# Patient Record
Sex: Male | Born: 1945 | Race: White | Hispanic: No | Marital: Married | Smoking: Former smoker
Health system: Southern US, Community
[De-identification: ages and names within clinical notes are randomized; demographics above are authoritative.]

## PROBLEM LIST (undated history)

## (undated) DIAGNOSIS — K7689 Other specified diseases of liver: Secondary | ICD-10-CM

## (undated) DIAGNOSIS — I25119 Atherosclerotic heart disease of native coronary artery with unspecified angina pectoris: Secondary | ICD-10-CM

## (undated) DIAGNOSIS — N390 Urinary tract infection, site not specified: Secondary | ICD-10-CM

## (undated) DIAGNOSIS — I700 Atherosclerosis of aorta: Secondary | ICD-10-CM

## (undated) DIAGNOSIS — E785 Hyperlipidemia, unspecified: Secondary | ICD-10-CM

## (undated) DIAGNOSIS — I219 Acute myocardial infarction, unspecified: Secondary | ICD-10-CM

## (undated) DIAGNOSIS — K219 Gastro-esophageal reflux disease without esophagitis: Secondary | ICD-10-CM

## (undated) DIAGNOSIS — K824 Cholesterolosis of gallbladder: Secondary | ICD-10-CM

## (undated) DIAGNOSIS — N400 Enlarged prostate without lower urinary tract symptoms: Secondary | ICD-10-CM

## (undated) DIAGNOSIS — G4733 Obstructive sleep apnea (adult) (pediatric): Secondary | ICD-10-CM

## (undated) DIAGNOSIS — K2270 Barrett's esophagus without dysplasia: Secondary | ICD-10-CM

## (undated) DIAGNOSIS — F528 Other sexual dysfunction not due to a substance or known physiological condition: Secondary | ICD-10-CM

## (undated) DIAGNOSIS — I4949 Other premature depolarization: Secondary | ICD-10-CM

## (undated) DIAGNOSIS — M1990 Unspecified osteoarthritis, unspecified site: Secondary | ICD-10-CM

## (undated) DIAGNOSIS — E783 Hyperchylomicronemia: Secondary | ICD-10-CM

## (undated) DIAGNOSIS — R718 Other abnormality of red blood cells: Secondary | ICD-10-CM

## (undated) DIAGNOSIS — G4700 Insomnia, unspecified: Secondary | ICD-10-CM

## (undated) DIAGNOSIS — D649 Anemia, unspecified: Secondary | ICD-10-CM

## (undated) DIAGNOSIS — N508 Other specified disorders of male genital organs: Secondary | ICD-10-CM

## (undated) DIAGNOSIS — M25529 Pain in unspecified elbow: Secondary | ICD-10-CM

## (undated) DIAGNOSIS — Z8661 Personal history of infections of the central nervous system: Secondary | ICD-10-CM

## (undated) DIAGNOSIS — I10 Essential (primary) hypertension: Secondary | ICD-10-CM

## (undated) DIAGNOSIS — I77810 Thoracic aortic ectasia: Secondary | ICD-10-CM

## (undated) DIAGNOSIS — R32 Unspecified urinary incontinence: Secondary | ICD-10-CM

## (undated) DIAGNOSIS — J309 Allergic rhinitis, unspecified: Secondary | ICD-10-CM

## (undated) DIAGNOSIS — Z8679 Personal history of other diseases of the circulatory system: Secondary | ICD-10-CM

## (undated) DIAGNOSIS — Z9989 Dependence on other enabling machines and devices: Secondary | ICD-10-CM

## (undated) HISTORY — DX: Other specified disorders of male genital organs: N50.8

## (undated) HISTORY — DX: Benign prostatic hyperplasia without lower urinary tract symptoms: N40.0

## (undated) HISTORY — PX: CORONARY ANGIOPLASTY WITH STENT PLACEMENT: SHX49

## (undated) HISTORY — DX: Other sexual dysfunction not due to a substance or known physiological condition: F52.8

## (undated) HISTORY — DX: Other premature depolarization: I49.49

## (undated) HISTORY — DX: Urinary tract infection, site not specified: N39.0

## (undated) HISTORY — PX: INGUINAL HERNIA REPAIR: SUR1180

## (undated) HISTORY — DX: Hyperchylomicronemia: E78.3

## (undated) HISTORY — DX: Insomnia, unspecified: G47.00

## (undated) HISTORY — DX: Allergic rhinitis, unspecified: J30.9

## (undated) HISTORY — DX: Personal history of infections of the central nervous system: Z86.61

## (undated) HISTORY — DX: Thoracic aortic ectasia: I77.810

## (undated) HISTORY — PX: BACK SURGERY: SHX140

## (undated) HISTORY — DX: Other abnormality of red blood cells: R71.8

## (undated) HISTORY — DX: Pain in unspecified elbow: M25.529

## (undated) HISTORY — PX: TONSILLECTOMY AND ADENOIDECTOMY: SUR1326

## (undated) HISTORY — PX: CORONARY ANGIOPLASTY: SHX604

## (undated) HISTORY — DX: Unspecified urinary incontinence: R32

## (undated) HISTORY — DX: Essential (primary) hypertension: I10

## (undated) HISTORY — DX: Hyperlipidemia, unspecified: E78.5

---

## 1982-09-19 HISTORY — PX: ANKLE FRACTURE SURGERY: SHX122

## 1987-09-20 HISTORY — PX: CORONARY ARTERY BYPASS GRAFT: SHX141

## 1994-02-17 DIAGNOSIS — I219 Acute myocardial infarction, unspecified: Secondary | ICD-10-CM

## 1994-02-17 HISTORY — DX: Acute myocardial infarction, unspecified: I21.9

## 1998-08-17 ENCOUNTER — Ambulatory Visit (HOSPITAL_BASED_OUTPATIENT_CLINIC_OR_DEPARTMENT_OTHER): Admission: RE | Admit: 1998-08-17 | Discharge: 1998-08-17 | Payer: Self-pay | Admitting: Surgery

## 1998-10-06 ENCOUNTER — Encounter: Payer: Self-pay | Admitting: Cardiology

## 1998-10-06 ENCOUNTER — Inpatient Hospital Stay (HOSPITAL_COMMUNITY): Admission: EM | Admit: 1998-10-06 | Discharge: 1998-10-08 | Payer: Self-pay | Admitting: Emergency Medicine

## 2000-07-20 ENCOUNTER — Ambulatory Visit (HOSPITAL_COMMUNITY): Admission: RE | Admit: 2000-07-20 | Discharge: 2000-07-21 | Payer: Self-pay | Admitting: Cardiology

## 2001-06-25 ENCOUNTER — Inpatient Hospital Stay (HOSPITAL_COMMUNITY): Admission: EM | Admit: 2001-06-25 | Discharge: 2001-06-27 | Payer: Self-pay | Admitting: *Deleted

## 2001-06-25 ENCOUNTER — Encounter: Payer: Self-pay | Admitting: *Deleted

## 2002-03-27 ENCOUNTER — Encounter: Admission: RE | Admit: 2002-03-27 | Discharge: 2002-03-27 | Payer: Self-pay | Admitting: Orthopaedic Surgery

## 2002-03-27 ENCOUNTER — Encounter: Payer: Self-pay | Admitting: Orthopaedic Surgery

## 2002-04-08 ENCOUNTER — Encounter: Admission: RE | Admit: 2002-04-08 | Discharge: 2002-04-08 | Payer: Self-pay | Admitting: Orthopaedic Surgery

## 2002-04-08 ENCOUNTER — Encounter: Payer: Self-pay | Admitting: Orthopaedic Surgery

## 2002-04-22 ENCOUNTER — Encounter: Admission: RE | Admit: 2002-04-22 | Discharge: 2002-04-22 | Payer: Self-pay | Admitting: Orthopaedic Surgery

## 2002-04-22 ENCOUNTER — Encounter: Payer: Self-pay | Admitting: Orthopaedic Surgery

## 2002-07-05 ENCOUNTER — Ambulatory Visit (HOSPITAL_COMMUNITY): Admission: RE | Admit: 2002-07-05 | Discharge: 2002-07-05 | Payer: Self-pay | Admitting: Cardiology

## 2002-07-05 ENCOUNTER — Encounter: Payer: Self-pay | Admitting: Cardiology

## 2003-11-20 HISTORY — PX: COLONOSCOPY W/ BIOPSIES AND POLYPECTOMY: SHX1376

## 2003-11-20 LAB — HM COLONOSCOPY

## 2004-03-08 ENCOUNTER — Observation Stay (HOSPITAL_COMMUNITY): Admission: EM | Admit: 2004-03-08 | Discharge: 2004-03-09 | Payer: Self-pay | Admitting: *Deleted

## 2004-07-29 ENCOUNTER — Ambulatory Visit: Payer: Self-pay | Admitting: Cardiology

## 2004-10-07 ENCOUNTER — Ambulatory Visit: Payer: Self-pay | Admitting: Internal Medicine

## 2004-11-18 ENCOUNTER — Ambulatory Visit: Payer: Self-pay | Admitting: Internal Medicine

## 2005-06-14 ENCOUNTER — Ambulatory Visit: Payer: Self-pay

## 2005-06-29 ENCOUNTER — Ambulatory Visit: Payer: Self-pay | Admitting: Cardiology

## 2005-08-23 ENCOUNTER — Ambulatory Visit: Payer: Self-pay | Admitting: Internal Medicine

## 2005-12-30 ENCOUNTER — Ambulatory Visit: Payer: Self-pay | Admitting: Internal Medicine

## 2006-06-14 ENCOUNTER — Ambulatory Visit: Payer: Self-pay

## 2006-06-27 ENCOUNTER — Ambulatory Visit: Payer: Self-pay | Admitting: Cardiology

## 2006-08-18 ENCOUNTER — Ambulatory Visit: Payer: Self-pay | Admitting: Internal Medicine

## 2006-09-25 ENCOUNTER — Ambulatory Visit: Payer: Self-pay | Admitting: Internal Medicine

## 2007-06-20 ENCOUNTER — Ambulatory Visit: Payer: Self-pay

## 2007-06-22 ENCOUNTER — Ambulatory Visit: Payer: Self-pay | Admitting: Cardiology

## 2007-08-20 ENCOUNTER — Ambulatory Visit: Payer: Self-pay | Admitting: Internal Medicine

## 2007-08-20 DIAGNOSIS — I10 Essential (primary) hypertension: Secondary | ICD-10-CM

## 2007-08-20 DIAGNOSIS — I4949 Other premature depolarization: Secondary | ICD-10-CM

## 2007-08-20 DIAGNOSIS — J309 Allergic rhinitis, unspecified: Secondary | ICD-10-CM

## 2007-08-20 DIAGNOSIS — E781 Pure hyperglyceridemia: Secondary | ICD-10-CM

## 2007-08-20 DIAGNOSIS — F528 Other sexual dysfunction not due to a substance or known physiological condition: Secondary | ICD-10-CM

## 2007-08-20 DIAGNOSIS — I25810 Atherosclerosis of coronary artery bypass graft(s) without angina pectoris: Secondary | ICD-10-CM | POA: Insufficient documentation

## 2007-08-20 DIAGNOSIS — N400 Enlarged prostate without lower urinary tract symptoms: Secondary | ICD-10-CM

## 2007-08-20 DIAGNOSIS — M545 Low back pain: Secondary | ICD-10-CM

## 2007-08-20 HISTORY — DX: Allergic rhinitis, unspecified: J30.9

## 2007-08-20 HISTORY — DX: Essential (primary) hypertension: I10

## 2007-08-20 HISTORY — DX: Other sexual dysfunction not due to a substance or known physiological condition: F52.8

## 2007-08-20 HISTORY — DX: Benign prostatic hyperplasia without lower urinary tract symptoms: N40.0

## 2007-08-20 HISTORY — DX: Other premature depolarization: I49.49

## 2007-09-20 HISTORY — PX: SHOULDER OPEN ROTATOR CUFF REPAIR: SHX2407

## 2008-03-17 ENCOUNTER — Ambulatory Visit: Payer: Self-pay | Admitting: Internal Medicine

## 2008-03-17 DIAGNOSIS — G4700 Insomnia, unspecified: Secondary | ICD-10-CM

## 2008-03-17 HISTORY — DX: Insomnia, unspecified: G47.00

## 2008-04-15 ENCOUNTER — Ambulatory Visit: Payer: Self-pay | Admitting: Internal Medicine

## 2008-04-15 DIAGNOSIS — M25529 Pain in unspecified elbow: Secondary | ICD-10-CM

## 2008-04-15 HISTORY — DX: Pain in unspecified elbow: M25.529

## 2008-06-23 ENCOUNTER — Ambulatory Visit: Payer: Self-pay

## 2008-07-01 ENCOUNTER — Ambulatory Visit: Payer: Self-pay | Admitting: Cardiology

## 2008-09-03 ENCOUNTER — Ambulatory Visit: Payer: Self-pay | Admitting: Cardiology

## 2008-09-03 ENCOUNTER — Encounter: Payer: Self-pay | Admitting: Physician Assistant

## 2008-09-03 DIAGNOSIS — E783 Hyperchylomicronemia: Secondary | ICD-10-CM

## 2008-09-15 ENCOUNTER — Ambulatory Visit (HOSPITAL_COMMUNITY): Admission: RE | Admit: 2008-09-15 | Discharge: 2008-09-16 | Payer: Self-pay | Admitting: Orthopedic Surgery

## 2008-10-09 ENCOUNTER — Ambulatory Visit: Payer: Self-pay | Admitting: Internal Medicine

## 2008-10-09 ENCOUNTER — Telehealth: Payer: Self-pay | Admitting: Internal Medicine

## 2008-10-09 DIAGNOSIS — N508 Other specified disorders of male genital organs: Secondary | ICD-10-CM

## 2008-10-09 DIAGNOSIS — R350 Frequency of micturition: Secondary | ICD-10-CM

## 2008-10-09 DIAGNOSIS — R32 Unspecified urinary incontinence: Secondary | ICD-10-CM | POA: Insufficient documentation

## 2008-10-09 HISTORY — DX: Other specified disorders of male genital organs: N50.8

## 2008-10-09 HISTORY — DX: Unspecified urinary incontinence: R32

## 2008-10-10 LAB — CONVERTED CEMR LAB
ALT: 30 units/L (ref 0–53)
AST: 25 units/L (ref 0–37)
Albumin: 3.8 g/dL (ref 3.5–5.2)
Alkaline Phosphatase: 73 units/L (ref 39–117)
BUN: 17 mg/dL (ref 6–23)
Basophils Absolute: 0.1 10*3/uL (ref 0.0–0.1)
Basophils Relative: 0.9 % (ref 0.0–3.0)
Bilirubin Urine: NEGATIVE
Bilirubin, Direct: 0.1 mg/dL (ref 0.0–0.3)
CO2: 28 meq/L (ref 19–32)
Calcium: 8.8 mg/dL (ref 8.4–10.5)
Chloride: 106 meq/L (ref 96–112)
Cholesterol: 123 mg/dL (ref 0–200)
Creatinine, Ser: 0.9 mg/dL (ref 0.4–1.5)
Eosinophils Absolute: 0.6 10*3/uL (ref 0.0–0.7)
Eosinophils Relative: 7.9 % — ABNORMAL HIGH (ref 0.0–5.0)
GFR calc Af Amer: 110 mL/min
GFR calc non Af Amer: 91 mL/min
Glucose, Bld: 95 mg/dL (ref 70–99)
HCT: 45.8 % (ref 39.0–52.0)
HDL: 33 mg/dL — ABNORMAL LOW (ref >39.0)
Hemoglobin: 15.4 g/dL (ref 13.0–17.0)
Ketones, ur: NEGATIVE mg/dL
LDL Cholesterol: 66 mg/dL (ref 0–99)
Leukocytes, UA: NEGATIVE
Lymphocytes Relative: 24.7 % (ref 12.0–46.0)
MCHC: 33.7 g/dL (ref 30.0–36.0)
MCV: 79.6 fL (ref 78.0–100.0)
Monocytes Absolute: 0.9 10*3/uL (ref 0.1–1.0)
Monocytes Relative: 12 % (ref 3.0–12.0)
Neutro Abs: 3.7 10*3/uL (ref 1.4–7.7)
Neutrophils Relative %: 54.5 % (ref 43.0–77.0)
Nitrite: NEGATIVE
PSA: 2.83 ng/mL (ref 0.10–4.00)
Platelets: 173 10*3/uL (ref 150–400)
Potassium: 4 meq/L (ref 3.5–5.1)
RBC: 5.75 M/uL (ref 4.22–5.81)
RDW: 12.6 % (ref 11.5–14.6)
Sodium: 140 meq/L (ref 135–145)
Specific Gravity, Urine: 1.02 (ref 1.000–1.03)
TSH: 1.17 microintl units/mL (ref 0.35–5.50)
Total Bilirubin: 0.7 mg/dL (ref 0.3–1.2)
Total CHOL/HDL Ratio: 3.7
Total Protein, Urine: NEGATIVE mg/dL
Total Protein: 6.6 g/dL (ref 6.0–8.3)
Triglycerides: 119 mg/dL (ref 0–149)
Urine Glucose: NEGATIVE mg/dL
Urobilinogen, UA: 0.2 (ref 0.0–1.0)
VLDL: 24 mg/dL (ref 0–40)
WBC: 7.1 10*3/uL (ref 4.5–10.5)
pH: 5.5 (ref 5.0–8.0)

## 2008-10-29 ENCOUNTER — Encounter: Payer: Self-pay | Admitting: Internal Medicine

## 2008-10-30 ENCOUNTER — Encounter: Admission: RE | Admit: 2008-10-30 | Discharge: 2008-12-10 | Payer: Self-pay | Admitting: Orthopedic Surgery

## 2008-10-30 ENCOUNTER — Telehealth (INDEPENDENT_AMBULATORY_CARE_PROVIDER_SITE_OTHER): Payer: Self-pay | Admitting: *Deleted

## 2008-10-30 ENCOUNTER — Encounter (INDEPENDENT_AMBULATORY_CARE_PROVIDER_SITE_OTHER): Payer: Self-pay | Admitting: *Deleted

## 2009-04-22 ENCOUNTER — Encounter: Payer: Self-pay | Admitting: Cardiology

## 2009-04-22 ENCOUNTER — Encounter: Payer: Self-pay | Admitting: Internal Medicine

## 2009-04-24 ENCOUNTER — Ambulatory Visit: Payer: Self-pay | Admitting: Internal Medicine

## 2009-04-24 DIAGNOSIS — N390 Urinary tract infection, site not specified: Secondary | ICD-10-CM

## 2009-04-24 HISTORY — DX: Urinary tract infection, site not specified: N39.0

## 2009-04-24 LAB — CONVERTED CEMR LAB
Ketones, ur: NEGATIVE mg/dL
Nitrite: NEGATIVE
Specific Gravity, Urine: 1.015 (ref 1.000–1.030)
Urobilinogen, UA: 1 (ref 0.0–1.0)
pH: 6 (ref 5.0–8.0)

## 2009-04-25 ENCOUNTER — Encounter: Payer: Self-pay | Admitting: Internal Medicine

## 2009-04-27 ENCOUNTER — Encounter: Payer: Self-pay | Admitting: Cardiology

## 2009-05-01 ENCOUNTER — Telehealth: Payer: Self-pay | Admitting: Cardiology

## 2009-05-28 ENCOUNTER — Telehealth (INDEPENDENT_AMBULATORY_CARE_PROVIDER_SITE_OTHER): Payer: Self-pay | Admitting: *Deleted

## 2009-06-01 ENCOUNTER — Ambulatory Visit: Payer: Self-pay

## 2009-06-01 ENCOUNTER — Encounter: Payer: Self-pay | Admitting: Internal Medicine

## 2009-06-23 ENCOUNTER — Ambulatory Visit: Payer: Self-pay | Admitting: Cardiology

## 2009-08-03 ENCOUNTER — Telehealth (INDEPENDENT_AMBULATORY_CARE_PROVIDER_SITE_OTHER): Payer: Self-pay | Admitting: *Deleted

## 2009-08-07 ENCOUNTER — Ambulatory Visit (HOSPITAL_BASED_OUTPATIENT_CLINIC_OR_DEPARTMENT_OTHER): Admission: RE | Admit: 2009-08-07 | Discharge: 2009-08-07 | Payer: Self-pay | Admitting: Urology

## 2009-08-07 HISTORY — PX: CYSTOSCOPY: SUR368

## 2009-08-19 ENCOUNTER — Ambulatory Visit: Payer: Self-pay | Admitting: Internal Medicine

## 2009-08-19 DIAGNOSIS — R58 Hemorrhage, not elsewhere classified: Secondary | ICD-10-CM | POA: Insufficient documentation

## 2009-10-02 ENCOUNTER — Ambulatory Visit (HOSPITAL_BASED_OUTPATIENT_CLINIC_OR_DEPARTMENT_OTHER): Admission: RE | Admit: 2009-10-02 | Discharge: 2009-10-03 | Payer: Self-pay | Admitting: Urology

## 2009-10-02 HISTORY — PX: TRANSURETHRAL RESECTION OF PROSTATE: SHX73

## 2010-01-27 ENCOUNTER — Encounter: Payer: Self-pay | Admitting: Cardiology

## 2010-01-28 ENCOUNTER — Telehealth (INDEPENDENT_AMBULATORY_CARE_PROVIDER_SITE_OTHER): Payer: Self-pay | Admitting: *Deleted

## 2010-02-22 ENCOUNTER — Encounter: Payer: Self-pay | Admitting: Internal Medicine

## 2010-02-25 ENCOUNTER — Encounter: Payer: Self-pay | Admitting: Cardiology

## 2010-03-08 ENCOUNTER — Telehealth: Payer: Self-pay | Admitting: Cardiology

## 2010-03-08 ENCOUNTER — Encounter: Payer: Self-pay | Admitting: Cardiovascular Disease

## 2010-06-03 ENCOUNTER — Telehealth (INDEPENDENT_AMBULATORY_CARE_PROVIDER_SITE_OTHER): Payer: Self-pay | Admitting: *Deleted

## 2010-06-10 ENCOUNTER — Telehealth (INDEPENDENT_AMBULATORY_CARE_PROVIDER_SITE_OTHER): Payer: Self-pay | Admitting: *Deleted

## 2010-06-14 ENCOUNTER — Ambulatory Visit: Payer: Self-pay | Admitting: Internal Medicine

## 2010-06-14 ENCOUNTER — Encounter: Payer: Self-pay | Admitting: Internal Medicine

## 2010-06-14 ENCOUNTER — Ambulatory Visit: Payer: Self-pay

## 2010-06-14 ENCOUNTER — Encounter (HOSPITAL_COMMUNITY): Admission: RE | Admit: 2010-06-14 | Discharge: 2010-08-23 | Payer: Self-pay

## 2010-06-22 ENCOUNTER — Encounter: Payer: Self-pay | Admitting: Cardiology

## 2010-06-24 ENCOUNTER — Telehealth: Payer: Self-pay | Admitting: Internal Medicine

## 2010-06-25 ENCOUNTER — Ambulatory Visit: Payer: Self-pay | Admitting: Cardiology

## 2010-06-28 ENCOUNTER — Encounter: Payer: Self-pay | Admitting: Internal Medicine

## 2010-07-29 ENCOUNTER — Telehealth: Payer: Self-pay | Admitting: Cardiology

## 2010-09-19 HISTORY — PX: SURGERY SCROTAL / TESTICULAR: SUR1316

## 2010-09-19 HISTORY — PX: FOOT TENDON SURGERY: SHX958

## 2010-10-21 NOTE — Letter (Signed)
Summary: [facility] Office Note  [facility] Office Note   Imported By: Roderic Ovens 02/25/2010 15:49:59  _____________________________________________________________________  External Attachment:    Type:   Image     Comment:   External Document

## 2010-10-21 NOTE — Progress Notes (Signed)
Summary: clearance to be off plavix and asa   Phone Note From Other Clinic   Caller: Glendon Axe   [phone] ext [phone]   fax [phone] Summary of Call: Glendon Axe w/[name] called stating pt is sch for ankle surgery on 6/28 and they need clearance for him to be off of Plavix and ASA for 5 days prior, advised Dr Juanda Chance is on vaction will send to DOD Dr Shirlee Latch for review  Initial call taken by: Meredith Staggers, RN,  March 08, 2010 2:39 PM  Follow-up for Phone Call        Discussed info and chart reviewed by Dr Excell Seltzer, ok for pt to come off Plavix 5 days prior, would prefer pt cont. ASA if possible, LuAnne aware note faxed  Follow-up by: Meredith Staggers, RN,  March 08, 2010 2:45 PM

## 2010-10-21 NOTE — Miscellaneous (Signed)
  Clinical Lists Changes  Observations: Added new observation of NUCLEAR NOS:  Overall Impression   Exercise Capacity: Lexiscan with no exercise. BP Response: Normal blood pressure response. Clinical Symptoms: Mild chest pain/dyspnea. ECG Impression: No significant ST segment change suggestive of ischemia. Overall Impression: Probable normal perfusion and soft tissue attenuation, cannot exclude subendocardial scar.  No evidence for ischemia.  (06/14/2010 10:28)      Nuclear Study  Procedure date:  06/14/2010  Findings:       Overall Impression   Exercise Capacity: Lexiscan with no exercise. BP Response: Normal blood pressure response. Clinical Symptoms: Mild chest pain/dyspnea. ECG Impression: No significant ST segment change suggestive of ischemia. Overall Impression: Probable normal perfusion and soft tissue attenuation, cannot exclude subendocardial scar.  No evidence for ischemia.

## 2010-10-21 NOTE — Progress Notes (Signed)
Summary: Nuc Pre-Procedure  Phone Note Outgoing Call Call back at [facility] Phone [phone]   Call placed by: Antionette Char RN,  June 03, 2010 4:13 PM Call placed to: Patient Reason for Call: Confirm/change Appt Summary of Call: Left message with information on Myoview Information Sheet (see scanned document for details).     Nuclear Med Background Indications for Stress Test: Evaluation for Ischemia, Graft Patency, Stent Patency  Indications Comments: Pending   History: Asthma, CABG, Heart Catheterization, Myocardial Infarction, Myocardial Perfusion Study, Stents  History Comments: '89 CABG x3  '95 MI 9/10 MPS Normal  Symptoms: Palpitations    Nuclear Pre-Procedure Cardiac Risk Factors: Family History - CAD, History of Smoking, Hypertension, Lipids Height (in): 71  Nuclear Med Study Referring MD:  B.Juanda Chance

## 2010-10-21 NOTE — Medication Information (Signed)
Summary: Physician's Orders   Physician's Orders   Imported By: Roderic Ovens 03/17/2010 14:32:39  _____________________________________________________________________  External Attachment:    Type:   Image     Comment:   External Document

## 2010-10-21 NOTE — Progress Notes (Signed)
Summary: Schedule office visit to discuss colonoscopy   Phone Note Outgoing Call Call back at Home Phone [phone]   Call placed by: Christie Nottingham CMA Duncan Dull),  Jan 28, 2010 2:56 PM Call placed to: Patient Summary of Call: Called pt to schedule office visit to discuss recall colonoscopy and pt states he just had surgery this year and wants have some of his medical problems worked out before he comes into our office but will call back to schedule offic visit.  Initial call taken by: Christie Nottingham CMA Duncan Dull),  Jan 28, 2010 2:58 PM

## 2010-10-21 NOTE — Letter (Signed)
Summary: Dept of [facility]  Dept of [facility]   Imported By: Marylou Mccoy 07/09/2010 14:22:22  _____________________________________________________________________  External Attachment:    Type:   Image     Comment:   External Document

## 2010-10-21 NOTE — Miscellaneous (Signed)
Summary: Appointment Canceled  Appointment status changed to canceled by [employer] on 06/04/2010 2:06 PM.  Cancellation Comments --------------------- aden r/s myoview/hm  Appointment Information ----------------------- Appt Type:  CARDIOLOGY NUCLEAR TESTING      Date:  Monday, June 07, 2010      Time:  7:30 AM for 15 min   Urgency:  Routine   Made By:  Pearson Grippe  To Visit:  [id number]    Reason:  aden r/s myoview/hm  Appt Comments ------------- -- 06/04/10 14:06: ([facility]) CANCELED -- aden r/s myoview/hm -- 06/04/10 10:38: ([facility]) BOOKED -- Routine CARDIOLOGY NUCLEAR TESTING at 06/07/2010 7:30 AM for 15 min aden r/s myoview/hm -- 05/25/10 9:14: ([facility]) BOOKED -- Routine CARDIOLOGY NUCLEAR TESTING a

## 2010-10-21 NOTE — Assessment & Plan Note (Signed)
Summary: Cardiology Nuclear Testing  Nuclear Med Background Indications for Stress Test: Evaluation for Ischemia, Graft Patency, Stent Patency  Indications Comments: .  History: Angioplasty, Asthma, CABG, Heart Catheterization, Myocardial Infarction, Myocardial Perfusion Study, Stents  History Comments: 9/10 MPS Normal  Symptoms: Palpitations    Nuclear Pre-Procedure Cardiac Risk Factors: Family History - CAD, History of Smoking, Hypertension, Lipids Caffeine/Decaff Intake: None NPO After: 9:00 PM Lungs: clear IV 0.9% NS with Angio Cath: 22g     IV Site: R Antecubital IV Started by: Irean Hong, RN Chest Size (in) 46     Height (in): 71 Weight (lb): 239 BMI: 33.45 Tech Comments: Took atenolol last night.  Nuclear Med Study 1 or 2 day study:  1 day     Stress Test Type:  Treadmill/Lexiscan Reading MD:  Dietrich Pates, MD     Referring MD:  B.Brodie Resting Radionuclide:  Technetium 62m Tetrofosmin     Resting Radionuclide Dose:  11.0 mCi  Stress Radionuclide:  Technetium 75m Tetrofosmin     Stress Radionuclide Dose:  33.0 mCi   Stress Protocol  Max Systolic BP: 156 mm Hg Lexiscan: 0.4 mg   Stress Test Technologist:  Milana Na, EMT-P     Nuclear Technologist:  Domenic Polite, CNMT  Rest Procedure  Myocardial perfusion imaging was performed at rest 45 minutes following the intravenous administration of Technetium 88m Tetrofosmin.  Stress Procedure  The patient received IV Lexiscan 0.4 mg over 15-seconds with concurrent low level exercise and then Technetium 34m Tetrofosmin was injected at 30-seconds while the patient continued walking one more minute.  There were no significant changes with Lexiscan.  Quantitative spect images were obtained after a 45 minute delay.  QPS Raw Data Images:  Soft tissue (diaphragm, subcutaneous fat) surround heart. Stress Images:  Thinning withdecreased counts in the inferior wall (base, minimally mid); inferolateral wall (base) and  apex.  Otherwise normal perfusion. Rest Images:  No significant change from the stress images. Transient Ischemic Dilatation:  1.10  (Normal <1.22)  Lung/Heart Ratio:  .32  (Normal <0.45)  Quantitative Gated Spect Images QGS EDV:  106 ml QGS ESV:  42 ml QGS EF:  60 %   Overall Impression  Exercise Capacity: Lexiscan with no exercise. BP Response: Normal blood pressure response. Clinical Symptoms: Mild chest pain/dyspnea. ECG Impression: No significant ST segment change suggestive of ischemia. Overall Impression: Probable normal perfusion and soft tissue attenuation, cannot exclude subendocardial scar.  No evidence for ischemia.  Appended Document: Cardiology Nuclear Testing Appt 10/7 with Dr. Juanda Chance.  Appended Document: Cardiology Nuclear Testing Pt made aware of his results at his office visit today.

## 2010-10-21 NOTE — Progress Notes (Signed)
Summary:  anti-inflammatory meds   Phone Note From Other Clinic Call back at [facility] Phone [phone]   Caller: Patient Caller: beth office [phone] Request: Talk with Nurse Details of Complaint: pt need clearance - for anti inflammatory meds- for hip arthritic pt on plavix. pls advise.  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 They are requesting that Dr.Caragh Gasper review and call them back on 11/14 with a recommendation. I offered to speak with the DOD but they were OK with waiting to speak with Dr.Casmere Hollenbeck next week.  Layne Benton, RN, BSN  July 29, 2010 11:14 AM   Additional Follow-up for Phone Call Additional follow up Details #1::        I discussed the above with Dr. Juanda Chance. Per Dr. Juanda Chance- he would recommend celebrex due to less GI side effects, but if cost is an issure, indocin is ok. I will contact Dr. Jeannetta [name] office tomorrow. Additional Follow-up by: Sherri Rad, RN, BSN,  August 04, 2010 6:18 PM    Additional Follow-up for Phone Call Additional follow up Details #2::    I called and spoke with Tammy at Dr. Jeannetta [name] office and made her aware of Dr. Regino Schultze recommendations regarding celebrex and indocin. She verbalizes understanding.  Follow-up by: Sherri Rad, RN, BSN,  August 05, 2010 10:31 AM

## 2010-10-21 NOTE — Letter (Signed)
Summary: [facility]   Imported By: Lester Farnham 07/13/2010 08:31:50  _____________________________________________________________________  External Attachment:    Type:   Image     Comment:   External Document

## 2010-10-21 NOTE — Letter (Signed)
Summary: [facility]   Imported By: Sherian Rein 02/25/2010 13:14:09  _____________________________________________________________________  External Attachment:    Type:   Image     Comment:   External Document

## 2010-10-21 NOTE — Assessment & Plan Note (Signed)
Summary: f1y  Medications Added SIMVASTATIN 80 MG TABS (SIMVASTATIN) Take 1/2  tablet by mouth daily at bedtime AMITRIPTYLINE HCL 25 MG TABS (AMITRIPTYLINE HCL) take one tab by mouth once daily PROAIR HFA 108 (90 BASE) MCG/ACT  AERS (ALBUTEROL SULFATE) use as needed      Allergies Added: ! * METOPROLOL  Visit Type:  Follow-up- 1 year Primary Javious Hallisey:  Corwin Levins MD  CC:  No cardiac complaints. Pt. is pending an appt. on monday with Dr. Rayburn Ma for possible hernia surgery.Marland Kitchen  History of Present Illness: Mr. Iyer is 65 years old and return for management of CAD. He had bypass surgery in 1989 in [place]. He had a DMI sometime after that treated with PTCA of the saphenous vein graft to the right coronary artery. In 1996 he had a stent to the saphenous vein graft to the right coronary and in 2000 he had another stent for peri-stent restenosis. His last catheterization was in 2005 at which time he had no major obstructive disease.  He has done well since that time has had no symptoms of chest pain chores breath or palpitations. Had a Myoview scan prior to this visit which showed no ischemia and an ejection fraction of 60%.  His other problems include hypertension hyperlipidemia.  He has a hernia that he thinks will need repair and he is scheduled to see Dr. Magnus Ivan next week.  Current Medications (verified): 1)  Clinoril 200 Mg  Tabs (Sulindac) .Marland Kitchen.. 1 By Mouth Bid 2)  Plavix 75 Mg  Tabs (Clopidogrel Bisulfate) .Marland Kitchen.. 1 By Mouth Qd 3)  Atenolol 25 Mg  Tabs (Atenolol) .Marland Kitchen.. 1 By Mouth Qd 4)  Simvastatin 80 Mg Tabs (Simvastatin) .... Take 1/2  Tablet By Mouth Daily At Bedtime 5)  Omeprazole 20 Mg  Tbec (Omeprazole) .Marland Kitchen.. 1 By Mouth Qd 6)  Ecotrin Low Strength 81 Mg  Tbec (Aspirin) .Marland Kitchen.. 1po Qd 7)  Amitriptyline Hcl 25 Mg Tabs (Amitriptyline Hcl) .... Take One Tab By Mouth Once Daily 8)  Proair Hfa 108 (90 Base) Mcg/act  Aers (Albuterol Sulfate) .... Use As Needed 9)  Temazepam 30 Mg   Caps (Temazepam) .... Take As Needed For Sleep  Allergies (verified): 1)  ! * Metoprolol  Past History:  Past Medical History: Reviewed history from 09/03/2008 and no changes required. Coronary artery disease CABG 1989 [place] DMI treated PTCA distal SVG-RCA 1995 Stent ostium SVG-RCA 1996 PTCA ostial SvG-RCA for restenosis 2000 Stent native CFX 2001 Last cath 2002 nonobstructive disease inferobasal HK EF 60% Hyperlipidemia Hypertension DJD E.D. hx of symptomatic pvc's Myocardial infarction, hx of - 1995 Allergic rhinitis Benign prostatic hypertrophy hx of meningitis Low back pain - hx of TENS use  Review of Systems       ROS is negative except as outlined in HPI.   Vital Signs:  Patient profile:   65 year old male Height:      72 inches Weight:      241.50 pounds Pulse rate:   55 / minute Pulse rhythm:   regular BP sitting:   130 / 78  (left arm) Cuff size:   large  Vitals Entered By: Sherri Rad, RN, BSN (June 25, 2010 8:49 AM)  Physical Exam  Additional Exam:  Gen. Well-nourished, in no distress   Neck: No JVD, thyroid not enlarged, no carotid bruits Lungs: No tachypnea, clear without rales, rhonchi or wheezes Cardiovascular: Rhythm regular, PMI not displaced,  heart sounds  normal, no murmurs or gallops, no peripheral  edema, pulses normal in all 4 extremities. Abdomen: BS normal, abdomen soft and non-tender without masses or organomegaly, no hepatosplenomegaly. MS: No deformities, no cyanosis or clubbing   Neuro:  No focal sns   Skin:  no lesions    Impression & Recommendations:  Problem # 1:  CORONARY ARTERY DISEASE (ICD-414.00) He had bypass surgery in 1989 and multiple PCI procedures. He had a recent negative Myoview scan and has had no symptoms. His thumb appears stable. His updated medication list for this problem includes:    Plavix 75 Mg Tabs (Clopidogrel bisulfate) .Marland Kitchen... 1 by mouth qd    Atenolol 25 Mg Tabs (Atenolol) .Marland Kitchen... 1 by mouth  qd    Ecotrin Low Strength 81 Mg Tbec (Aspirin) .Marland Kitchen... 1po qd  Problem # 2:  PREOPERATIVE EXAMINATION (ICD-V72.84) He is scheduled to see Dr. Magnus Ivan next week for possible hernia surgery. His cardiac condition is stable I think he is okay from a cardiac standpoint to proceed with this. It is been a long time since he had stents implanted and I think it is okay for him to come off Plavix 5 days prior to the surgery. I would prefer that he stay on aspirin through the surgery.  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 His total cholesterol was 172, his LDL was 105, his HDL was 46. He's not quite at target with his LDL and we will encourage tightening up on the diet. His updated medication list for this problem includes:    Simvastatin 80 Mg Tabs (Simvastatin) .Marland Kitchen... Take 1/2  tablet by mouth daily at bedtime  Problem # 4:  HYPERTENSION (ICD-401.9) His blood pressure is well controlled on current medications. His updated medication list for this problem includes:    Atenolol 25 Mg Tabs (Atenolol) .Marland Kitchen... 1 by mouth qd    Ecotrin Low Strength 81 Mg Tbec (Aspirin) .Marland Kitchen... 1po qd  Other Orders: EKG w/ Interpretation (93000)  Patient Instructions: 1)  Your physician recommends that you continue on your current medications as directed. Please refer to the Current Medication list given to you today. 2)  Your physician wants you to follow-up in: 1 year with Dr. Excell Seltzer.  You will receive a reminder letter in the mail two months in advance. If you don't receive a letter, please call our office to schedule the follow-up appointment.

## 2010-10-21 NOTE — Progress Notes (Signed)
  Phone Note Call from Patient Call back at Home Phone [phone]   Summary of Call: Patient is requesting referral to a surgeon for hernia repair.  Initial call taken by: Lamar Sprinkles, CMA,  June 24, 2010 11:10 AM  Follow-up for Phone Call        not seen since dec 2010  needs OV  - not sure if this is needed or not Follow-up by: Corwin Levins MD,  June 24, 2010 1:35 PM  Additional Follow-up for Phone Call Additional follow up Details #1::        Pt states he did not ask for referral and he "found out what he needed to know" Additional Follow-up by: Margaret Pyle, CMA,  June 24, 2010 1:43 PM

## 2010-10-21 NOTE — Progress Notes (Signed)
Summary: Nuc Pre-Procedure  Phone Note Outgoing Call Call back at [facility] Phone [phone]   Call placed by: Stanton Kidney, EMT-P,  June 10, 2010 2:15 PM Call placed to: Patient Action Taken: Phone Call Completed Summary of Call: Reviewed information on Myoview Information Sheet (see scanned document for further details).  Spoke with the Patient.     Nuclear Med Background Indications for Stress Test: Evaluation for Ischemia, Graft Patency, Stent Patency  Indications Comments: .  History: Angioplasty, Asthma, CABG, Heart Catheterization, Myocardial Infarction, Myocardial Perfusion Study, Stents  History Comments: 9/10 MPS Normal  Symptoms: Palpitations    Nuclear Pre-Procedure Cardiac Risk Factors: Family History - CAD, History of Smoking, Hypertension, Lipids Height (in): 71  Nuclear Med Study Referring MD:  B.Juanda Chance

## 2010-12-05 LAB — BASIC METABOLIC PANEL
CO2: 28 mEq/L (ref 19–32)
Calcium: 8.3 mg/dL — ABNORMAL LOW (ref 8.4–10.5)
GFR calc Af Amer: >60 mL/min (ref >60)
Glucose, Bld: 160 mg/dL — ABNORMAL HIGH (ref 70–99)
Potassium: 4.3 mEq/L (ref 3.5–5.1)
Sodium: 138 mEq/L (ref 135–145)

## 2010-12-05 LAB — DIFFERENTIAL
Basophils Relative: 0 % (ref 0–1)
Eosinophils Absolute: 0 10*3/uL (ref 0.0–0.7)
Eosinophils Relative: 0 % (ref 0–5)
Eosinophils Relative: 0 % (ref 0–5)
Lymphocytes Relative: 10 % — ABNORMAL LOW (ref 12–46)
Lymphs Abs: 0.9 10*3/uL (ref 0.7–4.0)
Lymphs Abs: 1.7 10*3/uL (ref 0.7–4.0)
Monocytes Absolute: 0.7 10*3/uL (ref 0.1–1.0)
Monocytes Relative: 1 % — ABNORMAL LOW (ref 3–12)
Monocytes Relative: 4 % (ref 3–12)
Neutrophils Relative %: 88 % — ABNORMAL HIGH (ref 43–77)

## 2010-12-05 LAB — CBC
HCT: 39 % (ref 39.0–52.0)
HCT: 44 % (ref 39.0–52.0)
Hemoglobin: 12.9 g/dL — ABNORMAL LOW (ref 13.0–17.0)
Hemoglobin: 14.4 g/dL (ref 13.0–17.0)
MCHC: 32.7 g/dL (ref 30.0–36.0)
MCV: 76.6 fL — ABNORMAL LOW (ref 78.0–100.0)
RBC: 5.72 MIL/uL (ref 4.22–5.81)
RDW: 15.2 % (ref 11.5–15.5)
WBC: 17.1 10*3/uL — ABNORMAL HIGH (ref 4.0–10.5)

## 2010-12-05 LAB — POCT I-STAT 4, (NA,K, GLUC, HGB,HCT)
HCT: 47 % (ref 39.0–52.0)
Hemoglobin: 16 g/dL (ref 13.0–17.0)

## 2010-12-22 LAB — POCT I-STAT 4, (NA,K, GLUC, HGB,HCT)
HCT: 49 % (ref 39.0–52.0)
Hemoglobin: 16.7 g/dL (ref 13.0–17.0)

## 2011-02-01 NOTE — Assessment & Plan Note (Signed)
[facility]                            CARDIOLOGY OFFICE NOTE   HORATIO, BERTZ                    MRN:          [id number]  DATE:06/22/2007                            DOB:          Feb 02, 1946    PRIMARY CARE PHYSICIAN:  Corwin Levins, M.D.   CLINICAL HISTORY:  Mr. Rivere is 64 years old and returned for  followup management of his coronary heart disease.  He had bypass  surgery in 1989 and had a myocardial infarction treated with PCI and  distal anastomosis with a vein graft to the right coronary artery and  subsequently had a thinning of the proximal right coronary artery and  the proximal circumflex artery.  His last catheterization was in 2005,  at which time he had a patent LIMA to LAD, a patent vein graft to the  right and a patent native circumflex artery with stents in the  circumflex and vein graft to the right.  His LV function is good.   He says he has been doing quite well over the past year with no chest  pain, shortness of breath or palpitations.  We did a Myoview scan prior  to this visit and it showed no evidence of ischemia and good LV  function.   PAST MEDICAL HISTORY:  Significant for hypertension, hyperlipidemia.   CURRENT MEDICATIONS INCLUDE:  Plavix, aspirin, Lasix, atenolol,  Monopril, Zocor, Prilosec, amitriptyline.   EXAMINATION TODAY:  His blood pressure is 125/82 and the pulse is 60 and  regular.  There was no venous distention.  The carotid pulses were full without bruits.  CHEST:  Clear without rales or rhonchi.  The cardiac rhythm was regular.  Heart sounds were normal.  There were  no murmurs or gallops.  ABDOMEN:  Soft, normal bowel sounds.  There was no hepatosplenomegaly.  Peripheral pulses are full with no peripheral edema.   IMPRESSION:  1. Coronary artery disease, status post coronary artery bypass graft      surgery in 1989, and multiple percutaneous interventions, as      described above, with  nonobstructive disease at last      catheterization in 2005 and recent negative Myoview scan.  2. Good LV function.  3. History of PVC, now quiet on atenolol.  4. Hyperlipidemia with borderline low HDL and LDL is not quite at      target.   RECOMMENDATIONS:  I think Mr. Lafavor is doing quite well.  He has lost  about 28 pounds and his risk profile is quite good with the exception of  his lipid profile, which is not quite ideal.  I  told him, if the [place] will give him either Crestor or Vytorin or adding  Zetia to his Zocor, that would be more optimal to get his LDL at target.  I will plan to see him back in 12 months.     Bruce Elvera Lennox Juanda Chance, MD, [facility]  Electronically Signed    BRB/MedQ  DD: 06/22/2007  DT: 06/22/2007  Job #: [id number]   cc:   Corwin Levins, MD

## 2011-02-01 NOTE — Assessment & Plan Note (Signed)
[facility]                            CARDIOLOGY OFFICE NOTE   NAME:Robert [name], Robert [name]                    MRN:          [id number]  DATE:07/01/2008                            DOB:          16-Dec-1945    PRIMARY CARE PHYSICIAN:  Corwin Levins, MD.  He is also seen by the [facility]  hospital in [place] [facility]   CLINICAL HISTORY:  Robert [name] is a 65 years old return for followup  management of his coronary heart disease.  He had bypass surgery in 1989  and then he had a DMI treated with PTCA of the distal anastomosis with  the vein graft to the right coronary artery.  He subsequently had  stenting of the ostium of the vein graft to the right coronary in 1996  and he had a second stent placed for in-stent restenosis in January  2000.  In 2001, he had a stent placed to the circumflex artery.  He had  nonobstructive disease and last catheterization in 2005.  He has done  quite well recently with no recent chest pain, shortness of breath, or  palpitations.  We did a Myoview scan prior to this visit, which showed  good LV function and no evidence of ischemia.   PAST MEDICAL HISTORY:  Significant for hypertension and hyperlipidemia.   CURRENT MEDICATIONS:  Include  1. Atenolol 25 mg.  2. Monopril 40 mg.  3. Zocor 40 mg.  4. Prilosec 20 mg.  5. Aspirin 81 mg.  6. Temazepam.  7. Amitriptyline.  8. Plavix 75 mg.  9. ProAir p.r.n.   PHYSICAL EXAMINATION:  VITAL SIGNS:  The blood pressure 130/80 and the  pulse 64 and regular.  NECK:  There was no venous distention.  Carotid pulses were full without  bruits.  CHEST:  Clear without rales or rhonchi.  CARDIAC: Rhythm was regular.  There were no murmurs or gallops.  ABDOMEN:  Soft, normal bowel sounds.  There is hepatosplenomegaly.  EXTREMITIES:  Peripheral pulses are full with no peripheral edema.   IMPRESSION:  1. Coronary artery disease status post coronary artery bypass graft      surgery in 1989 and multiple  percutaneous interventions as      described above with nonobstructive disease and last      catheterization in 2005.  2. Good left ventricular function.  3. Hypertension, controlled.  4. Hyperlipidemia, close to target with an HDL of 38, LDL of 87, and a      total cholesterol of 136.   RECOMMENDATIONS:  I think Robert [name] is doing well.  We gave him a flu  shot today.  We will continue same medications.  I encouraged him to  lose weight as he has gained quite a bit of weight by our scales from  218-238.  He said it has been less on his home scales.  We will plan to  see back in followup in a year.  We will do a Myoview scan and adenosine  Myoview scan prior to his visit next year as well as laboratory studies,  which might he might get it  through [facility].   ADDENDUM:  I asked Robert [name] about his parents.  His father died a  year ago in 2022-10-02.  His mother is still in the nursing home and is  visited by the 5 children on a regular basis.  She has had a previous  stroke and has some progressive dementia.     Bruce Elvera Lennox Juanda Chance, MD, [facility]  Electronically Signed    BRB/MedQ  DD: 07/01/2008  DT: 07/02/2008  Job #: [id number]

## 2011-02-01 NOTE — Assessment & Plan Note (Signed)
[facility]                            CARDIOLOGY OFFICE NOTE   NAME:Mccormac, ZOEY GILKESON                    MRN:          [id number]  DATE:09/03/2008                            DOB:          [birth year]    PRIMARY CARE PHYSICIAN:  Dr. Oliver Barre   CARDIOLOGIST:  Dr. Charlies Constable also seen by [facility] in [place].   REASON FOR VISIT:  Patient is seen today for preop cardiac clearance  before undergoing rotator cuff repair scheduled for September 15, 2008.   CLINICAL HISTORY:  This is a 65 year old married white male patient with  history of coronary artery disease status post CABG in 1989.  He had a  DMI treated with PTCA of the distal anastomosis of a vein graft to the  RCA followed by stenting of the ostium of SVG to the RCA in 1996.  He  had a second stent placed for in-stent restenosis in January 2000.  In  2001 he had stent placed to the circumflex.  He had nonobstructive  disease on his last cath in 2005 and has had normal LV function.  He  last saw Dr. Charlies Constable for yearly follow-up on July 01, 2008, at  which time he was doing well.  He had a stress Myoview performed on  June 23, 2008 that showed probable normal perfusion and soft tissue  attenuation, low-risk study, EF 61%.   The patient has done extremely well 20 years post bypass.  He works on  [employer] every day and [employer] daily.  He denies any chest pain,  palpitations, dyspnea, dyspnea on exertion, dizziness or presyncope.   ALLERGIES:  No known drug allergies.   CURRENT MEDICATIONS:  1. Sulindac 200 mg b.i.d.  2. Zocor 40 mg daily.  3. Omeprazole 20 mg daily.  4. Aspirin 81 mg daily.  5. Temazepam 30 mg daily.  6. Amitriptyline 10 mg daily.  7. Plavix 75 mg daily.  8. ProAir p.r.n.   PAST MEDICAL HISTORY:  1. Three hernia repairs.  2. Broken ankle.  3. Hyperlipidemia.  4. Hypertension.   SOCIAL HISTORY:  He is married, works on a golf course.  He does not   smoke.   REVIEW OF SYSTEMS:  Negative for dizziness or presyncopal signs or  symptoms, dyspepsia, dysphagia, nausea, vomiting, change in bowels or  melena.  CARDIOPULMONARY:  See HPI.   PHYSICAL EXAMINATION:  GENERAL:  This is a pleasant 65 year old white  male in no acute distress.  Blood pressure 130/90, pulse 72, weight 243.  NECK:  Without JVD, HJR, bruit or thyroid enlargement.  LUNGS:  Clear anterior, posterior and lateral.  HEART:  Regular rate and rhythm at 72 beats per minute, normal S1-S2.  No murmur, rub, bruit, thrill or heave noted.  ABDOMEN:  Obese.  Normoactive bowel sounds are heard throughout.  No  organomegaly, masses, lesions or abnormal tenderness.  EXTREMITIES:  Without cyanosis, clubbing or edema.  Good distal pulses.   STUDIES:  EKG normal sinus rhythm with inferior Q waves, no acute change  from prior tracings.   IMPRESSION:  1. Right rotator  cuff, needs repair, scheduled for September 15, 2008,      by Dr. Darrelyn Hillock.  2. Coronary artery disease status post CABG in 1989 at [facility] [employer]      [facility] [facility].  3. Status post MI treated with PTCA of a distal anastomosis of an SVG      to the RCA.  4. Stenting of the ostium of the SVG to the RCA in 1996.  5. Stenting for a second stent placed for in-stent restenosis in      January 2001.  6. Stent to the circumflex placed in 2001.  7. Last cath in 2005 nonobstructive disease.  8. Negative stress Myoview in 06/2008 with normal LV function.  No      evidence of ischemia.  9. Hypertension.  10.Hyperlipidemia.   PLAN:  The patient has been quite stable from a cardiac standpoint over  the past several years.  He is asymptomatic and had a negative stress  Myoview two months ago Dr. Juanda Chance feels he is safe to proceed with the  upcoming rotator cuff repair scheduled later this month.      Jacolyn Reedy, PA-C  Electronically Signed      Everardo Beals. Juanda Chance, MD, [facility]  Electronically Signed    ML/MedQ  DD: 09/03/2008  DT: 09/03/2008  Job #: [id number]   cc:   Windy Fast A. Darrelyn Hillock, M.D.

## 2011-02-01 NOTE — Op Note (Signed)
NAME:  Robert [name], Robert [name]             ACCOUNT NO.:  [id number]   MEDICAL RECORD NO.:  [id number]          PATIENT TYPE:  AMB   LOCATION:  DAY                          FACILITY:  [facility]   PHYSICIAN:  Ronald A. Gioffre, M.D.DATE OF BIRTH:  July 11, 1946   DATE OF PROCEDURE:  09/15/2008  DATE OF DISCHARGE:                               OPERATIVE REPORT   SURGEON:  Dr. Darrelyn Hillock.   ASSISTANT:  Jamelle Rushing, P.A.   PREOPERATIVE DIAGNOSIS:  Complete retracted tear of the rotator cuff  tendon, right shoulder.   POSTOPERATIVE DIAGNOSIS:  Complete retracted tear of the rotator cuff  tendon, right shoulder.   OPERATION:  1. Open acromionectomy, right shoulder.  2. Repair of a complete torn retracted rotator cuff tendon, right      shoulder, utilizing 2 PEEK anchors.  3. TissueMend graft for repair of the rotator cuff tendon, right      shoulder.   PROCEDURE:  Under general anesthesia, routine orthopedic prepping and  draping of the right upper extremity was carried out.  He had 2 grams IV  Ancef.  Small incision was made over the anterior aspect of the right  shoulder.  Bleeders were identified and cauterized.  Tendon was  separated from the deltoid from the acromion in usual fashion.  I  developed a small incision down into the proximal deltoid, inserted the  cerebellar retractor, and identified a severe impingement-type of  problem.  I protected the rotator cuff with the Bennett retractor and  did a partial acromionectomy and acromioplasty in usual fashion.  Following that, I utilized the bur to even out the undersurface of the  acromion.  I then went down and identified the tear.  I burred the  lateral articular surface of the humerus in usual fashion until I had  good bleeding bone.  I then inserted 2 PEEK anchors, a total of 4  sutures.  Those were brought up into the rotator cuff tendon to advance  the tendon back to the bone and tied in place.  Once this was done, I  reinforced repair  with a TissueMend graft utilizing the remaining suture  and the anchor to do this distally, and I sutured the proximal part with  Ethibond suture.  I thoroughly irrigated out the area.  I reapproximated  deltoid tendon and muscle in the usual fashion.  Subcutaneous was closed  with 0 Vicryl, skin with metal staples.  Sterile Neosporin dressing was  applied.  He was placed in a shoulder immobilizer with a side pillow.           ______________________________  Georges Lynch Darrelyn Hillock, M.D.     RAG/MEDQ  D:  09/15/2008  T:  09/16/2008  Job:  [id number]   cc:   Everardo Beals. Juanda Chance, MD, [facility]  [address]  [place], [place] [address]

## 2011-02-04 NOTE — Cardiovascular Report (Signed)
[facility]  Patient:    Robert [name], [name]                    MRN: [id number] Proc. Date: 07/20/00 Adm. Date:  [id number] Disc. Date: [id number] Attending:  Lenoria Farrier CC:         Sonda Primes, M.D. [facility]  Bruce R. Juanda Chance, M.D. [facility]  Cardiopulmonary Laboratory   Cardiac Catheterization  CLINICAL HISTORY:  Robert [name] is 65 years old and had coronary artery bypass surgery performed in 1989.  In June of 1995 he had a DMI treated with PTCA of the distal anastomotic site of the vein graft to the right coronary artery and in 1996 he underwent stent placement at the ostium of the vein graft to the right coronary artery, and in January of 2000 he had PTCA for a focal in-stent re-stenosis in the stent in the ostial portion of the vein graft to the right coronary artery with placement of a short stent at the articulation site of a previous Palmaz-Schatz stent.  Recently he had a Cardiolite scan done for screening purposes which showed inferoapical wall ischemia and he is brought in for catheterization.  DESCRIPTION OF PROCEDURE:  The procedure was performed via the right femoral artery using an arterial sheath and 6 French preformed coronary catheters. After completion of the diagnostic study we made a decision to proceed with angioplasty of the circumflex artery which appeared to supply the inferoapical portion.  This was estimated at 80% where previously it appeared about 70%. The patient was given weight-adjusted heparin to prolong the ACT to greater than 200 seconds and was given double bolus Integrilin and infusion.  We used a [place] 7 [place] guiding catheter and a Trooper wire.  We crossed the lesion with a wire without difficulty.  We direct stented with a 2.5 x 18 NIR Elite stent deploying this with one inflation at 14 atmospheres for 36 seconds.  We then attempted to postdilate with multiple balloons but could not pass the balloon pass  the proximal portion of the stent.  We recrossed the stent with the wire on several occasions and tried several different wires.  Finally we were able to prolapse the wire with the help of using a Maverick balloon for support and then we were able to advance the Maverick balloon into the stent. We deployed this with one inflation of 12 atmospheres for 33 seconds.  Repeat diagnostic studies were then performed through the guiding catheter.  During one pass the wire came back and the balloon was inadvertently advanced out into the left main.  The LAD was completely occluded at the ostium from many prior previous studies.  This resulted in a very small tear at the site of the previous totally occluded left anterior descending artery.  For this reason we made a decision to stop the Integrilin at the end of the procedure.  Although the procedure was long, the patient tolerated the procedure quite well and left the laboratory in satisfactory condition.  RESULTS:  The aortic pressure was 126/77 with a mean of 98.  Left ventricular pressure was 126/27.  The left main coronary artery:  The left main coronary artery was free of significant disease.  Left anterior descending:  The left anterior descending artery was completely occluded at its origin.  Circumflex artery:  The circumflex artery gave rise to a very small intermediate and a very small marginal branch and then a large marginal branch.  There was 80% stenosis in the proximal circumflex artery before the large marginal branch.  This was slightly worse than on the previous study.  Right coronary artery:  The right coronary had an 80% stenosis in its midportion and was completely occluded in the distal portion.  The saphenous vein graft to the distal right coronary artery was patent. There was 50% narrowing at the stent site at the ostium of the vein graft. The distal vessel supplied a posterior descending and three  posterolateral branches which were free of significant disease.  The saphenous vein graft to the diagonal branch of the LAD was patent.  There was 40% ostial stenosis and 50% stenosis at the anastomotic site.  The vein graft also fed the LAD and there was 50% narrowing at the ostium of the diagonal branch where it took off from the LAD.  The LIMA graft to the mid LAD was patent and functioned normally.  The distal LAD was free of significant disease.  LEFT VENTRICULOGRAPHY:  The left ventriculogram was performed in the RAO projection showed slight hypokinesis of the inferobasal segment.  The overall wall motion was good and the estimated ejection fraction was 55%.  Following stenting of the circumflex artery the stenosis improved from 80% to 0%.  CONCLUSIONS: 1. Coronary artery disease, status post coronary artery bypass graft surgery    and status post multiple percutaneous interventions with total occlusion    of the left anterior descending and right coronary artery and 80%    stenosis in the proximal circumflex artery.  The vein grafts show a 50%    narrowing at the stent site in the ostium of the vein graft to the    right coronary artery, 40% ostial and 50% anastomotic stenosis in the    vein graft to the diagonal branch of the left anterior descending and    a patent left internal mammary artery to the left anterior descending.    Left ventriculogram shows inferobasal wall hypokinesis. 2. Successful stent deployment in the proximal circumflex artery with    improvement in percent diameter narrowing from 80% to 0%.  DISPOSITION:  The patient was returned to the postangioplasty unit for further observation. DD:  07/20/00 TD:  07/21/00 Job: [id number]

## 2011-02-04 NOTE — H&P (Signed)
Robert [name], Robert [name]                       ACCOUNT NO.:  [id number]   MEDICAL RECORD NO.:  [id number]                   PATIENT TYPE:  INP   LOCATION:  1843                                 FACILITY:  [facility]   PHYSICIAN:  Olga Millers, M.D. [facility]            DATE OF BIRTH:  [birth year]   DATE OF ADMISSION:  03/08/2004  DATE OF DISCHARGE:                                HISTORY & PHYSICAL   PRIMARY CARDIOLOGIST:  Dr. Charlies Constable   ADMITTING CARDIOLOGIST:  Dr. Olga Millers   PRIMARY PHYSICIAN:  Dr. Oliver Barre   CHIEF COMPLAINT:  Chest pain.   Robert [name] is a 65 year old male patient with a known history of coronary  artery disease who underwent bypass surgery in 1989 at [facility].  He has had multiple interventions since that time.  His bypass  grafts include a saphenous vein graft to the right coronary artery and LIMA  to the LAD which on last catheterization were patent.  He also has a  saphenous vein graft to the diagonal which had been occluded and this has  been known.  Apparently, he exercised this a.m. without problems but about  10:30 he felt weak and then developed neck discomfort which transmitted into  his chest.  He has had intermittent pain that has waxed and waned in  intensity since that time.  He took his blood pressure and pulse over the  past several hours with the lowest pulse rate at 48, highest blood pressure  144/87.  This was accompanied by dizziness and he felt strange, similar to  all other admissions prior to his myocardial infarction/stent.  He continues  to still feel funny along with some chest discomfort, occasional  palpitations, and dyspnea.  He does state that he was presyncopal prior to  admission.   PAST MEDICAL HISTORY:  1. CAD, as above.  2. Hypercholesterolemia, treated.  3. History of three inguinal hernia repairs.  4. History of left ankle fracture requiring surgery.   ALLERGIES:  BETA-BLOCKER apparently causes  substantial bradycardia.   MEDICATIONS:  1. Zocor 40 mg q.h.s.  2. Monopril 20 mg a day.  3. Plavix 75 mg a day.  4. Baby aspirin 81 mg a day.   SOCIAL HISTORY:  The patient does live in [place] with his wife.  He is  retired from [employer].  He has two daughters.  He quit smoking in 1995.  He  walks and bikes for exercise.  He remains on a low-fat/low-salt diet.  His  mom is alive at age 19 and has had CHF, a permanent pacemaker implanted, and  history of CVA.  Dad alive at age 36.  He has had bypass surgery x2 and does  see Dr. Charlies Constable as well.  He also has had a defibrillator implanted by  Dr. Berton Mount.  He has had three uncles die of myocardial infarctions from  age 4 to 51 years  of age.   REVIEW OF SYSTEMS:  As above, otherwise negative.   PHYSICAL EXAMINATION:  VITAL SIGNS:  Temperature 98.5, pulse 65,  respirations 20, blood pressure 154/96, O2 saturation is 97% on 2 L.  GENERAL:  He is in no acute distress although he is anxious.  HEENT:  Grossly normal.  NECK:  No carotid or subclavian bruits.  No JVD or thyromegaly.  CHEST:  Clear to auscultation bilaterally.  No wheezing or rhonchi.  HEART:  Regular rate and rhythm.  No murmur.  ABDOMEN:  Normoactive bowel sounds, nontender, nondistended, no masses, no  bruits.  EXTREMITIES:  Lower extremities:  DP/PT pulses bilaterally.  No clubbing,  cyanosis, or edema.  MUSCULOSKELETAL:  No joint deformities.  NEUROLOGIC:  Cranial nerves II-XII grossly intact.   Chest x-ray:  No acute disease.  EKG:  Rate 65 with sinus arrhythmia.  No  acute EKG changes from previous EKG's.  There is an occasional PVC.   Initial point of care markers are negative.   LABORATORY STUDIES:  Hemoglobin 16.7, hematocrit 49.  Sodium 140, potassium  4.3, BUN 19, creatinine 1.   ASSESSMENT AND PLAN:  1. Unstable angina.  2. Known coronary artery disease.  History of coronary artery bypass graft     and multiple percutaneous coronary  interventions.  3. Hyperlipidemia.  4. Former tobacco abuse.  5. Family history of coronary artery disease.   The patient will be placed in the hospital on a telemetry floor and he will  be placed on IV nitrates, IV heparin, ACE inhibitor, aspirin, and plans for  cardiac catheterization first case tomorrow with Dr. Charlies Constable.      Guy Franco, P.A. [facility]                      Olga Millers, M.D. [facility]    LB/MEDQ  D:  03/08/2004  T:  03/09/2004  Job:  [id number]

## 2011-02-04 NOTE — Cardiovascular Report (Signed)
Robert [name], TUCKEY                       ACCOUNT NO.:  [id number]   MEDICAL RECORD NO.:  [id number]                   PATIENT TYPE:  OIB   LOCATION:  2899                                 FACILITY:  [facility]   PHYSICIAN:  Charlies Constable, MD [facility]                DATE OF BIRTH:  13-Apr-1946   DATE OF PROCEDURE:  07/05/2002  DATE OF DISCHARGE:                              CARDIAC CATHETERIZATION   PROCEDURE PERFORMED:  Cardiac catheterization.   CLINICAL HISTORY:  The patient is 65 years old and had bypass surgery in  1989.  In 1995 he had DMI treated with PTCA at the distal anastomotic site  of the vein graft to the right coronary artery.  In 1996, he had a stent  placed in the ostium of the graft to the right coronary artery and he had  another stent placed in January of 2000 for a focal in-stent re-stenosis  within the articulation of the Palmaz-Schatz stent.  He subsequently had a  stent placed to the circumflex artery in November of 2001.   Recently he has had some symptoms of fatigue and we did a Cardiolite scan  which suggested possible inferior ischemia, so we brought him in for  evaluation with angiography.   DESCRIPTION OF PROCEDURE:  The procedure was performed via the right femoral  artery using an arterial sheath and 6 French preformed coronary catheters.  A front wall arterial puncture was performed and Omnipaque contrast was  used.  We used a JL5 catheter for injection of the native left coronary  artery. We used a right bypass graft catheter with side holes for injection  of the vein graft to the right coronary artery.  A distal aortogram was performed to rule out abdominal aortic aneurysm.  The  right femoral artery was closed with Perclose at the end of the procedure.  The patient tolerated the procedure well and left the laboratory in  satisfactory condition.   RESULTS:  The left main coronary artery:  The left main coronary artery was  free of significant  disease.   Left anterior descending:  The left anterior descending artery was  completely occluded at its origin.   Circumflex artery:  The circumflex artery gave rise to an atrial branch, a  small AV branch and a marginal branch. There was 30% narrowing within the  stent in the proximal circumflex artery.   Right coronary artery:  The right coronary is a moderate sized vessel that  gave rise to a right ventricular branch and was completely occluded in its  mid to distal portion. There was also an 80% proximal to mid lesion.   The saphenous vein graft to the distal right coronary artery was patent.  There was 20% narrowing at the stent site near the ostium.  There was 40%  narrowing in the distal graft near the anastomosis.  The distal right  coronary artery consisted of a  posterior descending and three posterolateral  branches, which were small in caliber but had no major obstruction.   The LIMA graft to the LAD was patent and functioned normally and the distal  LAD was free of significant disease.   The saphenous vein graft to the diagonal branch was occluded by prior study.   LEFT VENTRICULOGRAPHY:  The left ventriculogram performed in the RAO  projection showed good wall motion with no areas of hypokinesis.  The  estimated ejection fraction was 60%.   DISTAL AORTOGRAM:  A distal aortogram was performed that showed patent renal  arteries and no significant aortic obstruction.   The aortic pressure was 126/75 with a mean of 98 and left ventricular  pressure was 126/18.   CONCLUSIONS:  1. Coronary artery disease, status post coronary artery bypass graft surgery     in 1989 and status post multiple percutaneous interventions as described     above.  2. Severe native vessel disease with total occlusion of the left anterior     descending artery, total occlusion of the right coronary artery and 30%     narrowing within the stent of the circumflex artery.  3. Patent vein graft  to the distal right coronary artery with 20% narrowing     at the stent near the ostium and 40% narrowing near the distal     anastomosis and a patent left internal mammary artery graft to the left     anterior descending and occluded vein graft to the diagonal branch of the     left anterior descending by prior study.  4. Normal left ventricular function.   RECOMMENDATIONS:  Both of the patient's remaining grafts remain patent.  There is no clear source of ischemia.  Due to these findings, we will plan  to continue medical management and secondary risk factor modification.                                                     Charlies Constable, MD [facility]    BB/MEDQ  D:  07/05/2002  T:  07/06/2002  Job:  [id number]   cc:   Georgina Quint. Plotnikov, M.D. [facility]   Cardiopulmonary Laboratory

## 2011-02-04 NOTE — Assessment & Plan Note (Signed)
[facility]                         GASTROENTEROLOGY OFFICE NOTE   NAME:Robert [name], Robert [name]                    MRN:          [id number]  DATE:09/25/2006                            DOB:          [birth year]    CHIEF COMPLAINT:  Follow up of rectal bleeding thought due to  hemorrhoids.   He needed another round of suppositories and the bleeding has stopped.  There may have been some dark blood vs. change in color of his bowel  movements one time due to greens it sounds like.  Otherwise, he is well.  He was instructed to go back on his Plavix previously.  Medications were  listed and reviewed.  There are no other bowel habits changes.  See Past  Medical History of August 18, 2006, note.   PHYSICAL EXAMINATION:  Weight 242, pulse 60, blood pressure 112/76.   ASSESSMENT:  Rectal bleeding thought secondary to known internal  hemorrhoids seen at colonoscopy 2005.   PLAN:  Continued observation at this time.  If he has recurrent bleeding  he is to call me.  We may need to consider sigmoidoscopy or colonoscopy.  He is due for a routine colonoscopy in 2010 (March).     Iva Boop, MD,FACG  Electronically Signed    CEG/MedQ  DD: 09/25/2006  DT: 09/25/2006  Job #: [id number]

## 2011-02-04 NOTE — Discharge Summary (Signed)
[place]. [facility]  Patient:    NIL, XIONG Visit Number: [id number] MRN: [id number]          Service Type: MED Location: [phone] Attending Physician:  Talitha Givens Dictated by:   Pennelope Bracken, N.P. Admit Date:  06/25/2001 Discharge Date: 06/27/2001                             Discharge Summary  DISCHARGE DIAGNOSES: 1. Coronary artery disease, status post coronary artery bypass graft surgery    and subsequent percutaneous coronary intervention. 2. Cardiomegaly. 3. Hyperlipidemia.  HOSPITAL COURSE:  This delightful 65 year old white male is well-known to Dr. Everardo Beals. [place].  He has a significant history of coronary artery disease including CABG surgery in 1989, an MI in 1995, stent placements in 1996, 2001 and 2002.  He was in his usual state of good health until several [place] experienced the sudden onset of left anterior chest pressure and describes a dull ache as well as some dyspnea.  He was brought to the ED at [facility] where his condition was stabilized.  His pain was relieved with nitroglycerin.  EKG on arrival revealed a rate of 62 and normal sinus rhythm.  CBC and chemistries were within normal limits.  Patients CK was found to be elevated at 206, his troponin was 0.01, PT was 13.6, PTT was 32 and INR was 1.1.  His pulse oximetry was 97% on room air.  After evaluation and discussion with the patient, it was elected to admit him and schedule him for cardiac catheterization; this was done.  During his hospitalization, the patient denied chest pain but did have some left anterior chest soreness combined with a sharp distal sternal pain.  He was maintained on heparin and nitroglycerin drips prior to catheterization.  His PTT had risen at one point to greater than 200 and his heparin was discontinued at this point, pending reduction of this value.  The PTT did decline rapidly to a value of 101  within an hour.  His telemetry continued to reveal normal sinus rhythm and he was otherwise comfortable and asymptomatic.  Cardiac catheterization revealed normal wall motion with an ejection fraction of 65%.  Angiography revealed a normal left main.  The LAD fills well from a left internal mammary artery graft.  Left circumflex has 30% stenosis in the proximal edge of the stent but is otherwise patent.  It just gives rise to an small OM-1 which has 60% stenosis proximally.  The OM-3 has a 50% stenosis proximally.  RCA is diffusely diseased with 70% stenosis in proximal and mid-vessel.  The diagonal branch is 100% occluded, with the appearance of adequate blood flow to the area via the left internal mammary artery graft. The patient was returned to the floor and recovered uneventfully.  On day of discharge, patient was in stable condition.  Vital signs were stable, patient was afebrile.  DISCHARGE MEDICATIONS:  He is discharged to home on the following medications: 1. Plavix 75 mg one q.d.  The patient knows that he will be taking this    indefinitely. 2. Zocor 40 mg one q.d. 3. Monopril 10 mg one q.d. 4. Aspirin 81 mg one q.d.  ACTIVITY:  The patient is aware of the need to avoid strenuous activity for two days.  DISCHARGE INSTRUCTIONS:  He agrees to consume a low-fat, low-salt, low-cholesterol diet and call if  his groin wound becomes hard or painful. Patient declined a P.A. visit in one week for a groin check and is scheduled to see Dr. Juanda Chance, July 12, 2001, at 10:30.  CONDITION ON DISCHARGE:  Stable.  DISPOSITION:  The patient is discharged with his wife in stable condition and will follow up with Dr. Juanda Chance as noted. Dictated by:   Pennelope Bracken, N.P. Attending Physician:  Talitha Givens DD:  06/27/01 TD:  06/27/01 Job: [id number]

## 2011-02-04 NOTE — Cardiovascular Report (Signed)
[place]. [facility]  Patient:    Robert [name], Robert [name]                    MRN: [id number] Proc. Date: 07/20/00 Adm. Date:  [id number] Disc. Date: [id number] Attending:  Lenoria Farrier CC:         Sonda Primes, M.D. [facility]  Cardiopulmonary Lab   Cardiac Catheterization  CLINICAL HISTORY:  Mr. Septer is 65 years old and had coronary artery bypass surgery performed in 1989.  In June 1995, he had a DMI treated with PTCA of the distal anastomotic site of the vein graft to the right coronary artery. In 1996, he underwent stent placement at the ostium of the vein graft of thre right coronary artery.  In January 2000, he had PTCA for focal in-stent restenosis in the ostial portion of the vein graft to the right coronary artery with placement of a new short stent at the articulation site of a previous Palmaz-Schatz stent.  He recently had a Cardiolite scan done for screening purposes which showed infra-apical wall ischemia, and he is brought in for catheterization.  CARDIOLOGISTEverardo Beals Juanda Chance, M.D. [facility]  PROCEDURE:  Cardiac catheterization  PROCEDURE IN DETAIL:  The patient procedure was performed by the right femoral artery with arterial sheath and 6-French preformed coronary catheters.  After completion of the diagnostic study, we made the decision to proceed with angioplasty of the circumflex artery to supply the inferoapical portion.  This was estimated at 80% where previously it had appeared to be about 70%.  The patient was given weight-adjusted heparin     RESULT:   Left main coronary artery is free of disease.  Left anterior descending artery was completely occluded in its origin.  The circumflex artery had total occlusion in its marginal branch and 80% segmental disease in the mid portion of the vessel.  This vessel gave rise to two very large posterolateral branches.  The right coronary artery was completely occluded at its  origin.  The sequential saphenous vein graft to the marginal branch and the distal right coronary artery was patent and functioned normally.  There were tandem 50 and 70% distal to the anastomosis in the distal right coronary artery with segmental disease throughout this area.  The sequential saphenous vein graft to the marginal and posterolateral branch of the circumflex artery was patent and functioned normally.  The sequential LIMA graft to the diagonal branch of the LAD and the LAD was patent and functioned normally.  There was an 80% narrowing in the very distal portion of the LAD.  The left ventriculogram performed in the RAO projection showed visual fine motion with no areas of hypokinesis.  The estimated ejection fraction was 70%.  A  ______   showed patent renal arteries and no significant aorta lake obstruction.  CONCLUSION:  Coronary artery disease status post coronary artery bypass graft surgery times six.  The native vessels showed total occlusion of the right coronary artery, total occlusion of the left anterior descending artery, total occlusion of the marginal veins of the circumflex artery with 80% stenosis in the mid circumflex artery.  The grafts show a patent sequential vein graft to the marginal branch of the right coronary artery and the distal right coronary artery with 70% stenosis distal to the distal anastomoses, a patent sequential vein graft to the marginal and posterolateral branch of the circumflex artery, and a patent sequential left internal mammary artery graft to the diagonal branch  of the left anterior descending artery and the left anterior descending artery with 80% stenosis in the distal portion of the left anterior descending artery.   Left ventricular function is normal.  RECOMMENDATIONS:  It is not clear from the coronary anatomy that the patients recent symptoms were ischemic.  The lesions of the distal right coronary artery do not appear  critical.  I suspect his symptoms probably were not ischemic, but we will plan medical treatment, and I will review his medicines to see if we should make any adjustments.  We will plan discharge tomorrow. DD:  07/20/00 TD:  07/21/00 Job: [id number]

## 2011-02-04 NOTE — Cardiovascular Report (Signed)
NAME:  Robert [name], Robert [name]                       ACCOUNT NO.:  [id number]   MEDICAL RECORD NO.:  [id number]                   PATIENT TYPE:  INP   LOCATION:  3731                                 FACILITY:  [facility]   PHYSICIAN:  Charlies Constable, M.D. [facility]              DATE OF BIRTH:  April 27, 1946   DATE OF PROCEDURE:  03/09/2004  DATE OF DISCHARGE:  03/09/2004                              CARDIAC CATHETERIZATION   CLINICAL HISTORY:  Mr. Cuccia is 65 years old and has had prior bypass  surgery in 1989, prior stenting in the circumflex artery, prior percutaneous  transluminal coronary angioplasty of the distal anastomosis of the vein  graft to the right coronary artery and prior stenting on two different  occasions to the ostium of the right coronary artery.  He was admitted with  symptoms of palpitations and chest pain and enrolled in the ACUITY trial and  scheduled for evaluation with angiography.  He was randomized with heparin  and 2b3a in the lab if  needed.   PROCEDURE:  The procedure was performed via the right femoral artery using  arterial sheath and 6 French preformed coronary catheters. A LIMA catheter  was used for injection of LIMA graft.  A right bypass graft  catheter was  used for injection of the vein graft to the right coronary artery.  The  right femoral artery was closed with Angio-Seal at the end of the procedure.  The patient tolerated the procedure well and left the laboratory in  satisfactory condition.   RESULTS:  The aortic pressure was 122/78 with mean of 97 and left  ventricular pressure was 122/22.   The left main coronary artery was free of significant disease.   Left anterior descending artery:  Left anterior descending artery was  completely occluded at its origin.   Circumflex artery:  The circumflex artery gave rise to a small ramus branch  and AV branch which gave rise to a marginal branch and an atrial branch.  There was less than 10% stenosis at the  stent site in the proximal  circumflex artery and no other significant obstruction.   Right coronary artery:  The right coronary artery was completely occluded at  its mid portion.  There was an 80% proximal stenosis.   The saphenous vein graft to the distal right coronary artery was patent.  There was 40% narrowing within the overlapping stents in the proximal right  coronary artery.  Distal anastomosis was irregular, but no significant  obstruction.  The distal vessel gave rise to posterior descending and three  posterior lateral branches which were free of significant obstruction.   The LIMA graft to the LAD was patent and functioned normal.  The saphenous  vein graft to the diagonal branch of the LAD was completely occluded by  prior study.   LEFT VENTRICULOGRAM:  The left ventriculogram performed in the RAO  projection showed hypokinesis of the inferior basal  wall.  The overall wall  motion was good with estimated ejection fraction of 60%.   CONCLUSIONS:  1. Coronary artery disease status post coronary artery bypass graft surgery     in 1989.  2. Severe native vessel disease with total occlusion of the left anterior     descending and right coronary artery and less than 10% narrowing at the     stent site in the proximal circumflex artery.  3. Patent left internal mammary artery graft to the left anterior     descending, 40% narrowing at the stent site at the ostium of the vein     graft to the right coronary artery and total occlusion of the vein graft     to the diagonal branch of the left anterior descending (old).  4. Inferobasal wall hypokinesis with an estimated ejection fraction of 60%.   RECOMMENDATIONS:  There is no progression of disease and no source of  ischemia.  In view of these findings, I think the patient's recent symptoms  are not ischemic.  They may have been related to an arrhythmia and if they  recur will consider an event monitor.  Will plan discharge  later today.  Will plan followup in two to three weeks.                                               Charlies Constable, M.D. [facility]    BB/MEDQ  D:  03/09/2004  T:  03/10/2004  Job:  [id number]   cc:   Georgina Quint. Plotnikov, M.D. [facility]

## 2011-02-04 NOTE — H&P (Signed)
[place]. [facility]  Patient:    Robert [name], Robert [name] Visit Number: [id number] MRN: [id number]          Service Type: MED Location: CCUB [id number] Attending Physician:  Talitha Givens Dictated by:   Dian Queen, P.A.C. [facility] Admit Date:  06/25/2001   CC:         Sonda Primes, M.D. [facility]   History and Physical  CHIEF COMPLAINT:  Robert [name] is a 65 year old white married male here today with a chief complaint of chest pain.  HISTORY OF PRESENT ILLNESS:  Robert [name] has known coronary disease.  He underwent CABG in 1989.  He had a DMI with PTCA of the distal vein graft to the RCA in 1995.  In 1996 he had a stent placed to the ostium of the vein graft to the RCA, and in January 2000 he had a PTCA for focal instent restenosis in the ostial portion of the vein graft to the RCA.  He was last hospitalized in November 2001 at which time he was catheterized once again and his vein graft to the RCA at the previously stented site had 50% instent restenosis but otherwise was okay.  The vein graft to the diagonal had some 40-50% irregularities but nothing critical, and LIMA to the LAD was widely patent.  The native circumflex had stenosed to 80% and was dilated and stented to 0% by Dr. Juanda Chance without complications.  Ejection fraction was 55% at that time.  Thereafter, he did well.  He is admitted now with recurrent chest pain with some typical and atypical features.  He really does not have much of a marker for his coronary disease.  His symptoms are always vague and nondescript and difficult to sort out, as it is tonight.  He is admitted for further evaluation and therapy.  PAST MEDICAL HISTORY:  Please see above.  He has history of elevated cholesterol which has been well controlled on Zocor and he has preserved LV function.  REVIEW OF SYSTEMS:  No change in bowel habits, melena, hematochezia.  No GU symptoms.  Weight has been stable.  Systems review  otherwise negative.  PRESENT MEDICATIONS: 1. Plavix 75 mg q.d. long-term. 2. Monopril 10 mg q.d. 3. Aspirin 81 mg q.d. 4. Zocor 40 mg q.d. 5. Nitroglycerin p.r.n.  PHYSICAL EXAMINATION:  GENERAL:  Revealed a pleasant gentleman.  VITAL SIGNS:  Blood pressure today 130/80, in sinus rhythm.  HEENT:  Extraocular muscles intact.  Sclerae nonicteric.  Conjunctivae ______ bilaterally.  Face symmetric.  Lids normal.  NECK:  Supple without thyromegaly.  No bruits.  No JVD.  HEART:  Sinus rhythm.  S1, S2 normal, S4 is present.  There is no murmur.  LUNGS:  Clear to A&P bilaterally.  ABDOMEN:  Soft without mass, hepatosplenomegaly, bruits, tenderness.  EXTREMITIES:  Good peripheral pulses with no edema.  NEUROLOGIC:  Normal.  IMPRESSION: 1. Coronary artery disease with prior coronary artery bypass grafting in 1989    and multiple subsequent interventions as above, admitted now with    recurrent chest pain. 2. Hyperlipoproteinemia. 3. Preserved left ventricular function.  DISPOSITION:  Will go ahead and put him in the hospital, rule him out, and plan to catheterization him in the morning. Dictated by:   Dian Queen, P.A.C. [facility] Attending Physician:  Talitha Givens DD:  06/25/01 TD:  06/26/01 Job: [id number]

## 2011-02-04 NOTE — Assessment & Plan Note (Signed)
[facility]                         GASTROENTEROLOGY OFFICE NOTE   NAME:Robert [name], Robert [name]                    MRN:          [id number]  DATE:08/18/2006                            DOB:          07-Jul-1946    CHIEF COMPLAINT:  Rectal bleeding.   HISTORY:  A 65 year old white man that I know from a colonoscopy in  2005. He had mixed hemorrhoids and 2 small polyps. He had a large bowel  movement about 6 days ago, hard and then explosive and associated with  some bright red blood per rectum and subsequently he had some loose  stools all associated with bright red blood mixed with the stool and  with wiping. He did have normal but loose stool with that. Then for 3  days he did not have much of a bowel movement so last night he took an  enema and he had some blood pass with that. He has no abdominal pain. He  held his Plavix on his own. He does not recall but the indication for  the previous colonoscopy did say rectal bleeding. He has not had chronic  problems with this.   MEDICATIONS:  1. Plavix 75 mg per day (being held).  2. Lasix 20 mg daily.  3. Sulindac 200 mg b.i.d.  4. Atenolol 25 mg daily.  5. Monopril 40 mg daily.  6. Zocor 40 mg daily.  7. Omeprazole 20 mg daily.  8. Aspirin 81 mg daily.  9. Grape seed extract 100 mg daily.  10.Temazepam 30 mg q.h.s.  11.Amitriptyline 10 mg q.h.s.   DRUG ALLERGIES:  None known.   PAST MEDICAL HISTORY:  1. Coronary artery disease with previous coronary artery bypass      grafting (1989) and then subsequent coronary artery stenting in      1996, 1997 and 2000.  2. Hypertension.  3. Heart dysrhythmia.  4. Osteoarthritis.  5. Hernia repair.  6. Obesity.   FAMILY HISTORY:  Pertinent for heart disease, no colon cancer.   SOCIAL HISTORY:  Married. He has been in the [employer]. No  tobacco or drugs, one glass of wine a night.   REVIEW OF SYSTEMS:  See medical history form.   PHYSICAL  EXAMINATION:  VITAL SIGNS:  Height 6 feet, weight 251, blood  pressure 130/80, pulse 78.  GENERAL:  Obese, no acute distress.  LUNGS:  Clear.  HEART:  S1, S2. I hear no rubs, murmurs or gallops. Normal rhythm.  ABDOMEN:  Obese, soft, nontender, no organomegaly or mass.  RECTAL:  Shows no stool, no blood, prostate is normal. Anoscopy is  performed which demonstrates some internal hemorrhoids with violatious  and red markings on them consistent with inflamed hemorrhoids.   ASSESSMENT:  Rectal bleeding due to hemorrhoids.   PLAN:  1. I think he can restart his Plavix.  2. Hydrocortisone suppository nightly for 7 days and also after bowel      movements.  3. If this does not resolve, he is to followup.   I explained to him clearly to see me for persistent bleeding,  sigmoidoscopy and/or colonoscopy could be indicated, hemorrhoidal  ligation would be an option though I suspect that things will calm down  with the suppositories.     Iva Boop, MD,FACG  Electronically Signed    CEG/MedQ  DD: 08/18/2006  DT: 08/19/2006  Job #: [id number]

## 2011-02-04 NOTE — Cardiovascular Report (Signed)
[place]. [facility]  Patient:    Robert [name], Robert [name] Visit Number: [id number] MRN: [id number]          Service Type: MED Location: [phone] Attending Physician:  Talitha Givens Dictated by:   Daisey Must, M.D. [facility] Proc. Date: 06/26/01 Admit Date:  06/25/2001   CC:         Corwin Levins, M.D. [facility]  Bruce R. Juanda Chance, M.D. [facility]  Cardiac Catheterization Lab   Cardiac Catheterization  PROCEDURE:  Right and left heart catheterization with coronary angiography, bypass graft angiography, and left ventriculography.  CARDIOLOGIST:  Daisey Must, M.D. [facility]  INDICATION:  Mr. Duque is a 65 year old male with a history of previous coronary artery bypass graft surgery.  In November of last year, he underwent stent placement in the native left circumflex.  He also has a history of stent placement in the saphenous vein graft to the distal right coronary artery.  He was admitted to the hospital last evening with prolonged chest pain and ruled out for myocardial infarction.  He was subsequently referred for cardiac catheterization.  CATHETERIZATION PROCEDURAL NOTE:  A 6-French sheath was placed in the right femoral artery.   Catheters utilized included a 6-French JL5, JR4, IMA, ______, and ______   Contrast was Omnipaque.  There were no complications.  CATHETERIZATION RESULTS:  HEMODYNAMICS:  Left ventricular pressure 120/24, aortic pressure 120/76. There was no aortic valve gradient.  LEFT VENTRICULOGRAM:  Wall motion is normal.  Ejection fraction calculated at 65%.  There was no mitral valve regurgitation.  CORONARY ANGIOGRAPHY: (Right dominant).  Left main is normal.  Left anterior descending artery was 100% occluded at its origin.  The LAD fills via a left internal mammary artery graft.  Left circumflex has a 30% stenosis in the proximal edge of a stent.  Otherwise the stent is widely patent.  The circumflex gives rise to a small OM-1  which has a 60% stenosis proximally, a small OM-2, and a large OM-3.  The OM-3 has a 50% stenosis proximally.  Right coronary artery has diffuse disease in the proximal and mid vessel with diffuse 70% stenosis in both proximal and mid vessel.  The distal right coronary artery is 100% occluded.  The distal right coronary artery fills via saphenous vein graft.  Left internal mammary artery to the distal LAD has a 20% stenosis proximally but is otherwise patent throughout its course.  Further down in the distal LAD beyond the IMA insertion is a 30% stenosis.  The mammary graft fills the distal LAD as well as retrograde to the proximal and mid LAD including the first diagonal branch which is normal in size as well as a small to normal size second diagonal branch and a small to normal size third diagonal branch. The first diagonal has a 40% stenosis at its origin.  The saphenous vein graft to the first diagonal branch is 100% occluded.  This had previously been patent; however, the first diagonal appears to be adequately perfused via flow from the left anterior mammary artery graft.  Saphenous vein graft to the distal right coronary artery has a stent in the proximal graft which has a 50% stenosis within the stent.  This graft is otherwise patent, filling the distal right coronary artery which consists of a small to normal size posterior descending artery and three small posterolateral branches.  IMPRESSIONS: 1. Normal left ventricular systolic function. 2. Native three-vessel coronary artery disease with a patent stent in the  native left circumflex. 3. Status post coronary artery bypass grafting with a patent left internal    mammary artery to the distal left anterior descending artery and a patent    vein graft to the distal right coronary artery.  There is a new occlusion    of the saphenous vein graft to the diagonal; however, this diagonal which    had been grafted appears to  get adequate blood flow via the left internal    mammary artery graft.  PLAN:  Medical therapy. Dictated by:   Daisey Must, M.D. [facility] Attending Physician:  Talitha Givens DD:  06/26/01 TD:  06/26/01 Job: [id number]

## 2011-02-04 NOTE — Discharge Summary (Signed)
. [facility]  Patient:    Robert [name], Robert [name]                    MRN: [id number] Adm. Date:  [id number] Disc. Date: [id number] Attending:  Lenoria Farrier Dictator:   Lavella Hammock, P.A.                  Referring Physician Discharge [place]  DATE OF BIRTH:  November 25, 1945  PROCEDURES: 1. Cardiac catheterization. 2. Coronary arteriogram. 3. Left ventriculogram.  HOSPITAL COURSE:  Robert [name] is a 65 year old man with a history of bypass surgery in 1989, and an MI in 1995 treated with PTCA to the distal RCA through a vein graft.  He was seen in the office on July 03, 2000 and had a stress Cardiolite on October 26.  He had excellent exercise tolerance and his EKGs were negative.  However, the Cardiolite scan showed diaphragmatic attenuation with mild distal inferior/inferoapical ischemia and an EF of 59%.  Because of this, he was scheduled for cardiac catheterization.  The cardiac catheterization showed an LAD that was totalled, a circumflex with an 80% stenosis which was worse than the previous graft, an RCA that was totalled as well.  The SVG to RCA had a 50% stenosis at the stent site.  The SVG to the diagonal had a 40% ostial and a 50% stenosis at the anastomosis. The LIMA to LAD was okay and his left ventriculogram showed mild inferior basal hypokinesis with an EF of 55%.  He had PTCA and stent to the circumflex, reducing the stenosis from 80% to 0 with TIMI 3 flow post procedure.  He tolerated the procedure well and the sheath was removed without difficulty.  The next day, his groin was stable and he had no chest pain and no shortness of breath.  He was considered stable for discharge on July 21, 2000.  LABORATORY VALUES:  Hemoglobin 15.1, hematocrit 43.0, wbcs 6.2, platelets 176.  Sodium 140, potassium 3.7, chloride 106, CO2 27, BUN 12, creatinine 0.7, glucose 131.  Post procedure CK-MB 174/4.8.  DISCHARGE CONDITION:   Improved.  CONSULTS:  None.  COMPLICATIONS:  None.  DISCHARGE DIAGNOSES: 1. Coronary artery disease, status post percutaneous transluminal coronary    angioplasty and stent to the circumflex this admission. 2. History of aortocoronary bypass in 1989, with left internal mammary artery    to left anterior descending artery, saphenous vein graft to first    diagonal, and saphenous vein graft to right coronary artery. 3. History of myocardial infarction in 1995, with percutaneous transluminal    coronary angioplasty to the distal right coronary artery. 4. History of percutaneous transluminal coronary angioplasty and stent to the    saphenous vein graft to right coronary artery. 5. Preserved left ventricular function. 6. Hyperlipidemia. 7. Remote history of spinal meningitis. 8. History of asthma.  DISCHARGE INSTRUCTIONS:  ACTIVITY:  His activity level is to include no driving, no sexual or strenuous activity for two days, and then gradually increase.  DIET:  He is to stick to a low fat diet.  WOUND CARE:  He is to call the office for bleeding, swelling, or drainage at the catheterization site.  FOLLOW-UP:  He has a P.A. visit for groin check on November 9 at 10:15 a.m. He is to follow up with Robert [name], and the office will call.  He is to follow up with Robert [name] as needed.  DISCHARGE MEDICATIONS: 1. Zocor 40 mg q.d.  2. Monopril 10 mg q.d. 3. Coated aspirin 325 mg q.d. 4. Vitamin E 400 IU q.d. 5. Nitroglycerin 0.4 p.r.n. 6. Vitamin C q.d. 7. Plavix 75 mg q.d. DD:  07/21/00 TD:  07/21/00 Job: [id number]

## 2011-02-04 NOTE — Assessment & Plan Note (Signed)
[facility]                              CARDIOLOGY OFFICE NOTE   NAME:Robert [name], Robert [name]                    MRN:          [id number]  DATE:06/27/2006                            DOB:          [birth year]    PRIMARY CARE PHYSICIAN:  Corwin Levins, MD   MEDICAL HISTORY:  Robert [name] is 65 years old and had bypass surgery in  1989 and had a myocardial infarction treated with PCI of the distal  anastomosis of the vein graft to the right coronary artery.  He subsequently  has had intervention on the proximal portion of the vein graft to the right  and the proximal circumflex artery to the left calf in 2005, at which time  he had a patent LIMA to the LAD, no stenosis in the stent in the circumflex  artery, and 40% stenosis in the stent in the proximal vein graft to the  right coronary artery.  He has a vein graft to the diagonal, which is  occluded, but this appears to be filled by the LAD.   He has been doing quite well.  He has occasional feelings in his chest, for  which he takes nitroglycerin, but it is not clear these are anginal.  He has  also been very infrequent.  He has had no shortness of breath, no  palpitations.   We did a Myoview scan prior to this visit, and it showed a good LV function  with an ejection fraction of 55% and no ischemia.  No evidence of scar,  although we know he has a previous diaphragmatic wall infarction.   His past medical history is significant for hyperlipidemia.   His current medications include Zocor, Plavix, aspirin, Monopril, atenolol,  and Lasix.  He had a liver profile done at the [facility] which showed a  total cholesterol of 124, an HDL of 28, and an LDL of 85.   PHYSICAL EXAMINATION:  VITAL SIGNS:  The blood pressure was 141/96, pulse 56  and regular.  NECK:  There was no venous distention.  The carotid pulses were full without  bruits.  CHEST:  Clear without rales or rhonchi.  CARDIAC:  The cardiac  rhythm was regular.  The heart sounds were normal.  There were no murmurs, rubs or gallops.  ABDOMEN:  Soft with normal bowel sounds.  There is no hepatosplenomegaly.  EXTREMITIES: Peripheral pulses are full.  There is no peripheral edema.   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   IMPRESSION:  1. Coronary artery disease, status post coronary artery bypass graft      surgery in 1989, status post multiple percutaneous coronary      interventions with coronary anatomy, as described above, now stable,      with negative Cardiolite scan.  2. Good left ventricular function with ejection fraction of 59%.  3. History of premature ventricular contractions, now quiescent on      atenolol.  4. Hyperlipidemia.   RECOMMENDATIONS:  I think Robert [name] is doing quite well.  His HDL is  somewhat low, and his LDL is not  quite at target.  I am not sure if we can  do too much about his HDL, but I think we should try and get his LDL down to  target.  I have recommended that we switch him from Zocor 40 to Vytorin  10/40.  He gets this through the [place], and we have given him a prescription  for this.  We gave him an alternate prescription for Crestor 20 in case the  [facility] is unable to supply Vytorin.  We will plan to see him back in followup in  a year.            ______________________________  Everardo Beals. Juanda Chance, MD, [facility]     BRB/MedQ  DD:  06/27/2006  DT:  06/28/2006  Job #:  [id number]

## 2011-04-28 ENCOUNTER — Telehealth: Payer: Self-pay | Admitting: Cardiology

## 2011-04-28 NOTE — Telephone Encounter (Signed)
Left a message to call back.

## 2011-04-28 NOTE — Telephone Encounter (Signed)
Pt wants top have a stress test done prior to appt Dr. Juanda Chance always had him do one every year for 20 years and he want to schedule this before he schedules his appt

## 2011-04-28 NOTE — Telephone Encounter (Signed)
Pt calls requesting chemical stress test prior to his 05/24/11 ov with Dr. Excell Seltzer.   Pt states Dr. Juanda Chance ordered stress test each year (lexiscan last done 06/14/10) prior to yearly appt. I told him Dr. Excell Seltzer would probably need to see him first.  He has not been having any cardiac issues at this time.  [place] is going out of [place] on 06[place]11[place]11.   I told him this would be enough time to do a stress test if needed. Pt is agreeable with this plan. Pt has recently had lab work including cholesterol drawn and will have this faxed to our office prior to appt.

## 2011-04-29 ENCOUNTER — Ambulatory Visit (INDEPENDENT_AMBULATORY_CARE_PROVIDER_SITE_OTHER): Payer: Medicare Other | Admitting: Internal Medicine

## 2011-04-29 ENCOUNTER — Other Ambulatory Visit (INDEPENDENT_AMBULATORY_CARE_PROVIDER_SITE_OTHER): Payer: Medicare Other

## 2011-04-29 ENCOUNTER — Encounter: Payer: Self-pay | Admitting: Internal Medicine

## 2011-04-29 VITALS — BP 140/82 | HR 60 | Temp 98.0°F | Ht 73.0 in | Wt 249.5 lb

## 2011-04-29 DIAGNOSIS — R7309 Other abnormal glucose: Secondary | ICD-10-CM

## 2011-04-29 DIAGNOSIS — E785 Hyperlipidemia, unspecified: Secondary | ICD-10-CM

## 2011-04-29 DIAGNOSIS — I10 Essential (primary) hypertension: Secondary | ICD-10-CM

## 2011-04-29 DIAGNOSIS — R7302 Impaired glucose tolerance (oral): Secondary | ICD-10-CM

## 2011-04-29 DIAGNOSIS — D126 Benign neoplasm of colon, unspecified: Secondary | ICD-10-CM

## 2011-04-29 DIAGNOSIS — D509 Iron deficiency anemia, unspecified: Secondary | ICD-10-CM

## 2011-04-29 DIAGNOSIS — Z0000 Encounter for general adult medical examination without abnormal findings: Secondary | ICD-10-CM

## 2011-04-29 DIAGNOSIS — K635 Polyp of colon: Secondary | ICD-10-CM

## 2011-04-29 DIAGNOSIS — R718 Other abnormality of red blood cells: Secondary | ICD-10-CM | POA: Insufficient documentation

## 2011-04-29 DIAGNOSIS — N529 Male erectile dysfunction, unspecified: Secondary | ICD-10-CM | POA: Insufficient documentation

## 2011-04-29 DIAGNOSIS — Z125 Encounter for screening for malignant neoplasm of prostate: Secondary | ICD-10-CM

## 2011-04-29 DIAGNOSIS — F528 Other sexual dysfunction not due to a substance or known physiological condition: Secondary | ICD-10-CM

## 2011-04-29 LAB — URINALYSIS, ROUTINE W REFLEX MICROSCOPIC
Hgb urine dipstick: NEGATIVE
Urine Glucose: NEGATIVE
Urobilinogen, UA: 1 (ref 0.0–1.0)

## 2011-04-29 LAB — CBC WITH DIFFERENTIAL/PLATELET
Basophils Absolute: 0.1 10*3/uL (ref 0.0–0.1)
Eosinophils Absolute: 0.4 10*3/uL (ref 0.0–0.7)
Hemoglobin: 13.6 g/dL (ref 13.0–17.0)
Lymphocytes Relative: 30.6 % (ref 12.0–46.0)
Lymphs Abs: 2.3 10*3/uL (ref 0.7–4.0)
MCHC: 31.9 g/dL (ref 30.0–36.0)
Neutro Abs: 4 10*3/uL (ref 1.4–7.7)
RDW: 17.8 % — ABNORMAL HIGH (ref 11.5–14.6)

## 2011-04-29 LAB — LIPID PANEL
HDL: 41.4 mg/dL (ref >39.00)
Total CHOL/HDL Ratio: 4

## 2011-04-29 LAB — BASIC METABOLIC PANEL
CO2: 27 mEq/L (ref 19–32)
Calcium: 9 mg/dL (ref 8.4–10.5)
Chloride: 107 mEq/L (ref 96–112)
Potassium: 4.3 mEq/L (ref 3.5–5.1)
Sodium: 139 mEq/L (ref 135–145)

## 2011-04-29 LAB — IBC PANEL
Iron: 71 ug/dL (ref 42–165)
Transferrin: 376.8 mg/dL — ABNORMAL HIGH (ref 212.0–360.0)

## 2011-04-29 LAB — HEPATIC FUNCTION PANEL
AST: 29 U/L (ref 0–37)
Alkaline Phosphatase: 91 U/L (ref 39–117)
Bilirubin, Direct: 0.1 mg/dL (ref 0.0–0.3)
Total Protein: 7 g/dL (ref 6.0–8.3)

## 2011-04-29 LAB — PSA: PSA: 0.74 ng/mL (ref 0.10–4.00)

## 2011-04-29 MED ORDER — VARDENAFIL HCL 20 MG PO TABS: 20.0000 mg | ORAL_TABLET | ORAL | Status: DC | PRN

## 2011-04-29 NOTE — Patient Instructions (Signed)
Take all new medications as prescribed - the levitra Continue all other medications as before Please go to LAB in the Basement for the blood and/or urine tests to be done today Please call the phone number [phone] (the PhoneTree System) for results of testing in 2-3 days;  When calling, simply dial the number, and when prompted enter the MRN number above (the Medical Record Number) and the # key, then the message should start. If the iron deficiency anemia is confirmed, you will need to be referred to GI for further evalauation

## 2011-04-29 NOTE — Progress Notes (Signed)
Subjective:    Patient ID: Robert [name], male    DOB: 1946/03/10, 65 y.o.   MRN: [id number]  HPI Here for wellness and f/u;  Overall doing ok;  Pt denies CP, worsening SOB, DOE, wheezing, orthopnea, PND, worsening LE edema, palpitations, dizziness or syncope.  Pt denies neurological change such as new Headache, facial or extremity weakness.  Pt denies polydipsia, polyuria, or low sugar symptoms. Pt states overall good compliance with treatment and medications, good tolerability, and trying to follow lower cholesterol diet.  Pt denies worsening depressive symptoms, suicidal ideation or panic. No fever, wt loss, night sweats, loss of appetite, or other constitutional symptoms.  Pt states good ability with ADL's, low fall risk, home safety reviewed and adequate, no significant changes in hearing or vision, and occasionally active with exercise.  Has hx of colon polyp, overdue for colonscopy.  Called yesterday from [place] to say he was found on labs to have iron def anemia (lab results not available). Past Medical History  Diagnosis Date  . ALLERGIC RHINITIS 08/20/2007  . BENIGN PROSTATIC HYPERTROPHY 08/20/2007  . CORONARY ARTERY DISEASE 08/20/2007  . ELBOW PAIN, LEFT 04/15/2008  . ERECTILE DYSFUNCTION 08/20/2007  . HYPERLIPIDEMIA TYPE I / IV 09/03/2008  . HYPERLIPIDEMIA 08/20/2007  . HYPERTENSION 08/20/2007  . INSOMNIA-SLEEP DISORDER-UNSPEC 03/17/2008  . LOW BACK PAIN 08/20/2007  . MYOCARDIAL INFARCTION, HX OF 08/20/2007  . PREMATURE VENTRICULAR CONTRACTIONS 08/20/2007  . SCROTAL MASS 10/09/2008  . UNSPECIFIED HEMORRHAGE 08/19/2009  . Urinary frequency 10/09/2008  . URINARY INCONTINENCE, MALE 10/09/2008  . UTI 04/24/2009  . Iron deficiency anemia 04/29/2011  . Erectile dysfunction 04/29/2011   Past Surgical History  Procedure Date  . Coronary artery bypass graft 1989  . Stent x3 [id number] and 2000  . Inguinal herniorrhapy     x3  . S/p right shoulder rotator cuff 12/09    Dr. Darrelyn Hillock    reports that  he has quit smoking. He does not have any smokeless tobacco history on file. He reports that he drinks alcohol. His drug history not on file. family history includes Heart disease in his brother. Allergies  Allergen Reactions  . Metoprolol     REACTION: severe bradycardia   Current Outpatient Prescriptions on File Prior to Visit  Medication Sig Dispense Refill  . albuterol (PROAIR HFA) 108 (90 BASE) MCG/ACT inhaler Inhale 2 puffs into the lungs every 6 (six) hours as needed.        Marland Kitchen amitriptyline (ELAVIL) 25 MG tablet Take 25 mg by mouth daily.        Marland Kitchen aspirin 81 MG tablet Take 81 mg by mouth daily.        Marland Kitchen atenolol (TENORMIN) 25 MG tablet Take 25 mg by mouth daily.        . clopidogrel (PLAVIX) 75 MG tablet Take 75 mg by mouth daily.        Marland Kitchen omeprazole (PRILOSEC) 20 MG capsule Take 20 mg by mouth daily.        . simvastatin (ZOCOR) 80 MG tablet Take 1/2 tablet by mouth daily at bedtime       . sulindac (CLINORIL) 200 MG tablet Take 200 mg by mouth 2 (two) times daily.        . temazepam (RESTORIL) 30 MG capsule Take 30 mg by mouth at bedtime as needed.         Review of Systems Review of Systems  Constitutional: Negative for diaphoresis, activity change, appetite change and unexpected weight change.  HENT: Negative for hearing loss, ear pain, facial swelling, mouth sores and neck stiffness.   Eyes: Negative for pain, redness and visual disturbance.  Respiratory: Negative for shortness of breath and wheezing.   Cardiovascular: Negative for chest pain and palpitations.  Gastrointestinal: Negative for diarrhea, blood in stool, abdominal distention and rectal pain.  Genitourinary: Negative for hematuria, flank pain and decreased urine volume.  Musculoskeletal: Negative for myalgias and joint swelling.  Skin: Negative for color change and wound.  Neurological: Negative for syncope and numbness.  Hematological: Negative for adenopathy.  Psychiatric/Behavioral: Negative for  hallucinations, self-injury, decreased concentration and agitation.      Objective:   Physical Exam BP 140/82  Pulse 60  Temp(Src) 98 F (36.7 C) (Oral)  Ht 6\' 1"  (1.854 m)  Wt 249 lb 8 oz (113.172 kg)  BMI 32.92 kg/m2  SpO2 97% Physical Exam  VS noted Constitutional: Pt is oriented to person, place, and time. Appears well-developed and well-nourished.  HENT:  Head: Normocephalic and atraumatic.  Right Ear: External ear normal.  Left Ear: External ear normal.  Nose: Nose normal.  Mouth/Throat: Oropharynx is clear and moist.  Eyes: Conjunctivae and EOM are normal. Pupils are equal, round, and reactive to light.  Neck: Normal range of motion. Neck supple. No JVD present. No tracheal deviation present.  Cardiovascular: Normal rate, regular rhythm, normal heart sounds and intact distal pulses.   Pulmonary/Chest: Effort normal and breath sounds normal.  Abdominal: Soft. Bowel sounds are normal. There is no tenderness.  Musculoskeletal: Normal range of motion. Exhibits no edema.  Lymphadenopathy:  Has no cervical adenopathy.  Neurological: Pt is alert and oriented to person, place, and time. Pt has normal reflexes. No cranial nerve deficit.  Skin: Skin is warm and dry. No rash noted.  Psychiatric:  Has  normal mood and affect. Behavior is normal.        Assessment & Plan:

## 2011-04-30 ENCOUNTER — Encounter: Payer: Self-pay | Admitting: Internal Medicine

## 2011-04-30 DIAGNOSIS — Z860100 Personal history of colon polyps, unspecified: Secondary | ICD-10-CM | POA: Insufficient documentation

## 2011-04-30 NOTE — Assessment & Plan Note (Signed)
Pt reqeusts levitra prn

## 2011-04-30 NOTE — Assessment & Plan Note (Signed)
Unclear as pt without s/s blood loss or symtpoms, to check iron and cbc but not clear if diagnosis accurate

## 2011-04-30 NOTE — Assessment & Plan Note (Signed)
asympt - for a1c

## 2011-04-30 NOTE — Assessment & Plan Note (Signed)

## 2011-04-30 NOTE — Assessment & Plan Note (Signed)
Pt is without iron deficiency by labs, but still due for f/u colonoscopy - will order

## 2011-04-30 NOTE — Progress Notes (Signed)
Addended by: Corwin Levins on: 04/30/2011 06:41 PM   Modules accepted: Orders

## 2011-05-24 ENCOUNTER — Encounter: Payer: Self-pay | Admitting: Cardiovascular Disease

## 2011-05-24 ENCOUNTER — Ambulatory Visit (INDEPENDENT_AMBULATORY_CARE_PROVIDER_SITE_OTHER): Payer: Medicare Other | Admitting: Cardiovascular Disease

## 2011-05-24 VITALS — BP 128/84 | HR 60 | Ht 72.0 in | Wt 246.0 lb

## 2011-05-24 DIAGNOSIS — E785 Hyperlipidemia, unspecified: Secondary | ICD-10-CM

## 2011-05-24 DIAGNOSIS — I25810 Atherosclerosis of coronary artery bypass graft(s) without angina pectoris: Secondary | ICD-10-CM

## 2011-05-24 DIAGNOSIS — I10 Essential (primary) hypertension: Secondary | ICD-10-CM

## 2011-05-24 MED ORDER — TADALAFIL 20 MG PO TABS: 20.0000 mg | ORAL_TABLET | Freq: Every day | ORAL | Status: AC | PRN

## 2011-05-24 NOTE — Assessment & Plan Note (Signed)
The patient is stable without angina. He has not had typical symptoms of ischemia at his past presentations with unstable coronary syndrome's. He is very far out from his bypass surgery greater than 20 years and has a history of multiple PCI procedures. I have recommended a Lexiscan Myoview for further risk stratification.  He otherwise will continue on his current medical therapy.

## 2011-05-24 NOTE — Assessment & Plan Note (Signed)
Lipids at goal with total cholesterol 150, LDL 89, and HDL 41. Triglycerides are 99. The patient is on simvastatin 40 mg and he will continue this.

## 2011-05-24 NOTE — Progress Notes (Signed)
HPI:  This is a 65 year old gentleman presenting for followup evaluation. The patient has coronary artery disease status post remote coronary bypass surgery in 1989 in [place], [place]. He has since undergone multiple PCI procedures both in his native coronary circulation and for treatment of saphenous vein graft stenosis. He has not required PCI in many years. He had a myocardial infarction in 1995 and was treated with PCI.  The patient has been followed by Dr. Juanda Chance and I will be assuming his cardiac care and Dr. Regino Schultze retirement.  Overall he is doing well. He denies chest pain, dyspnea, edema, palpitations, lightheadedness, or syncope. He has not engaged in regular exercise, but he remains active. He plays golf regularly.  Outpatient Encounter Prescriptions as of 05/24/2011  Medication Sig Dispense Refill  . albuterol (PROAIR HFA) 108 (90 BASE) MCG/ACT inhaler Inhale 2 puffs into the lungs every 6 (six) hours as needed.        Marland Kitchen amitriptyline (ELAVIL) 25 MG tablet Take 25 mg by mouth daily.        Marland Kitchen aspirin 81 MG tablet Take 81 mg by mouth daily.        Marland Kitchen atenolol (TENORMIN) 25 MG tablet Take 25 mg by mouth daily.        . clopidogrel (PLAVIX) 75 MG tablet Take 75 mg by mouth daily.        Marland Kitchen omeprazole (PRILOSEC) 20 MG capsule Take 20 mg by mouth daily.        . simvastatin (ZOCOR) 80 MG tablet Take 1/2 tablet by mouth daily at bedtime       . sulindac (CLINORIL) 200 MG tablet Take 200 mg by mouth 2 (two) times daily.        . temazepam (RESTORIL) 30 MG capsule Take 30 mg by mouth at bedtime as needed.        . vardenafil (LEVITRA) 20 MG tablet Take 1 tablet (20 mg total) by mouth as needed for erectile dysfunction.  10 tablet  5    Allergies  Allergen Reactions  . Metoprolol     REACTION: severe bradycardia    Past Medical History  Diagnosis Date  . ALLERGIC RHINITIS 08/20/2007  . BENIGN PROSTATIC HYPERTROPHY 08/20/2007  . CORONARY ARTERY DISEASE 08/20/2007  . ELBOW PAIN,  LEFT 04/15/2008  . ERECTILE DYSFUNCTION 08/20/2007  . HYPERLIPIDEMIA TYPE I / IV 09/03/2008  . HYPERLIPIDEMIA 08/20/2007  . HYPERTENSION 08/20/2007  . INSOMNIA-SLEEP DISORDER-UNSPEC 03/17/2008  . LOW BACK PAIN 08/20/2007  . MYOCARDIAL INFARCTION, HX OF 08/20/2007  . PREMATURE VENTRICULAR CONTRACTIONS 08/20/2007  . SCROTAL MASS 10/09/2008  . UNSPECIFIED HEMORRHAGE 08/19/2009  . Urinary frequency 10/09/2008  . URINARY INCONTINENCE, MALE 10/09/2008  . UTI 04/24/2009  . Iron deficiency anemia 04/29/2011  . Erectile dysfunction 04/29/2011    ROS: Negative except as per HPI  BP 128/84  Pulse 60  Ht 6' (1.829 m)  Wt 246 lb (111.585 kg)  BMI 33.36 kg/m2  PHYSICAL EXAM: Pt is alert and oriented, NAD HEENT: normal Neck: JVP - normal, carotids 2+= without bruits Lungs: CTA bilaterally CV: RRR without murmur or gallop Abd: soft, NT, Positive BS, no hepatomegaly Ext: no C/C/E, distal pulses intact and equal Skin: warm/dry no rash  EKG:  Normal sinus rhythm 60 beats per minute, within normal limits.  ASSESSMENT AND PLAN:

## 2011-05-24 NOTE — Assessment & Plan Note (Signed)
Well-controlled on current medical program with atenolol.

## 2011-05-24 NOTE — Patient Instructions (Addendum)
Your physician wants you to follow-up in: 12 months. You will receive a reminder letter in the mail two months in advance. If you don't receive a letter, please call our office to schedule the follow-up appointment.  Your physician has requested that you have a lexiscan myoview. For further information please visit [email or web address] Please follow instruction sheet, as given.  Your physician has recommended you make the following change in your medication: Stop Levitra. Start Cialis 20 mg by mouth as needed.

## 2011-05-30 ENCOUNTER — Encounter: Payer: Self-pay | Admitting: *Deleted

## 2011-06-06 ENCOUNTER — Ambulatory Visit (HOSPITAL_COMMUNITY): Payer: Medicare Other | Attending: Cardiovascular Disease | Admitting: Radiology

## 2011-06-06 VITALS — Ht 72.0 in | Wt 240.0 lb

## 2011-06-06 DIAGNOSIS — I25810 Atherosclerosis of coronary artery bypass graft(s) without angina pectoris: Secondary | ICD-10-CM

## 2011-06-06 DIAGNOSIS — I2510 Atherosclerotic heart disease of native coronary artery without angina pectoris: Secondary | ICD-10-CM

## 2011-06-06 DIAGNOSIS — I4949 Other premature depolarization: Secondary | ICD-10-CM

## 2011-06-06 MED ORDER — TECHNETIUM TC 99M TETROFOSMIN IV KIT
11.0000 | PACK | Freq: Once | INTRAVENOUS | Status: AC | PRN
  Administered 2011-06-06: 11 via INTRAVENOUS

## 2011-06-06 MED ORDER — TECHNETIUM TC 99M TETROFOSMIN IV KIT
33.0000 | PACK | Freq: Once | INTRAVENOUS | Status: AC | PRN
  Administered 2011-06-06: 33 via INTRAVENOUS

## 2011-06-06 MED ORDER — REGADENOSON 0.4 MG/5ML IV SOLN
0.4000 mg | Freq: Once | INTRAVENOUS | Status: AC
  Administered 2011-06-06: 0.4 mg via INTRAVENOUS

## 2011-06-06 NOTE — Progress Notes (Signed)
[facility] SITE 3 NUCLEAR MED [address] [place] [address] [phone]  Cardiology Nuclear Med Study  Robert [name] is a 65 y.o. male [id number] [birth year]   Nuclear Med Background Indication for Stress Test:  Evaluation for Ischemia, Graft Patency, Stent Patency and PTCA Patency History:  Angioplasty, Asthma, '89 CABG, '95 Myocardial Infarction, 09/11 Myocardial Perfusion Study: EF 60% (-) ischemia and '00,'97,'98 Stents x3 Cardiac Risk Factors: History of Smoking, Hypertension and Lipids  Symptoms:  Palpitations   Nuclear Pre-Procedure Caffeine/Decaff Intake:  None NPO After: 7:00pm   Lungs:  clear IV 0.9% NS with Angio Cath:  20g  IV Site: R Antecubital x 1, tolerated well IV Started by:  Irean Hong, RN  Chest Size (in):  44L Cup Size: n/a  Height: 6' (1.829 m)  Weight:  240 lb (108.863 kg)  BMI:  Body mass index is 32.55 kg/(m^2). Tech Comments:  Last dose atenolol yesterday pm per patient    Nuclear Med Study 1 or 2 day study: 1 day  Stress Test Type:  Treadmill/Lexiscan  Reading MD: Olga Millers, MD  Order Authorizing Provider:  M.Cooper  Resting Radionuclide: Technetium 1m Tetrofosmin  Resting Radionuclide Dose: 11.0 mCi   Stress Radionuclide:  Technetium 35m Tetrofosmin  Stress Radionuclide Dose: 32.7 mCi           Stress Protocol Rest HR: 51 Stress HR: 77  Rest BP: 126/89 Stress BP: 152/89  Exercise Time (min): n/a METS: n/a   Predicted Max HR: 156 bpm % Max HR: 49.36 bpm Rate Pressure Product: [id number]   Dose of Adenosine (mg):  n/a Dose of Lexiscan: 0.4 mg  Dose of Atropine (mg): n/a Dose of Dobutamine: n/a mcg/kg/min (at max HR)  Stress Test Technologist: Milana Na, EMT-P  Nuclear Technologist:  Doyne Keel, CNMT     Rest Procedure:  Myocardial perfusion imaging was performed at rest 45 minutes following the intravenous administration of Technetium 51m Tetrofosmin. Rest ECG: Sinus Bradycardia pvc  Stress  Procedure:  The patient received IV Lexiscan 0.4 mg over 15-seconds.  Technetium 52m Tetrofosmin injected at 30-seconds.  There were no significant changes and a rare pvc with Lexiscan.  Quantitative spect images were obtained after a 45 minute delay. Stress ECG: No significant ST segment change suggestive of ischemia.  QPS Raw Data Images:  Acquisition technically good; normal left ventricular size. Stress Images:  There is decreased uptake in the apex. Rest Images:  There is decreased uptake in the apex, slightly less prominent compared to the stress images. Subtraction (SDS):  Probable apical thinning; cannot R/O minimal apical ischemia; overall felt to represent a low risk study. Transient Ischemic Dilatation (Normal <1.22):  1.19 Lung/Heart Ratio (Normal <0.45):  0.29  Quantitative Gated Spect Images QGS EDV:  118 ml QGS ESV:  47 ml QGS cine images:  Normal Wall Motion QGS EF: 60%  Impression Exercise Capacity:  Lexiscan with no exercise. BP Response:  Normal blood pressure response. Clinical Symptoms:  No chest pain. ECG Impression:  No significant ST segment change suggestive of ischemia. Comparison with Prior Nuclear Study: No significant change from previous study  Overall Impression:  Low risk stress nuclear study.  Olga Millers

## 2011-06-09 ENCOUNTER — Telehealth: Payer: Self-pay | Admitting: *Deleted

## 2011-06-09 NOTE — Telephone Encounter (Signed)
Pt given results of stress test--nt

## 2011-06-24 LAB — COMPREHENSIVE METABOLIC PANEL
ALT: 32 U/L (ref 0–53)
BUN: 16 mg/dL (ref 6–23)
Calcium: 9.1 mg/dL (ref 8.4–10.5)
Glucose, Bld: 78 mg/dL (ref 70–99)
Sodium: 141 mEq/L (ref 135–145)
Total Protein: 6.5 g/dL (ref 6.0–8.3)

## 2011-06-24 LAB — DIFFERENTIAL
Lymphocytes Relative: 29 % (ref 12–46)
Lymphs Abs: 1.9 10*3/uL (ref 0.7–4.0)
Monocytes Relative: 10 % (ref 3–12)
Neutro Abs: 3.8 10*3/uL (ref 1.7–7.7)
Neutrophils Relative %: 55 % (ref 43–77)

## 2011-06-24 LAB — URINE CULTURE
Colony Count: NO GROWTH
Special Requests: NEGATIVE

## 2011-06-24 LAB — URINALYSIS, ROUTINE W REFLEX MICROSCOPIC
Glucose, UA: NEGATIVE mg/dL
Hgb urine dipstick: NEGATIVE
Ketones, ur: NEGATIVE mg/dL
Protein, ur: NEGATIVE mg/dL

## 2011-06-24 LAB — CBC
Hemoglobin: 16.3 g/dL (ref 13.0–17.0)
MCHC: 32.7 g/dL (ref 30.0–36.0)
RDW: 13.8 % (ref 11.5–15.5)

## 2011-06-24 LAB — TYPE AND SCREEN: Antibody Screen: NEGATIVE

## 2011-06-24 LAB — PROTIME-INR: INR: 1 (ref 0.00–1.49)

## 2011-06-24 LAB — APTT: aPTT: 30 seconds (ref 24–37)

## 2011-07-21 ENCOUNTER — Encounter: Payer: Self-pay | Admitting: Internal Medicine

## 2011-08-19 ENCOUNTER — Ambulatory Visit (INDEPENDENT_AMBULATORY_CARE_PROVIDER_SITE_OTHER): Payer: Medicare Other | Admitting: Internal Medicine

## 2011-08-19 ENCOUNTER — Encounter: Payer: Self-pay | Admitting: Internal Medicine

## 2011-08-19 ENCOUNTER — Other Ambulatory Visit (INDEPENDENT_AMBULATORY_CARE_PROVIDER_SITE_OTHER): Payer: Medicare Other

## 2011-08-19 VITALS — BP 138/80 | HR 78 | Ht 72.0 in | Wt 242.6 lb

## 2011-08-19 DIAGNOSIS — R718 Other abnormality of red blood cells: Secondary | ICD-10-CM

## 2011-08-19 DIAGNOSIS — Z860100 Personal history of colon polyps, unspecified: Secondary | ICD-10-CM

## 2011-08-19 DIAGNOSIS — Z7902 Long term (current) use of antithrombotics/antiplatelets: Secondary | ICD-10-CM

## 2011-08-19 DIAGNOSIS — Z1211 Encounter for screening for malignant neoplasm of colon: Secondary | ICD-10-CM

## 2011-08-19 LAB — CBC WITH DIFFERENTIAL/PLATELET
Eosinophils Relative: 5.5 % — ABNORMAL HIGH (ref 0.0–5.0)
HCT: 42.4 % (ref 39.0–52.0)
Hemoglobin: 13.8 g/dL (ref 13.0–17.0)
Lymphocytes Relative: 34.1 % (ref 12.0–46.0)
Lymphs Abs: 2.2 10*3/uL (ref 0.7–4.0)
Monocytes Relative: 11.3 % (ref 3.0–12.0)
Neutro Abs: 3.2 10*3/uL (ref 1.4–7.7)
Platelets: 215 10*3/uL (ref 150.0–400.0)
WBC: 6.6 10*3/uL (ref 4.5–10.5)

## 2011-08-19 MED ORDER — PEG-KCL-NACL-NASULF-NA ASC-C 100 G PO SOLR: 1.0000 | Freq: Once | ORAL | Status: DC

## 2011-08-19 NOTE — Progress Notes (Signed)
Subjective:    Patient ID: Robert [name], male    DOB: 1946/09/13, 65 y.o.   MRN: [id number]  HPI Robert [name] presents for follow up of colon polyps. He has coronary artery disease and takes Plavix and aspirin, recently began seeing Dr. Excell Seltzer after his other cardiologist retired. He also saw his primary care physician for preventive visit 3 months ago and he was thought to possibly have an iron deficiency anemia, through some labs at the [place]. He does have a chronic microcytosis over the years. He has not noted any bleeding or bowel habit changes or any particular GI issues. There has been some rare chest pain treated with nitroglycerin. Dr. Jonny Ruiz checked his labs and he did not appear to have an iron deficiency anemia though his microcytosis was worsening and his RDW was increasing. The patient is otherwise feeling well and preparing to go on a cruise in the [place].  Allergies  Allergen Reactions  . Metoprolol     REACTION: severe bradycardia   Outpatient Prescriptions Prior to Visit  Medication Sig Dispense Refill  . albuterol (PROAIR HFA) 108 (90 BASE) MCG/ACT inhaler Inhale 2 puffs into the lungs every 6 (six) hours as needed.        Marland Kitchen amitriptyline (ELAVIL) 25 MG tablet Take 25 mg by mouth daily.        Marland Kitchen aspirin 81 MG tablet Take 81 mg by mouth daily.        Marland Kitchen atenolol (TENORMIN) 25 MG tablet Take 25 mg by mouth daily.        . clopidogrel (PLAVIX) 75 MG tablet Take 75 mg by mouth daily.        Marland Kitchen omeprazole (PRILOSEC) 20 MG capsule Take 20 mg by mouth daily.        . simvastatin (ZOCOR) 80 MG tablet Take 1/2 tablet by mouth daily at bedtime       . sulindac (CLINORIL) 200 MG tablet Take 200 mg by mouth 2 (two) times daily.        . temazepam (RESTORIL) 30 MG capsule Take 30 mg by mouth at bedtime as needed.         Past Medical History  Diagnosis Date  . ALLERGIC RHINITIS 08/20/2007  . BENIGN PROSTATIC HYPERTROPHY 08/20/2007  . CORONARY ARTERY DISEASE 08/20/2007  . ELBOW PAIN,  LEFT 04/15/2008  . ERECTILE DYSFUNCTION 08/20/2007  . HYPERLIPIDEMIA TYPE I / IV 09/03/2008  . HYPERTENSION 08/20/2007  . INSOMNIA-SLEEP DISORDER-UNSPEC 03/17/2008  . LOW BACK PAIN 08/20/2007  . MYOCARDIAL INFARCTION, HX OF 08/20/2007  . PREMATURE VENTRICULAR CONTRACTIONS 08/20/2007  . SCROTAL MASS 10/09/2008  . URINARY INCONTINENCE, MALE 10/09/2008  . UTI 04/24/2009  . History of meningitis   . Microcytosis    Past Surgical History  Procedure Date  . Coronary artery bypass graft 1989  . Stent x3 [id number] and 2000  . Transurethral resection of prostate 10/02/2009    Gyrus transurethral resection   . Cystoscopy 08/07/2009    and right hydrocelectomy  . Colonoscopy w/ biopsies and polypectomy 11/20/2003    adenomatous polyp, internal and external hemorrhoids  . Turp vaporization   . Tendon repair     left foot  . Inguinal hernia repair     X3  . Rotator cuff repair 2009    Gioffre    Family History  Problem Relation Age of Onset  . Heart disease Brother   . Coronary artery disease Father   . Coronary artery disease Mother  pacemaker   History   Social History  . Marital Status: Married    Spouse Name: N/A    Number of Children: 1  . Years of Education: N/A   Occupational History  . 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 Smoking status: Former Games developer  . Smokeless tobacco: Never Used  . Alcohol Use: Yes  . Drug Use: No  . Sexually Active: Not on file   Allergies  Allergen Reactions  . Metoprolol     REACTION: severe bradycardia           Review of Systems He does have some urinary incontinence, status post transurethral resection of the prostate. All other review of systems are negative or as mentioned above.    Objective:   Physical Exam General:  Well-developed, well-nourished and in no acute distress Eyes:  anicteric. ENT:   Mouth and posterior pharynx free of lesions.  Lungs: Clear to auscultation  bilaterally. Heart:  S1S2, no rubs, murmurs, gallops. - Distant heart sounds Abdomen:  soft, non-tender, no hepatosplenomegaly, hernia, or mass and BS+.  Rectal: Deferred until colonoscopy Extremities:   no edema Neuro:  A&O x 3.  Psych:  appropriate mood and  affect.        Assessment & Plan:  #1 personal history of adenomatous colon polyps, 2 diminutive adenomas in 2005. Appropriate time for screening colonoscopy  #2 microcytosis, chronic but increasing with rising RDW. Probably not iron deficiency anemia. Will recheck ferritin and CBC to clarify. He does take chronic nonsteroidals as well as Plavix and aspirin. He is on a PPI. This should reduce the risk of ulceration but that could cause chronic occult blood loss.  #3 coronary artery disease, on Plavix, will hold his Plavix prior colonoscopy but seek approval of cardiologist it is reasonable to hold this. I've explained to the patient there is some risk of having a coronary event or clot off of the Plavix. Would continue aspirin.

## 2011-08-19 NOTE — Patient Instructions (Signed)
Please go to the basement upon leaving today to have your labs done. You have been scheduled for a Colonoscopy with separate instructions given. Your prep kit has been sent to your pharmacy for you to pick up. You will be contacted by our office prior to your procedure for directions on holding your Plavix.  If you do not hear from our office 1 week prior to your scheduled procedure, please call [phone] to discuss.

## 2011-08-19 NOTE — Assessment & Plan Note (Addendum)
He has had a microcytosis going back years. I have looked at records as far as 2002. MCV is always been in the 70s. It is less than it was, and his RDW is elevated. I do not think iron deficiency anemia is a clear diagnosis. It may be, and I will check a ferritin and a repeat a CBC today to see what is going on and hopefully clarify this.

## 2011-08-22 LAB — FERRITIN: Ferritin: 8.3 ng/mL — ABNORMAL LOW (ref 22.0–322.0)

## 2011-08-23 ENCOUNTER — Telehealth: Payer: Self-pay | Admitting: Gastroenterology

## 2011-08-23 NOTE — Telephone Encounter (Signed)
Received a note from Dr. Excell Seltzer stating pt was at low risk and can discontinue Plavix 5-7 day prior to procedure. Called patient and let a message on voicemail to return my call.

## 2011-08-24 ENCOUNTER — Encounter: Payer: Self-pay | Admitting: Internal Medicine

## 2011-08-24 DIAGNOSIS — D509 Iron deficiency anemia, unspecified: Secondary | ICD-10-CM | POA: Insufficient documentation

## 2011-08-24 NOTE — Progress Notes (Signed)
Quick Note:  Turns out his iron level is low  I want to add an EGD to his colonoscopy to look for cause of losing blood (iron) from stomach and upper GI tract  Please let him know - looks like there is still 1 slot that afternoon he is scheduled for ______

## 2011-08-26 ENCOUNTER — Telehealth: Payer: Self-pay | Admitting: Gastroenterology

## 2011-08-26 NOTE — Telephone Encounter (Signed)
Message copied by Bernita Buffy on Fri Aug 26, 2011  1:38 PM ------      Message from: Tedra Senegal A      Created: Fri Aug 19, 2011  9:52 AM       Letter sent to Dr. Excell Seltzer concerning the discontinuing of Plvix 5-7 days prior to procedure on 09/06/11. Make sure we receive a response and contact patient.

## 2011-08-26 NOTE — Telephone Encounter (Signed)
Called and left a message on patient's voicemail to return my call.

## 2011-08-26 NOTE — Telephone Encounter (Signed)
Staff message was sent to Julieta Gutting at Dr. Earmon Phoenix office concerning patient Plavix and procedure. Waiting on approval/denial letter.

## 2011-08-29 ENCOUNTER — Telehealth: Payer: Self-pay | Admitting: Gastroenterology

## 2011-08-29 NOTE — Telephone Encounter (Signed)
Patient informed. 

## 2011-08-29 NOTE — Telephone Encounter (Signed)
Message copied by Bernita Buffy on Mon Aug 29, 2011  4:05 PM ------      Message from: Iona Coach      Created: Mon Aug 29, 2011  9:41 AM       Please look at your 08/23/11 phone note.  You have documented that Dr Excell Seltzer has already responded to this question.             Lauren RN                  Samiha Denapoli A.  08/23/2011  2:16 PM  Signed      Received a note from Dr. Excell Seltzer stating pt was at low risk and can discontinue Plavix 5-7 day prior to procedure. Called patient and let a message on voicemail to return my call.            ----- Message -----         From: Vinod Mikesell A. Earlene Plater         Sent: 08/26/2011   1:36 PM           To: Sharyn Blitz, RN            Letter sent to Dr. Excell Seltzer concerning the discontinuing of Plvix 5-7 days prior to procedure on 09/06/11. Could you please see if this was addressed and forward approval letter to me.  Thank you.

## 2011-08-31 ENCOUNTER — Telehealth: Payer: Self-pay | Admitting: Internal Medicine

## 2011-08-31 HISTORY — PX: COLONOSCOPY: SHX174

## 2011-08-31 NOTE — Telephone Encounter (Signed)
Called the patient to let him know that he could come pick up a voucher for the Movieprep. I called the pharmacy to see if the voucher could be called in and it could be. Called the patient back to let him know that the voucher was called in to go back by the pharmacy to get refund.

## 2011-09-06 ENCOUNTER — Ambulatory Visit (AMBULATORY_SURGERY_CENTER): Payer: Medicare Other | Admitting: Internal Medicine

## 2011-09-06 ENCOUNTER — Encounter: Payer: Self-pay | Admitting: Internal Medicine

## 2011-09-06 DIAGNOSIS — R933 Abnormal findings on diagnostic imaging of other parts of digestive tract: Secondary | ICD-10-CM

## 2011-09-06 DIAGNOSIS — Z86010 Personal history of colonic polyps: Secondary | ICD-10-CM

## 2011-09-06 DIAGNOSIS — K2270 Barrett's esophagus without dysplasia: Secondary | ICD-10-CM

## 2011-09-06 DIAGNOSIS — D649 Anemia, unspecified: Secondary | ICD-10-CM

## 2011-09-06 DIAGNOSIS — Z1211 Encounter for screening for malignant neoplasm of colon: Secondary | ICD-10-CM

## 2011-09-06 HISTORY — PX: UPPER GI ENDOSCOPY: SHX6162

## 2011-09-06 MED ORDER — FERROUS SULFATE 325 (65 FE) MG PO TABS: 325.0000 mg | ORAL_TABLET | Freq: Every day | ORAL | Status: DC

## 2011-09-06 MED ORDER — SODIUM CHLORIDE 0.9 % IV SOLN: 500.0000 mL | INTRAVENOUS | Status: DC

## 2011-09-06 NOTE — Op Note (Signed)
[facility] [address] [place], [place]  [address]  ENDOSCOPY PROCEDURE REPORT  PATIENT:  Robert, [name]  MR#:  [id number] BIRTHDATE:  May 31, 1946, 65 yrs. old  GENDER:  male  ENDOSCOPIST:  Iva Boop, MD, [facility]  PROCEDURE DATE:  09/06/2011 PROCEDURE:  EGD with biopsy, 29562 ASA CLASS:  Class II INDICATIONS:  anemia  MEDICATIONS:   These medications were titrated to patient response per physician's verbal order, Fentanyl 50 mcg IV, Versed 6 mg IV TOPICAL ANESTHETIC:  Cetacaine Spray  DESCRIPTION OF PROCEDURE:   After the risks benefits and alternatives of the procedure were thoroughly explained, informed consent was obtained.  The LB GIF-H180 [id number] endoscope was introduced through the mouth and advanced to the second portion of the duodenum, without limitations.  The instrument was slowly withdrawn as the mucosa was fully examined. <<PROCEDUREIMAGES>>  There were columnar-type mucosal changes in the distal esophagus, that could represent Barrett's esophagus. in the distal esophagus. Columnar area 2x2 cm from 40-42 cm. no nodules or ulcers. Multiple biopsies were obtained and sent to pathology.  Otherwise the examination was normal.    Retroflexed views revealed no abnormalities.    The scope was then withdrawn from the patient and the procedure completed.  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 eSIGNED:   Iva Boop at 09/06/2011 03:54 PM  Jerrye Beavers, [phone]

## 2011-09-06 NOTE — Progress Notes (Signed)
Patient did not experience any of the following events: a burn prior to discharge; a fall within the facility; wrong site/side/patient/procedure/implant event; or a hospital transfer or hospital admission upon discharge from the facility. (G8907) Patient did not have preoperative order for IV antibiotic SSI prophylaxis. (G8918)  

## 2011-09-06 NOTE — Patient Instructions (Addendum)
The upper endoscopy showed some changes in the lining of the esophagus that might be a condition known as Barrett's esophagus. This is a pre-cancerous condition with a very low risk of turning to cancer over time. I will let you know about the biopsy results and if that is diagnosed by pathology, then a repeat EGD exam in about a year is the next step. You do need to stay on your omeprazole. Please read the handout provided.  The colonoscopy was normal. I recommend a routine repeat colonoscopy in about 7 years (2019).  You should start taking ferrous sulfate daily (added to your med list) to restore your iron levels and when you follow-up with primary care have them recheck the levels in 2013 (not before 3 months). Resume Plavix.  Iva Boop, MD, [facility]

## 2011-09-06 NOTE — Op Note (Signed)
[facility] [address] [place], [place]  [address]  COLONOSCOPY PROCEDURE REPORT  PATIENT:  Robert [name], Robert [name]  MR#:  [id number] BIRTHDATE:  17-Aug-1946, 65 yrs. old  GENDER:  male ENDOSCOPIST:  Iva Boop, MD, [facility]  PROCEDURE DATE:  09/06/2011 PROCEDURE:  Colonoscopy 95284 ASA CLASS:  Class II INDICATIONS:  surveillance and high-risk screening, history of pre-cancerous (adenomatous) colon polyps 2 diminutive adenomas 2005 MEDICATIONS:   There was residual sedation effect present from prior procedure., These medications were titrated to patient response per physician's verbal order, Fentanyl 50 mcg IV, Versed 2 mg IV  DESCRIPTION OF PROCEDURE:   After the risks benefits and alternatives of the procedure were thoroughly explained, informed consent was obtained.  Digital rectal exam was performed and revealed no abnormalities and normal prostate.   The LB CF-H180AL [id number] endoscope was introduced through the anus and advanced to the terminal ileum which was intubated for a short distance, without limitations.  The quality of the prep was adequate, using MoviPrep.  The instrument was then slowly withdrawn as the colon was fully examined. <<PROCEDUREIMAGES>>  FINDINGS:  A normal appearing terminal ileum, cecum, ileocecal valve, and appendiceal orifice were identified. The ascending, hepatic flexure, transverse, splenic flexure, descending, sigmoid colon, and rectum appeared unremarkable.   Retroflexed views in the rectum revealed no abnormalities.    The time to cecum = 6:32 minutes. The scope was then withdrawn in 16:22 minutes from the cecum and the procedure completed. COMPLICATIONS:  None ENDOSCOPIC IMPRESSION: 1) Normal colonoscopy - adequate prep 2) Normal terminal ileum  REPEAT EXAM:  In 7 year(s) for routine screening colonoscopy.  Iva Boop, MD, [facility]  CC:  The Patient  n. eSIGNED:   Iva Boop at 09/06/2011 03:57 PM  Jerrye Beavers, [phone]

## 2011-09-07 ENCOUNTER — Telehealth: Payer: Self-pay

## 2011-09-07 NOTE — Telephone Encounter (Signed)
Follow up Call- Patient questions:  Do you have a fever, pain , or abdominal swelling? no Pain Score  0 *  Have you tolerated food without any problems? yes  Have you been able to return to your normal activities? yes  Do you have any questions about your discharge instructions: Diet   no Medications  no Follow up visit  no  Do you have questions or concerns about your Care? no  Actions: * If pain score is 4 or above: No action needed, pain <4.  "I did fine, no problems". maw

## 2011-09-12 ENCOUNTER — Encounter: Payer: Self-pay | Admitting: Internal Medicine

## 2011-09-12 DIAGNOSIS — K2270 Barrett's esophagus without dysplasia: Secondary | ICD-10-CM | POA: Insufficient documentation

## 2011-09-12 NOTE — Progress Notes (Signed)
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 Repeat EGD in approximately one year, December 2013. ______

## 2012-03-06 ENCOUNTER — Other Ambulatory Visit: Payer: Self-pay

## 2012-03-06 NOTE — Telephone Encounter (Signed)
A user error has taken place: encounter opened in error, closed for administrative reasons.

## 2012-04-04 ENCOUNTER — Telehealth: Payer: Self-pay | Admitting: Cardiovascular Disease

## 2012-04-04 DIAGNOSIS — I25810 Atherosclerosis of coronary artery bypass graft(s) without angina pectoris: Secondary | ICD-10-CM

## 2012-04-04 NOTE — Telephone Encounter (Signed)
Left message to call back  

## 2012-04-04 NOTE — Telephone Encounter (Signed)
Patient is an old Dr Juanda Chance patient and he always had him come for his stress test prior to his office visits and just wanted to make sure he didn't need to have prior to ov with Dr Excell Seltzer ins September.  Will forward to Lauren RN and Dr Excell Seltzer for review. Patient aware it will be next week sometime before he gets a call back

## 2012-04-04 NOTE — Telephone Encounter (Signed)
New msg Pt wanted to know should he have myoview before appt with Dr Excell Seltzer on [phone]. He had one last year and wanted to discuss with you

## 2012-04-05 NOTE — Telephone Encounter (Signed)
I spoke with the pt and made him aware that Dr Excell Seltzer would like him to have a stress myoview prior to appointment.  Order placed. I will have P[facility] contact the pt with appointment.

## 2012-04-05 NOTE — Telephone Encounter (Signed)
He has extensive CAD s/p CABG and multiple PCI's. Has not had typical symptoms of ischemia in past. He should have stress Myoview before visit.

## 2012-05-16 ENCOUNTER — Ambulatory Visit (HOSPITAL_COMMUNITY): Payer: Medicare Other | Attending: Cardiovascular Disease | Admitting: Radiology

## 2012-05-16 VITALS — BP 132/84 | Ht 72.0 in | Wt 239.0 lb

## 2012-05-16 DIAGNOSIS — J45909 Unspecified asthma, uncomplicated: Secondary | ICD-10-CM | POA: Insufficient documentation

## 2012-05-16 DIAGNOSIS — I10 Essential (primary) hypertension: Secondary | ICD-10-CM | POA: Insufficient documentation

## 2012-05-16 DIAGNOSIS — I25810 Atherosclerosis of coronary artery bypass graft(s) without angina pectoris: Secondary | ICD-10-CM

## 2012-05-16 DIAGNOSIS — Z87891 Personal history of nicotine dependence: Secondary | ICD-10-CM | POA: Insufficient documentation

## 2012-05-16 DIAGNOSIS — R002 Palpitations: Secondary | ICD-10-CM | POA: Insufficient documentation

## 2012-05-16 DIAGNOSIS — I2510 Atherosclerotic heart disease of native coronary artery without angina pectoris: Secondary | ICD-10-CM

## 2012-05-16 MED ORDER — TECHNETIUM TC 99M TETROFOSMIN IV KIT
33.0000 | PACK | Freq: Once | INTRAVENOUS | Status: AC | PRN
  Administered 2012-05-16: 33 via INTRAVENOUS

## 2012-05-16 MED ORDER — TECHNETIUM TC 99M TETROFOSMIN IV KIT
11.0000 | PACK | Freq: Once | INTRAVENOUS | Status: AC | PRN
  Administered 2012-05-16: 11 via INTRAVENOUS

## 2012-05-16 MED ORDER — REGADENOSON 0.4 MG/5ML IV SOLN
0.4000 mg | Freq: Once | INTRAVENOUS | Status: AC
  Administered 2012-05-16: 0.4 mg via INTRAVENOUS

## 2012-05-16 NOTE — Progress Notes (Signed)
[facility] SITE 3 NUCLEAR MED [address] [place] [address] [phone]  Cardiology Nuclear Med Study  Robert [name] is a 66 y.o. male     MRN : [id number]     DOB: 10/15/1945  Procedure Date: 05/16/2012  Nuclear Med Background Indication for Stress Test:  Evaluation for Ischemia, Graft Patency and PTCA/Stent Patency History:  Asthma and 1989 CABG: [place] MI-DMI-PTCA SVG RCA 1996 SVG-RCA Stent  2000 SVG -RCA Stent: Angioplasty  2001 Stents SVG - CFX  02/2004 Heart Cath: patent stent EF: 60% patent LIMA-LAD 40% instent SVG-RCA  occludded SVG- DX (old)  05/24/11  MPS: 60% probable apical thinning low risk EF: 60%  Cardiac Risk Factors: History of Smoking, Hypertension and Lipids  Symptoms:  Palpitations   Nuclear Pre-Procedure Caffeine/Decaff Intake:  None > 12 hrs NPO After: 7:00pm   Lungs:  clear O2 Sat: 95% on room air. IV 0.9% NS with Angio Cath:  22g  IV Site: L Antecubital x 1, tolerated well IV Started by:  Irean Hong, RN  Chest Size (in):  46 Cup Size: n/a  Height: 6' (1.829 m)  Weight:  239 lb (108.41 kg)  BMI:  Body mass index is 32.41 kg/(m^2). Tech Comments:  Took tenormin last night    Nuclear Med Study 1 or 2 day study: 1 day  Stress Test Type:  Treadmill/Lexiscan  Reading MD: Olga Millers, MD  Order Authorizing Provider:  Tonny Bollman, MD  Resting Radionuclide: Technetium 51m Tetrofosmin  Resting Radionuclide Dose: 11.0 mCi   Stress Radionuclide:  Technetium 67m Tetrofosmin  Stress Radionuclide Dose: 33.0 mCi           Stress Protocol Rest HR: 59 Stress HR: 86  Rest BP: 132/84 Stress BP: 170/101  Exercise Time (min): n/a METS: n/a   Predicted Max HR: 155 bpm % Max HR: 55.48 bpm Rate Pressure Product: 21308   Dose of Adenosine (mg):  n/a Dose of Lexiscan: 0.4 mg  Dose of Atropine (mg): n/a Dose of Dobutamine: n/a mcg/kg/min (at max HR)  Stress Test Technologist: Milana Na, EMT-P  Nuclear Technologist:   Domenic Polite, CNMT     Rest Procedure:  Myocardial perfusion imaging was performed at rest 45 minutes following the intravenous administration of Technetium 66m Tetrofosmin. Rest ECG: Sinus Bradycardia with PVCS  Stress Procedure:  The patient received IV Lexiscan 0.4 mg over 15-seconds with concurrent low level exercise and then Technetium 63m Tetrofosmin was injected at 30-seconds while the patient continued walking one more minute. There were no significant changes, sob, hot, and rare pvcs with Lexiscan. Quantitative spect images were obtained after a 45-minute delay. Stress ECG: No significant change from baseline ECG  QPS Raw Data Images:  Acquisition technically good; normal left ventricular size. Stress Images:  Normal homogeneous uptake in all areas of the myocardium. Rest Images:  Normal homogeneous uptake in all areas of the myocardium. Subtraction (SDS):  No evidence of ischemia. Transient Ischemic Dilatation (Normal <1.22):  1.15 Lung/Heart Ratio (Normal <0.45):  0.40  Quantitative Gated Spect Images QGS EDV:  108 ml QGS ESV:  50 ml  Impression Exercise Capacity:  Lexiscan with no exercise. BP Response:  Normal blood pressure response. Clinical Symptoms:  There is dyspnea. ECG Impression:  No significant ST segment change suggestive of ischemia. Comparison with Prior Nuclear Study: No images to compare  Overall Impression:  Normal stress nuclear study.  LV Ejection Fraction: 54%.  LV Wall Motion:  NL LV Function; NL Wall  Motion  Olga Millers

## 2012-05-25 ENCOUNTER — Ambulatory Visit: Payer: Medicare Other | Admitting: Cardiovascular Disease

## 2012-05-31 ENCOUNTER — Encounter: Payer: Self-pay | Admitting: Cardiovascular Disease

## 2012-06-08 ENCOUNTER — Ambulatory Visit (INDEPENDENT_AMBULATORY_CARE_PROVIDER_SITE_OTHER): Payer: Medicare Other | Admitting: Cardiovascular Disease

## 2012-06-08 ENCOUNTER — Encounter: Payer: Self-pay | Admitting: Cardiovascular Disease

## 2012-06-08 VITALS — BP 138/90 | HR 68 | Resp 18 | Ht 72.0 in | Wt 245.0 lb

## 2012-06-08 DIAGNOSIS — I2510 Atherosclerotic heart disease of native coronary artery without angina pectoris: Secondary | ICD-10-CM

## 2012-06-08 MED ORDER — VARDENAFIL HCL 20 MG PO TABS: 20.0000 mg | ORAL_TABLET | Freq: Every day | ORAL | Status: DC | PRN

## 2012-06-08 MED ORDER — SILDENAFIL CITRATE 100 MG PO TABS: 100.0000 mg | ORAL_TABLET | Freq: Every day | ORAL | Status: DC | PRN

## 2012-06-08 NOTE — Patient Instructions (Addendum)
Your physician wants you to follow-up in: 12 months.You will receive a reminder letter in the mail two months in advance. If you don't receive a letter, please call our office to schedule the follow-up appointment.  You have prescriptions for Viagra and Levitra.  Please check with [facility] or insurance to see which they will cover

## 2012-06-08 NOTE — Progress Notes (Signed)
HPI:  66 year old gentleman presenting for followup evaluation. The patient has coronary artery disease status post remote CABG in 1989. He has undergone multiple PCI procedures as well. He had a myocardial infarction in 1995 and was treated with PCI. Lipids from one year ago showed a cholesterol of 150, triglycerides 99, HDL 41, and LDL 89.  Overall he is doing well. He plays golf about 4 times per week without symptoms. He specifically denies chest pain, chest pressure, dyspnea, edema, or palpitations. He has problems with erectile dysfunction and requests medication today. He has taken this in the past and has tolerated it well. He has had no anginal symptoms and has not taken nitroglycerin. He understands the contraindication for nitroglycerin if he were to have chest pain after taking ED medication.  Outpatient Encounter Prescriptions as of 06/08/2012  Medication Sig Dispense Refill  . albuterol (PROAIR HFA) 108 (90 BASE) MCG/ACT inhaler Inhale 2 puffs into the lungs every 6 (six) hours as needed.        Marland Kitchen amitriptyline (ELAVIL) 25 MG tablet Take 25 mg by mouth daily.        Marland Kitchen aspirin 81 MG tablet Take 81 mg by mouth daily.        Marland Kitchen atenolol (TENORMIN) 25 MG tablet Take 25 mg by mouth daily.        . clopidogrel (PLAVIX) 75 MG tablet Take 75 mg by mouth daily.        . ferrous sulfate 325 (65 FE) MG tablet Take 1 tablet (325 mg total) by mouth daily.      Marland Kitchen omeprazole (PRILOSEC) 20 MG capsule Take 20 mg by mouth daily.        . simvastatin (ZOCOR) 80 MG tablet Take 1/2 tablet by mouth daily at bedtime       . sulindac (CLINORIL) 200 MG tablet Take 200 mg by mouth 2 (two) times daily.        . temazepam (RESTORIL) 30 MG capsule Take 30 mg by mouth at bedtime as needed.          Allergies  Allergen Reactions  . Metoprolol     REACTION: severe bradycardia    Past Medical History  Diagnosis Date  . ALLERGIC RHINITIS 08/20/2007  . BENIGN PROSTATIC HYPERTROPHY 08/20/2007  . CORONARY  ARTERY DISEASE 08/20/2007  . ELBOW PAIN, LEFT 04/15/2008  . ERECTILE DYSFUNCTION 08/20/2007  . HYPERLIPIDEMIA TYPE I / IV 09/03/2008  . HYPERTENSION 08/20/2007  . INSOMNIA-SLEEP DISORDER-UNSPEC 03/17/2008  . LOW BACK PAIN 08/20/2007  . MYOCARDIAL INFARCTION, HX OF 08/20/2007  . PREMATURE VENTRICULAR CONTRACTIONS 08/20/2007  . SCROTAL MASS 10/09/2008  . URINARY INCONTINENCE, MALE 10/09/2008  . UTI 04/24/2009  . History of meningitis   . Microcytosis   . Asthma     ROS: Negative except as per HPI  BP 138/90  Pulse 68  Resp 18  Ht 6' (1.829 m)  Wt 111.131 kg (245 lb)  BMI 33.23 kg/m2  SpO2 100%  PHYSICAL EXAM: Pt is alert and oriented, pleasant male in NAD HEENT: normal Neck: JVP - normal, carotids 2+= without bruits Lungs: CTA bilaterally CV: RRR without murmur or gallop Abd: soft, NT, Positive BS, no hepatomegaly Ext: no C/C/E, distal pulses intact and equal Skin: warm/dry no rash  EKG:  Sinus bradycardia 57 beats per minute, age indeterminate inferior infarct.  Myoview stress test 05/16/2012: Overall Impression: Normal stress nuclear study.  LV Ejection Fraction: 54%. LV Wall Motion: NL LV Function; NL Wall Motion  ASSESSMENT AND PLAN: 1. CAD status post CABG. The patient remained stable without anginal symptoms. I reviewed his Myoview scan from last month and it showed no ischemia. His gated left ventricular ejection fraction is 54%. He will continue with his current medical program. His EKG was compared to that from 2011 and is unchanged with an old inferior infarct pattern. We discussed lifetime radiation dose with serial nuclear scans and I have recommended that we just do clinical followup next year. The patient is agreeable with this.  2. Erectile dysfunction. He is going to check an to see if he can get medicines from the [place]. He requests prescriptions for Viagra and generic Levitra, depending on what he is able to get filled at reasonable cost.  3. Essential hypertension.  Blood pressure is controlled on his current medical program.  4. Hyperlipidemia. Lipids reviewed from one year ago. His recent labs will be sent by the [facility] for review.  Tonny Bollman 06/08/2012 5:26 PM

## 2012-08-13 ENCOUNTER — Encounter: Payer: Self-pay | Admitting: Internal Medicine

## 2013-05-13 ENCOUNTER — Other Ambulatory Visit: Payer: Self-pay | Admitting: Family Medicine

## 2013-05-13 DIAGNOSIS — M545 Low back pain: Secondary | ICD-10-CM

## 2013-05-15 ENCOUNTER — Encounter (HOSPITAL_COMMUNITY)
Admission: EM | Disposition: A | Discharge status: Home / Self Care | Payer: Self-pay | Source: Home / Self Care | Attending: Cardiovascular Disease

## 2013-05-15 ENCOUNTER — Emergency Department (HOSPITAL_COMMUNITY): Payer: Medicare Other

## 2013-05-15 ENCOUNTER — Encounter: Payer: Self-pay | Admitting: Internal Medicine

## 2013-05-15 ENCOUNTER — Other Ambulatory Visit: Payer: Self-pay

## 2013-05-15 ENCOUNTER — Encounter (HOSPITAL_COMMUNITY): Payer: Self-pay | Admitting: Emergency Medicine

## 2013-05-15 ENCOUNTER — Inpatient Hospital Stay (HOSPITAL_COMMUNITY)
Admission: EM | Admit: 2013-05-15 | Discharge: 2013-05-16 | DRG: 287 | Disposition: A | Discharge status: Home / Self Care | Payer: Medicare Other | Attending: Cardiovascular Disease | Admitting: Cardiovascular Disease

## 2013-05-15 DIAGNOSIS — I10 Essential (primary) hypertension: Secondary | ICD-10-CM | POA: Diagnosis present

## 2013-05-15 DIAGNOSIS — I200 Unstable angina: Secondary | ICD-10-CM

## 2013-05-15 DIAGNOSIS — E781 Pure hyperglyceridemia: Secondary | ICD-10-CM | POA: Diagnosis present

## 2013-05-15 DIAGNOSIS — K2270 Barrett's esophagus without dysplasia: Secondary | ICD-10-CM | POA: Diagnosis present

## 2013-05-15 DIAGNOSIS — G4733 Obstructive sleep apnea (adult) (pediatric): Secondary | ICD-10-CM

## 2013-05-15 DIAGNOSIS — D72829 Elevated white blood cell count, unspecified: Secondary | ICD-10-CM | POA: Diagnosis present

## 2013-05-15 DIAGNOSIS — I252 Old myocardial infarction: Secondary | ICD-10-CM

## 2013-05-15 DIAGNOSIS — Z7982 Long term (current) use of aspirin: Secondary | ICD-10-CM

## 2013-05-15 DIAGNOSIS — Z6833 Body mass index (BMI) 33.0-33.9, adult: Secondary | ICD-10-CM

## 2013-05-15 DIAGNOSIS — Z8679 Personal history of other diseases of the circulatory system: Secondary | ICD-10-CM

## 2013-05-15 DIAGNOSIS — Z7902 Long term (current) use of antithrombotics/antiplatelets: Secondary | ICD-10-CM

## 2013-05-15 DIAGNOSIS — I2582 Chronic total occlusion of coronary artery: Secondary | ICD-10-CM | POA: Diagnosis present

## 2013-05-15 DIAGNOSIS — Z79899 Other long term (current) drug therapy: Secondary | ICD-10-CM

## 2013-05-15 DIAGNOSIS — R7302 Impaired glucose tolerance (oral): Secondary | ICD-10-CM | POA: Diagnosis present

## 2013-05-15 DIAGNOSIS — E785 Hyperlipidemia, unspecified: Secondary | ICD-10-CM | POA: Diagnosis present

## 2013-05-15 DIAGNOSIS — I2510 Atherosclerotic heart disease of native coronary artery without angina pectoris: Secondary | ICD-10-CM | POA: Diagnosis present

## 2013-05-15 DIAGNOSIS — R7309 Other abnormal glucose: Secondary | ICD-10-CM | POA: Diagnosis present

## 2013-05-15 DIAGNOSIS — Y849 Medical procedure, unspecified as the cause of abnormal reaction of the patient, or of later complication, without mention of misadventure at the time of the procedure: Secondary | ICD-10-CM | POA: Diagnosis present

## 2013-05-15 DIAGNOSIS — I25810 Atherosclerosis of coronary artery bypass graft(s) without angina pectoris: Principal | ICD-10-CM | POA: Diagnosis present

## 2013-05-15 DIAGNOSIS — T82897A Other specified complication of cardiac prosthetic devices, implants and grafts, initial encounter: Secondary | ICD-10-CM | POA: Diagnosis present

## 2013-05-15 HISTORY — DX: Personal history of other diseases of the circulatory system: Z86.79

## 2013-05-15 HISTORY — PX: LEFT HEART CATHETERIZATION WITH CORONARY ANGIOGRAM: SHX5451

## 2013-05-15 HISTORY — DX: Dependence on other enabling machines and devices: Z99.89

## 2013-05-15 HISTORY — DX: Obstructive sleep apnea (adult) (pediatric): G47.33

## 2013-05-15 HISTORY — DX: Unspecified osteoarthritis, unspecified site: M19.90

## 2013-05-15 LAB — BASIC METABOLIC PANEL
CO2: 22 mEq/L (ref 19–32)
Chloride: 102 mEq/L (ref 96–112)
Creatinine, Ser: 0.77 mg/dL (ref 0.50–1.35)
GFR calc Af Amer: >90 mL/min (ref >90)
Sodium: 136 mEq/L (ref 135–145)

## 2013-05-15 LAB — CBC WITH DIFFERENTIAL/PLATELET
Basophils Absolute: 0 10*3/uL (ref 0.0–0.1)
Basophils Relative: 0 % (ref 0–1)
HCT: 44.5 % (ref 39.0–52.0)
Hemoglobin: 15.4 g/dL (ref 13.0–17.0)
Lymphocytes Relative: 11 % — ABNORMAL LOW (ref 12–46)
MCHC: 34.6 g/dL (ref 30.0–36.0)
Monocytes Relative: 6 % (ref 3–12)
Neutro Abs: 10.9 10*3/uL — ABNORMAL HIGH (ref 1.7–7.7)
Neutrophils Relative %: 82 % — ABNORMAL HIGH (ref 43–77)
RDW: 14.9 % (ref 11.5–15.5)
WBC: 13.4 10*3/uL — ABNORMAL HIGH (ref 4.0–10.5)

## 2013-05-15 LAB — LIPID PANEL
LDL Cholesterol: 91 mg/dL (ref 0–99)
Triglycerides: 76 mg/dL (ref <150)
VLDL: 15 mg/dL (ref 0–40)

## 2013-05-15 LAB — APTT: aPTT: 28 seconds (ref 24–37)

## 2013-05-15 LAB — PROTIME-INR: INR: 1.05 (ref 0.00–1.49)

## 2013-05-15 LAB — TROPONIN I
Troponin I: <0.3 ng/mL (ref <0.30)
Troponin I: <0.3 ng/mL (ref <0.30)

## 2013-05-15 LAB — PRO B NATRIURETIC PEPTIDE: Pro B Natriuretic peptide (BNP): 102.1 pg/mL (ref 0–125)

## 2013-05-15 LAB — D-DIMER, QUANTITATIVE: D-Dimer, Quant: <0.27 ug/mL-FEU (ref 0.00–0.48)

## 2013-05-15 LAB — MRSA PCR SCREENING: MRSA by PCR: NEGATIVE

## 2013-05-15 LAB — TSH: TSH: 0.414 u[IU]/mL (ref 0.350–4.500)

## 2013-05-15 LAB — GLUCOSE, CAPILLARY: Glucose-Capillary: 124 mg/dL — ABNORMAL HIGH (ref 70–99)

## 2013-05-15 SURGERY — LEFT HEART CATHETERIZATION WITH CORONARY ANGIOGRAM
Anesthesia: Moderate Sedation | Laterality: Bilateral

## 2013-05-15 MED ORDER — DIAZEPAM 5 MG PO TABS
5.0000 mg | ORAL_TABLET | ORAL | Status: AC
  Administered 2013-05-15: 5 mg via ORAL
  Filled 2013-05-15: qty 1

## 2013-05-15 MED ORDER — HEPARIN BOLUS VIA INFUSION
4000.0000 [IU] | Freq: Once | INTRAVENOUS | Status: AC
  Administered 2013-05-15: 4000 [IU] via INTRAVENOUS

## 2013-05-15 MED ORDER — SODIUM CHLORIDE 0.9 % IJ SOLN: 3.0000 mL | INTRAMUSCULAR | Status: DC | PRN

## 2013-05-15 MED ORDER — HEPARIN (PORCINE) IN NACL 100-0.45 UNIT/ML-% IJ SOLN
1300.0000 [IU]/h | INTRAMUSCULAR | Status: DC
  Administered 2013-05-15 (×3): 1300 [IU]/h via INTRAVENOUS
  Filled 2013-05-15: qty 250

## 2013-05-15 MED ORDER — CLOPIDOGREL BISULFATE 75 MG PO TABS
75.0000 mg | ORAL_TABLET | Freq: Every day | ORAL | Status: DC
  Administered 2013-05-15 – 2013-05-16 (×2): 75 mg via ORAL
  Filled 2013-05-15 (×2): qty 1

## 2013-05-15 MED ORDER — FENTANYL CITRATE 0.05 MG/ML IJ SOLN
INTRAMUSCULAR | Status: AC
  Filled 2013-05-15: qty 2

## 2013-05-15 MED ORDER — ASPIRIN EC 81 MG PO TBEC
81.0000 mg | DELAYED_RELEASE_TABLET | Freq: Every day | ORAL | Status: DC
  Administered 2013-05-16: 09:00:00 81 mg via ORAL
  Filled 2013-05-15: qty 1

## 2013-05-15 MED ORDER — PREDNISONE 50 MG PO TABS
60.0000 mg | ORAL_TABLET | Freq: Every day | ORAL | Status: AC
  Administered 2013-05-15 – 2013-05-16 (×2): 60 mg via ORAL
  Filled 2013-05-15 (×3): qty 1

## 2013-05-15 MED ORDER — PANTOPRAZOLE SODIUM 40 MG PO TBEC
40.0000 mg | DELAYED_RELEASE_TABLET | Freq: Every day | ORAL | Status: DC
  Administered 2013-05-15 – 2013-05-16 (×2): 40 mg via ORAL
  Filled 2013-05-15 (×2): qty 1

## 2013-05-15 MED ORDER — PREDNISONE 10 MG PO TABS: 10.0000 mg | ORAL_TABLET | Freq: Every day | ORAL | Status: DC

## 2013-05-15 MED ORDER — TEMAZEPAM 30 MG PO CAPS: 30.0000 mg | ORAL_CAPSULE | Freq: Every evening | ORAL | Status: DC | PRN

## 2013-05-15 MED ORDER — PREDNISONE 20 MG PO TABS
40.0000 mg | ORAL_TABLET | Freq: Every day | ORAL | Status: DC
  Filled 2013-05-15: qty 2

## 2013-05-15 MED ORDER — PREDNISONE 20 MG PO TABS: 20.0000 mg | ORAL_TABLET | Freq: Every day | ORAL | Status: DC

## 2013-05-15 MED ORDER — PREDNISONE (PAK) 10 MG PO TABS
60.0000 mg | ORAL_TABLET | Freq: Every day | ORAL | Status: DC
  Filled 2013-05-15: qty 21

## 2013-05-15 MED ORDER — ALBUTEROL SULFATE HFA 108 (90 BASE) MCG/ACT IN AERS
2.0000 | INHALATION_SPRAY | Freq: Four times a day (QID) | RESPIRATORY_TRACT | Status: DC | PRN
  Filled 2013-05-15: qty 6.7

## 2013-05-15 MED ORDER — LISINOPRIL 10 MG PO TABS
10.0000 mg | ORAL_TABLET | Freq: Every day | ORAL | Status: DC
  Administered 2013-05-15: 22:00:00 10 mg via ORAL
  Filled 2013-05-15 (×3): qty 1

## 2013-05-15 MED ORDER — ATENOLOL 25 MG PO TABS
25.0000 mg | ORAL_TABLET | Freq: Every day | ORAL | Status: DC
  Filled 2013-05-15: qty 1

## 2013-05-15 MED ORDER — AMITRIPTYLINE HCL 25 MG PO TABS
25.0000 mg | ORAL_TABLET | Freq: Every day | ORAL | Status: DC
  Administered 2013-05-15: 25 mg via ORAL
  Filled 2013-05-15 (×3): qty 1

## 2013-05-15 MED ORDER — HEPARIN (PORCINE) IN NACL 2-0.9 UNIT/ML-% IJ SOLN
INTRAMUSCULAR | Status: AC
  Filled 2013-05-15: qty 1000

## 2013-05-15 MED ORDER — MIDAZOLAM HCL 2 MG/2ML IJ SOLN
INTRAMUSCULAR | Status: AC
  Filled 2013-05-15: qty 2

## 2013-05-15 MED ORDER — SULINDAC 200 MG PO TABS
200.0000 mg | ORAL_TABLET | Freq: Two times a day (BID) | ORAL | Status: DC
  Administered 2013-05-15 – 2013-05-16 (×3): 200 mg via ORAL
  Filled 2013-05-15 (×5): qty 1

## 2013-05-15 MED ORDER — LIDOCAINE HCL (PF) 1 % IJ SOLN
INTRAMUSCULAR | Status: AC
  Filled 2013-05-15: qty 30

## 2013-05-15 MED ORDER — ACETAMINOPHEN 325 MG PO TABS: 650.0000 mg | ORAL_TABLET | ORAL | Status: DC | PRN

## 2013-05-15 MED ORDER — SODIUM CHLORIDE 0.9 % IV SOLN: INTRAVENOUS | Status: DC

## 2013-05-15 MED ORDER — SODIUM CHLORIDE 0.9 % IJ SOLN: 3.0000 mL | Freq: Two times a day (BID) | INTRAMUSCULAR | Status: DC

## 2013-05-15 MED ORDER — NITROGLYCERIN IN D5W 200-5 MCG/ML-% IV SOLN
10.0000 ug/min | INTRAVENOUS | Status: DC
  Administered 2013-05-15: 10 ug/min via INTRAVENOUS
  Filled 2013-05-15: qty 250

## 2013-05-15 MED ORDER — SODIUM CHLORIDE 0.9 % IV SOLN: 250.0000 mL | INTRAVENOUS | Status: DC | PRN

## 2013-05-15 MED ORDER — ATENOLOL 50 MG PO TABS
50.0000 mg | ORAL_TABLET | Freq: Every day | ORAL | Status: DC
  Administered 2013-05-15: 23:00:00 50 mg via ORAL
  Filled 2013-05-15 (×3): qty 1

## 2013-05-15 MED ORDER — VITAMIN E 180 MG (400 UNIT) PO CAPS
400.0000 [IU] | ORAL_CAPSULE | Freq: Every evening | ORAL | Status: DC
  Administered 2013-05-15: 400 [IU] via ORAL
  Filled 2013-05-15 (×3): qty 1

## 2013-05-15 MED ORDER — SODIUM CHLORIDE 0.9 % IV SOLN: INTRAVENOUS | Status: AC

## 2013-05-15 MED ORDER — NITROGLYCERIN 0.2 MG/ML ON CALL CATH LAB
INTRAVENOUS | Status: AC
  Filled 2013-05-15: qty 1

## 2013-05-15 MED ORDER — ADULT MULTIVITAMIN W/MINERALS CH
1.0000 | ORAL_TABLET | Freq: Every morning | ORAL | Status: DC
  Administered 2013-05-15 – 2013-05-16 (×2): 1 via ORAL
  Filled 2013-05-15 (×2): qty 1

## 2013-05-15 MED ORDER — SODIUM CHLORIDE 0.9 % IV SOLN
INTRAVENOUS | Status: DC
  Administered 2013-05-15: 06:00:00 via INTRAVENOUS

## 2013-05-15 MED ORDER — ONDANSETRON HCL 4 MG/2ML IJ SOLN: 4.0000 mg | Freq: Four times a day (QID) | INTRAMUSCULAR | Status: DC | PRN

## 2013-05-15 MED ORDER — ASPIRIN 81 MG PO CHEW
324.0000 mg | CHEWABLE_TABLET | Freq: Once | ORAL | Status: AC
  Administered 2013-05-15: 324 mg via ORAL
  Filled 2013-05-15: qty 4

## 2013-05-15 MED ORDER — INSULIN ASPART 100 UNIT/ML ~~LOC~~ SOLN
0.0000 [IU] | Freq: Three times a day (TID) | SUBCUTANEOUS | Status: DC
  Administered 2013-05-15: 1 [IU] via SUBCUTANEOUS
  Administered 2013-05-15: 17:00:00 2 [IU] via SUBCUTANEOUS
  Administered 2013-05-16: 09:00:00 1 [IU] via SUBCUTANEOUS

## 2013-05-15 MED ORDER — INSULIN ASPART 100 UNIT/ML ~~LOC~~ SOLN: 0.0000 [IU] | Freq: Every day | SUBCUTANEOUS | Status: DC

## 2013-05-15 MED ORDER — SIMVASTATIN 40 MG PO TABS
40.0000 mg | ORAL_TABLET | Freq: Every day | ORAL | Status: DC
  Administered 2013-05-15: 18:00:00 40 mg via ORAL
  Filled 2013-05-15 (×3): qty 1

## 2013-05-15 NOTE — ED Notes (Signed)
PT. REPORTS INTERMITTENT LEFT CHEST PAIN FOR 1 WEEK WITH SLIGHT SOB , DENIES NAUSEA OR DIAPHORESIS , PT. STATED HISTORY OF CAD / CABG - HIS CARDIOLOGIST IS DR. Judie Petit. Excell Seltzer.

## 2013-05-15 NOTE — CV Procedure (Signed)
    Cardiac Catheterization Procedure Note  Name: Robert [name] MRN: [id number] DOB: 01/23/1946  Procedure: Left Heart Cath, Selective Coronary Angiography, SVG/LIMA angiography and LV angiography  Indication: Unstable angina with known history of coronary artery disease status post CABG and multiple PCI's.   Medications:  Sedation:  2 mg IV Versed, 50 mcg IV Fentanyl  Contrast:  95 ml  Omnipaque  Procedural details: The right groin was prepped, draped, and anesthetized with 1% lidocaine. Using modified Seldinger technique, a 5 French sheath was introduced into the right femoral artery. Standard Judkins catheters were used for coronary angiography and left ventriculography. A JR 4 catheter was used to image the LIMA. An AR-1 catheter was used to engage the SVG to RCA. Catheter exchanges were performed over a guidewire. There were no immediate procedural complications. The patient was transferred to the post catheterization recovery area for further monitoring.   Procedural Findings:  Hemodynamics: AO:  129/75   mmHg LV:  131/8    mmHg LVEDP: 20  mmHg  Coronary angiography: Coronary dominance: Right   Left Main:  Normal in size with no significant disease.  Left Anterior Descending (LAD): Chronically Occluded at the ostium.  Circumflex (LCx):  Normal in size and nondominant. The vessel is mildly calcified. No stent is noted proximally which is patent with minor 10% instent restenosis. There is a 40% discrete lesion in the midsegment. Otherwise no evidence of obstructive disease.  1st obtuse marginal:  Small in size with minor irregularities.  2nd obtuse marginal:  Large in size with no significant disease.  3rd obtuse marginal:  Small in size with no significant disease.   Ramus Intermedius:  Medium in size with minor irregularities.  Right Coronary Artery: Normal in size and dominant. There is an 80-90% disease proximally followed by total occlusion in the  midsegment.  LIMA to LAD: widely patent with no significant disease. The LAD fills the diagonals retrograde.  SVG to RCA, patent with a stent noted at the ostium. The stent has stable 20-30% instent restenosis which is unchanged.  SVG to diagonal: Chronically occluded at the ostium.  Left ventriculography: Left ventricular systolic function is normal , LVEF is estimated at 60 %, there is no significant mitral regurgitation   Final Conclusions:   1. Significant three-vessel coronary artery disease with patent LIMA to LAD and SVG to RCA. Chronically occluded SVG to diagonal. Patent stent in the native left circumflex. No significant change in anatomy since most recent cardiac catheterization. 2. Normal LV systolic function.  Recommendations:  There is no clear culprit for unstable angina. I will increase dose of atenolol. I will also check d-dimer level although his symptoms are not typical for pulmonary embolism. Monitor overnight.  Lorine Bears MD, [facility] 05/15/2013, 12:31 PM

## 2013-05-15 NOTE — H&P (View-Only) (Signed)
Patient: Robert [name] / Admit Date: 05/15/2013 / Date of Encounter: 05/15/2013, 6:44 AM   Subjective  Feels well without CP or SOB since being placed on NTG gtt. Reports several weeks of exertional CP, relieved with rest and NTG similar to prior angina, worsening last night.   Objective   Telemetry: NSR Physical Exam: Filed Vitals:   05/15/13 0600  BP: 142/73  Pulse: 73  Temp: 97.8 F (36.6 C)  Resp: 20   General: Well developed, well nourished WM in no acute distress. Head: Normocephalic, atraumatic, sclera non-icteric, no xanthomas, nares are without discharge. Neck: Negative for carotid bruits. JVD not elevated. Lungs: Clear bilaterally to auscultation without wheezes, rales, or rhonchi. Breathing is unlabored. Heart: RRR S1 S2 without murmurs, rubs, or gallops.  Abdomen: Soft, non-tender, non-distended with normoactive bowel sounds. No hepatomegaly. No rebound/guarding. No obvious abdominal masses. Msk:  Strength and tone appear normal for age. Extremities: No clubbing or cyanosis. No edema.  Distal pedal pulses are 2+ and equal bilaterally. Neuro: Alert and oriented X 3. Moves all extremities spontaneously. Psych:  Responds to questions appropriately with a normal affect.    Intake/Output Summary (Last 24 hours) at 05/15/13 0644 Last data filed at 05/15/13 0600  Gross per 24 hour  Intake   61.5 ml  Output      0 ml  Net   61.5 ml    Inpatient Medications:  . amitriptyline  25 mg Oral QHS  . [START ON 05/16/2013] aspirin EC  81 mg Oral Daily  . atenolol  25 mg Oral QHS  . clopidogrel  75 mg Oral Daily  . insulin aspart  0-5 Units Subcutaneous QHS  . insulin aspart  0-9 Units Subcutaneous TID WC  . lisinopril  10 mg Oral QHS  . multivitamin with minerals  1 tablet Oral q morning - 10a  . pantoprazole  40 mg Oral Daily  . [START ON 05/23/2013] predniSONE  10 mg Oral Q breakfast  . [START ON 05/20/2013] predniSONE  20 mg Oral Q breakfast  . [START ON 05/17/2013]  predniSONE  40 mg Oral Q breakfast  . predniSONE  60 mg Oral Q breakfast  . simvastatin  40 mg Oral q1800  . sulindac  200 mg Oral BID  . vitamin E  400 Units Oral QPM    Labs:  Recent Labs  05/15/13 0318  NA 136  K 3.8  CL 102  CO2 22  GLUCOSE 196*  BUN 24*  CREATININE 0.77  CALCIUM 9.0    Recent Labs  05/15/13 0344  WBC 13.4*  NEUTROABS 10.9*  HGB 15.4  HCT 44.5  MCV 76.6*  PLT 201    Recent Labs  05/15/13 0318  TROPONINI <0.30    Radiology/Studies:  Dg Chest 2 View  05/15/2013   *RADIOLOGY REPORT*  Clinical Data: Chest pain  CHEST - 2 VIEW  Comparison: 03/08/2004  Findings: Mild cardiomegaly.  Scattered atherosclerosis of the aorta.  Status post median sternotomy and CABG.  No consolidation, pleural effusion, or pneumothorax.  Mild multilevel degenerative change.  IMPRESSION: Cardiomegaly.  No focal consolidation.   Original Report Authenticated By: Jearld Lesch, M.D.     Assessment and Plan  1. CP concerning for [place], with known history of CAD s/p remote CABG & stenting 2. Hyperlipidemia 3. Hypertension 4. Osteoarthritis of lower back, placed on predisone taper yesterday by ortho 5. Leukocytosis, may be related to prednisone taper 6. Hyperglycemia, A1C pending 7. Obesity BMI 33.4  Likely needs  cardiac cath today. Pt has been made NPO. Risks and benefits of cardiac catheterization have been discussed with the patient. These include bleeding, infection, kidney damage, stroke, heart attack, death. The patient understands these risks and is willing to proceed if Dr. Excell Seltzer agrees. Continue ASA, BB, statin, Plavix, heparin & NTG gtts. Consider P2Y12.  Signed, Ronie Spies PA-C  Patient seen, examined. Available data reviewed. Agree with findings, assessment, and plan as outlined by Ronie Spies, PA-C. Pt has been stable x several years after remote CABG and PCI procedures. Last cath 2008 was reviewed. He has 2 weeks of crescendo angina, typical symptoms of USAP.  Exam reveals alert, oriented male in NAD, lungs clear, heart RRR without murmur/gallop, no peripheral edema. Agree with plan as above. Reviewed risks, indication, alternatives to cath and PCI. He understands and agrees to proceed.  Tonny Bollman, M.D. 05/15/2013 9:09 AM

## 2013-05-15 NOTE — Progress Notes (Signed)
ANTICOAGULATION CONSULT NOTE - Initial Consult  Pharmacy Consult for Heparin Indication: chest pain/ACS  Allergies  Allergen Reactions  . Metoprolol     REACTION: severe bradycardia    Patient Measurements: Height: 6' 0.05" (183 cm) Weight: 240 lb (108.863 kg) IBW/kg (Calculated) : 77.71 Heparin Dosing Weight: 100 kg   Vital Signs: Temp: 98.6 F (37 C) (08/27 0320) Temp src: Oral (08/27 0320) BP: 142/81 mmHg (08/27 0320) Pulse Rate: 79 (08/27 0320)  Labs:  Recent Labs  05/15/13 0344  HGB 15.4  HCT 44.5  PLT 201    Estimated Creatinine Clearance: 92.7 ml/min (by C-G formula based on Cr of 1).   Medical History: Past Medical History  Diagnosis Date  . ALLERGIC RHINITIS 08/20/2007  . BENIGN PROSTATIC HYPERTROPHY 08/20/2007  . CORONARY ARTERY DISEASE 08/20/2007  . ELBOW PAIN, LEFT 04/15/2008  . ERECTILE DYSFUNCTION 08/20/2007  . HYPERLIPIDEMIA TYPE I / IV 09/03/2008  . HYPERTENSION 08/20/2007  . INSOMNIA-SLEEP DISORDER-UNSPEC 03/17/2008  . LOW BACK PAIN 08/20/2007  . MYOCARDIAL INFARCTION, HX OF 08/20/2007  . PREMATURE VENTRICULAR CONTRACTIONS 08/20/2007  . SCROTAL MASS 10/09/2008  . URINARY INCONTINENCE, MALE 10/09/2008  . UTI 04/24/2009  . History of meningitis   . Microcytosis   . Asthma     Medications:  Albuterol  Elavil  ASA  Atenolol  Plavix  Iron  Prilosec  Viagra  Zocor  Clinoril  Restoril  Levitra  Assessment: 67 y.o. male with chest pain for heparin   Goal of Therapy:  Heparin level 0.3-0.7 units/ml Monitor platelets by anticoagulation protocol: Yes   Plan:  Heparin 4000 units IV bolus, then 1300 units/hr Check heparin level in 6 hours.   Eddie Candle 05/15/2013,4:11 AM

## 2013-05-15 NOTE — ED Notes (Signed)
Hand off report given to floor at this time.

## 2013-05-15 NOTE — Interval H&P Note (Signed)
Cath Lab Visit (complete for each Cath Lab visit)  Clinical Evaluation Leading to the Procedure:   ACS: yes  Non-ACS:    Anginal Classification: CCS IV  Anti-ischemic medical therapy: Maximal Therapy (2 or more classes of medications)  Non-Invasive Test Results: No non-invasive testing performed  Prior CABG: Previous CABG      History and Physical Interval Note:  05/15/2013 11:35 AM  Robert [name]  has presented today for surgery, with the diagnosis of cad  The various methods of treatment have been discussed with the patient and family. After consideration of risks, benefits and other options for treatment, the patient has consented to  Procedure(s): LEFT HEART CATHETERIZATION WITH CORONARY ANGIOGRAM (Bilateral) as a surgical intervention .  The patient's history has been reviewed, patient examined, no change in status, stable for surgery.  I have reviewed the patient's chart and labs.  Questions were answered to the patient's satisfaction.     Lorine Bears

## 2013-05-15 NOTE — ED Notes (Signed)
Pt states for a few weeks he has been experiencing chest pain that worsened with activity. Pt found relief with rest. This week pt noticed it getting worse and tonight it began radiating to L arm, accompanied with shortness of breath and sweating. Pt states he is not short of breath at present time and skin is clean and dry. Pt denies any limb radiation of pain at present.

## 2013-05-15 NOTE — Progress Notes (Signed)
Not coming back to 2900, all belongings sent to cath lab, holding room.

## 2013-05-15 NOTE — H&P (Addendum)
Cardiology History and Physical  Oliver Barre, MD  History of Present Illness (and review of medical records): Robert [name] is a 67 y.o. male who presents for evaluation of chest pain.  He is followed by Dr. Tonny Bollman.  The patient has coronary artery disease status post remote CABG in 1989. He has undergone multiple PCI procedures as well. He had a myocardial infarction in 1995 and was treated with PCI.  He was last seen in clinic 05/2012 without any anginal symptoms and had myoview stress test one month prior with no ischemia.  He now presents with his return of angina over the past 3weeks.  He has midsternal chest pain that at times feels sharp or dull.  This pain is associated with shortness of breath, diaphoresis.  Pain is with mild exertion during ADLs and is relieved by rest or NTG.  He reports probably taking a NTG at least once daily over past few weeks, where normally he may have needed it 1-2/yr.  Pain was severely worse last night around 11pm.  He rated pain 8/10 and was unable to go to sleep.  Pain persisted until 2am and he then decided to come into to ED to be evaluated.  He was given 4 ASA and started on Nitro and Heparin gtts.  He is now comfortable and without chest pain.  Cardiac Cath 03/09/2004 RESULTS: The aortic pressure was 122/78 with mean of 97 and left  ventricular pressure was 122/22.  The left main coronary artery was free of significant disease.  Left anterior descending artery: Left anterior descending artery was  completely occluded at its origin.  Circumflex artery: The circumflex artery gave rise to a small ramus branch  and AV branch which gave rise to a marginal branch and an atrial branch.  There was less than 10% stenosis at the stent site in the proximal  circumflex artery and no other significant obstruction.  Right coronary artery: The right coronary artery was completely occluded at  its mid portion. There was an 80% proximal stenosis.  The  saphenous vein graft to the distal right coronary artery was patent.  There was 40% narrowing within the overlapping stents in the proximal right  coronary artery. Distal anastomosis was irregular, but no significant  obstruction. The distal vessel gave rise to posterior descending and three  posterior lateral branches which were free of significant obstruction.  The LIMA graft to the LAD was patent and functioned normal. The saphenous  vein graft to the diagonal branch of the LAD was completely occluded by  prior study.  LEFT VENTRICULOGRAM: The left ventriculogram performed in the RAO  projection showed hypokinesis of the inferior basal wall. The overall wall  motion was good with estimated ejection fraction of 60%.  CONCLUSIONS:  1. Coronary artery disease status post coronary artery bypass graft surgery  in 1989.  2. Severe native vessel disease with total occlusion of the left anterior  descending and right coronary artery and less than 10% narrowing at the  stent site in the proximal circumflex artery.  3. Patent left internal mammary artery graft to the left anterior  descending, 40% narrowing at the stent site at the ostium of the vein  graft to the right coronary artery and total occlusion of the vein graft  to the diagonal branch of the left anterior descending (old).  4. Inferobasal wall hypokinesis with an estimated ejection fraction of 60%.   Myoview stress test 05/16/2012: Overall Impression: Normal stress nuclear study.  LV Ejection Fraction: 54%. LV Wall Motion: NL LV Function; NL Wall Motion    Review of Systems Further review of systems was otherwise negative other than stated in HPI.  Patient Active Problem List   Diagnosis Date Noted  . Barrett's esophagus 09/12/2011  . Iron deficiency anemia, unspecified 08/24/2011  . Long term current use of Plavix and aspirin due to coronary artery disease 08/19/2011  . Personal history of adenomatous colonic polyps  04/30/2011  . Impaired glucose tolerance 04/29/2011  . Microcytosis 04/29/2011  . Preventative health care 04/29/2011  . SCROTAL MASS 10/09/2008  . URINARY INCONTINENCE, MALE 10/09/2008  . HYPERLIPIDEMIA TYPE I / IV 09/03/2008  . ELBOW PAIN, LEFT 04/15/2008  . INSOMNIA-SLEEP DISORDER-UNSPEC 03/17/2008  . HYPERLIPIDEMIA 08/20/2007  . ERECTILE DYSFUNCTION 08/20/2007  . HYPERTENSION 08/20/2007  . Coronary atherosclerosis of autologous vein bypass graft 08/20/2007  . PREMATURE VENTRICULAR CONTRACTIONS 08/20/2007  . ALLERGIC RHINITIS 08/20/2007  . BENIGN PROSTATIC HYPERTROPHY 08/20/2007  . LOW BACK PAIN 08/20/2007   Past Medical History  Diagnosis Date  . ALLERGIC RHINITIS 08/20/2007  . BENIGN PROSTATIC HYPERTROPHY 08/20/2007  . CORONARY ARTERY DISEASE 08/20/2007  . ELBOW PAIN, LEFT 04/15/2008  . ERECTILE DYSFUNCTION 08/20/2007  . HYPERLIPIDEMIA TYPE I / IV 09/03/2008  . HYPERTENSION 08/20/2007  . INSOMNIA-SLEEP DISORDER-UNSPEC 03/17/2008  . LOW BACK PAIN 08/20/2007  . MYOCARDIAL INFARCTION, HX OF 08/20/2007  . PREMATURE VENTRICULAR CONTRACTIONS 08/20/2007  . SCROTAL MASS 10/09/2008  . URINARY INCONTINENCE, MALE 10/09/2008  . UTI 04/24/2009  . History of meningitis   . Microcytosis   . Asthma     Past Surgical History  Procedure Laterality Date  . Coronary artery bypass graft  1989  . Stent x3  [id number] and 2000  . Transurethral resection of prostate  10/02/2009    Gyrus transurethral resection   . Cystoscopy  08/07/2009    and right hydrocelectomy  . Colonoscopy w/ biopsies and polypectomy  11/20/2003    adenomatous polyp, internal and external hemorrhoids  . Turp vaporization    . Tendon repair      left foot  . Inguinal hernia repair      X3  . Rotator cuff repair  2009    [facility]ioffre    Prescriptions prior to admission  Medication Sig Dispense Refill  . albuterol (PROAIR HFA) 108 (90 BASE) MCG/ACT inhaler Inhale 2 puffs into the lungs every 6 (six) hours as needed.        Marland Kitchen  amitriptyline (ELAVIL) 25 MG tablet Take 25 mg by mouth at bedtime.       Marland Kitchen aspirin 81 MG tablet Take 81 mg by mouth daily.        Marland Kitchen atenolol (TENORMIN) 25 MG tablet Take 25 mg by mouth at bedtime.       . clopidogrel (PLAVIX) 75 MG tablet Take 75 mg by mouth daily.        Marland Kitchen lisinopril (PRINIVIL,ZESTRIL) 10 MG tablet Take 10 mg by mouth at bedtime.      . Multiple Vitamin (MULTIVITAMIN WITH MINERALS) TABS tablet Take 1 tablet by mouth every morning.      Marland Kitchen omeprazole (PRILOSEC) 20 MG capsule Take 20 mg by mouth daily.        . predniSONE (STERAPRED UNI-PAK) 10 MG tablet Take 10-60 mg by mouth daily. 12 day taper dose. START 8.26.14 END 9.7.14      . simvastatin (ZOCOR) 80 MG tablet Take 40 mg by mouth at bedtime.       Marland Kitchen  sulindac (CLINORIL) 200 MG tablet Take 200 mg by mouth 2 (two) times daily.        . temazepam (RESTORIL) 30 MG capsule Take 30 mg by mouth at bedtime as needed for sleep.       . vitamin E 400 UNIT capsule Take 400 Units by mouth every evening.       Allergies  Allergen Reactions  . Metoprolol     REACTION: severe bradycardia    History  Substance Use Topics  . Smoking status: Former Games developer  . Smokeless tobacco: Never Used  . Alcohol Use: No    Family History  Problem Relation Age of Onset  . Heart disease Brother   . Esophageal cancer Brother   . Coronary artery disease Father   . Coronary artery disease Mother     pacemaker     Objective:  Patient Vitals for the past 8 hrs:  BP Temp Temp src Pulse Resp SpO2 Height Weight  05/15/13 0533 - - - - - - 6' (1.829 m) 111.6 kg (246 lb 0.5 oz)  05/15/13 0500 138/81 mmHg - - 69 19 93 % - -  05/15/13 0445 141/79 mmHg - - 69 20 95 % - -  05/15/13 0443 135/76 mmHg - - 71 17 94 % - -  05/15/13 0430 134/75 mmHg - - 69 21 95 % - -  05/15/13 0415 144/77 mmHg - - 71 23 95 % - -  05/15/13 0412 - - - - - - - 108.863 kg (240 lb)  05/15/13 0346 - - - - - - 6' 0.05" (1.83 m) 108.863 kg (240 lb)  05/15/13 0320 142/81 mmHg  98.6 F (37 C) Oral 79 16 94 % - -   General appearance: alert, cooperative, appears stated age and no distress Head: Normocephalic, without obvious abnormality, atraumatic Eyes: conjunctivae/corneas clear. PERRL, EOM's intact. Fundi benign. Neck: no carotid bruit, no JVD and supple, symmetrical, trachea midline Lungs: clear to auscultation bilaterally Chest wall: no tenderness Heart: regular rate and rhythm, S1, S2 normal, no murmur, click, rub or gallop Abdomen: soft, non-tender; bowel sounds normal; no masses,  no organomegaly Extremities: extremities normal, trace edema Pulses: 2+ and symmetric Neurologic: Grossly normal  Results for orders placed during the hospital encounter of 05/15/13 (from the past 48 hour(s))  BASIC METABOLIC PANEL     Status: Abnormal   Collection Time    05/15/13  3:18 AM      Result Value Range   Sodium 136  135 - 145 mEq/L   Potassium 3.8  3.5 - 5.1 mEq/L   Chloride 102  96 - 112 mEq/L   CO2 22  19 - 32 mEq/L   Glucose, Bld 196 (*) 70 - 99 mg/dL   BUN 24 (*) 6 - 23 mg/dL   Creatinine, Ser 7.82  0.50 - 1.35 mg/dL   Calcium 9.0  8.4 - 95.6 mg/dL   GFR calc non Af Amer >90  >90 mL/min   GFR calc Af Amer >90  >90 mL/min   Comment: (NOTE)     The eGFR has been calculated using the CKD EPI equation.     This calculation has not been validated in all clinical situations.     eGFR's persistently <90 mL/min signify possible Chronic Kidney     Disease.  PRO B NATRIURETIC PEPTIDE     Status: None   Collection Time    05/15/13  3:18 AM      Result Value Range  Pro B Natriuretic peptide (BNP) 102.1  0 - 125 pg/mL  TROPONIN I     Status: None   Collection Time    05/15/13  3:18 AM      Result Value Range   Troponin I <0.30  <0.30 ng/mL   Comment:            Due to the release kinetics of cTnI,     a negative result within the first hours     of the onset of symptoms does not rule out     myocardial infarction with certainty.     If myocardial  infarction is still suspected,     repeat the test at appropriate intervals.  CBC WITH DIFFERENTIAL     Status: Abnormal   Collection Time    05/15/13  3:44 AM      Result Value Range   WBC 13.4 (*) 4.0 - 10.5 K/uL   RBC 5.81  4.22 - 5.81 MIL/uL   Hemoglobin 15.4  13.0 - 17.0 g/dL   HCT 16.1  09.6 - 04.5 %   MCV 76.6 (*) 78.0 - 100.0 fL   MCH 26.5  26.0 - 34.0 pg   MCHC 34.6  30.0 - 36.0 g/dL   RDW 40.9  81.1 - 91.4 %   Platelets 201  150 - 400 K/uL   Neutrophils Relative % 82 (*) 43 - 77 %   Neutro Abs 10.9 (*) 1.7 - 7.7 K/uL   Lymphocytes Relative 11 (*) 12 - 46 %   Lymphs Abs 1.5  0.7 - 4.0 K/uL   Monocytes Relative 6  3 - 12 %   Monocytes Absolute 0.8  0.1 - 1.0 K/uL   Eosinophils Relative 1  0 - 5 %   Eosinophils Absolute 0.1  0.0 - 0.7 K/uL   Basophils Relative 0  0 - 1 %   Basophils Absolute 0.0  0.0 - 0.1 K/uL  POCT I-STAT TROPONIN I     Status: None   Collection Time    05/15/13  3:49 AM      Result Value Range   Troponin i, poc 0.02  0.00 - 0.08 ng/mL   Comment 3            Comment: Due to the release kinetics of cTnI,     a negative result within the first hours     of the onset of symptoms does not rule out     myocardial infarction with certainty.     If myocardial infarction is still suspected,     repeat the test at appropriate intervals.  PROTIME-INR     Status: None   Collection Time    05/15/13  3:56 AM      Result Value Range   Prothrombin Time 13.5  11.6 - 15.2 seconds   INR 1.05  0.00 - 1.49  APTT     Status: None   Collection Time    05/15/13  3:56 AM      Result Value Range   aPTT 28  24 - 37 seconds   Dg Chest 2 View  05/15/2013   *RADIOLOGY REPORT*  Clinical Data: Chest pain  CHEST - 2 VIEW  Comparison: 03/08/2004  Findings: Mild cardiomegaly.  Scattered atherosclerosis of the aorta.  Status post median sternotomy and CABG.  No consolidation, pleural effusion, or pneumothorax.  Mild multilevel degenerative change.  IMPRESSION: Cardiomegaly.   No focal consolidation.   Original Report Authenticated By: Jearld Lesch,  M.D.    ECG:  Sinus rhythm with freq PVCs, probable old inferior infarct, seen on prior ecg reveiwed  Assessment: 72M hx of CABG 26yrs ago, multiple PCIs, last MI in '95, HTN, HLD, prediabetes, with normal myoview 04/2012 presents with Unstable Angina.    Plan:  1. Cardiology Admission  2. Continuous monitoring on Telemetry. 3. Repeat ekg on admit, prn chest pain or arrythmia 4. Trend cardiac biomarkers to rule out MI, check lipids, hgba1c, tsh 5. Medical management to include ASA, Plavix,  Heparin gtt, Nitro gtt. 6. Continue home meds Lisinopril, Atenolol, simvastatin 7. SSI 8. Will keep NPO at this time for possible ischemic evaluation with cardiac catheterization. 9. Discussed plan with patient and wife and bedside.

## 2013-05-15 NOTE — ED Provider Notes (Signed)
CSN: [id number]     Arrival date & time 05/15/13  9147 History   First MD Initiated Contact with Patient 05/15/13 [phone]     Chief Complaint  Patient presents with  . Chest Pain   (Consider location/radiation/quality/duration/timing/severity/associated sxs/prior Treatment) Patient is a 67 y.o. male presenting with chest pain. The history is provided by the patient.  Chest Pain He has been having exertional chest pain for the last 2 weeks. Pain is sharp and left-sided without radiation. It can be as severe as 8/10. There is associated dyspnea but no nausea or diaphoresis. Pain is relieved by nitroglycerin. Tonight, he was unable to sleep because of the pain so he came into the ED. Pain was relieved by 2 nitroglycerin tonight but then recurred when he walked from his car to the ED and drinks-a distance of about 50 yards. He is currently pain free. He has history of coronary artery bypass 25 years ago and 3 stents. Prior to the last 2 weeks, he states that he would only use nitroglycerin once or twice a year.  Past Medical History  Diagnosis Date  . ALLERGIC RHINITIS 08/20/2007  . BENIGN PROSTATIC HYPERTROPHY 08/20/2007  . CORONARY ARTERY DISEASE 08/20/2007  . ELBOW PAIN, LEFT 04/15/2008  . ERECTILE DYSFUNCTION 08/20/2007  . HYPERLIPIDEMIA TYPE I / IV 09/03/2008  . HYPERTENSION 08/20/2007  . INSOMNIA-SLEEP DISORDER-UNSPEC 03/17/2008  . LOW BACK PAIN 08/20/2007  . MYOCARDIAL INFARCTION, HX OF 08/20/2007  . PREMATURE VENTRICULAR CONTRACTIONS 08/20/2007  . SCROTAL MASS 10/09/2008  . URINARY INCONTINENCE, MALE 10/09/2008  . UTI 04/24/2009  . History of meningitis   . Microcytosis   . Asthma    Past Surgical History  Procedure Laterality Date  . Coronary artery bypass graft  1989  . Stent x3  [id number] and 2000  . Transurethral resection of prostate  10/02/2009    Gyrus transurethral resection   . Cystoscopy  08/07/2009    and right hydrocelectomy  . Colonoscopy w/ biopsies and polypectomy  11/20/2003     adenomatous polyp, internal and external hemorrhoids  . Turp vaporization    . Tendon repair      left foot  . Inguinal hernia repair      X3  . Rotator cuff repair  2009    Gioffre   Family History  Problem Relation Age of Onset  . Heart disease Brother   . Esophageal cancer Brother   . Coronary artery disease Father   . Coronary artery disease Mother     pacemaker   History  Substance Use Topics  . Smoking status: Former Games developer  . Smokeless tobacco: Never Used  . Alcohol Use: No    Review of Systems  Cardiovascular: Positive for chest pain.  All other systems reviewed and are negative.    Allergies  Metoprolol  Home Medications   Current Outpatient Rx  Name  Route  Sig  Dispense  Refill  . albuterol (PROAIR HFA) 108 (90 BASE) MCG/ACT inhaler   Inhalation   Inhale 2 puffs into the lungs every 6 (six) hours as needed.           Marland Kitchen amitriptyline (ELAVIL) 25 MG tablet   Oral   Take 25 mg by mouth daily.           Marland Kitchen aspirin 81 MG tablet   Oral   Take 81 mg by mouth daily.           Marland Kitchen atenolol (TENORMIN) 25 MG tablet   Oral  Take 25 mg by mouth daily.           . clopidogrel (PLAVIX) 75 MG tablet   Oral   Take 75 mg by mouth daily.           Marland Kitchen EXPIRED: ferrous sulfate 325 (65 FE) MG tablet   Oral   Take 1 tablet (325 mg total) by mouth daily.         Marland Kitchen omeprazole (PRILOSEC) 20 MG capsule   Oral   Take 20 mg by mouth daily.           . sildenafil (VIAGRA) 100 MG tablet   Oral   Take 1 tablet (100 mg total) by mouth daily as needed for erectile dysfunction.   15 tablet   2   . simvastatin (ZOCOR) 80 MG tablet      Take 1/2 tablet by mouth daily at bedtime          . sulindac (CLINORIL) 200 MG tablet   Oral   Take 200 mg by mouth 2 (two) times daily.           . temazepam (RESTORIL) 30 MG capsule   Oral   Take 30 mg by mouth at bedtime as needed.           . vardenafil (LEVITRA) 20 MG tablet   Oral   Take 1 tablet  (20 mg total) by mouth daily as needed for erectile dysfunction.   15 tablet   2    BP 142/81  Pulse 79  Temp(Src) 98.6 F (37 C) (Oral)  Resp 16  SpO2 94% Physical Exam  Nursing note and vitals reviewed.  67 year old male, resting comfortably and in no acute distress. Vital signs are significant for borderline hypertension with blood pressure 142/81. Oxygen saturation is 94%, which is normal. Head is normocephalic and atraumatic. PERRLA, EOMI. Oropharynx is clear. Neck is nontender and supple without adenopathy or JVD. Back is nontender and there is no CVA tenderness. Lungs are clear without rales, wheezes, or rhonchi. Chest is nontender. Heart has regular rate and rhythm without murmur. Abdomen is soft, flat, nontender without masses or hepatosplenomegaly and peristalsis is normoactive. Extremities have no cyanosis or edema, full range of motion is present. Skin is warm and dry without rash. Neurologic: Mental status is normal, cranial nerves are intact, there are no motor or sensory deficits.  ED Course  Procedures (including critical care time) Labs Review Results for orders placed during the hospital encounter of 05/15/13  BASIC METABOLIC PANEL      Result Value Range   Sodium 136  135 - 145 mEq/L   Potassium 3.8  3.5 - 5.1 mEq/L   Chloride 102  96 - 112 mEq/L   CO2 22  19 - 32 mEq/L   Glucose, Bld 196 (*) 70 - 99 mg/dL   BUN 24 (*) 6 - 23 mg/dL   Creatinine, Ser 6.44  0.50 - 1.35 mg/dL   Calcium 9.0  8.4 - 03.4 mg/dL   GFR calc non Af Amer >90  >90 mL/min   GFR calc Af Amer >90  >90 mL/min  PRO B NATRIURETIC PEPTIDE      Result Value Range   Pro B Natriuretic peptide (BNP) 102.1  0 - 125 pg/mL  CBC WITH DIFFERENTIAL      Result Value Range   WBC 13.4 (*) 4.0 - 10.5 K/uL   RBC 5.81  4.22 - 5.81 MIL/uL   Hemoglobin 15.4  13.0 -  17.0 g/dL   HCT 11.9  14.7 - 82.9 %   MCV 76.6 (*) 78.0 - 100.0 fL   MCH 26.5  26.0 - 34.0 pg   MCHC 34.6  30.0 - 36.0 g/dL   RDW  56.2  13.0 - 86.5 %   Platelets 201  150 - 400 K/uL   Neutrophils Relative % 82 (*) 43 - 77 %   Neutro Abs 10.9 (*) 1.7 - 7.7 K/uL   Lymphocytes Relative 11 (*) 12 - 46 %   Lymphs Abs 1.5  0.7 - 4.0 K/uL   Monocytes Relative 6  3 - 12 %   Monocytes Absolute 0.8  0.1 - 1.0 K/uL   Eosinophils Relative 1  0 - 5 %   Eosinophils Absolute 0.1  0.0 - 0.7 K/uL   Basophils Relative 0  0 - 1 %   Basophils Absolute 0.0  0.0 - 0.1 K/uL  PROTIME-INR      Result Value Range   Prothrombin Time 13.5  11.6 - 15.2 seconds   INR 1.05  0.00 - 1.49  APTT      Result Value Range   aPTT 28  24 - 37 seconds  POCT I-STAT TROPONIN I      Result Value Range   Troponin i, poc 0.02  0.00 - 0.08 ng/mL   Comment 3            Dg Chest 2 View  05/15/2013   *RADIOLOGY REPORT*  Clinical Data: Chest pain  CHEST - 2 VIEW  Comparison: 03/08/2004  Findings: Mild cardiomegaly.  Scattered atherosclerosis of the aorta.  Status post median sternotomy and CABG.  No consolidation, pleural effusion, or pneumothorax.  Mild multilevel degenerative change.  IMPRESSION: Cardiomegaly.  No focal consolidation.   Original Report Authenticated By: Jearld Lesch, M.D.     Date: 05/15/2013  Rate: 82  Rhythm: normal sinus rhythm and premature ventricular contractions (PVC)  QRS Axis: normal  Intervals: normal  ST/T Wave abnormalities: nonspecific T wave changes  Conduction Disutrbances:none  Narrative Interpretation: Frequent PVCs, old anteroseptal myocardial infarction, old inferior wall myocardial infarction, nonspecific T wave flattening. When compared with ECG of 05/31/2012, no significant changes are seen.  Old EKG Reviewed: unchanged   MDM  No diagnosis found. Chest pain in pattern consistent with unstable angina. Old records are reviewed and his last heart catheterization was in 2005 at which point he did have some stenosis and stents as well as it anastomoses. He is started on heparin and intravenous nitroglycerin and  will need to be admitted.  Troponin has come back normal. Case is discussed with Dr. Terressa Koyanagi, on call for cardiology, who agrees to admit the patient.  Dione Booze, MD 05/15/13 [phone]

## 2013-05-15 NOTE — Progress Notes (Signed)
To cath lab by bed, stable.

## 2013-05-15 NOTE — Progress Notes (Signed)
Patient: Robert [name] / Admit Date: 05/15/2013 / Date of Encounter: 05/15/2013, 6:44 AM   Subjective  Feels well without CP or SOB since being placed on NTG gtt. Reports several weeks of exertional CP, relieved with rest and NTG similar to prior angina, worsening last night.   Objective   Telemetry: NSR Physical Exam: Filed Vitals:   05/15/13 0600  BP: 142/73  Pulse: 73  Temp: 97.8 F (36.6 C)  Resp: 20   General: Well developed, well nourished WM in no acute distress. Head: Normocephalic, atraumatic, sclera non-icteric, no xanthomas, nares are without discharge. Neck: Negative for carotid bruits. JVD not elevated. Lungs: Clear bilaterally to auscultation without wheezes, rales, or rhonchi. Breathing is unlabored. Heart: RRR S1 S2 without murmurs, rubs, or gallops.  Abdomen: Soft, non-tender, non-distended with normoactive bowel sounds. No hepatomegaly. No rebound/guarding. No obvious abdominal masses. Msk:  Strength and tone appear normal for age. Extremities: No clubbing or cyanosis. No edema.  Distal pedal pulses are 2+ and equal bilaterally. Neuro: Alert and oriented X 3. Moves all extremities spontaneously. Psych:  Responds to questions appropriately with a normal affect.    Intake/Output Summary (Last 24 hours) at 05/15/13 0644 Last data filed at 05/15/13 0600  Gross per 24 hour  Intake   61.5 ml  Output      0 ml  Net   61.5 ml    Inpatient Medications:  . amitriptyline  25 mg Oral QHS  . [START ON 05/16/2013] aspirin EC  81 mg Oral Daily  . atenolol  25 mg Oral QHS  . clopidogrel  75 mg Oral Daily  . insulin aspart  0-5 Units Subcutaneous QHS  . insulin aspart  0-9 Units Subcutaneous TID WC  . lisinopril  10 mg Oral QHS  . multivitamin with minerals  1 tablet Oral q morning - 10a  . pantoprazole  40 mg Oral Daily  . [START ON 05/23/2013] predniSONE  10 mg Oral Q breakfast  . [START ON 05/20/2013] predniSONE  20 mg Oral Q breakfast  . [START ON 05/17/2013]  predniSONE  40 mg Oral Q breakfast  . predniSONE  60 mg Oral Q breakfast  . simvastatin  40 mg Oral q1800  . sulindac  200 mg Oral BID  . vitamin E  400 Units Oral QPM    Labs:  Recent Labs  05/15/13 0318  NA 136  K 3.8  CL 102  CO2 22  GLUCOSE 196*  BUN 24*  CREATININE 0.77  CALCIUM 9.0    Recent Labs  05/15/13 0344  WBC 13.4*  NEUTROABS 10.9*  HGB 15.4  HCT 44.5  MCV 76.6*  PLT 201    Recent Labs  05/15/13 0318  TROPONINI <0.30    Radiology/Studies:  Dg Chest 2 View  05/15/2013   *RADIOLOGY REPORT*  Clinical Data: Chest pain  CHEST - 2 VIEW  Comparison: 03/08/2004  Findings: Mild cardiomegaly.  Scattered atherosclerosis of the aorta.  Status post median sternotomy and CABG.  No consolidation, pleural effusion, or pneumothorax.  Mild multilevel degenerative change.  IMPRESSION: Cardiomegaly.  No focal consolidation.   Original Report Authenticated By: Andrew  DelGaizo, M.D.     Assessment and Plan  1. CP concerning for USA, with known history of CAD s/p remote CABG & stenting 2. Hyperlipidemia 3. Hypertension 4. Osteoarthritis of lower back, placed on predisone taper yesterday by ortho 5. Leukocytosis, may be related to prednisone taper 6. Hyperglycemia, A1C pending 7. Obesity BMI 33.4  Likely needs   cardiac cath today. Pt has been made NPO. Risks and benefits of cardiac catheterization have been discussed with the patient. These include bleeding, infection, kidney damage, stroke, heart attack, death. The patient understands these risks and is willing to proceed if Dr. Haiden Rawlinson agrees. Continue ASA, BB, statin, Plavix, heparin & NTG gtts. Consider P2Y12.  Signed, Dayna Dunn PA-C  Patient seen, examined. Available data reviewed. Agree with findings, assessment, and plan as outlined by Dayna Dunn, PA-C. Pt has been stable x several years after remote CABG and PCI procedures. Last cath 2008 was reviewed. He has 2 weeks of crescendo angina, typical symptoms of USAP.  Exam reveals alert, oriented male in NAD, lungs clear, heart RRR without murmur/gallop, no peripheral edema. Agree with plan as above. Reviewed risks, indication, alternatives to cath and PCI. He understands and agrees to proceed.  Lipa Knauff, M.D. 05/15/2013 9:09 AM   

## 2013-05-15 NOTE — Care Management Note (Signed)
    Page 1 of 1   05/15/2013     8:27:20 AM   CARE MANAGEMENT NOTE 05/15/2013  Patient:  Robert [name], Robert [name]   Account Number:  [id number]  Date Initiated:  05/15/2013  Documentation initiated by:  Junius Creamer  Subjective/Objective Assessment:   adm w angina     Action/Plan:   lives w wife, pcp dr Oliver Barre   Anticipated DC Date:     Anticipated DC Plan:        DC Planning Services  CM consult      Choice offered to / List presented to:             Status of service:   Medicare Important Message given?   (If response is "NO", the following Medicare IM given date fields will be blank) Date Medicare IM given:   Date Additional Medicare IM given:    Discharge Disposition:    Per UR Regulation:  Reviewed for med. necessity/level of care/duration of stay  If discussed at Long Length of Stay Meetings, dates discussed:    Comments:

## 2013-05-16 ENCOUNTER — Encounter (HOSPITAL_COMMUNITY): Payer: Self-pay | Admitting: Nurse Practitioner

## 2013-05-16 DIAGNOSIS — G4733 Obstructive sleep apnea (adult) (pediatric): Secondary | ICD-10-CM

## 2013-05-16 DIAGNOSIS — I200 Unstable angina: Secondary | ICD-10-CM

## 2013-05-16 LAB — GLUCOSE, CAPILLARY
Glucose-Capillary: 147 mg/dL — ABNORMAL HIGH (ref 70–99)
Glucose-Capillary: 150 mg/dL — ABNORMAL HIGH (ref 70–99)

## 2013-05-16 LAB — CBC
Hemoglobin: 14.1 g/dL (ref 13.0–17.0)
MCH: 26.1 pg (ref 26.0–34.0)
MCHC: 33.7 g/dL (ref 30.0–36.0)
MCV: 77.4 fL — ABNORMAL LOW (ref 78.0–100.0)
RBC: 5.4 MIL/uL (ref 4.22–5.81)

## 2013-05-16 LAB — BASIC METABOLIC PANEL
CO2: 25 mEq/L (ref 19–32)
Calcium: 8.9 mg/dL (ref 8.4–10.5)
Creatinine, Ser: 0.73 mg/dL (ref 0.50–1.35)
Glucose, Bld: 127 mg/dL — ABNORMAL HIGH (ref 70–99)

## 2013-05-16 MED ORDER — ISOSORBIDE MONONITRATE ER 30 MG PO TB24: 30.0000 mg | ORAL_TABLET | Freq: Every day | ORAL | Status: DC

## 2013-05-16 MED ORDER — ISOSORBIDE MONONITRATE ER 30 MG PO TB24
30.0000 mg | ORAL_TABLET | Freq: Every day | ORAL | Status: DC
  Administered 2013-05-16: 30 mg via ORAL
  Filled 2013-05-16: qty 1

## 2013-05-16 MED ORDER — ATENOLOL 50 MG PO TABS: 50.0000 mg | ORAL_TABLET | Freq: Every day | ORAL | Status: DC

## 2013-05-16 MED ORDER — NITROGLYCERIN 0.4 MG SL SUBL: 0.4000 mg | SUBLINGUAL_TABLET | SUBLINGUAL | Status: DC | PRN

## 2013-05-16 NOTE — Progress Notes (Signed)
CARDIAC REHAB PHASE I   PRE:  Rate/Rhythm: 66SR  BP:  Supine: 113/80  Sitting:   Standing:    SaO2:   MODE:  Ambulation: 1000 ft   POST:  Rate/Rhythm: 82SR  BP:  Supine: 125/69  Sitting:   Standing:    SaO2:  [id number] Pt walked 1000 ft on RA with steady gait. Tolerated well. Denied CP. Education completed re ex and diet. Discussed CRP 2. Pt attended after CABG and is not sure he wants to attend again. Gave permission to refer but wants to talk with wife. Will refer to [place].   Luetta Nutting, RN BSN  05/16/2013 9:30 AM

## 2013-05-16 NOTE — Discharge Summary (Signed)
Patient ID: Robert [name],  MRN: [id number], DOB/AGE: March 24, 1946 67 y.o.  Admit date: 05/15/2013 Discharge date: 05/16/2013  Primary Care Provider: Oliver Barre Primary Cardiologist: Judie Petit. Excell Seltzer, MD   Discharge Diagnoses Principal Problem:   Unstable angina  **s/p cath this admission revealing stable anatomy with 2/3 patent grafts -->Med Rx (Imdur added).  Active Problems:   Coronary atherosclerosis of autologous vein bypass graft   HYPERTENSION   Morbid obesity   HYPERLIPIDEMIA   Impaired glucose tolerance   OSA (obstructive sleep apnea)  Allergies Allergies  Allergen Reactions  . Metoprolol     REACTION: severe bradycardia   Procedures  Cardiac Catheterization 8.27.2014  Procedural Findings:  Hemodynamics: AO:  129/75   mmHg LV:  131/8    mmHg LVEDP: 20  mmHg  Coronary angiography: Coronary dominance: Right     Left Main:  Normal in size with no significant disease.  Left Anterior Descending (LAD): Chronically Occluded at the ostium.  Circumflex (LCx):  Normal in size and nondominant. The vessel is mildly calcified. Stent is noted proximally which is patent with minor 10% instent restenosis. There is a 40% discrete lesion in the midsegment. Otherwise no evidence of obstructive disease.  1st obtuse marginal:  Small in size with minor irregularities.  2nd obtuse marginal:  Large in size with no significant disease.  3rd obtuse marginal:  Small in size with no significant disease.    Ramus Intermedius:  Medium in size with minor irregularities.  Right Coronary Artery: Normal in size and dominant. There is an 80-90% disease proximally followed by total occlusion in the midsegment.  LIMA to LAD: widely patent with no significant disease. The LAD fills the diagonals retrograde.  SVG to RCA, patent with a stent noted at the ostium. The stent has stable 20-30% instent restenosis which is unchanged.  SVG to diagonal: Chronically occluded at the ostium.  Left  ventriculography: Left ventricular systolic function is normal , LVEF is estimated at 60 %, there is no significant mitral regurgitation   Final Conclusions:   1. Significant three-vessel coronary artery disease with patent LIMA to LAD and SVG to RCA. Chronically occluded SVG to diagonal. Patent stent in the native left circumflex. No significant change in anatomy since most recent cardiac catheterization. 2. Normal LV systolic function. _____________   History of Present Illness  67 y/o male with h/o CAD s/p CABG in 1989 with subsequent stenting of the native LCX in 1995 and VG->RCA in 2001.  He was in his USOH until approximately 3 wks prior to admission when he began to experience exertional midsternal chest discomfort without associated symptoms.  Discomfort was typically nitrate responsive.  On the evening prior to admission, chest pain worsened, prompting him to present to the [facility] ED.  There, he was treated with heparin, asa, and ntg with relief of pain.  Initial troponin was normal and ECG was non-acute.  He was admitted for further evalation.  Hospital Course  Patient ruled out for MI.  Given concern for ischemia in setting of progressive symptoms, cardiac catheterization was undertaken on 8/27 revealing stable anatomy and 2/3 patent grafts.  Previously placed stents in the left circumflex and VG->RCA were patent.  Continued medical therapy was recommended.  Post-catheterization, he continued to report chest pain requiring titration of IV nitroglycerin.  This morning, he was placed on oral long-acting nitrate therapy and IV ntg was weaned off.  He has since been ambulating w/o difficulty.  As a result, he will be discharged home today  in good condition with f/u arranged in 2-3 wks.  Discharge Vitals Blood pressure 126/79, pulse 66, temperature 98.3 F (36.8 C), temperature source Oral, resp. rate 16, height 6' (1.829 m), weight 247 lb 12.8 oz (112.4 kg), SpO2 92.00%.  Filed Weights    05/15/13 0412 05/15/13 0533 05/16/13 0000  Weight: 240 lb (108.863 kg) 246 lb 0.5 oz (111.6 kg) 247 lb 12.8 oz (112.4 kg)   Labs  CBC  Recent Labs  05/15/13 0344 05/16/13 0733  WBC 13.4* 17.0*  NEUTROABS 10.9*  --   HGB 15.4 14.1  HCT 44.5 41.8  MCV 76.6* 77.4*  PLT 201 200   Basic Metabolic Panel  Recent Labs  05/15/13 0318 05/16/13 0733  NA 136 138  K 3.8 4.0  CL 102 104  CO2 22 25  GLUCOSE 196* 127*  BUN 24* 21  CREATININE 0.77 0.73  CALCIUM 9.0 8.9   Cardiac Enzymes  Recent Labs  05/15/13 0318 05/15/13 0810  TROPONINI <0.30 <0.30   D-Dimer  Recent Labs  05/15/13 1441  DDIMER <0.27   Hemoglobin A1C  Recent Labs  05/15/13 0810  HGBA1C 5.8*   Fasting Lipid Panel  Recent Labs  05/15/13 0810  CHOL 154  HDL 48  LDLCALC 91  TRIG 76  CHOLHDL 3.2   Thyroid Function Tests  Recent Labs  05/15/13 0810  TSH 0.414   Disposition  Pt is being discharged home today in good condition.  Follow-up Plans & Appointments  Follow-up Information   Follow up with Tonny Bollman, MD On 06/18/2013. (9:00)    Specialty:  Cardiology   Contact information:   [address] [place] [address] [phone]       Follow up with Oliver Barre, MD. (as scheduled)    Specialties:  Internal Medicine, Radiology   Contact information:   [address] [place] [address] [phone]       Follow up with Tereso Newcomer, PA-C On 06/04/2013. (9:50 AM)    Specialty:  Physician Assistant   Contact information:   [address] [place] [address] [phone]     Discharge Medications    Medication List    STOP taking these medications       vitamin E 400 UNIT capsule      TAKE these medications       amitriptyline 25 MG tablet  Commonly known as:  ELAVIL  Take 25 mg by mouth at bedtime.     aspirin 81 MG tablet  Take 81 mg by mouth daily.     atenolol 50 MG tablet  Commonly known as:  TENORMIN    Take 1 tablet (50 mg total) by mouth at bedtime.     clopidogrel 75 MG tablet  Commonly known as:  PLAVIX  Take 75 mg by mouth daily.     isosorbide mononitrate 30 MG 24 hr tablet  Commonly known as:  IMDUR  Take 1 tablet (30 mg total) by mouth daily.     lisinopril 10 MG tablet  Commonly known as:  PRINIVIL,ZESTRIL  Take 10 mg by mouth at bedtime.     multivitamin with minerals Tabs tablet  Take 1 tablet by mouth every morning.     nitroGLYCERIN 0.4 MG SL tablet  Commonly known as:  NITROSTAT  Place 1 tablet (0.4 mg total) under the tongue every 5 (five) minutes as needed for chest pain.     omeprazole 20 MG capsule  Commonly known as:  PRILOSEC  Take 20 mg by mouth daily.     predniSONE 10 MG tablet  Commonly known as:  STERAPRED UNI-PAK  Take 10-60 mg by mouth daily. 12 day taper dose. START 8.26.14 END 9.7.14     PROAIR HFA 108 (90 BASE) MCG/ACT inhaler  Generic drug:  albuterol  Inhale 2 puffs into the lungs every 6 (six) hours as needed.     simvastatin 80 MG tablet  Commonly known as:  ZOCOR  Take 40 mg by mouth at bedtime.     sulindac 200 MG tablet  Commonly known as:  CLINORIL  Take 200 mg by mouth 2 (two) times daily.     temazepam 30 MG capsule  Commonly known as:  RESTORIL  Take 30 mg by mouth at bedtime as needed for sleep.      Outstanding Labs/Studies  None  Duration of Discharge Encounter   Greater than 30 minutes including physician time.  Signed, Nicolasa Ducking NP 05/16/2013, 2:19 PM

## 2013-05-16 NOTE — Progress Notes (Signed)
Patient Name: Robert [name] Date of Encounter: 05/16/2013   Principal Problem:   Unstable angina Active Problems:   Coronary atherosclerosis of autologous vein bypass graft   HYPERTENSION   Morbid obesity   HYPERLIPIDEMIA   Impaired glucose tolerance   OSA (obstructive sleep apnea)    SUBJECTIVE  Had chest pain last night resulting in titration of IV ntg.  Currently pain free.  Has not been out of bed yet.  CURRENT MEDS . amitriptyline  25 mg Oral QHS  . aspirin EC  81 mg Oral Daily  . atenolol  50 mg Oral QHS  . clopidogrel  75 mg Oral Daily  . insulin aspart  0-5 Units Subcutaneous QHS  . insulin aspart  0-9 Units Subcutaneous TID WC  . lisinopril  10 mg Oral QHS  . multivitamin with minerals  1 tablet Oral q morning - 10a  . pantoprazole  40 mg Oral Daily  . [START ON 05/23/2013] predniSONE  10 mg Oral Q breakfast  . [START ON 05/20/2013] predniSONE  20 mg Oral Q breakfast  . [START ON 05/17/2013] predniSONE  40 mg Oral Q breakfast  . predniSONE  60 mg Oral Q breakfast  . simvastatin  40 mg Oral q1800  . sulindac  200 mg Oral BID  . vitamin E  400 Units Oral QPM    OBJECTIVE  Filed Vitals:   05/16/13 0000 05/16/13 0200 05/16/13 0400 05/16/13 0600  BP: 92/43 113/68 103/44 121/71  Pulse: 79 72 76 64  Temp: 97.7 F (36.5 C)   98.1 F (36.7 C)  TempSrc: Oral     Resp: 20 20 20 18   Height:      Weight: 247 lb 12.8 oz (112.4 kg)     SpO2: 94% 97% 96% 96%    Intake/Output Summary (Last 24 hours) at 05/16/13 0703 Last data filed at 05/16/13 1610  Gross per 24 hour  Intake   1541 ml  Output   2100 ml  Net   -559 ml   Filed Weights   05/15/13 0412 05/15/13 0533 05/16/13 0000  Weight: 240 lb (108.863 kg) 246 lb 0.5 oz (111.6 kg) 247 lb 12.8 oz (112.4 kg)    PHYSICAL EXAM  General: Pleasant, NAD. Neuro: Alert and oriented X 3. Moves all extremities spontaneously. Psych: Normal affect. HEENT:  Normal  Neck: Supple without bruits or JVD. Lungs:  Resp  regular and unlabored, CTA. Heart: RRR no s3, s4, or murmurs. Abdomen: Soft, non-tender, non-distended, BS + x 4.  Extremities: No clubbing, cyanosis or edema. DP/PT/Radials 2+ and equal bilaterally.  R groin w/o bleeding/bruit/hematoma.  Accessory Clinical Findings  CBC  Recent Labs  05/15/13 0344  WBC 13.4*  NEUTROABS 10.9*  HGB 15.4  HCT 44.5  MCV 76.6*  PLT 201   Basic Metabolic Panel  Recent Labs  05/15/13 0318  NA 136  K 3.8  CL 102  CO2 22  GLUCOSE 196*  BUN 24*  CREATININE 0.77  CALCIUM 9.0   Cardiac Enzymes  Recent Labs  05/15/13 0318 05/15/13 0810  TROPONINI <0.30 <0.30   D-Dimer  Recent Labs  05/15/13 1441  DDIMER <0.27   Hemoglobin A1C  Recent Labs  05/15/13 0810  HGBA1C 5.8*   Fasting Lipid Panel  Recent Labs  05/15/13 0810  CHOL 154  HDL 48  LDLCALC 91  TRIG 76  CHOLHDL 3.2   Thyroid Function Tests  Recent Labs  05/15/13 0810  TSH 0.414   Lab Results  Component Value Date   DDIMER <0.27 05/15/2013   TELE  rsr  ECG  Rsr, 66, prev inf q's now improved?  Radiology/Studies  Dg Chest 2 View  05/15/2013   *RADIOLOGY REPORT*  Clinical Data: Chest pain  CHEST - 2 VIEW  Comparison: 03/08/2004  Findings: Mild cardiomegaly.  Scattered atherosclerosis of the aorta.  Status post median sternotomy and CABG.  No consolidation, pleural effusion, or pneumothorax.  Mild multilevel degenerative change.  IMPRESSION: Cardiomegaly.  No focal consolidation.   Original Report Authenticated By: Jearld Lesch, M.D.    ASSESSMENT AND PLAN  1.  USA/CAD:  S/p cath revealing severe native 3vd with 2/3 patent grafts - overall stable compared to last cath.  D dimer nl.  He had additional c/p last night requiring titration of IV ntg.  Home atenolol dose titrated post-cath.  Will wean IV ntg and add imdur.  His BP is somewhat soft.  Ambulate today and assess symptoms later.  Possible d/c this afternoon.  Would likely benefit from cardiac  rehab.  Cont asa, bb, statin.  He is on chronic plavix therapy w/o clear indication.  Last stent placed in 07/2000 ([facility]).  Consider discontinuation.  2.  HTN:  Stable.  3.  HL:  LDL 91.  Consider switch to lipitor 80 if not cost prohibitive.  4.  Impaired fasting glucose:  A1c 5.8.  5.  LBP:  On prednisone taper Rx by ortho as outpt.  Likely explains leukocytosis.  Signed, Nicolasa Ducking NP  Patient seen, examined. Available data reviewed. Agree with findings, assessment, and plan as outlined by Ward Givens, NP. Exam reveals alert, oriented male in NAD. Lungs clear and heart RRR. No edema. Groin site is clear. Cath reviewed and stable anatomy noted. Agree with med changes as above with increase of atenolol and addition of low-dose imdur. Home today. Has f/u scheduled in one month with me.  Tonny Bollman, M.D. 05/16/2013 10:36 AM

## 2013-05-17 ENCOUNTER — Emergency Department (HOSPITAL_COMMUNITY)
Admission: EM | Admit: 2013-05-17 | Discharge: 2013-05-17 | Disposition: A | Discharge status: Home / Self Care | Payer: Medicare Other | Source: Home / Self Care

## 2013-05-17 ENCOUNTER — Encounter (HOSPITAL_COMMUNITY): Payer: Self-pay | Admitting: Emergency Medicine

## 2013-05-17 DIAGNOSIS — N390 Urinary tract infection, site not specified: Secondary | ICD-10-CM

## 2013-05-17 LAB — POCT URINALYSIS DIP (DEVICE)
Bilirubin Urine: NEGATIVE
Glucose, UA: NEGATIVE mg/dL
Nitrite: POSITIVE — AB

## 2013-05-17 MED ORDER — CIPROFLOXACIN HCL 500 MG PO TABS: 500.0000 mg | ORAL_TABLET | Freq: Two times a day (BID) | ORAL | Status: DC

## 2013-05-17 MED ORDER — ACETAMINOPHEN 325 MG PO TABS
ORAL_TABLET | ORAL | Status: AC
  Filled 2013-05-17: qty 3

## 2013-05-17 NOTE — ED Notes (Signed)
Pt given 975 mg of tylenol for fever.  Mw,cma 

## 2013-05-17 NOTE — ED Provider Notes (Signed)
CSN: [id number]     Arrival date & time 05/17/13  1752 History   First MD Initiated Contact with Patient 05/17/13 1925     Chief Complaint  Patient presents with  . Fever    recent discharge from cath lab. uti symptoms last night into today.    (Consider location/radiation/quality/duration/timing/severity/associated sxs/prior Treatment) HPI Patient is a 67 yo M [facility] with fever, fatigue and vague urinary complaints x1 day. He was released from the hospital yesterday s/p heart cath. After he got home, he felt dizzy and more fatigued. He reports some dysuria. Did not have a catheter while in the hospital. More frequent urination today, and feels like there is a little left in his bladder. Temp at home Tmax 102, currently 101.5. Denies hematuria. No current back pain, but had pain last week (treated with prednisone.) He has had UTI in the past a few years ago.  Past Medical History  Diagnosis Date  . ALLERGIC RHINITIS 08/20/2007  . BENIGN PROSTATIC HYPERTROPHY 08/20/2007  . CAD (coronary artery disease)     a. s/p CABG x 3 in 1989;  b. MI in 1995 with BMS to VG->RCA;  c. 07/2000 PCI/BMS to native LCX;  d. Known CTO of VG->Diag;  e. Mult caths w/ stable anatomy;  f. 04/2013 Cath: LM nl, LAD 100, LCX 10%isr, 62m, OM1/2/3 min irregs, RI min irregs, RCA 80-90p/162m, VG->RCA 20-30isr, LIMA->LAD ok, EF 60%-->Med Rx.  . ELBOW PAIN, LEFT 04/15/2008  . ERECTILE DYSFUNCTION 08/20/2007  . HYPERLIPIDEMIA TYPE I / IV 09/03/2008  . HYPERTENSION 08/20/2007  . INSOMNIA-SLEEP DISORDER-UNSPEC 03/17/2008  . PREMATURE VENTRICULAR CONTRACTIONS 08/20/2007  . SCROTAL MASS 10/09/2008  . URINARY INCONTINENCE, MALE 10/09/2008  . UTI 04/24/2009    "just this once" (05/15/2013)  . History of meningitis   . Microcytosis   . Asthma     "very slight" (05/15/2013)  . OSA on CPAP   . Arthritis     "back; knees; shoulders" (05/15/2013)  . Chronic lower back pain    Past Surgical History  Procedure  Laterality Date  . Transurethral resection of prostate  10/02/2009    Gyrus transurethral resection   . Cystoscopy  08/07/2009    and right hydrocelectomy  . Colonoscopy w/ biopsies and polypectomy  11/20/2003    adenomatous polyp, internal and external hemorrhoids  . Foot tendon surgery Left ~ 2011    "donor tendon" (05/15/2013)  . Shoulder open rotator cuff repair Right 2009    Gioffre  . Coronary artery bypass graft  1989    "X3" (05/15/2013)  . Coronary angioplasty  1995  . Coronary angioplasty with stent placement  1998; 2000; 2001  . Inguinal hernia repair Bilateral [phone]    "2 on one side; 1 on the other" (05/15/2013)  . Ankle fracture surgery Right 1980's  . Tonsillectomy and adenoidectomy  ~ 1960  . Surgery scrotal / testicular Right 2012    "removed the sac" (05/15/2013)   Family History  Problem Relation Age of Onset  . Heart disease Brother   . Esophageal cancer Brother   . Coronary artery disease Father   . Coronary artery disease Mother     pacemaker   History  Substance Use Topics  . Smoking status: Former Smoker -- 1.00 packs/day for 45 years    Types: Cigarettes    Quit date: 04/23/1994  . Smokeless tobacco: Never Used  . Alcohol Use: Yes     Comment: 05/15/2013 "I've been quit drinking  since 1989"    Review of Systems  Constitutional: Positive for fever, chills and fatigue.  HENT: Negative for congestion and neck stiffness.   Respiratory: Negative for chest tightness and shortness of breath.   Cardiovascular: Negative for chest pain.  Gastrointestinal: Negative for abdominal pain and constipation.  Genitourinary: Positive for dysuria, urgency and difficulty urinating. Negative for hematuria, flank pain and penile pain.  Musculoskeletal: Positive for back pain.  Skin: Negative for rash.  Neurological: Negative for headaches.  All other systems reviewed and are negative.    Allergies  Metoprolol  Home Medications   Current Outpatient Rx  Name   Route  Sig  Dispense  Refill  . albuterol (PROAIR HFA) 108 (90 BASE) MCG/ACT inhaler   Inhalation   Inhale 2 puffs into the lungs every 6 (six) hours as needed.           Marland Kitchen amitriptyline (ELAVIL) 25 MG tablet   Oral   Take 25 mg by mouth at bedtime.          Marland Kitchen aspirin 81 MG tablet   Oral   Take 81 mg by mouth daily.           Marland Kitchen atenolol (TENORMIN) 50 MG tablet   Oral   Take 1 tablet (50 mg total) by mouth at bedtime.   30 tablet   6   . clopidogrel (PLAVIX) 75 MG tablet   Oral   Take 75 mg by mouth daily.           . isosorbide mononitrate (IMDUR) 30 MG 24 hr tablet   Oral   Take 1 tablet (30 mg total) by mouth daily.   30 tablet   6   . lisinopril (PRINIVIL,ZESTRIL) 10 MG tablet   Oral   Take 10 mg by mouth at bedtime.         . Multiple Vitamin (MULTIVITAMIN WITH MINERALS) TABS tablet   Oral   Take 1 tablet by mouth every morning.         Marland Kitchen omeprazole (PRILOSEC) 20 MG capsule   Oral   Take 20 mg by mouth daily.           . predniSONE (STERAPRED UNI-PAK) 10 MG tablet   Oral   Take 10-60 mg by mouth daily. 12 day taper dose. START 8.26.14 END 9.7.14         . simvastatin (ZOCOR) 80 MG tablet   Oral   Take 40 mg by mouth at bedtime.          . sulindac (CLINORIL) 200 MG tablet   Oral   Take 200 mg by mouth 2 (two) times daily.           . temazepam (RESTORIL) 30 MG capsule   Oral   Take 30 mg by mouth at bedtime as needed for sleep.          . ciprofloxacin (CIPRO) 500 MG tablet   Oral   Take 1 tablet (500 mg total) by mouth 2 (two) times daily.   20 tablet   0   . nitroGLYCERIN (NITROSTAT) 0.4 MG SL tablet   Sublingual   Place 1 tablet (0.4 mg total) under the tongue every 5 (five) minutes as needed for chest pain.   25 tablet   3    BP 121/75  Pulse 84  Temp(Src) 101.5 F (38.6 C) (Oral)  Resp 20  SpO2 95% Physical Exam  Constitutional: He is oriented to person, place, and time.  He appears well-developed and  well-nourished. No distress.  HENT:  Head: Normocephalic and atraumatic.  Neck: Normal range of motion.  Cardiovascular: Normal rate and regular rhythm.   Pulmonary/Chest: Effort normal and breath sounds normal.  Abdominal: Soft. He exhibits no distension. There is no tenderness. There is no guarding.  Musculoskeletal: Normal range of motion. He exhibits no edema.  Lymphadenopathy:    He has no cervical adenopathy.  Neurological: He is alert and oriented to person, place, and time.  Skin: Skin is warm and dry. No rash noted.    ED Course  Procedures (including critical care time) Labs Review Labs Reviewed  POCT URINALYSIS DIP (DEVICE) - Abnormal; Notable for the following:    Hgb urine dipstick MODERATE (*)    Protein, ur 100 (*)    Nitrite POSITIVE (*)    Leukocytes, UA SMALL (*)    All other components within normal limits  URINE CULTURE   Imaging Review No results found.  MDM   1. UTI (urinary tract infection)    67 yo M with fever and dysuria, UA suggestive of urinary tract infection - Will treat with Cipro (indicated for pyelonephritis) and await urine cultures - Given very specific reasons to return including worsening dysuria, back pain or fever does not resolve - If he has recurrent UTI, may be worth seeing his urologist again for prostate evaluation - F/u with Dr. Jonny Ruiz early next week - Patient agrees with plan    Hilarie Fredrickson, MD 05/17/13 1949

## 2013-05-17 NOTE — ED Notes (Signed)
C/o fever and uti symptoms. uti symptoms started yesterday evening. Fever/chills ? Since this a.m. Pt has not taken any meds for symptoms.

## 2013-05-18 ENCOUNTER — Ambulatory Visit: Payer: Medicare Other | Admitting: Family Medicine

## 2013-05-18 NOTE — ED Provider Notes (Signed)
Medical screening examination/treatment/procedure(s) were performed by a resident physician or non-physician practitioner and as the supervising physician I was immediately available for consultation/collaboration.  Evan Corey, MD   Evan S Corey, MD 05/18/13 0759 

## 2013-05-20 LAB — URINE CULTURE: Colony Count: >=100000

## 2013-05-21 NOTE — ED Notes (Signed)
Urine culture: >100,000 colonies E. Coli.  Pt. adequately treated with Cipro. Robert [name] 05/21/2013

## 2013-06-04 ENCOUNTER — Encounter: Payer: Medicare Other | Admitting: Physician Assistant

## 2013-06-18 ENCOUNTER — Ambulatory Visit (INDEPENDENT_AMBULATORY_CARE_PROVIDER_SITE_OTHER): Payer: Medicare Other | Admitting: Cardiovascular Disease

## 2013-06-18 ENCOUNTER — Encounter: Payer: Self-pay | Admitting: Cardiovascular Disease

## 2013-06-18 VITALS — BP 149/88 | HR 67 | Ht 72.0 in | Wt 241.1 lb

## 2013-06-18 DIAGNOSIS — E785 Hyperlipidemia, unspecified: Secondary | ICD-10-CM

## 2013-06-18 DIAGNOSIS — I25810 Atherosclerosis of coronary artery bypass graft(s) without angina pectoris: Secondary | ICD-10-CM

## 2013-06-18 MED ORDER — OMEPRAZOLE 20 MG PO CPDR: 20.0000 mg | DELAYED_RELEASE_CAPSULE | Freq: Every day | ORAL | Status: DC

## 2013-06-18 MED ORDER — ATENOLOL 25 MG PO TABS: 25.0000 mg | ORAL_TABLET | Freq: Every day | ORAL | Status: DC

## 2013-06-18 NOTE — Progress Notes (Signed)
HPI:  67 year old gentleman presents for followup evaluation. The patient has CAD status post remote CABG in 1989. He has undergone multiple PCI procedures. He was recently hospitalized with symptoms of unstable angina. He underwent cardiac catheterization demonstrating stable coronary anatomy. He had continued patency of the LIMA to LAD and saphenous vein graft to RCA. His native left circumflex had a patent stent. LV function was normal with an ejection fraction estimated at 60%. Lipids from 05/15/2013 showed a cholesterol of 154, triglycerides 76, HDL 48, and LDL 91.  The patient is doing well since his hospitalization. Isosorbide was added and he is having no more anginal symptoms. His atenolol was increased from 25-50 mg and he has felt more sluggish with that. He would like to go back to the 25 mg dose. He denies recurrence of chest pain or pressure, dyspnea, edema, or palpitations.  Outpatient Encounter Prescriptions as of 06/18/2013  Medication Sig Dispense Refill  . albuterol (PROAIR HFA) 108 (90 BASE) MCG/ACT inhaler Inhale 2 puffs into the lungs every 6 (six) hours as needed.        Marland Kitchen amitriptyline (ELAVIL) 25 MG tablet Take 25 mg by mouth at bedtime.       Marland Kitchen aspirin 81 MG tablet Take 81 mg by mouth daily.        Marland Kitchen atenolol (TENORMIN) 50 MG tablet Take 1 tablet (50 mg total) by mouth at bedtime.  30 tablet  6  . clopidogrel (PLAVIX) 75 MG tablet Take 75 mg by mouth daily.        . isosorbide mononitrate (IMDUR) 30 MG 24 hr tablet Take 1 tablet (30 mg total) by mouth daily.  30 tablet  6  . lisinopril (PRINIVIL,ZESTRIL) 10 MG tablet Take 10 mg by mouth at bedtime.      . Multiple Vitamin (MULTIVITAMIN WITH MINERALS) TABS tablet Take 1 tablet by mouth every morning.      . nitroGLYCERIN (NITROSTAT) 0.4 MG SL tablet Place 1 tablet (0.4 mg total) under the tongue every 5 (five) minutes as needed for chest pain.  25 tablet  3  . omeprazole (PRILOSEC) 20 MG capsule Take 20 mg by mouth daily.         . predniSONE (STERAPRED UNI-PAK) 10 MG tablet Take 10-60 mg by mouth daily. 12 day taper dose. START 8.26.14 END 9.7.14      . simvastatin (ZOCOR) 80 MG tablet Take 40 mg by mouth at bedtime.       . sulindac (CLINORIL) 200 MG tablet Take 200 mg by mouth 2 (two) times daily.        . temazepam (RESTORIL) 30 MG capsule Take 30 mg by mouth at bedtime as needed for sleep.       . [DISCONTINUED] ciprofloxacin (CIPRO) 500 MG tablet Take 1 tablet (500 mg total) by mouth 2 (two) times daily.  20 tablet  0   No facility-administered encounter medications on file as of 06/18/2013.    Allergies  Allergen Reactions  . Metoprolol     REACTION: severe bradycardia    Past Medical History  Diagnosis Date  . ALLERGIC RHINITIS 08/20/2007  . BENIGN PROSTATIC HYPERTROPHY 08/20/2007  . CAD (coronary artery disease)     a. s/p CABG x 3 in 1989;  b. MI in 1995 with BMS to VG->RCA;  c. 07/2000 PCI/BMS to native LCX;  d. Known CTO of VG->Diag;  e. Mult caths w/ stable anatomy;  f. 04/2013 Cath: LM nl, LAD 100, LCX 10%isr, 58m,  OM1/2/3 min irregs, RI min irregs, RCA 80-90p/163m, VG->RCA 20-30isr, LIMA->LAD ok, EF 60%-->Med Rx.  . ELBOW PAIN, LEFT 04/15/2008  . ERECTILE DYSFUNCTION 08/20/2007  . HYPERLIPIDEMIA TYPE I / IV 09/03/2008  . HYPERTENSION 08/20/2007  . INSOMNIA-SLEEP DISORDER-UNSPEC 03/17/2008  . PREMATURE VENTRICULAR CONTRACTIONS 08/20/2007  . SCROTAL MASS 10/09/2008  . URINARY INCONTINENCE, MALE 10/09/2008  . UTI 04/24/2009    "just this once" (05/15/2013)  . History of meningitis   . Microcytosis   . Asthma     "very slight" (05/15/2013)  . OSA on CPAP   . Arthritis     "back; knees; shoulders" (05/15/2013)  . Chronic lower back pain     ROS: Negative except as per HPI  BP 149/88  Pulse 67  Ht 6' (1.829 m)  Wt 109.362 kg (241 lb 1.6 oz)  BMI 32.69 kg/m2  PHYSICAL EXAM: Pt is alert and oriented, NAD HEENT: normal Neck: JVP - normal, carotids 2+= without bruits Lungs: CTA bilaterally CV:  RRR without murmur or gallop Abd: soft, NT, Positive BS, no hepatomegaly Ext: no C/C/E, distal pulses intact and equal Skin: warm/dry no rash  EKG:  Normal sinus rhythm 68 beats per minute, age indeterminate inferior infarct.  ASSESSMENT AND PLAN: 1. Coronary artery disease, native vessel with history of remote bypass surgery 25 years ago and PCI. Recent cardiac catheterization reviewed with stable coronary anatomy. Will decrease his atenolol to 25 mg daily. Otherwise he will continue on his current medical regimen without changes. I will see him back in 6 months.  2. Hyperlipidemia. The patient is taking simvastatin 40 mg daily. His lipids were reviewed and they are at goal.  3. Hypertension. Office blood pressure is elevated, but home readings have been good. He reports an average blood pressure of 130/85.  Tonny Bollman 06/18/2013 9:42 AM

## 2013-06-18 NOTE — Patient Instructions (Addendum)
Your physician has recommended you make the following change in your medication: DECREASE Atenolol to 25mg  take one by mouth daily  Your physician wants you to follow-up in: 6 MONTHS with Dr Excell Seltzer.  You will receive a reminder letter in the mail two months in advance. If you don't receive a letter, please call our office to schedule the follow-up appointment.

## 2013-08-20 ENCOUNTER — Telehealth: Payer: Self-pay | Admitting: Cardiovascular Disease

## 2013-08-20 NOTE — Telephone Encounter (Signed)
The pt is scheduled for HEMI-LUMBAR LAMINECTOMY L4 - L5 ON THE RIGHT WITH REMOVAL OF SYNOVIAL CYST LEVEL 1 on 09/18/13 with Dr Darrelyn Hillock.  The pt needs clearance from a cardiac stand point to proceed with surgery.  The pt also takes ASA and plavix and needs instructions on holding these medications prior to surgery.  I will forward this message to Dr Excell Seltzer to review.

## 2013-08-20 NOTE — Telephone Encounter (Signed)
The patient is at low risk of holding aspirin and Plavix prior to lumbar laminectomy. He has undergone recent cardiac catheterization demonstrating stable coronary anatomy. He should be able to proceed at low cardiac risk.

## 2013-08-20 NOTE — Telephone Encounter (Signed)
Per Dr Excell Seltzer the pt can hold plavix 5 days prior to surgery. I left a message on the pt's voicemail with this information.  I will fax this phone note to Attn: Imelda Pillow, per the pt's request.

## 2013-08-20 NOTE — Telephone Encounter (Signed)
New Problem  Pt calling for clearence to stop taking Palvix for back surgery///  Please fax the clearance to   [facility] w/ Dr. Gregary Cromer  Fax # [phone] Attn: Imelda Pillow

## 2013-09-06 ENCOUNTER — Encounter (HOSPITAL_COMMUNITY): Payer: Self-pay | Admitting: Pharmacy Technician

## 2013-09-09 ENCOUNTER — Other Ambulatory Visit (HOSPITAL_COMMUNITY): Payer: Self-pay | Admitting: Orthopedic Surgery

## 2013-09-09 NOTE — Progress Notes (Addendum)
ekg 06-18-2013 epic Rest lexican Celine Ahr 05-16-12 epic Chest 2 view xray 05-15-13 epic lov dr cooper 06-18-13 epic Cardiac clearance note 08-20-13 faxed by dr Darrelyn Hillock and placed on pt chart and in epic

## 2013-09-09 NOTE — Patient Instructions (Addendum)
20 Robert [name]  09/09/2013   Your procedure is scheduled on: Wednesday December 31st  Report to [facility] at 530 AM.  Call this number if you have problems the morning of surgery [phone]   Remember: please bring your inhaler on the day of surgery, please bring CPAP mask and tubing on day of surgery   Do not eat food or drink liquids :After Midnight.    Take these medicines the morning of surgery with A SIP OF WATER: albuterol, imdur, prilosec                                SEE [facility] PREPARING FOR SURGERY SHEET             You may not have any metal on your body including hair pins and piercings  Do not wear jewelry, make-up.  Do not wear lotions, powders, or perfumes. You may wear deodorant.   Men may shave face and neck.  Do not bring valuables to the hospital. [facility] IS NOT RESPONSIBLE FOR VALUEABLES.  Contacts, dentures or bridgework may not be worn into surgery.  Leave suitcase in the car. After surgery it may be brought to your room.  For patients admitted to the hospital, checkout time is 11:00 AM the day of discharge.     Please read over the following fact sheets that you were given: [facility] Information, Incentive Spirometer sheet  Call Birdie Sons RN pre op nurse if needed [phone]    FAILURE TO FOLLOW THESE INSTRUCTIONS MAY RESULT IN THE CANCELLATION OF YOUR SURGERY.  PATIENT SIGNATURE___________________________________________  NURSE SIGNATURE_____________________________________________

## 2013-09-10 ENCOUNTER — Encounter (HOSPITAL_COMMUNITY): Payer: Self-pay

## 2013-09-10 ENCOUNTER — Ambulatory Visit (HOSPITAL_COMMUNITY)
Admission: RE | Admit: 2013-09-10 | Discharge: 2013-09-10 | Disposition: A | Discharge status: Home / Self Care | Payer: Medicare Other | Source: Ambulatory Visit | Attending: Surgical | Admitting: Surgical

## 2013-09-10 ENCOUNTER — Encounter (HOSPITAL_COMMUNITY)
Admission: RE | Admit: 2013-09-10 | Discharge: 2013-09-10 | Disposition: A | Discharge status: Home / Self Care | Payer: Medicare Other | Source: Ambulatory Visit | Attending: Orthopedic Surgery | Admitting: Orthopedic Surgery

## 2013-09-10 ENCOUNTER — Encounter (INDEPENDENT_AMBULATORY_CARE_PROVIDER_SITE_OTHER): Payer: Self-pay

## 2013-09-10 DIAGNOSIS — Z01818 Encounter for other preprocedural examination: Secondary | ICD-10-CM | POA: Insufficient documentation

## 2013-09-10 DIAGNOSIS — Z01812 Encounter for preprocedural laboratory examination: Secondary | ICD-10-CM | POA: Insufficient documentation

## 2013-09-10 DIAGNOSIS — M4310 Spondylolisthesis, site unspecified: Secondary | ICD-10-CM | POA: Insufficient documentation

## 2013-09-10 DIAGNOSIS — M51379 Other intervertebral disc degeneration, lumbosacral region without mention of lumbar back pain or lower extremity pain: Secondary | ICD-10-CM | POA: Insufficient documentation

## 2013-09-10 DIAGNOSIS — M5137 Other intervertebral disc degeneration, lumbosacral region: Secondary | ICD-10-CM | POA: Insufficient documentation

## 2013-09-10 HISTORY — DX: Gastro-esophageal reflux disease without esophagitis: K21.9

## 2013-09-10 HISTORY — DX: Acute myocardial infarction, unspecified: I21.9

## 2013-09-10 LAB — URINALYSIS, ROUTINE W REFLEX MICROSCOPIC
Glucose, UA: NEGATIVE mg/dL
Hgb urine dipstick: NEGATIVE
Ketones, ur: NEGATIVE mg/dL
Nitrite: NEGATIVE
Protein, ur: NEGATIVE mg/dL
Specific Gravity, Urine: 1.022 (ref 1.005–1.030)
Urobilinogen, UA: 1 mg/dL (ref 0.0–1.0)
pH: 5.5 (ref 5.0–8.0)

## 2013-09-10 LAB — COMPREHENSIVE METABOLIC PANEL
ALT: 32 U/L (ref 0–53)
AST: 27 U/L (ref 0–37)
Albumin: 3.8 g/dL (ref 3.5–5.2)
Alkaline Phosphatase: 78 U/L (ref 39–117)
BUN: 20 mg/dL (ref 6–23)
CO2: 24 mEq/L (ref 19–32)
Calcium: 8.8 mg/dL (ref 8.4–10.5)
Chloride: 103 mEq/L (ref 96–112)
Creatinine, Ser: 0.74 mg/dL (ref 0.50–1.35)
GFR calc Af Amer: >90 mL/min (ref >90)
GFR calc non Af Amer: >90 mL/min (ref >90)
Glucose, Bld: 91 mg/dL (ref 70–99)
Potassium: 3.9 mEq/L (ref 3.5–5.1)
Sodium: 138 mEq/L (ref 135–145)
Total Bilirubin: 0.3 mg/dL (ref 0.3–1.2)
Total Protein: 6.9 g/dL (ref 6.0–8.3)

## 2013-09-10 LAB — PROTIME-INR
INR: 0.98 (ref 0.00–1.49)
Prothrombin Time: 12.8 seconds (ref 11.6–15.2)

## 2013-09-10 LAB — URINE MICROSCOPIC-ADD ON

## 2013-09-10 LAB — CBC
Platelets: 205 10*3/uL (ref 150–400)
RBC: 5.88 MIL/uL — ABNORMAL HIGH (ref 4.22–5.81)
RDW: 14.5 % (ref 11.5–15.5)
WBC: 9.2 10*3/uL (ref 4.0–10.5)

## 2013-09-10 LAB — APTT: aPTT: 30 seconds (ref 24–37)

## 2013-09-10 LAB — SURGICAL PCR SCREEN: MRSA, PCR: NEGATIVE

## 2013-09-11 LAB — URINE CULTURE
Colony Count: NO GROWTH
Culture: NO GROWTH

## 2013-09-15 NOTE — H&P (Signed)
Robert [name] is an 67 y.o. male.   Chief Complaint: back pain HPI: Robert [name] is 67 year old male who presented to Dr. Jeannetta [name] office in [place] with the sudden increased in low back pain. He did not recall a specific injury but has increased pain after playing golf. He reported pain radiating down the posterior aspect of both legs, right greater than left. He denied numbness and tingling as well as change in bladder or bowel function. MRI revealed he has a large herniated disk at L4-5 on the right. He has a synovial facet cyst L4-5 on the right.  Past Medical History  Diagnosis Date  . ALLERGIC RHINITIS 08/20/2007  . BENIGN PROSTATIC HYPERTROPHY 08/20/2007  . CAD (coronary artery disease)     a. s/p CABG x 3 in 1989;  b. MI in 1995 with BMS to VG->RCA;  c. 07/2000 PCI/BMS to native LCX;  d. Known CTO of VG->Diag;  e. Mult caths w/ stable anatomy;  f. 04/2013 Cath: LM nl, LAD 100, LCX 10%isr, 4m, OM1/2/3 min irregs, RI min irregs, RCA 80-90p/147m, VG->RCA 20-30isr, LIMA->LAD ok, EF 60%-->Med Rx.  . ELBOW PAIN, LEFT 04/15/2008  . ERECTILE DYSFUNCTION 08/20/2007  . HYPERLIPIDEMIA TYPE I / IV 09/03/2008  . HYPERTENSION 08/20/2007  . INSOMNIA-SLEEP DISORDER-UNSPEC 03/17/2008  . PREMATURE VENTRICULAR CONTRACTIONS 08/20/2007  . SCROTAL MASS 10/09/2008  . URINARY INCONTINENCE, MALE 10/09/2008  . UTI 04/24/2009    "just this once" (05/15/2013)  . History of meningitis     2nd grade, 8th grade, 9th grade, age 19  . Microcytosis   . OSA on CPAP   . Chronic lower back pain   . Anginal pain May 13 2013    cath done=everything ok  . Myocardial infarction June 1995  . Asthma     "very slight"   . GERD (gastroesophageal reflux disease)   . Arthritis     "back; knees; shoulders"     Past Surgical History  Procedure Laterality Date  . Transurethral resection of prostate  10/02/2009    Gyrus transurethral resection   . Cystoscopy  08/07/2009    and right hydrocelectomy  . Colonoscopy w/ biopsies  and polypectomy  11/20/2003    adenomatous polyp, internal and external hemorrhoids  . Foot tendon surgery Left 2012    "donor tendon"   . Shoulder open rotator cuff repair Right 2009    Gioffre  . Ankle fracture surgery Right 1984  . Surgery scrotal / testicular Right 2012    "removed the sac" (05/15/2013)  . Coronary artery bypass graft  1989    triple bypass [facility]  . Inguinal hernia repair Bilateral 1970's    "2 on one side; 1 on the other"   . Tonsillectomy and adenoidectomy  ~1961  . Cardiac catheterization      several  . Coronary angioplasty  95, 98, 2000, 2001    Family History  Problem Relation Age of Onset  . Heart disease Brother   . Esophageal cancer Brother   . Coronary artery disease Father   . Coronary artery disease Mother     pacemaker   Social History:  reports that he quit smoking about 19 years ago. His smoking use included Cigarettes. He has a 45 pack-year smoking history. He has never used smokeless tobacco. He reports that he does not drink alcohol or use illicit drugs.  Allergies:  Allergies  Allergen Reactions  . Metoprolol     REACTION: severe bradycardia     Current outpatient  prescriptions: albuterol (PROAIR HFA) 108 (90 BASE) MCG/ACT inhaler, Inhale 2 puffs into the lungs every 6 (six) hours as needed for wheezing or shortness of breath. , Disp: , Rfl: ;  amitriptyline (ELAVIL) 25 MG tablet, Take 25 mg by mouth at bedtime. , Disp: , Rfl: ;   aspirin 81 MG tablet, Take 81 mg by mouth daily. , Disp: , Rfl: ;   atenolol (TENORMIN) 25 MG tablet, Take 25 mg by mouth at bedtime., Disp: , Rfl:  clopidogrel (PLAVIX) 75 MG tablet, Take 75 mg by mouth daily. , Disp: , Rfl: ;   isosorbide mononitrate (IMDUR) 30 MG 24 hr tablet, Take 30 mg by mouth daily., Disp: , Rfl: ;   lisinopril (PRINIVIL,ZESTRIL) 10 MG tablet, Take 10 mg by mouth at bedtime., Disp: , Rfl: ;   Multiple Vitamin (MULTIVITAMIN WITH MINERALS) TABS tablet, Take 1 tablet by mouth every  morning., Disp: , Rfl:  nitroGLYCERIN (NITROSTAT) 0.4 MG SL tablet, Place 0.4 mg under the tongue every 5 (five) minutes as needed for chest pain., Disp: , Rfl: ;   omeprazole (PRILOSEC) 20 MG capsule, Take 20 mg by mouth daily., Disp: , Rfl: ;   simvastatin (ZOCOR) 80 MG tablet, Take 40 mg by mouth at bedtime. , Disp: , Rfl: ;   sulindac (CLINORIL) 200 MG tablet, Take 200 mg by mouth 2 (two) times daily. , Disp: , Rfl:  temazepam (RESTORIL) 30 MG capsule, Take 30 mg by mouth at bedtime as needed for sleep. , Disp: , Rfl:   Review of Systems  Constitutional: Negative.   HENT: Negative.   Eyes: Negative.   Respiratory: Positive for shortness of breath. Negative for cough, hemoptysis, sputum production and wheezing.   Cardiovascular: Positive for chest pain. Negative for palpitations, orthopnea, claudication, leg swelling and PND.       Chest pain and SOB with exertion  Gastrointestinal: Negative.   Genitourinary: Positive for frequency. Negative for dysuria, urgency, hematuria and flank pain.  Musculoskeletal: Positive for back pain. Negative for falls, joint pain, myalgias and neck pain.  Skin: Negative.   Neurological: Negative.   Endo/Heme/Allergies: Negative.   Psychiatric/Behavioral: Negative.     Physical Exam  Constitutional: He is oriented to person, place, and time. He appears well-developed and well-nourished. No distress.  HENT:  Head: Normocephalic and atraumatic.  Right Ear: External ear normal.  Left Ear: External ear normal.  Nose: Nose normal.  Mouth/Throat: Oropharynx is clear and moist.  Eyes: Conjunctivae and EOM are normal.  Neck: Normal range of motion. Neck supple.  Cardiovascular: Normal rate, regular rhythm, normal heart sounds and intact distal pulses.   No murmur heard. Respiratory: Effort normal and breath sounds normal.  GI: Soft. Bowel sounds are normal.  Musculoskeletal:       Right hip: Normal.       Left hip: Normal.       Right knee: Normal.        Left knee: Normal.       Lumbar back: He exhibits decreased range of motion, tenderness, pain and spasm.       Right lower leg: He exhibits no tenderness and no swelling.       Left lower leg: He exhibits no tenderness and no swelling.  Neurological: He is alert and oriented to person, place, and time. He has normal reflexes. No sensory deficit.  Weak dorsiflexors 3/5 on the right. Otherwise neurologically intact.   Skin: No rash noted. He is not diaphoretic. No erythema.  Psychiatric: He has a normal mood and affect. His behavior is normal.     Assessment/Plan Lumber disc herniation and synovial cyst L4-L5 right He needs a lumbar microdiscectomy and hemilaminectomy with excision of synovial cyst at L4-L5 on the right. Risks and benefits of the surgery discussed with the patient and his family by Dr. Darrelyn Hillock.   Robert [name] LAUREN 09/15/2013, 2:52 PM

## 2013-09-18 ENCOUNTER — Encounter (HOSPITAL_COMMUNITY)
Admission: RE | Disposition: A | Discharge status: Home / Self Care | Payer: Self-pay | Source: Ambulatory Visit | Attending: Orthopedic Surgery

## 2013-09-18 ENCOUNTER — Encounter (HOSPITAL_COMMUNITY): Payer: Self-pay | Admitting: *Deleted

## 2013-09-18 ENCOUNTER — Encounter (HOSPITAL_COMMUNITY): Payer: Medicare Other | Admitting: Anesthesiology

## 2013-09-18 ENCOUNTER — Ambulatory Visit (HOSPITAL_COMMUNITY): Payer: Medicare Other | Admitting: Anesthesiology

## 2013-09-18 ENCOUNTER — Ambulatory Visit (HOSPITAL_COMMUNITY): Payer: Medicare Other

## 2013-09-18 ENCOUNTER — Observation Stay (HOSPITAL_COMMUNITY)
Admission: RE | Admit: 2013-09-18 | Discharge: 2013-09-19 | Disposition: A | Discharge status: Home / Self Care | Payer: Medicare Other | Source: Ambulatory Visit | Attending: Orthopedic Surgery | Admitting: Orthopedic Surgery

## 2013-09-18 DIAGNOSIS — G4733 Obstructive sleep apnea (adult) (pediatric): Secondary | ICD-10-CM | POA: Insufficient documentation

## 2013-09-18 DIAGNOSIS — Z87891 Personal history of nicotine dependence: Secondary | ICD-10-CM | POA: Insufficient documentation

## 2013-09-18 DIAGNOSIS — M545 Low back pain, unspecified: Secondary | ICD-10-CM | POA: Insufficient documentation

## 2013-09-18 DIAGNOSIS — E785 Hyperlipidemia, unspecified: Secondary | ICD-10-CM | POA: Insufficient documentation

## 2013-09-18 DIAGNOSIS — I10 Essential (primary) hypertension: Secondary | ICD-10-CM | POA: Insufficient documentation

## 2013-09-18 DIAGNOSIS — M216X9 Other acquired deformities of unspecified foot: Secondary | ICD-10-CM | POA: Insufficient documentation

## 2013-09-18 DIAGNOSIS — Z79899 Other long term (current) drug therapy: Secondary | ICD-10-CM | POA: Insufficient documentation

## 2013-09-18 DIAGNOSIS — M48062 Spinal stenosis, lumbar region with neurogenic claudication: Principal | ICD-10-CM | POA: Diagnosis present

## 2013-09-18 DIAGNOSIS — I252 Old myocardial infarction: Secondary | ICD-10-CM | POA: Insufficient documentation

## 2013-09-18 DIAGNOSIS — M7130 Other bursal cyst, unspecified site: Secondary | ICD-10-CM | POA: Insufficient documentation

## 2013-09-18 DIAGNOSIS — Z8661 Personal history of infections of the central nervous system: Secondary | ICD-10-CM | POA: Insufficient documentation

## 2013-09-18 DIAGNOSIS — Z951 Presence of aortocoronary bypass graft: Secondary | ICD-10-CM | POA: Insufficient documentation

## 2013-09-18 DIAGNOSIS — J45909 Unspecified asthma, uncomplicated: Secondary | ICD-10-CM | POA: Insufficient documentation

## 2013-09-18 DIAGNOSIS — Z7982 Long term (current) use of aspirin: Secondary | ICD-10-CM | POA: Insufficient documentation

## 2013-09-18 DIAGNOSIS — G8929 Other chronic pain: Secondary | ICD-10-CM | POA: Insufficient documentation

## 2013-09-18 DIAGNOSIS — M5126 Other intervertebral disc displacement, lumbar region: Secondary | ICD-10-CM | POA: Insufficient documentation

## 2013-09-18 DIAGNOSIS — M129 Arthropathy, unspecified: Secondary | ICD-10-CM | POA: Insufficient documentation

## 2013-09-18 DIAGNOSIS — N400 Enlarged prostate without lower urinary tract symptoms: Secondary | ICD-10-CM | POA: Insufficient documentation

## 2013-09-18 DIAGNOSIS — K219 Gastro-esophageal reflux disease without esophagitis: Secondary | ICD-10-CM | POA: Insufficient documentation

## 2013-09-18 DIAGNOSIS — I2510 Atherosclerotic heart disease of native coronary artery without angina pectoris: Secondary | ICD-10-CM | POA: Insufficient documentation

## 2013-09-18 HISTORY — PX: HEMI-MICRODISCECTOMY LUMBAR LAMINECTOMY LEVEL 1: SHX5846

## 2013-09-18 SURGERY — HEMI-MICRODISCECTOMY LUMBAR LAMINECTOMY LEVEL 1
Anesthesia: General | Site: Back | Laterality: Right

## 2013-09-18 MED ORDER — THROMBIN 5000 UNITS EX SOLR
CUTANEOUS | Status: AC
  Filled 2013-09-18: qty 10000

## 2013-09-18 MED ORDER — PHENOL 1.4 % MT LIQD
1.0000 | OROMUCOSAL | Status: DC | PRN
  Filled 2013-09-18: qty 177

## 2013-09-18 MED ORDER — BUPIVACAINE-EPINEPHRINE 0.5% -1:200000 IJ SOLN
INTRAMUSCULAR | Status: AC
  Filled 2013-09-18: qty 1

## 2013-09-18 MED ORDER — MENTHOL 3 MG MT LOZG
1.0000 | LOZENGE | OROMUCOSAL | Status: DC | PRN
  Filled 2013-09-18: qty 9

## 2013-09-18 MED ORDER — PHENYLEPHRINE HCL 10 MG/ML IJ SOLN
INTRAMUSCULAR | Status: DC | PRN
  Administered 2013-09-18 (×2): 120 ug via INTRAVENOUS

## 2013-09-18 MED ORDER — ONDANSETRON HCL 4 MG/2ML IJ SOLN: 4.0000 mg | INTRAMUSCULAR | Status: DC | PRN

## 2013-09-18 MED ORDER — ATENOLOL 25 MG PO TABS
25.0000 mg | ORAL_TABLET | Freq: Every day | ORAL | Status: DC
  Administered 2013-09-18: 25 mg via ORAL
  Filled 2013-09-18 (×2): qty 1

## 2013-09-18 MED ORDER — ATENOLOL 25 MG PO TABS
25.0000 mg | ORAL_TABLET | Freq: Every day | ORAL | Status: DC
  Filled 2013-09-18: qty 1

## 2013-09-18 MED ORDER — FENTANYL CITRATE 0.05 MG/ML IJ SOLN
INTRAMUSCULAR | Status: AC
  Filled 2013-09-18: qty 5

## 2013-09-18 MED ORDER — CISATRACURIUM BESYLATE 20 MG/10ML IV SOLN
INTRAVENOUS | Status: AC
  Filled 2013-09-18: qty 10

## 2013-09-18 MED ORDER — PROPOFOL 10 MG/ML IV BOLUS
INTRAVENOUS | Status: AC
  Filled 2013-09-18: qty 20

## 2013-09-18 MED ORDER — ONDANSETRON HCL 4 MG/2ML IJ SOLN
INTRAMUSCULAR | Status: AC
  Filled 2013-09-18: qty 2

## 2013-09-18 MED ORDER — PANTOPRAZOLE SODIUM 40 MG PO TBEC
40.0000 mg | DELAYED_RELEASE_TABLET | Freq: Every day | ORAL | Status: DC
  Administered 2013-09-19: 40 mg via ORAL
  Filled 2013-09-18: qty 1

## 2013-09-18 MED ORDER — TEMAZEPAM 15 MG PO CAPS
30.0000 mg | ORAL_CAPSULE | Freq: Every evening | ORAL | Status: DC | PRN
  Administered 2013-09-18: 20:00:00 30 mg via ORAL
  Filled 2013-09-18: qty 2

## 2013-09-18 MED ORDER — BUPIVACAINE LIPOSOME 1.3 % IJ SUSP
20.0000 mL | Freq: Once | INTRAMUSCULAR | Status: AC
  Administered 2013-09-18: 20 mL
  Filled 2013-09-18: qty 20

## 2013-09-18 MED ORDER — NITROGLYCERIN 0.4 MG SL SUBL: 0.4000 mg | SUBLINGUAL_TABLET | SUBLINGUAL | Status: DC | PRN

## 2013-09-18 MED ORDER — FENTANYL CITRATE 0.05 MG/ML IJ SOLN
INTRAMUSCULAR | Status: AC
  Filled 2013-09-18: qty 2

## 2013-09-18 MED ORDER — MIDAZOLAM HCL 2 MG/2ML IJ SOLN
INTRAMUSCULAR | Status: AC
  Filled 2013-09-18: qty 2

## 2013-09-18 MED ORDER — MIDAZOLAM HCL 5 MG/5ML IJ SOLN
INTRAMUSCULAR | Status: DC | PRN
  Administered 2013-09-18: 2 mg via INTRAVENOUS

## 2013-09-18 MED ORDER — ACETAMINOPHEN 325 MG PO TABS: 650.0000 mg | ORAL_TABLET | ORAL | Status: DC | PRN

## 2013-09-18 MED ORDER — POLYETHYLENE GLYCOL 3350 17 G PO PACK: 17.0000 g | PACK | Freq: Every day | ORAL | Status: DC | PRN

## 2013-09-18 MED ORDER — HYDROMORPHONE HCL 2 MG PO TABS
2.0000 mg | ORAL_TABLET | ORAL | Status: DC | PRN
  Administered 2013-09-18 – 2013-09-19 (×5): 2 mg via ORAL
  Filled 2013-09-18 (×5): qty 1

## 2013-09-18 MED ORDER — ISOSORBIDE MONONITRATE ER 30 MG PO TB24
30.0000 mg | ORAL_TABLET | Freq: Every day | ORAL | Status: DC
  Administered 2013-09-19: 10:00:00 30 mg via ORAL
  Filled 2013-09-18: qty 1

## 2013-09-18 MED ORDER — LACTATED RINGERS IV SOLN
INTRAVENOUS | Status: DC
  Administered 2013-09-18: 21:00:00 via INTRAVENOUS
  Administered 2013-09-18: 11:00:00 100 mL/h via INTRAVENOUS

## 2013-09-18 MED ORDER — MEPERIDINE HCL 50 MG/ML IJ SOLN: 6.2500 mg | INTRAMUSCULAR | Status: DC | PRN

## 2013-09-18 MED ORDER — PHENYLEPHRINE HCL 10 MG/ML IJ SOLN
10.0000 mg | INTRAVENOUS | Status: DC | PRN
  Administered 2013-09-18: 100 ug/min via INTRAVENOUS

## 2013-09-18 MED ORDER — FENTANYL CITRATE 0.05 MG/ML IJ SOLN
INTRAMUSCULAR | Status: DC | PRN
  Administered 2013-09-18: 100 ug via INTRAVENOUS

## 2013-09-18 MED ORDER — FENTANYL CITRATE 0.05 MG/ML IJ SOLN
25.0000 ug | INTRAMUSCULAR | Status: DC | PRN
  Administered 2013-09-18: 25 ug via INTRAVENOUS

## 2013-09-18 MED ORDER — EPHEDRINE SULFATE 50 MG/ML IJ SOLN
INTRAMUSCULAR | Status: DC | PRN
  Administered 2013-09-18: 15 mg via INTRAVENOUS
  Administered 2013-09-18 (×2): 7.5 mg via INTRAVENOUS

## 2013-09-18 MED ORDER — METHOCARBAMOL 100 MG/ML IJ SOLN
500.0000 mg | Freq: Four times a day (QID) | INTRAVENOUS | Status: DC | PRN
  Administered 2013-09-18: 500 mg via INTRAVENOUS
  Filled 2013-09-18: qty 5

## 2013-09-18 MED ORDER — METHOCARBAMOL 500 MG PO TABS: 500.0000 mg | ORAL_TABLET | Freq: Four times a day (QID) | ORAL | Status: DC | PRN

## 2013-09-18 MED ORDER — BISACODYL 10 MG RE SUPP: 10.0000 mg | Freq: Every day | RECTAL | Status: DC | PRN

## 2013-09-18 MED ORDER — PROMETHAZINE HCL 25 MG/ML IJ SOLN: 6.2500 mg | INTRAMUSCULAR | Status: DC | PRN

## 2013-09-18 MED ORDER — HYDROMORPHONE HCL PF 1 MG/ML IJ SOLN: 0.5000 mg | INTRAMUSCULAR | Status: DC | PRN

## 2013-09-18 MED ORDER — ATORVASTATIN CALCIUM 40 MG PO TABS
40.0000 mg | ORAL_TABLET | Freq: Every day | ORAL | Status: DC
  Administered 2013-09-18: 17:00:00 40 mg via ORAL
  Filled 2013-09-18 (×2): qty 1

## 2013-09-18 MED ORDER — CISATRACURIUM BESYLATE (PF) 10 MG/5ML IV SOLN
INTRAVENOUS | Status: DC | PRN
  Administered 2013-09-18: 8 mg via INTRAVENOUS

## 2013-09-18 MED ORDER — SODIUM CHLORIDE 0.9 % IR SOLN
Status: DC | PRN
  Administered 2013-09-18: 09:00:00

## 2013-09-18 MED ORDER — FLEET ENEMA 7-19 GM/118ML RE ENEM: 1.0000 | ENEMA | Freq: Once | RECTAL | Status: AC | PRN

## 2013-09-18 MED ORDER — CEFAZOLIN SODIUM 1-5 GM-% IV SOLN
1.0000 g | Freq: Three times a day (TID) | INTRAVENOUS | Status: AC
  Administered 2013-09-18 – 2013-09-19 (×3): 1 g via INTRAVENOUS
  Filled 2013-09-18 (×3): qty 50

## 2013-09-18 MED ORDER — DEXAMETHASONE SODIUM PHOSPHATE 10 MG/ML IJ SOLN
INTRAMUSCULAR | Status: DC | PRN
  Administered 2013-09-18: 10 mg via INTRAVENOUS

## 2013-09-18 MED ORDER — LISINOPRIL 10 MG PO TABS
10.0000 mg | ORAL_TABLET | Freq: Every day | ORAL | Status: DC
  Administered 2013-09-18: 22:00:00 10 mg via ORAL
  Filled 2013-09-18 (×2): qty 1

## 2013-09-18 MED ORDER — AMITRIPTYLINE HCL 25 MG PO TABS
25.0000 mg | ORAL_TABLET | Freq: Every day | ORAL | Status: DC
  Administered 2013-09-18: 25 mg via ORAL
  Filled 2013-09-18 (×2): qty 1

## 2013-09-18 MED ORDER — LACTATED RINGERS IV SOLN
INTRAVENOUS | Status: DC
  Administered 2013-09-18 (×2): via INTRAVENOUS

## 2013-09-18 MED ORDER — DEXAMETHASONE SODIUM PHOSPHATE 10 MG/ML IJ SOLN
INTRAMUSCULAR | Status: AC
  Filled 2013-09-18: qty 1

## 2013-09-18 MED ORDER — PHENYLEPHRINE 40 MCG/ML (10ML) SYRINGE FOR IV PUSH (FOR BLOOD PRESSURE SUPPORT)
PREFILLED_SYRINGE | INTRAVENOUS | Status: AC
  Filled 2013-09-18: qty 10

## 2013-09-18 MED ORDER — CEFAZOLIN SODIUM-DEXTROSE 2-3 GM-% IV SOLR
INTRAVENOUS | Status: AC
  Filled 2013-09-18: qty 50

## 2013-09-18 MED ORDER — ACETAMINOPHEN 650 MG RE SUPP: 650.0000 mg | RECTAL | Status: DC | PRN

## 2013-09-18 MED ORDER — BACITRACIN-NEOMYCIN-POLYMYXIN 400-5-5000 EX OINT
TOPICAL_OINTMENT | CUTANEOUS | Status: AC
  Filled 2013-09-18: qty 1

## 2013-09-18 MED ORDER — SODIUM CHLORIDE 0.9 % IJ SOLN
INTRAMUSCULAR | Status: AC
  Filled 2013-09-18: qty 50

## 2013-09-18 MED ORDER — ALBUTEROL SULFATE HFA 108 (90 BASE) MCG/ACT IN AERS
2.0000 | INHALATION_SPRAY | Freq: Four times a day (QID) | RESPIRATORY_TRACT | Status: DC | PRN
  Filled 2013-09-18: qty 6.7

## 2013-09-18 MED ORDER — BACITRACIN-NEOMYCIN-POLYMYXIN 400-5-5000 EX OINT
TOPICAL_OINTMENT | CUTANEOUS | Status: DC | PRN
  Administered 2013-09-18: 1 via TOPICAL

## 2013-09-18 MED ORDER — PROPOFOL 10 MG/ML IV BOLUS
INTRAVENOUS | Status: DC | PRN
  Administered 2013-09-18: 180 mg via INTRAVENOUS

## 2013-09-18 MED ORDER — HYDROMORPHONE HCL 2 MG PO TABS: 2.0000 mg | ORAL_TABLET | ORAL | Status: DC | PRN

## 2013-09-18 MED ORDER — CEFAZOLIN SODIUM-DEXTROSE 2-3 GM-% IV SOLR
2.0000 g | INTRAVENOUS | Status: AC
Start: 2013-09-18 — End: 2013-09-18
  Administered 2013-09-18: 2 g via INTRAVENOUS

## 2013-09-18 SURGICAL SUPPLY — 44 items
APL SKNCLS STERI-STRIP NONHPOA (GAUZE/BANDAGES/DRESSINGS) ×1
BAG SPEC THK2 15X12 ZIP CLS (MISCELLANEOUS) ×1
BAG ZIPLOCK 12X15 (MISCELLANEOUS) ×2 IMPLANT
BENZOIN TINCTURE PRP APPL 2/3 (GAUZE/BANDAGES/DRESSINGS) ×2 IMPLANT
CLEANER TIP ELECTROSURG 2X2 (MISCELLANEOUS) ×2 IMPLANT
CONT SPECI 4OZ STER CLIK (MISCELLANEOUS) ×2 IMPLANT
DRAIN PENROSE 18X1/4 LTX STRL (WOUND CARE) IMPLANT
DRAPE MICROSCOPE LEICA (MISCELLANEOUS) ×2 IMPLANT
DRAPE POUCH INSTRU U-SHP 10X18 (DRAPES) ×2 IMPLANT
DRAPE SURG 17X11 SM STRL (DRAPES) ×2 IMPLANT
DRSG ADAPTIC 3X8 NADH LF (GAUZE/BANDAGES/DRESSINGS) ×2 IMPLANT
DRSG PAD ABDOMINAL 8X10 ST (GAUZE/BANDAGES/DRESSINGS) ×1 IMPLANT
DURAPREP 26ML APPLICATOR (WOUND CARE) ×2 IMPLANT
ELECT REM PT RETURN 9FT ADLT (ELECTROSURGICAL) ×2
ELECTRODE REM PT RTRN 9FT ADLT (ELECTROSURGICAL) ×1 IMPLANT
GLOVE BIOGEL PI IND STRL 8 (GLOVE) ×1 IMPLANT
GLOVE BIOGEL PI INDICATOR 8 (GLOVE) ×1
GLOVE ECLIPSE 8.0 STRL XLNG CF (GLOVE) ×4 IMPLANT
GOWN PREVENTION PLUS LG XLONG (DISPOSABLE) ×6 IMPLANT
GOWN STRL REIN XL XLG (GOWN DISPOSABLE) ×4 IMPLANT
KIT BASIN OR (CUSTOM PROCEDURE TRAY) ×2 IMPLANT
KIT POSITIONING SURG ANDREWS (MISCELLANEOUS) ×2 IMPLANT
MANIFOLD NEPTUNE II (INSTRUMENTS) ×2 IMPLANT
NDL SPNL 18GX3.5 QUINCKE PK (NEEDLE) ×2 IMPLANT
NEEDLE SPNL 18GX3.5 QUINCKE PK (NEEDLE) ×6 IMPLANT
NS IRRIG 1000ML POUR BTL (IV SOLUTION) ×2 IMPLANT
PAD ABD 8X10 STRL (GAUZE/BANDAGES/DRESSINGS) ×5 IMPLANT
PATTIES SURGICAL .5 X.5 (GAUZE/BANDAGES/DRESSINGS) IMPLANT
PATTIES SURGICAL .75X.75 (GAUZE/BANDAGES/DRESSINGS) IMPLANT
PATTIES SURGICAL 1X1 (DISPOSABLE) IMPLANT
PIN SAFETY NICK PLATE  2 MED (MISCELLANEOUS)
PIN SAFETY NICK PLATE 2 MED (MISCELLANEOUS) IMPLANT
POSITIONER SURGICAL ARM (MISCELLANEOUS) ×2 IMPLANT
SPONGE GAUZE 4X4 12PLY (GAUZE/BANDAGES/DRESSINGS) ×1 IMPLANT
SPONGE LAP 4X18 X RAY DECT (DISPOSABLE) IMPLANT
SPONGE SURGIFOAM ABS GEL 100 (HEMOSTASIS) ×2 IMPLANT
STAPLER VISISTAT 35W (STAPLE) IMPLANT
SUT VIC AB 0 CT1 27 (SUTURE) ×2
SUT VIC AB 0 CT1 27XBRD ANTBC (SUTURE) ×1 IMPLANT
SUT VIC AB 1 CT1 27 (SUTURE) ×8
SUT VIC AB 1 CT1 27XBRD ANTBC (SUTURE) ×4 IMPLANT
TOWEL OR 17X26 10 PK STRL BLUE (TOWEL DISPOSABLE) ×3 IMPLANT
TRAY LAMINECTOMY (CUSTOM PROCEDURE TRAY) ×2 IMPLANT
WATER STERILE IRR 1500ML POUR (IV SOLUTION) ×1 IMPLANT

## 2013-09-18 NOTE — Anesthesia Postprocedure Evaluation (Signed)
  Anesthesia Post-op Note  Patient: Robert [name]  Procedure(s) Performed: Procedure(s) (LRB): CENTRAL DECOMPRESSION @ L4-5 FOR SPINAL STENOSIS. EXCISION OF SYNOVIAL CYST @ L4-5 RIGHT, MICRODISCECTOMY @L4 -5 RIGHT  (Right)  Patient Location: PACU  Anesthesia Type: General  Level of Consciousness: awake and alert   Airway and Oxygen Therapy: Patient Spontanous Breathing  Post-op Pain: mild  Post-op Assessment: Post-op Vital signs reviewed, Patient's Cardiovascular Status Stable, Respiratory Function Stable, Patent Airway and No signs of Nausea or vomiting  Last Vitals:  Filed Vitals:   09/18/13 0945  BP: 137/81  Pulse: 81  Temp: 36.8 C  Resp: 17    Post-op Vital Signs: stable   Complications: No apparent anesthesia complications

## 2013-09-18 NOTE — Transfer of Care (Signed)
Immediate Anesthesia Transfer of Care Note  Patient: Robert [name]  Procedure(s) Performed: Procedure(s): CENTRAL DECOMPRESSION @ L4-5 FOR SPINAL STENOSIS. EXCISION OF SYNOVIAL CYST @ L4-5 RIGHT, MICRODISCECTOMY @L4 -5 RIGHT  (Right)  Patient Location: PACU  Anesthesia Type:General  Level of Consciousness: awake, alert , oriented and patient cooperative  Airway & Oxygen Therapy: Patient Spontanous Breathing and Patient connected to face mask oxygen  Post-op Assessment: Report given to PACU RN and Post -op Vital signs reviewed and stable  Post vital signs: Reviewed and stable  Complications: No apparent anesthesia complications

## 2013-09-18 NOTE — Progress Notes (Signed)
Utilization review completed.  

## 2013-09-18 NOTE — Interval H&P Note (Signed)
History and Physical Interval Note:  09/18/2013 7:18 AM  Robert [name]  has presented today for surgery, with the diagnosis of spinal stenosis  The various methods of treatment have been discussed with the patient and family. After consideration of risks, benefits and other options for treatment, the patient has consented to  Procedure(s): HEMI-LUMBAR LAMINECTOMY L4 - L5 ON THE RIGHT WITH REMOVAL OF SYNOVIAL CYST LEVEL 1 (Right) as a surgical intervention .  The patient's history has been reviewed, patient examined, no change in status, stable for surgery.  I have reviewed the patient's chart and labs.  Questions were answered to the patient's satisfaction.     Safa Derner A

## 2013-09-18 NOTE — Anesthesia Preprocedure Evaluation (Addendum)
Anesthesia Evaluation  Patient identified by MRN, date of birth, ID band Patient awake    Reviewed: Allergy & Precautions, H&P , NPO status , Patient's Chart, lab work & pertinent test results  Airway Mallampati: III TM Distance: >3 FB Neck ROM: Full    Dental no notable dental hx.    Pulmonary asthma , sleep apnea and Continuous Positive Airway Pressure Ventilation , former smoker,  breath sounds clear to auscultation  Pulmonary exam normal       Cardiovascular hypertension, Pt. on medications + angina + CAD, + Past MI and + CABG Rhythm:Regular Rate:Normal     Neuro/Psych negative neurological ROS  negative psych ROS   GI/Hepatic Neg liver ROS, GERD-  Medicated and Controlled,  Endo/Other  negative endocrine ROS  Renal/GU negative Renal ROS  negative genitourinary   Musculoskeletal negative musculoskeletal ROS (+)   Abdominal   Peds negative pediatric ROS (+)  Hematology negative hematology ROS (+)   Anesthesia Other Findings   Reproductive/Obstetrics negative OB ROS                          Anesthesia Physical Anesthesia Plan  ASA: III  Anesthesia Plan: General   Post-op Pain Management:    Induction: Intravenous  Airway Management Planned: Oral ETT  Additional Equipment:   Intra-op Plan:   Post-operative Plan: Extubation in OR  Informed Consent: I have reviewed the patients History and Physical, chart, labs and discussed the procedure including the risks, benefits and alternatives for the proposed anesthesia with the patient or authorized representative who has indicated his/her understanding and acceptance.   Dental advisory given  Plan Discussed with: CRNA  Anesthesia Plan Comments:         Anesthesia Quick Evaluation

## 2013-09-18 NOTE — Brief Op Note (Signed)
09/18/2013  9:14 AM  PATIENT:  Robert [name]  67 y.o. male  PRE-OPERATIVE DIAGNOSIS:  spinal stenosis,Synovial Cyst and herniated Disc at L-4-L-5 on the Right with partial foot drop on the Right  POST-OPERATIVE DIAGNOSIS: Same as Pre-Op  PROCEDURE:  Procedure(s): CENTRAL DECOMPRESSION @ L4-5 FOR SPINAL STENOSIS. EXCISION OF SYNOVIAL CYST @ L4-5 RIGHT, MICRODISCECTOMY @L4 -5 RIGHT  (Right)  SURGEON:  Surgeon(s) and Role:    * Jacki Cones, MD - Primary    * Javier Docker, MD - Assisting     ASSISTANTS: Jene Every MD   ANESTHESIA:   general  EBL:  Total I/O In: 1450 [I.V.:1450] Out: -   BLOOD ADMINISTERED:none  DRAINS: none   LOCAL MEDICATIONS USED:  MARCAINE 20cc of 0.50%  With Epinephrine at start of case  and At end of case Exparel 20cc  SPECIMEN:  No Specimen  DISPOSITION OF SPECIMEN:  N/A  COUNTS:  YES  TOURNIQUET:  * No tourniquets in log *  DICTATION: .Other Dictation: Dictation Number [id number]  PLAN OF CARE: Admit for overnight observation  PATIENT DISPOSITION:  PACU - hemodynamically stable.   Delay start of Pharmacological VTE agent (>24hrs) due to surgical blood loss or risk of bleeding: yes

## 2013-09-19 MED ORDER — HYDROMORPHONE HCL 2 MG PO TABS: 2.0000 mg | ORAL_TABLET | ORAL | Status: DC | PRN

## 2013-09-19 MED ORDER — METHOCARBAMOL 500 MG PO TABS: 500.0000 mg | ORAL_TABLET | Freq: Four times a day (QID) | ORAL | Status: DC | PRN

## 2013-09-19 NOTE — Op Note (Signed)
NAMECAYMEN, DUBRAY             ACCOUNT NO.:  [id number]  MEDICAL RECORD NO.:  [id number]  LOCATION:  77                         FACILITY:  [facility]  PHYSICIAN:  Kipp Brood. Trevell Pariseau, M.D.DATE OF BIRTH:  [birth year]  DATE OF PROCEDURE:  09/18/2013 DATE OF DISCHARGE:                              OPERATIVE REPORT   SURGEON:  Kipp Brood. Juluis Fitzsimmons, MD  OPERATIVE ASSISTANT:  Susa Day, MD  PREOPERATIVE DIAGNOSES: 1. Spinal stenosis at L4-5. 2. Large synovial cyst at L4-5 on the right. 3. Large herniated disk, central to the right at L4-5. 4. Partial footdrop on the right secondary to the above.  POSTOPERATIVE DIAGNOSES: 1. Spinal stenosis at L4-5. 2. Large synovial cyst at L4-5 on the right. 3. Large herniated disk, central to the right at L4-5. 4. Partial footdrop on the right secondary to the above.  OPERATION: 1. Central decompressive lumbar laminectomy at L4-5 for spinal     stenosis. 2. Excision of a synovial cyst with the facet at L4-5 on the right. 3. Microdiskectomy at L4-5 on the right. 4. Foraminotomies for the L4 root and the L5 root on the right.  DESCRIPTION OF PROCEDURE:  Under general anesthesia, routine orthopedic prep and draping was carried out.  The patient on a spinal frame, routine time-out was first carried out.  I also marked the right side of his back in the holding area.  The patient had 2 g of IV Ancef.  Prior to the time-out, we did a sterile prep of the back, then the time-out as I mentioned above, then 2 needles were placed in the back for localization purposes.  X-ray was taken to identify the space. Following that, we then verified the time-out and verified the position. An incision was made over L4-5 space, bleeders identified and cauterized.  The muscle was separated from the lamina and spinous processes bilaterally.  At this time, another x-ray was taken with instruments in place.  Following that, the [facility] retractors were inserted.  We  then decided because of the extreme tightness of the L4-5 space that we go down centrally.  We were down centrally, did a central decompressive lumbar laminectomy.  We avoided the facet in the opposite side.  We then went out to the right, we identified the dura, the microscope was brought in and we gently excised the ligamentum flavum. We went up a little proximal, we noted a large synovial cyst, we removed.  There was an indentation in the dura.  We then did a partial facetectomy at this level.  We then gently retracted the dura, cauterized lateral recess veins by use of the microscope, went down, identified the disk space.  The needle was placed in the disk space for localization purposes.  X-ray was taken to verify the position.  At that time, a cruciate incision was made in the posterior longitudinal ligament and a complete diskectomy was carried out.  We went subligamentous with the nerve hook and with the Epstein curettes.  We went out laterally, medially as well.  We made sure we thoroughly decompressed the dura and the root at this time.  We went above and utilized a hockey-stick to make sure that the __________  root above was free and it was.  We out laterally as well.  We went distally, did a nice foraminotomy distally as well and the nerve root now were open and free to move and the dura was free to move.  Thoroughly irrigated out the area, loosely applied some thrombin-soaked Gelfoam, and the wound was closed in layers in usual fashion.  At the beginning of the case, I injected 20 mL of 0.5% Marcaine with epinephrine.  At the end of the case, I injected 20 mL of Exparel.  The skin, as I mentioned, was closed with staples.  Sterile dressing applied.          ______________________________ Kipp Brood Gladstone Lighter, M.D.     RAG/MEDQ  D:  09/18/2013  T:  09/19/2013  Job:  [id number]

## 2013-09-19 NOTE — Evaluation (Signed)
Physical Therapy One Time Evaluation Patient Details Name: Robert [name] MRN: [id number] DOB: May 14, 1946 Today's Date: 09/19/2013 Time: 8185-6314 PT Time Calculation (min): 15 min  PT Assessment / Plan / Recommendation History of Present Illness     Clinical Impression  Patient s/p central decompressive lumbar laminectomy at L4-5 for spinal stenosis.  Pt ambulated in hallway with RW and practiced stairs.  Pt feels ready for d/c home with spouse assist. Patient evaluated by Physical Therapy with no further acute PT needs identified. All education has been completed and the patient has no further questions. No follow-up Physial Therapy or equipment needs. PT is signing off. Thank you for this referral.     PT Assessment  Patent does not need any further PT services    Follow Up Recommendations  No PT follow up    Does the patient have the potential to tolerate intense rehabilitation      Barriers to Discharge        Equipment Recommendations  None recommended by PT    Recommendations for Other Services     Frequency      Precautions / Restrictions Precautions Precautions: Back   Pertinent Vitals/Pain States 2/10 low back pain which is good per pt, repositioned     Mobility  Bed Mobility Bed Mobility: Rolling Right;Right Sidelying to Sit Rolling Right: 5: Supervision Right Sidelying to Sit: 5: Supervision Details for Bed Mobility Assistance: verbal cues for log roll technique Transfers Transfers: Sit to Stand;Stand to Sit Sit to Stand: 5: Supervision;With upper extremity assist Stand to Sit: 5: Supervision;With upper extremity assist Details for Transfer Assistance: verbal cues for straight back Ambulation/Gait Ambulation/Gait Assistance: 5: Supervision;6: Modified independent (Device/Increase time) Ambulation Distance (Feet): 400 Feet Assistive device: Rolling walker Ambulation/Gait Assistance Details: pt felt more comfortable with RW due to slight dizzy which  did not get worse so agreeable to use RW upon d/c however aware that once dizziness resolves he likely won't need RW (very little WBing through UEs and able to ambulate around room without RW) Gait Pattern: Within Functional Limits General Gait Details: pt reports no R LE symptoms post-surgery and pleased with pain control (states good at 2/10) Stairs: Yes Stairs Assistance: 4: Min guard Stairs Assistance Details (indicate cue type and reason): pt educated on safe stair technique, states his wife is able to assist him with steps so provided one HHA for steadying however no physical assist required Stair Management Technique: Forwards;Step to pattern Number of Stairs: 4    Exercises     PT Diagnosis:    PT Problem List:   PT Treatment Interventions:       PT Goals(Current goals can be found in the care plan section) Acute Rehab PT Goals PT Goal Formulation: No goals set, d/c therapy  Visit Information  Last PT Received On: 09/19/13 Assistance Needed: +1       Prior [place] expects to be discharged to:: Private residence Living Arrangements: Spouse/significant other Type of Home: House Home Access: Stairs to enter Technical brewer of Steps: 2 and 2 Entrance Stairs-Rails: None Home Layout: One level Home Equipment: Environmental consultant - 2 wheels Prior Function Level of Independence: Independent Communication Communication: No difficulties    Cognition  Cognition Arousal/Alertness: Awake/alert Behavior During Therapy: WFL for tasks assessed/performed Overall Cognitive Status: Within Functional Limits for tasks assessed    Extremity/Trunk Assessment Lower Extremity Assessment Lower Extremity Assessment: Overall WFL for tasks assessed   Balance    End of Session PT -  End of Session Activity Tolerance: Patient tolerated treatment well Patient left: in chair;with call bell/phone within reach  GP Functional Assessment Tool Used: clincal  judgement Functional Limitation: Mobility: Walking and moving around Mobility: Walking and Moving Around Current Status ([id number]): At least 1 percent but less than 20 percent impaired, limited or restricted Mobility: Walking and Moving Around Goal Status [phone]): At least 1 percent but less than 20 percent impaired, limited or restricted Mobility: Walking and Moving Around Discharge Status [phone]): At least 1 percent but less than 20 percent impaired, limited or restricted   Kaiesha Tonner,KATHrine E 09/19/2013, 9:43 AM Carmelia Bake, PT, DPT 09/19/2013 Pager: [phone]

## 2013-09-19 NOTE — Evaluation (Signed)
Occupational Therapy Evaluation Patient Details Name: Robert [name] MRN: [id number] DOB: [birth year] Today's Date: 09/19/2013 Time: 1194-1740 OT Time Calculation (min): 28 min  OT Assessment / Plan / Recommendation History of present illness Patient s/p central decompressive lumbar laminectomy at L4-5 for spinal stenosis.     Clinical Impression   Pt overall at supervision/min assist level. Has assist by wife at d/c. No further OT needs. All education completed.    OT Assessment  Patient does not need any further OT services    Follow Up Recommendations  No OT follow up;Supervision - Intermittent    Barriers to Discharge      Equipment Recommendations  None recommended by OT    Recommendations for Other Services    Frequency       Precautions / Restrictions Precautions Precautions: Back Precaution Booklet Issued: Yes (comment) Precaution Comments: Issued back care handout and reviewed all info with pt. Pt able to state 2/3 precautions   Pertinent Vitals/Pain 2/10 back; reposition, rest    ADL  Eating/Feeding: Independent Where Assessed - Eating/Feeding: Chair Grooming: Wash/dry hands;Supervision/safety Where Assessed - Grooming: Unsupported standing Upper Body Bathing: Chest;Right arm;Left arm;Abdomen;Supervision/safety Where Assessed - Upper Body Bathing: Unsupported standing Lower Body Bathing: Supervision/safety Where Assessed - Lower Body Bathing: Supported sit to stand Upper Body Dressing: Minimal assistance Where Assessed - Upper Body Dressing: Unsupported standing Lower Body Dressing: Supervision/safety Where Assessed - Lower Body Dressing: Supported sit to stand Toilet Transfer: Water engineer: Comfort height toilet;Grab bars Toileting - Water quality scientist and Hygiene: Supervision/safety Where Assessed - Best boy and Hygiene: Sit on 3-in-1 or toilet Tub/Shower Transfer: Min guard Tub/Shower Transfer Method:   (side step into and out of shower) Equipment Used: Long-handled sponge ADL Comments: Educated pt on LHS option for helping with LB washing in shower. He plans to shower standing. He is able to cross his LEs up to don LB clothing however. Educated on where to obtain LHS. wife can assist PRN at home also. He has a comfort height commode and vanity next to at home. Issued back care handout and reviewed all information with pt. Needs occassional verbal cues for back precautions with clothing retrieval. Min assist for managing button down shirt to help bring it around from the back and pt states wife can help with this at home.     OT Diagnosis:    OT Problem List:   OT Treatment Interventions:     OT Goals(Current goals can be found in the care plan section) Acute Rehab OT Goals Patient Stated Goal: home  Visit Information  Last OT Received On: 09/19/13 Assistance Needed: +1 History of Present Illness: Patient s/p central decompressive lumbar laminectomy at L4-5 for spinal stenosis.         Prior [place] expects to be discharged to:: Private residence Living Arrangements: Spouse/significant other Type of Home: House Home Access: Stairs to enter [employer] of Steps: 2 and 2 Entrance Stairs-Rails: None Home Layout: One level Home Equipment: Environmental consultant - 2 wheels Prior Function Level of Independence: Independent Communication Communication: No difficulties         Vision/Perception     Cognition  Cognition Arousal/Alertness: Awake/alert Behavior During Therapy: WFL for tasks assessed/performed Overall Cognitive Status: Within Functional Limits for tasks assessed    Extremity/Trunk Assessment Upper Extremity Assessment Upper Extremity Assessment: Overall WFL for tasks assessed Lower Extremity Assessment Lower Extremity Assessment: Overall WFL for tasks assessed  Mobility  Transfers Transfers: Sit to Stand;Stand to Sit Sit  to Stand: 5: Supervision;With upper extremity assist;From chair/3-in-1;From toilet Stand to Sit: 5: Supervision;With upper extremity assist;To toilet;To chair/3-in-1 Details for Transfer Assistance: verbal cues for straight back     Exercise     Balance Balance Balance Assessed: Yes Dynamic Standing Balance Dynamic Standing - Level of Assistance: 5: Stand by assistance   End of Session OT - End of Session Equipment Utilized During Treatment: Rolling walker Activity Tolerance: Patient tolerated treatment well Patient left: in chair;with call bell/phone within reach  GO Functional Assessment Tool Used: clinical judgement Functional Limitation: Self care Self Care Current Status (R8309): At least 1 percent but less than 20 percent impaired, limited or restricted Self Care Goal Status (M0768): At least 1 percent but less than 20 percent impaired, limited or restricted Self Care Discharge Status [id number]): At least 1 percent but less than 20 percent impaired, limited or restricted   Robert [name] [phone] 09/19/2013, 11:24 AM

## 2013-09-19 NOTE — Progress Notes (Signed)
   CARE MANAGEMENT NOTE 09/19/2013  Patient:  BABE, CLENNEY   Account Number:  [id number]  Date Initiated:  09/19/2013  Documentation initiated by:  [facility]  Subjective/Objective Assessment:   Central decompressive lumbar laminectomy at L4-5 for spinal  stenosis.     Action/Plan:   No HH or DME needed at dc.   Anticipated DC Date:  09/19/2013   Anticipated DC Plan:  [place]  CM consult      Choice offered to / List presented to:             Status of service:  Completed, signed off Medicare Important Message given?   (If response is "NO", the following Medicare IM given date fields will be blank) Date Medicare IM given:   Date Additional Medicare IM given:    Discharge Disposition:  HOME/SELF CARE  Per UR Regulation:    If discussed at Long Length of Stay Meetings, dates discussed:    Comments:

## 2013-09-20 ENCOUNTER — Encounter (HOSPITAL_COMMUNITY): Payer: Self-pay | Admitting: Orthopedic Surgery

## 2013-09-24 NOTE — Discharge Summary (Signed)
Physician Discharge Summary   Patient ID: DEJOUR VOS MRN: [id number] DOB/AGE: Dec 01, 1945 68 y.o.  Admit date: 09/18/2013 Discharge date: 09/19/2013  Primary Diagnosis:  Lumbar disc herniation and synovial cyst Admission Diagnoses:  Past Medical History  Diagnosis Date  . ALLERGIC RHINITIS 08/20/2007  . BENIGN PROSTATIC HYPERTROPHY 08/20/2007  . CAD (coronary artery disease)     a. s/p CABG x 3 in 1989;  b. MI in 1995 with BMS to VG->RCA;  c. 07/2000 PCI/BMS to native LCX;  d. Known CTO of VG->Diag;  e. Mult caths w/ stable anatomy;  f. 04/2013 Cath: LM nl, LAD 100, LCX 10%isr, 95m OM1/2/3 min irregs, RI min irregs, RCA 80-90p/1060mVG->RCA 20-30isr, LIMA->LAD ok, EF 60%-->Med Rx.  . ELBOW PAIN, LEFT 04/15/2008  . ERECTILE DYSFUNCTION 08/20/2007  . HYPERLIPIDEMIA TYPE I / IV 09/03/2008  . HYPERTENSION 08/20/2007  . INSOMNIA-SLEEP DISORDER-UNSPEC 03/17/2008  . PREMATURE VENTRICULAR CONTRACTIONS 08/20/2007  . SCROTAL MASS 10/09/2008  . URINARY INCONTINENCE, MALE 10/09/2008  . UTI 04/24/2009    "just this once" (05/15/2013)  . History of meningitis     2nd grade, 8th grade, 9th grade, age 68. Microcytosis   . OSA on CPAP   . Chronic lower back pain   . Anginal pain May 13 2013    cath done=everything ok  . Myocardial infarction June 1995  . Asthma     "very slight"   . GERD (gastroesophageal reflux disease)   . Arthritis     "back; knees; shoulders"    Discharge Diagnoses:   Active Problems:   Spinal stenosis, lumbar region, with neurogenic claudication  Estimated body mass index is 34.31 kg/(m^2) as calculated from the following:   Height as of this encounter: 6' (1.829 m).   Weight as of this encounter: 114.76 kg (253 lb).  Procedure:  Procedure(s) (LRB): CENTRAL DECOMPRESSION @ L4-5 FOR SPINAL STENOSIS. EXCISION OF SYNOVIAL CYST @ L4-5 RIGHT, MICRODISCECTOMY @L4 -5 RIGHT  (Right)   Consults: None  HPI: Robert [name] [id number] old male [place] in [place] back pain. He did not recall a specific injury but has increased pain after playing golf. He reported pain radiating down the posterior aspect of both legs, right greater than left. He denied numbness and tingling as well as change in bladder or bowel function. MRI revealed he has a large herniated disk at L4-5 on the right. He has a synovial facet cyst L4-5 on the right.   Laboratory Data: Hospital Outpatient Visit on 09/10/2013  Component Date Value Range Status  . WBC 09/10/2013 9.2  4.0 - 10.5 K/uL Final  . RBC 09/10/2013 5.88* 4.22 - 5.81 MIL/uL Final  . Hemoglobin 09/10/2013 15.0  13.0 - 17.0 g/dL Final  . HCT 09/10/2013 46.0  39.0 - 52.0 % Final  . MCV 09/10/2013 78.2  78.0 - 100.0 fL Final  . MCH 09/10/2013 25.5* 26.0 - 34.0 pg Final  . MCHC 09/10/2013 32.6  30.0 - 36.0 g/dL Final  . RDW 09/10/2013 14.5  11.5 - 15.5 % Final  . Platelets 09/10/2013 205  150 - 400 K/uL Final  . MRSA, PCR 09/10/2013 NEGATIVE  NEGATIVE Final  . Staphylococcus aureus 09/10/2013 NEGATIVE  NEGATIVE Final   Comment:                                 The Xpert SA  Assay (FDA                          approved for NASAL specimens                          in patients over 49 years of age),                          is one component of                          a comprehensive surveillance                          program.  Test performance has                          been validated by [employer] for patients greater                          than or equal to 51 year old.                          It is not intended                          to diagnose infection nor to                          guide or monitor treatment.  Marland Kitchen aPTT 09/10/2013 30  24 - 37 seconds Final  . Sodium 09/10/2013 138  135 - 145 mEq/L Final  . Potassium 09/10/2013 3.9  3.5 - 5.1 mEq/L Final  . Chloride 09/10/2013 103  96 - 112 mEq/L Final  . CO2 09/10/2013 24  19 - 32  mEq/L Final  . Glucose, Bld 09/10/2013 91  70 - 99 mg/dL Final  . BUN 09/10/2013 20  6 - 23 mg/dL Final  . Creatinine, Ser 09/10/2013 0.74  0.50 - 1.35 mg/dL Final  . Calcium 09/10/2013 8.8  8.4 - 10.5 mg/dL Final  . Total Protein 09/10/2013 6.9  6.0 - 8.3 g/dL Final  . Albumin 09/10/2013 3.8  3.5 - 5.2 g/dL Final  . AST 09/10/2013 27  0 - 37 U/L Final  . ALT 09/10/2013 32  0 - 53 U/L Final  . Alkaline Phosphatase 09/10/2013 78  39 - 117 U/L Final  . Total Bilirubin 09/10/2013 0.3  0.3 - 1.2 mg/dL Final  . GFR calc non Af Amer 09/10/2013 >90  >90 mL/min Final  . GFR calc Af Amer 09/10/2013 >90  >90 mL/min Final   Comment: (NOTE)                          The eGFR has been calculated using the CKD EPI equation.                          This calculation has not been validated in all clinical situations.  eGFR's persistently <90 mL/min signify possible Chronic Kidney                          Disease.  Marland Kitchen Prothrombin Time 09/10/2013 12.8  11.6 - 15.2 seconds Final  . INR 09/10/2013 0.98  0.00 - 1.49 Final  . Color, Urine 09/10/2013 Kelson Queenan* YELLOW Final   BIOCHEMICALS MAY BE AFFECTED BY COLOR  . APPearance 09/10/2013 CLEAR  CLEAR Final  . Specific Gravity, Urine 09/10/2013 1.022  1.005 - 1.030 Final  . pH 09/10/2013 5.5  5.0 - 8.0 Final  . Glucose, UA 09/10/2013 NEGATIVE  NEGATIVE mg/dL Final  . Hgb urine dipstick 09/10/2013 NEGATIVE  NEGATIVE Final  . Bilirubin Urine 09/10/2013 SMALL* NEGATIVE Final  . Ketones, ur 09/10/2013 NEGATIVE  NEGATIVE mg/dL Final  . Protein, ur 09/10/2013 NEGATIVE  NEGATIVE mg/dL Final  . Urobilinogen, UA 09/10/2013 1.0  0.0 - 1.0 mg/dL Final  . Nitrite 09/10/2013 NEGATIVE  NEGATIVE Final  . Leukocytes, UA 09/10/2013 SMALL* NEGATIVE Final  . Squamous Epithelial / LPF 09/10/2013 RARE  RARE Final  . WBC, UA 09/10/2013 3-6  <3 WBC/hpf Final  . RBC / HPF 09/10/2013 0-2  <3 RBC/hpf Final  . Bacteria, UA 09/10/2013 FEW* RARE Final  . Casts  09/10/2013 HYALINE CASTS* NEGATIVE Final  . Urine-Other 09/10/2013 MUCOUS PRESENT   Final  . Specimen Description 09/10/2013 URINE, RANDOM   Final  . Special Requests 09/10/2013 NONE   Final  . Culture  Setup Time 09/10/2013    Final                   Value:09/10/2013 16:17                         Performed at [facility]  . Colony Count 09/10/2013    Final                   Value:NO GROWTH                         Performed at [facility]  . Culture 09/10/2013    Final                   Value:NO GROWTH                         Performed at [facility]  . Report Status 09/10/2013 09/11/2013 FINAL   Final     X-Rays:Dg Lumbar Spine 2-3 Views  09/10/2013   CLINICAL DATA:  Preop evaluation  EXAM: LUMBAR SPINE - 2-3 VIEW  COMPARISON:  None.  FINDINGS: There is no evidence of fracture nor dislocation. Areas of endplate hypertrophic spurring and endplate sclerosis particularly at the L3-4 L4-5 levels. Mild disc space narrowing is appreciated L2-3 and L3-4. Areas of facet sclerosis are identified at the L2-3, L3-4, L4-5, and L5-S1 levels.  IMPRESSION: No evidence of acute osseous abnormalities. Areas of spondylolysis as described above.   Electronically Signed   By: Margaree Mackintosh M.D.   On: 09/10/2013 09:25   Dg Lumbar Spine 1 View  09/18/2013   CLINICAL DATA:  Surgical level L4-5.  EXAM: LUMBAR SPINE - 1 VIEW  COMPARISON:  Two view lumbar spine 09/10/2013.  FINDINGS: The lumbar spine is labeled. To needles are in place. The lower needle is directed at the L4-5 disc space.  The upper needle is directed at the L4 spinous process.  IMPRESSION: 1. Intraoperative localization of the L4-5 disc space.   Electronically Signed   By: Lawrence Santiago M.D.   On: 09/18/2013 08:06   Dg Spine Portable 1 View  09/18/2013   CLINICAL DATA:  Intraoperative localization of L4-L5  EXAM: PORTABLE SPINE - 1 VIEW  COMPARISON:  Intraoperative spinal localization radiographs-earlier same day;  lumbar spine radiographs -09/10/2013  FINDINGS: A single lateral spot intraoperative radiographic image of the lower lumbar spine is provided for review. Lumbar spinal labeling is in keeping with intraoperative spinal radiographs performed earlier same day today as well as preprocedural lumbar spine radiographs.  There is a radiopaque marking instrument tip overlying the soft tissues about the caudal aspect of the L4-L5 intervertebral disc space. Additional radiopaque surgical support apparatus is seen posterior to the operative site.  IMPRESSION: Intraoperative localization of L4-L5 as above.   Electronically Signed   By: Sandi Mariscal M.D.   On: 09/18/2013 08:42   Dg Spine Portable 1 View  09/18/2013   CLINICAL DATA:  Intraoperative location L4-5  EXAM: PORTABLE SPINE - 1 VIEW  COMPARISON:  Comparison view from earlier in the same day.  FINDINGS: The numbering nomenclature similar to that used on previous exams. Surgical instruments are now noted at the level of the posterior elements at the L4-5 interspace.   Electronically Signed   By: Inez Catalina M.D.   On: 09/18/2013 08:11    EKG: Orders placed in visit on 06/18/13  . EKG 12-LEAD     Hospital Course: MIRL HILLERY is a 68 y.o. who was admitted to [facility]. They were brought to the operating room on 09/18/2013 and underwent Procedure(s): CENTRAL DECOMPRESSION @ L4-5 FOR SPINAL STENOSIS. EXCISION OF SYNOVIAL CYST @ L4-5 RIGHT, MICRODISCECTOMY @L4 -5 RIGHT .  Patient tolerated the procedure well and was later transferred to the recovery room and then to the orthopaedic floor for postoperative care.  They were given PO and IV analgesics for pain control following their surgery.  They were given 24 hours of postoperative antibiotics of  Anti-infectives   Start     Dose/Rate Route Frequency Ordered Stop   09/18/13 1400  ceFAZolin (ANCEF) IVPB 1 g/50 mL premix     1 g 100 mL/hr over 30 Minutes Intravenous 3 times per day 09/18/13 1011  09/19/13 0607   09/18/13 0841  polymyxin B 500,000 Units, bacitracin 50,000 Units in sodium chloride irrigation 0.9 % 500 mL irrigation  Status:  Discontinued       As needed 09/18/13 0841 09/18/13 0914   09/18/13 0530  ceFAZolin (ANCEF) IVPB 2 g/50 mL premix     2 g 100 mL/hr over 30 Minutes Intravenous On call to O.R. 09/18/13 0530 09/18/13 0725     and started on DVT prophylaxis in the form of Plavix (resumed after surgery).   PT was ordered for gait training. Discharge planning consulted to help with postop disposition and equipment needs.  Patient had a fair night on the evening of surgery.  They started to get up OOB with therapy on day one.   Incision was healing well.  Patient was seen in rounds and was ready to go home.   Discharge Medications: Prior to Admission medications   Medication Sig Start Date End Date Taking? Authorizing Provider  albuterol (PROAIR HFA) 108 (90 BASE) MCG/ACT inhaler Inhale 2 puffs into the lungs every 6 (six) hours as needed for wheezing or shortness  of breath.    Yes Historical Provider, MD  amitriptyline (ELAVIL) 25 MG tablet Take 25 mg by mouth at bedtime.    Yes Historical Provider, MD  aspirin 81 MG tablet Take 81 mg by mouth daily.    Yes Historical Provider, MD  atenolol (TENORMIN) 25 MG tablet Take 25 mg by mouth at bedtime. 06/18/13  Yes Sherren Mocha, MD  clopidogrel (PLAVIX) 75 MG tablet Take 75 mg by mouth daily.    Yes Historical Provider, MD  isosorbide mononitrate (IMDUR) 30 MG 24 hr tablet Take 30 mg by mouth daily. 05/16/13  Yes Rogelia Mire, NP  lisinopril (PRINIVIL,ZESTRIL) 10 MG tablet Take 10 mg by mouth at bedtime.   Yes Historical Provider, MD  Multiple Vitamin (MULTIVITAMIN WITH MINERALS) TABS tablet Take 1 tablet by mouth every morning.   Yes Historical Provider, MD  nitroGLYCERIN (NITROSTAT) 0.4 MG SL tablet Place 0.4 mg under the tongue every 5 (five) minutes as needed for chest pain. 05/16/13  Yes Rogelia Mire, NP    omeprazole (PRILOSEC) 20 MG capsule Take 20 mg by mouth daily. 06/18/13  Yes Sherren Mocha, MD  simvastatin (ZOCOR) 80 MG tablet Take 40 mg by mouth at bedtime.    Yes Historical Provider, MD  sulindac (CLINORIL) 200 MG tablet Take 200 mg by mouth 2 (two) times daily.    Yes Historical Provider, MD  temazepam (RESTORIL) 30 MG capsule Take 30 mg by mouth at bedtime as needed for sleep.    Yes Historical Provider, MD  HYDROmorphone (DILAUDID) 2 MG tablet Take 1 tablet (2 mg total) by mouth every 3 (three) hours as needed for severe pain (Q4-6 hours PRN). 09/19/13   Lucille Passy Babish, PA-C  methocarbamol (ROBAXIN) 500 MG tablet Take 1 tablet (500 mg total) by mouth every 6 (six) hours as needed for muscle spasms. 09/19/13   Pricilla Loveless, PA-C    Diet: Cardiac diet Activity:WBAT Follow-up:in 2 weeks Disposition - Home Discharged Condition: stable   Discharge Orders   Future Appointments Provider Department Dept Phone   12/10/2013 3:30 PM Sherren Mocha, MD [place] Office [phone]   Future Orders Complete By Expires   Call MD / Call 911  As directed    Comments:     If you experience chest pain or shortness of breath, CALL 911 and be transported to the hospital emergency room.  If you develope a fever above 101 F, pus (white drainage) or increased drainage or redness at the wound, or calf pain, call your surgeon's office.   Constipation Prevention  As directed    Comments:     Drink plenty of fluids.  Prune juice may be helpful.  You may use a stool softener, such as Colace (over the counter) 100 mg twice a day.  Use MiraLax (over the counter) for constipation as needed.   Diet - low sodium heart healthy  As directed    Discharge instructions  As directed    Comments:     For the first few days, remove your dressing, tape a piece of saran wrap over your incision, take your shower, then remove the saran wrap and put a clean dressing on. After two days you can  shower without the saran wrap.  Change your dressing daily. Shower only, no tub bath. Call if any temperatures greater than 101 or any wound complications: [phone] during the day and ask for Dr. Charlestine Night nurse, Brunilda Payor.   Driving restrictions  As directed  Comments:     No driving for 1 week   Increase activity slowly as tolerated  As directed    Lifting restrictions  As directed    Comments:     No lifting       Medication List         amitriptyline 25 MG tablet  Commonly known as:  ELAVIL  Take 25 mg by mouth at bedtime.     aspirin 81 MG tablet  Take 81 mg by mouth daily.     atenolol 25 MG tablet  Commonly known as:  TENORMIN  Take 25 mg by mouth at bedtime.     clopidogrel 75 MG tablet  Commonly known as:  PLAVIX  Take 75 mg by mouth daily.     HYDROmorphone 2 MG tablet  Commonly known as:  DILAUDID  Take 1 tablet (2 mg total) by mouth every 3 (three) hours as needed for severe pain (Q4-6 hours PRN).     isosorbide mononitrate 30 MG 24 hr tablet  Commonly known as:  IMDUR  Take 30 mg by mouth daily.     lisinopril 10 MG tablet  Commonly known as:  PRINIVIL,ZESTRIL  Take 10 mg by mouth at bedtime.     methocarbamol 500 MG tablet  Commonly known as:  ROBAXIN  Take 1 tablet (500 mg total) by mouth every 6 (six) hours as needed for muscle spasms.     multivitamin with minerals Tabs tablet  Take 1 tablet by mouth every morning.     nitroGLYCERIN 0.4 MG SL tablet  Commonly known as:  NITROSTAT  Place 0.4 mg under the tongue every 5 (five) minutes as needed for chest pain.     omeprazole 20 MG capsule  Commonly known as:  PRILOSEC  Take 20 mg by mouth daily.     PROAIR HFA 108 (90 BASE) MCG/ACT inhaler  Generic drug:  albuterol  Inhale 2 puffs into the lungs every 6 (six) hours as needed for wheezing or shortness of breath.     simvastatin 80 MG tablet  Commonly known as:  ZOCOR  Take 40 mg by mouth at bedtime.     sulindac 200 MG tablet   Commonly known as:  CLINORIL  Take 200 mg by mouth 2 (two) times daily.     temazepam 30 MG capsule  Commonly known as:  RESTORIL  Take 30 mg by mouth at bedtime as needed for sleep.           Follow-up Information   Follow up with GIOFFRE,RONALD A, MD. Schedule an appointment as soon as possible for a visit in 2 weeks.   Specialty:  Orthopedic Surgery   Contact information:   [address] [place] [address] [phone]       Signed: Ardeen Jourdain [facility] 09/24/2013, 8:42 AM

## 2013-12-10 ENCOUNTER — Encounter: Payer: Self-pay | Admitting: Cardiovascular Disease

## 2013-12-10 ENCOUNTER — Ambulatory Visit (INDEPENDENT_AMBULATORY_CARE_PROVIDER_SITE_OTHER): Payer: Medicare Other | Admitting: Cardiovascular Disease

## 2013-12-10 VITALS — BP 134/72 | HR 67 | Ht 72.0 in | Wt 249.0 lb

## 2013-12-10 DIAGNOSIS — I200 Unstable angina: Secondary | ICD-10-CM

## 2013-12-10 DIAGNOSIS — I25810 Atherosclerosis of coronary artery bypass graft(s) without angina pectoris: Secondary | ICD-10-CM

## 2013-12-10 DIAGNOSIS — I10 Essential (primary) hypertension: Secondary | ICD-10-CM

## 2013-12-10 DIAGNOSIS — E785 Hyperlipidemia, unspecified: Secondary | ICD-10-CM

## 2013-12-10 MED ORDER — ISOSORBIDE MONONITRATE ER 30 MG PO TB24: 30.0000 mg | ORAL_TABLET | Freq: Every day | ORAL | Status: DC

## 2013-12-10 NOTE — Patient Instructions (Signed)
Your physician recommends that you continue on your current medications as directed. Please refer to the Current Medication list given to you today.  Your physician wants you to follow-up in: 1 year with Dr. Cooper.  You will receive a reminder letter in the mail two months in advance. If you don't receive a letter, please call our office to schedule the follow-up appointment.   

## 2013-12-12 ENCOUNTER — Encounter: Payer: Self-pay | Admitting: Cardiovascular Disease

## 2013-12-12 NOTE — Progress Notes (Signed)
HPI:  68 year old gentleman presents for followup evaluation. The patient has CAD status post remote CABG in 1989. He has undergone multiple PCI procedures. He  underwent cardiac catheterization in 2014 demonstrating stable coronary anatomy. He had continued patency of the LIMA to LAD and saphenous vein graft to RCA. His native left circumflex had a patent stent. LV function was normal with an ejection fraction estimated at 60%. Lipids from 05/15/2013 showed a cholesterol of 154, triglycerides 76, HDL 48, and LDL 91.  Since his last visit September 2014, he underwent back surgery. He is pleased with the outcome. Able to golf again. No chest pain, dyspnea, or edema. No palpitations, lightheadedness, or syncope. Overall doing well without cardiac-related complaints.   Outpatient Encounter Prescriptions as of 12/10/2013  Medication Sig  . albuterol (PROAIR HFA) 108 (90 BASE) MCG/ACT inhaler Inhale 2 puffs into the lungs every 6 (six) hours as needed for wheezing or shortness of breath.   Marland Kitchen amitriptyline (ELAVIL) 25 MG tablet Take 25 mg by mouth at bedtime.   Marland Kitchen aspirin 81 MG tablet Take 81 mg by mouth daily.   Marland Kitchen atenolol (TENORMIN) 25 MG tablet Take 25 mg by mouth at bedtime.  . clopidogrel (PLAVIX) 75 MG tablet Take 75 mg by mouth daily.   . isosorbide mononitrate (IMDUR) 30 MG 24 hr tablet Take 1 tablet (30 mg total) by mouth daily.  Marland Kitchen lisinopril (PRINIVIL,ZESTRIL) 10 MG tablet Take 10 mg by mouth at bedtime.  . Multiple Vitamin (MULTIVITAMIN WITH MINERALS) TABS tablet Take 1 tablet by mouth every morning.  . nitroGLYCERIN (NITROSTAT) 0.4 MG SL tablet Place 0.4 mg under the tongue every 5 (five) minutes as needed for chest pain.  Marland Kitchen omeprazole (PRILOSEC) 20 MG capsule Take 20 mg by mouth daily.  . simvastatin (ZOCOR) 80 MG tablet Take 40 mg by mouth at bedtime.   . sulindac (CLINORIL) 200 MG tablet Take 200 mg by mouth 2 (two) times daily.   . temazepam (RESTORIL) 30 MG capsule Take 30 mg by  mouth at bedtime as needed for sleep.   . [DISCONTINUED] HYDROmorphone (DILAUDID) 2 MG tablet Take 1 tablet (2 mg total) by mouth every 3 (three) hours as needed for severe pain (Q4-6 hours PRN).  . [DISCONTINUED] isosorbide mononitrate (IMDUR) 30 MG 24 hr tablet Take 30 mg by mouth daily.  . [DISCONTINUED] methocarbamol (ROBAXIN) 500 MG tablet Take 1 tablet (500 mg total) by mouth every 6 (six) hours as needed for muscle spasms.    Allergies  Allergen Reactions  . Metoprolol     REACTION: severe bradycardia    Past Medical History  Diagnosis Date  . ALLERGIC RHINITIS 08/20/2007  . BENIGN PROSTATIC HYPERTROPHY 08/20/2007  . CAD (coronary artery disease)     a. s/p CABG x 3 in 1989;  b. MI in 1995 with BMS to VG->RCA;  c. 07/2000 PCI/BMS to native LCX;  d. Known CTO of VG->Diag;  e. Mult caths w/ stable anatomy;  f. 04/2013 Cath: LM nl, LAD 100, LCX 10%isr, 34m, OM1/2/3 min irregs, RI min irregs, RCA 80-90p/164m, VG->RCA 20-30isr, LIMA->LAD ok, EF 60%-->Med Rx.  . ELBOW PAIN, LEFT 04/15/2008  . ERECTILE DYSFUNCTION 08/20/2007  . HYPERLIPIDEMIA TYPE I / IV 09/03/2008  . HYPERTENSION 08/20/2007  . INSOMNIA-SLEEP DISORDER-UNSPEC 03/17/2008  . PREMATURE VENTRICULAR CONTRACTIONS 08/20/2007  . SCROTAL MASS 10/09/2008  . URINARY INCONTINENCE, MALE 10/09/2008  . UTI 04/24/2009    "just this once" (05/15/2013)  . History of meningitis  2nd grade, 8th grade, 9th grade, age 65  . Microcytosis   . OSA on CPAP   . Chronic lower back pain   . Anginal pain May 13 2013    cath done=everything ok  . Myocardial infarction June 1995  . Asthma     "very slight"   . GERD (gastroesophageal reflux disease)   . Arthritis     "back; knees; shoulders"     ROS: Negative except as per HPI  BP 134/72  Pulse 67  Ht 6' (1.829 m)  Wt 112.946 kg (249 lb)  BMI 33.76 kg/m2  PHYSICAL EXAM: Pt is alert and oriented, NAD HEENT: normal Neck: JVP - normal, carotids 2+= without bruits Lungs: CTA  bilaterally CV: RRR without murmur or gallop Abd: soft, NT, Positive BS, no hepatomegaly Ext: no C/C/E, distal pulses intact and equal Skin: warm/dry no rash  EKG:  NSR 67 bpm with age-indeterminate inferior MI  ASSESSMENT AND PLAN: 1. CAD, native vessel. Patent bypass grafts at time of last cath. Stable without angina. Continue same Rx which was reviewed with the patient today.  2. Hyperlipidemia. Continue simvastatin. Lipids followed through the [facility] system.  3. HTN. BP well-controlled on atenolol, isosorbide, and lisinopril.  I will see him back in one year.  Sherren Mocha 12/12/2013 12:23 AM

## 2014-05-14 ENCOUNTER — Encounter: Payer: Self-pay | Admitting: Internal Medicine

## 2014-07-18 ENCOUNTER — Telehealth: Payer: Self-pay | Admitting: Cardiovascular Disease

## 2014-07-18 NOTE — Telephone Encounter (Signed)
Received request from Nurse fax box, documents faxed for surgical clearance. To: [facility] Fax number: [phone] Attention: 10.30.15/km

## 2014-08-11 ENCOUNTER — Telehealth: Payer: Self-pay | Admitting: Internal Medicine

## 2014-08-11 NOTE — Telephone Encounter (Signed)
Pt request to reestablish with Dr. Jenny Reichmann, last ov was 04/2011. 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 Please advise. Please send massage back to [employer].

## 2014-08-11 NOTE — Telephone Encounter (Signed)
Ok with me for next available 

## 2014-08-12 NOTE — Telephone Encounter (Signed)
Patient thinks rash is getting better. He is on his way out of town.  He made an appt to reestablish the end of December.  I told him to call back to schedule an acute visit if the rash gets worse.

## 2014-08-27 IMAGING — CR DG LUMBAR SPINE 2-3V
2 series · 2 of 2 positions shown · non-contrast
Comparison: None.

CLINICAL DATA: Preop evaluation

EXAM:
LUMBAR SPINE - 2-3 VIEW

[t l-spine a.p.]
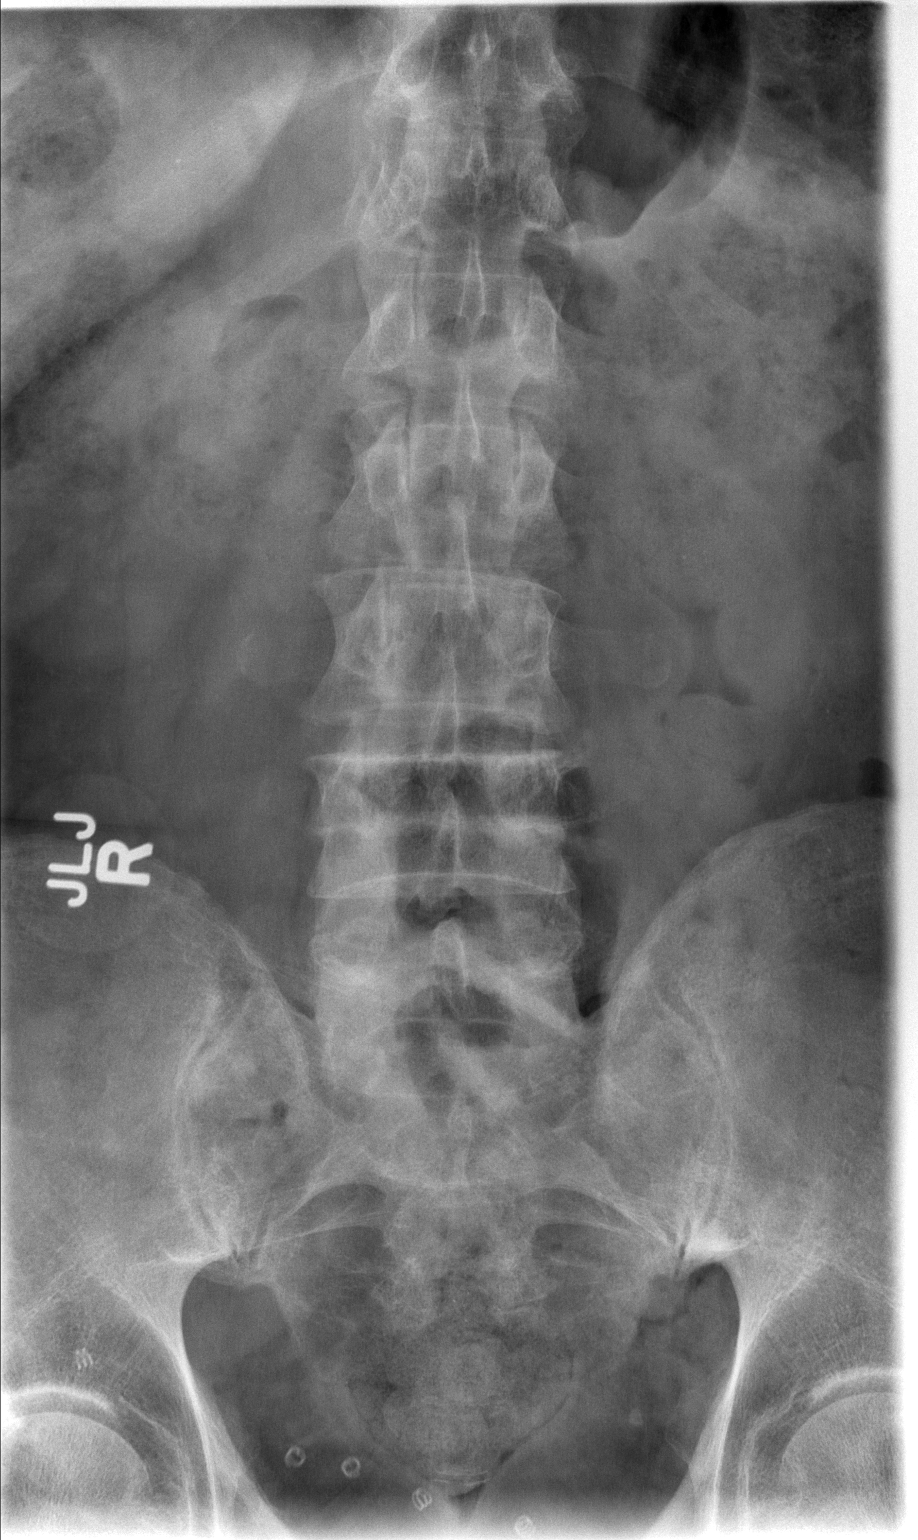

[t l-spine lat]
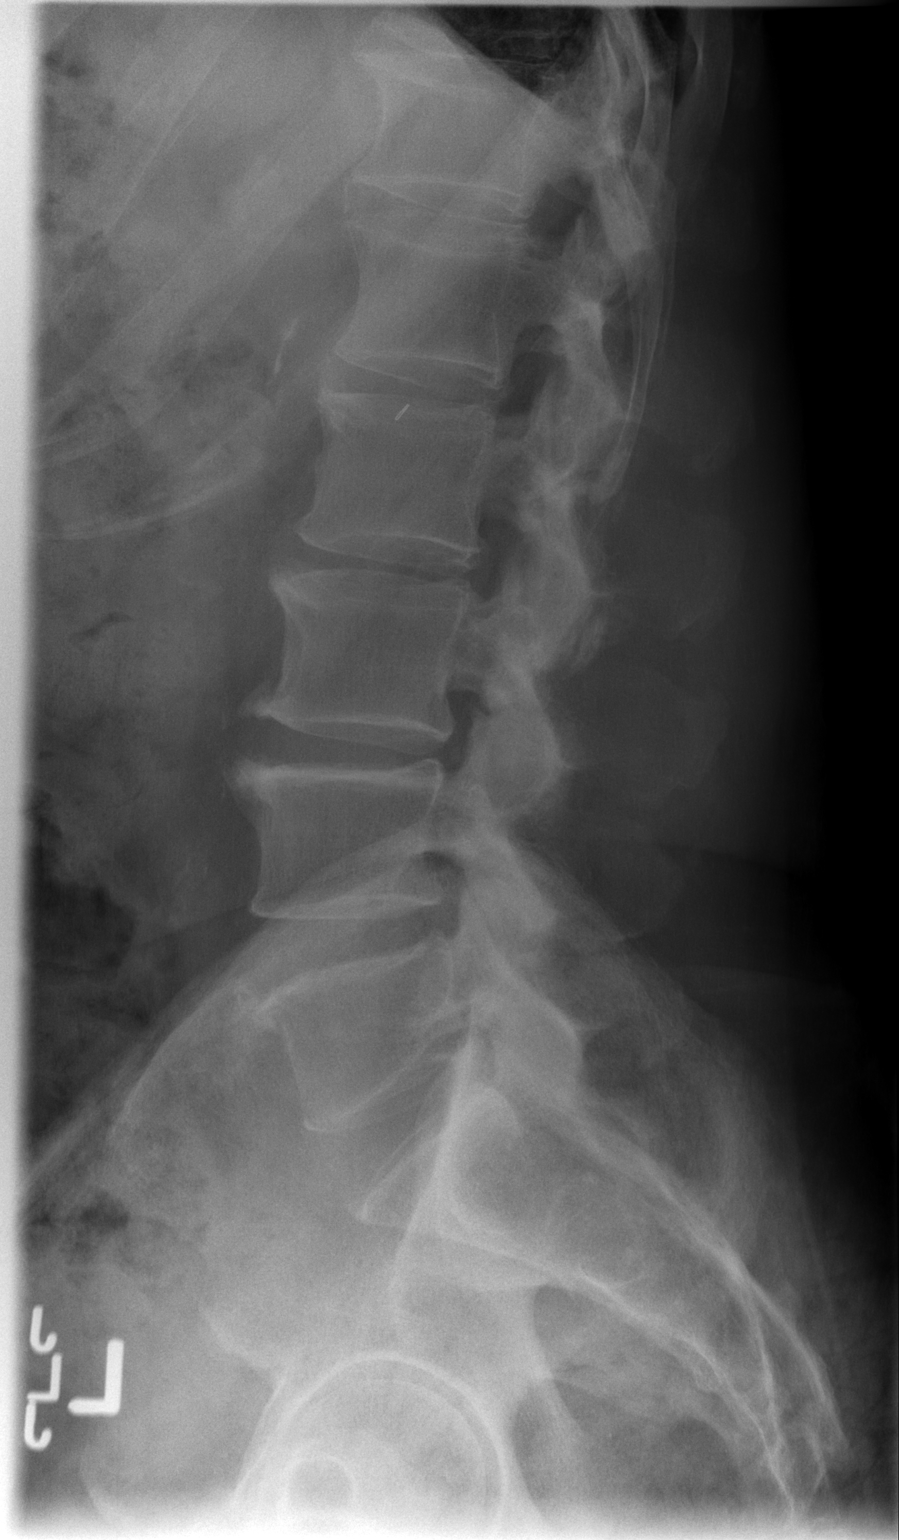

[2 of 2 positions shown; findings below may reference images not displayed]

FINDINGS: There is no evidence of fracture nor dislocation. Areas of endplate
hypertrophic spurring and endplate sclerosis particularly at the
L3-4 L4-5 levels. Mild disc space narrowing is appreciated L2-3 and
L3-4. Areas of facet sclerosis are identified at the L2-3, L3-4,
L4-5, and L5-S1 levels.
IMPRESSION: No evidence of acute osseous abnormalities. Areas of spondylolysis
as described above.

## 2014-08-28 ENCOUNTER — Encounter (HOSPITAL_COMMUNITY): Payer: Self-pay | Admitting: Cardiovascular Disease

## 2014-09-17 ENCOUNTER — Ambulatory Visit: Payer: Medicare Other | Admitting: Internal Medicine

## 2014-09-24 ENCOUNTER — Encounter: Payer: Self-pay | Admitting: Internal Medicine

## 2014-09-24 ENCOUNTER — Ambulatory Visit (INDEPENDENT_AMBULATORY_CARE_PROVIDER_SITE_OTHER): Payer: Commercial Managed Care - HMO | Admitting: Internal Medicine

## 2014-09-24 VITALS — BP 122/70 | HR 80 | Temp 98.5°F | Ht 72.0 in | Wt 255.1 lb

## 2014-09-24 DIAGNOSIS — Z0000 Encounter for general adult medical examination without abnormal findings: Secondary | ICD-10-CM | POA: Diagnosis not present

## 2014-09-24 DIAGNOSIS — R21 Rash and other nonspecific skin eruption: Secondary | ICD-10-CM | POA: Insufficient documentation

## 2014-09-24 MED ORDER — LISINOPRIL 10 MG PO TABS: 10.0000 mg | ORAL_TABLET | Freq: Every day | ORAL | Status: DC

## 2014-09-24 MED ORDER — CLOTRIMAZOLE-BETAMETHASONE 1-0.05 % EX CREA: TOPICAL_CREAM | CUTANEOUS | Status: DC

## 2014-09-24 MED ORDER — AMITRIPTYLINE HCL 25 MG PO TABS: 25.0000 mg | ORAL_TABLET | Freq: Every day | ORAL | Status: DC

## 2014-09-24 MED ORDER — CLOPIDOGREL BISULFATE 75 MG PO TABS: 75.0000 mg | ORAL_TABLET | Freq: Every day | ORAL | Status: DC

## 2014-09-24 MED ORDER — ATENOLOL 25 MG PO TABS: 25.0000 mg | ORAL_TABLET | Freq: Every day | ORAL | Status: DC

## 2014-09-24 MED ORDER — SIMVASTATIN 80 MG PO TABS: 40.0000 mg | ORAL_TABLET | Freq: Every day | ORAL | Status: DC

## 2014-09-24 NOTE — Progress Notes (Signed)
Pre visit review using our clinic review tool, if applicable. No additional management support is needed unless otherwise documented below in the visit note. 

## 2014-09-24 NOTE — Patient Instructions (Addendum)
Please take all new medication as prescribed - the cream  You will be contacted regarding the referral for: dermatology  Please continue all other medications as before, and refills have been done if requested, including the amytriptylene  Please have the pharmacy call with any other refills you may need.  Please continue your efforts at being more active, low cholesterol diet, and weight control.  You are otherwise up to date with prevention measures today  Please keep your appointments with your specialists as you may have planned - Dr Burt Knack  Your work is being done at the [facility] in April 2016  Please return in 1 year for your yearly visit, or sooner if needed

## 2014-09-24 NOTE — Assessment & Plan Note (Signed)
Likely contact derm related, for topical steroid prn,  to f/u any worsening symptoms or concerns

## 2014-09-24 NOTE — Assessment & Plan Note (Signed)

## 2014-09-24 NOTE — Progress Notes (Signed)
Subjective:    Patient ID: Robert [name], male    DOB: 1946-03-22, 69 y.o.   MRN: [id number]  HPI  Here for wellness and f/u;  Overall doing ok;  Pt denies CP, worsening SOB, DOE, wheezing, orthopnea, PND, worsening LE edema, palpitations, dizziness or syncope.  Pt denies neurological change such as new headache, facial or extremity weakness.  Pt denies polydipsia, polyuria, or low sugar symptoms. Pt states overall good compliance with treatment and medications, good tolerability, and has been trying to follow lower cholesterol diet.  Pt denies worsening depressive symptoms, suicidal ideation or panic. No fever, night sweats, wt loss, loss of appetite, or other constitutional symptoms.  Pt states good ability with ADL's, has low fall risk, home safety reviewed and adequate, no other significant changes in hearing or vision, and only occasionally active with exercise. ? Glue on tens units caused skin rash to abd bilat and lower lumbar. S/p back surgury dec 31/2014, doing well Past Medical History  Diagnosis Date  . ALLERGIC RHINITIS 08/20/2007  . BENIGN PROSTATIC HYPERTROPHY 08/20/2007  . CAD (coronary artery disease)     a. s/p CABG x 3 in 1989;  b. MI in 1995 with BMS to VG->RCA;  c. 07/2000 PCI/BMS to native LCX;  d. Known CTO of VG->Diag;  e. Mult caths w/ stable anatomy;  f. 04/2013 Cath: LM nl, LAD 100, LCX 10%isr, 19m, OM1/2/3 min irregs, RI min irregs, RCA 80-90p/157m, VG->RCA 20-30isr, LIMA->LAD ok, EF 60%-->Med Rx.  . ELBOW PAIN, LEFT 04/15/2008  . ERECTILE DYSFUNCTION 08/20/2007  . HYPERLIPIDEMIA TYPE I / IV 09/03/2008  . HYPERTENSION 08/20/2007  . INSOMNIA-SLEEP DISORDER-UNSPEC 03/17/2008  . PREMATURE VENTRICULAR CONTRACTIONS 08/20/2007  . SCROTAL MASS 10/09/2008  . URINARY INCONTINENCE, MALE 10/09/2008  . UTI 04/24/2009    "just this once" (05/15/2013)  . History of meningitis     2nd grade, 8th grade, 9th grade, age 45  . Microcytosis   . OSA on CPAP   . Chronic lower back pain   .  Anginal pain May 13 2013    cath done=everything ok  . Myocardial infarction June 1995  . Asthma     "very slight"   . GERD (gastroesophageal reflux disease)   . Arthritis     "back; knees; shoulders"    Past Surgical History  Procedure Laterality Date  . Transurethral resection of prostate  10/02/2009    Gyrus transurethral resection   . Cystoscopy  08/07/2009    and right hydrocelectomy  . Colonoscopy w/ biopsies and polypectomy  11/20/2003    adenomatous polyp, internal and external hemorrhoids  . Foot tendon surgery Left 2012    "donor tendon"   . Shoulder open rotator cuff repair Right 2009    Gioffre  . Ankle fracture surgery Right 1984  . Surgery scrotal / testicular Right 2012    "removed the sac" (05/15/2013)  . Coronary artery bypass graft  1989    triple bypass [facility]  . Inguinal hernia repair Bilateral 1970's    "2 on one side; 1 on the other"   . Tonsillectomy and adenoidectomy  ~1961  . Cardiac catheterization      several  . Coronary angioplasty  95, 98, 2000, 2001  . Hemi-microdiscectomy lumbar laminectomy level 1 Right 09/18/2013    Procedure: CENTRAL DECOMPRESSION @ L4-5 FOR SPINAL STENOSIS. EXCISION OF SYNOVIAL CYST @ L4-5 RIGHT, MICRODISCECTOMY @L4 -5 RIGHT ;  Surgeon: Tobi Bastos, MD;  Location: [facility];  Service: Orthopedics;  Laterality:  Right;  . Left heart catheterization with coronary angiogram Bilateral 05/15/2013    Procedure: LEFT HEART CATHETERIZATION WITH CORONARY ANGIOGRAM;  Surgeon: Wellington Hampshire, MD;  Location: [facility];  Service: Cardiovascular;  Laterality: Bilateral;    reports that he quit smoking about 20 years ago. His smoking use included Cigarettes. He has a 45 pack-year smoking history. He has never used smokeless tobacco. He reports that he does not drink alcohol or use illicit drugs. family history includes Coronary artery disease in his father and mother; Esophageal cancer in his brother; Heart disease in his brother. Allergies   Allergen Reactions  . Metoprolol     REACTION: severe bradycardia   Current Outpatient Prescriptions on File Prior to Visit  Medication Sig Dispense Refill  . albuterol (PROAIR HFA) 108 (90 BASE) MCG/ACT inhaler Inhale 2 puffs into the lungs every 6 (six) hours as needed for wheezing or shortness of breath.     Marland Kitchen aspirin 81 MG tablet Take 81 mg by mouth daily.     . isosorbide mononitrate (IMDUR) 30 MG 24 hr tablet Take 1 tablet (30 mg total) by mouth daily. 90 tablet 3  . Multiple Vitamin (MULTIVITAMIN WITH MINERALS) TABS tablet Take 1 tablet by mouth every morning.    . nitroGLYCERIN (NITROSTAT) 0.4 MG SL tablet Place 0.4 mg under the tongue every 5 (five) minutes as needed for chest pain.    Marland Kitchen omeprazole (PRILOSEC) 20 MG capsule Take 20 mg by mouth daily.    . sulindac (CLINORIL) 200 MG tablet Take 200 mg by mouth 2 (two) times daily.     . temazepam (RESTORIL) 30 MG capsule Take 30 mg by mouth at bedtime as needed for sleep.      No current facility-administered medications on file prior to visit.    Review of Systems Constitutional: Negative for increased diaphoresis, other activity, appetite or other siginficant weight change  HENT: Negative for worsening hearing loss, ear pain, facial swelling, mouth sores and neck stiffness.   Eyes: Negative for other worsening pain, redness or visual disturbance.  Respiratory: Negative for shortness of breath and wheezing.   Cardiovascular: Negative for chest pain and palpitations.  Gastrointestinal: Negative for diarrhea, blood in stool, abdominal distention or other pain Genitourinary: Negative for hematuria, flank pain or change in urine volume.  Musculoskeletal: Negative for myalgias or other joint complaints.  Skin: Negative for color change and wound.  Neurological: Negative for syncope and numbness. other than noted Hematological: Negative for adenopathy. or other swelling Psychiatric/Behavioral: Negative for hallucinations,  self-injury, decreased concentration or other worsening agitation.      Objective:   Physical Exam BP 122/70 mmHg  Pulse 80  Temp(Src) 98.5 F (36.9 C) (Oral)  Ht 6' (1.829 m)  Wt 255 lb 2 oz (115.724 kg)  BMI 34.59 kg/m2  SpO2 96% VS noted,  Constitutional: Pt is oriented to person, place, and time. Appears well-developed and well-nourished.  Head: Normocephalic and atraumatic.  Right Ear: External ear normal.  Left Ear: External ear normal.  Nose: Nose normal.  Mouth/Throat: Oropharynx is clear and moist.  Eyes: Conjunctivae and EOM are normal. Pupils are equal, round, and reactive to light.  Neck: Normal range of motion. Neck supple. No JVD present. No tracheal deviation present.  Cardiovascular: Normal rate, regular rhythm, normal heart sounds and intact distal pulses.   Pulmonary/Chest: Effort normal and breath sounds without rales or wheezing  Abdominal: Soft. Bowel sounds are normal. NT. No HSM  Musculoskeletal: Normal range of  motion. Exhibits no edema.  Lymphadenopathy:  Has no cervical adenopathy.  Neurological: Pt is alert and oriented to person, place, and time. Pt has normal reflexes. No cranial nerve deficit. Motor grossly intact Skin: Skin is warm and dry. + dry rash noted bilat abd and low lumbar.  Psychiatric:  Has normal mood and affect. Behavior is normal.     Assessment & Plan:

## 2014-09-25 ENCOUNTER — Telehealth: Payer: Self-pay | Admitting: Internal Medicine

## 2014-09-25 MED ORDER — AMITRIPTYLINE HCL 25 MG PO TABS: 25.0000 mg | ORAL_TABLET | Freq: Every day | ORAL | Status: DC

## 2014-09-25 NOTE — Telephone Encounter (Signed)
Patient informed script sent into [facility].

## 2014-09-25 NOTE — Telephone Encounter (Signed)
Robert [name]

## 2014-09-25 NOTE — Telephone Encounter (Signed)
States amitriptyline should have been sent to [facility]

## 2014-10-08 ENCOUNTER — Encounter: Payer: Self-pay | Admitting: Internal Medicine

## 2014-11-19 ENCOUNTER — Other Ambulatory Visit: Payer: Self-pay | Admitting: Cardiovascular Disease

## 2014-12-08 ENCOUNTER — Encounter: Payer: Self-pay | Admitting: Cardiovascular Disease

## 2014-12-08 ENCOUNTER — Ambulatory Visit (INDEPENDENT_AMBULATORY_CARE_PROVIDER_SITE_OTHER): Payer: Commercial Managed Care - HMO | Admitting: Cardiovascular Disease

## 2014-12-08 VITALS — BP 126/70 | HR 65 | Ht 72.0 in | Wt 246.1 lb

## 2014-12-08 DIAGNOSIS — I10 Essential (primary) hypertension: Secondary | ICD-10-CM | POA: Diagnosis not present

## 2014-12-08 MED ORDER — ISOSORBIDE MONONITRATE ER 30 MG PO TB24: 30.0000 mg | ORAL_TABLET | Freq: Every day | ORAL | Status: DC

## 2014-12-08 NOTE — Patient Instructions (Signed)
Your physician wants you to follow-up in: 1 YEAR with Dr Cooper.  You will receive a reminder letter in the mail two months in advance. If you don't receive a letter, please call our office to schedule the follow-up appointment.  Your physician recommends that you continue on your current medications as directed. Please refer to the Current Medication list given to you today.  

## 2014-12-08 NOTE — Progress Notes (Signed)
Cardiology Office Note   Date:  12/09/2014   ID:  Robert [name], Robert [name] [birth year], MRN [id number]  PCP:  Cathlean Cower, MD  Cardiologist:  Sherren Mocha, MD    No chief complaint on file.    History of Present Illness: Robert [name] is a 69 y.o. male who presents for CAD s/p CABG. the patient had multivessel CABG in 1989. He has also undergone multiple PCI procedures. His last heart catheterization in 2014 demonstrated stable coronary anatomy with continued patency of the LIMA to LAD and vein graft RCA. The patient's native left circumflex had a patent stent site. His LVEF was estimated at 60%.  The patient feels well. He's been playing a lot of golf. He has no exertional symptoms. He specifically denies chest pain, chest pressure, shortness of breath, leg swelling, or heart palpitations. He feels that Imdur has really helped his exercise capacity and reduced his angina.   Past Medical History  Diagnosis Date  . ALLERGIC RHINITIS 08/20/2007  . BENIGN PROSTATIC HYPERTROPHY 08/20/2007  . CAD (coronary artery disease)     a. s/p CABG x 3 in 1989;  b. MI in 1995 with BMS to VG->RCA;  c. 07/2000 PCI/BMS to native LCX;  d. Known CTO of VG->Diag;  e. Mult caths w/ stable anatomy;  f. 04/2013 Cath: LM nl, LAD 100, LCX 10%isr, 60m, OM1/2/3 min irregs, RI min irregs, RCA 80-90p/168m, VG->RCA 20-30isr, LIMA->LAD ok, EF 60%-->Med Rx.  . ELBOW PAIN, LEFT 04/15/2008  . ERECTILE DYSFUNCTION 08/20/2007  . HYPERLIPIDEMIA TYPE I / IV 09/03/2008  . HYPERTENSION 08/20/2007  . INSOMNIA-SLEEP DISORDER-UNSPEC 03/17/2008  . PREMATURE VENTRICULAR CONTRACTIONS 08/20/2007  . SCROTAL MASS 10/09/2008  . URINARY INCONTINENCE, MALE 10/09/2008  . UTI 04/24/2009    "just this once" (05/15/2013)  . History of meningitis     2nd grade, 8th grade, 9th grade, age 38  . Microcytosis   . OSA on CPAP   . Chronic lower back pain   . Anginal pain May 13 2013    cath done=everything ok  . Myocardial infarction June 1995   . Asthma     "very slight"   . GERD (gastroesophageal reflux disease)   . Arthritis     "back; knees; shoulders"     Past Surgical History  Procedure Laterality Date  . Transurethral resection of prostate  10/02/2009    Gyrus transurethral resection   . Cystoscopy  08/07/2009    and right hydrocelectomy  . Colonoscopy w/ biopsies and polypectomy  11/20/2003    adenomatous polyp, internal and external hemorrhoids  . Foot tendon surgery Left 2012    "donor tendon"   . Shoulder open rotator cuff repair Right 2009    Gioffre  . Ankle fracture surgery Right 1984  . Surgery scrotal / testicular Right 2012    "removed the sac" (05/15/2013)  . Coronary artery bypass graft  1989    triple bypass [facility]  . Inguinal hernia repair Bilateral 1970's    "2 on one side; 1 on the other"   . Tonsillectomy and adenoidectomy  ~1961  . Cardiac catheterization      several  . Coronary angioplasty  95, 98, 2000, 2001  . Hemi-microdiscectomy lumbar laminectomy level 1 Right 09/18/2013    Procedure: CENTRAL DECOMPRESSION @ L4-5 FOR SPINAL STENOSIS. EXCISION OF SYNOVIAL CYST @ L4-5 RIGHT, MICRODISCECTOMY @L4 -5 RIGHT ;  Surgeon: Tobi Bastos, MD;  Location: [facility];  Service: Orthopedics;  Laterality: Right;  . Left heart  catheterization with coronary angiogram Bilateral 05/15/2013    Procedure: LEFT HEART CATHETERIZATION WITH CORONARY ANGIOGRAM;  Surgeon: Wellington Hampshire, MD;  Location: [facility];  Service: Cardiovascular;  Laterality: Bilateral;    Current Outpatient Prescriptions  Medication Sig Dispense Refill  . albuterol (PROAIR HFA) 108 (90 BASE) MCG/ACT inhaler Inhale 2 puffs into the lungs every 6 (six) hours as needed for wheezing or shortness of breath.     Marland Kitchen amitriptyline (ELAVIL) 25 MG tablet Take 1 tablet (25 mg total) by mouth at bedtime. 90 tablet 1  . aspirin 81 MG tablet Take 81 mg by mouth daily.     Marland Kitchen atenolol (TENORMIN) 25 MG tablet Take 1 tablet (25 mg total) by mouth at bedtime.  90 tablet 3  . clopidogrel (PLAVIX) 75 MG tablet Take 1 tablet (75 mg total) by mouth daily. 90 tablet 3  . clotrimazole-betamethasone (LOTRISONE) cream Use as directed twice per day as needed 15 g 1  . isosorbide mononitrate (IMDUR) 30 MG 24 hr tablet Take 1 tablet (30 mg total) by mouth daily. 90 tablet 3  . lisinopril (PRINIVIL,ZESTRIL) 10 MG tablet Take 1 tablet (10 mg total) by mouth at bedtime. 90 tablet 3  . Multiple Vitamin (MULTIVITAMIN WITH MINERALS) TABS tablet Take 1 tablet by mouth every morning.    . nitroGLYCERIN (NITROSTAT) 0.4 MG SL tablet Place 0.4 mg under the tongue every 5 (five) minutes as needed for chest pain.    Marland Kitchen omeprazole (PRILOSEC) 20 MG capsule Take 20 mg by mouth daily.    . simvastatin (ZOCOR) 80 MG tablet Take 0.5 tablets (40 mg total) by mouth at bedtime. 90 tablet 3  . sulindac (CLINORIL) 200 MG tablet Take 200 mg by mouth 2 (two) times daily.     . temazepam (RESTORIL) 30 MG capsule Take 30 mg by mouth at bedtime as needed for sleep.      No current facility-administered medications for this visit.    Allergies:   Metoprolol   Social History:  The patient  reports that he quit smoking about 20 years ago. His smoking use included Cigarettes. He has a 45 pack-year smoking history. He has never used smokeless tobacco. He reports that he does not drink alcohol or use illicit drugs.   Family History:  The patient's family history includes Coronary artery disease in his father and mother; Esophageal cancer in his brother; Heart disease in his brother.    ROS:  Please see the history of present illness.   All other systems are reviewed and negative.    PHYSICAL EXAM: VS:  BP 126/70 mmHg  Pulse 65  Ht 6' (1.829 m)  Wt 246 lb 1.9 oz (111.639 kg)  BMI 33.37 kg/m2 , BMI Body mass index is 33.37 kg/(m^2). GEN: Well nourished, well developed, in no acute distress HEENT: normal Neck: no JVD, no masses. No carotid bruits Cardiac: RRR without murmur or gallop                 Respiratory:  clear to auscultation bilaterally, normal work of breathing GI: soft, nontender, nondistended, + BS MS: no deformity or atrophy Ext: no pretibial edema, pedal pulses 2+= bilaterally Skin: warm and dry, no rash Neuro:  Strength and sensation are intact Psych: euthymic mood, full affect  EKG:  EKG is ordered today. The ekg ordered today shows NSR 65 bpm, within normal limits  Recent Labs: No results found for requested labs within last 365 days.   Lipid Panel  Component Value Date/Time   CHOL 154 05/15/2013 0810   TRIG 76 05/15/2013 0810   HDL 48 05/15/2013 0810   CHOLHDL 3.2 05/15/2013 0810   VLDL 15 05/15/2013 0810   LDLCALC 91 05/15/2013 0810      Wt Readings from Last 3 Encounters:  12/08/14 246 lb 1.9 oz (111.639 kg)  09/24/14 255 lb 2 oz (115.724 kg)  12/10/13 249 lb (112.946 kg)     Cardiac Studies Reviewed: Labs reviewed 07/17/2014: Cholesterol 144, HDL 45, LDL 90, creatinine 0.9, glucose 100, triglycerides 81  Last cath reviewed with patient.  ASSESSMENT AND PLAN: 1.  CAD, native vessel and bypass graft disease: Patient has no symptoms of angina. His isosorbide was refilled. He otherwise will continue on his current medical program which includes long-term dual antiplatelet therapy with aspirin and clopidogrel, a beta blocker, and ACE inhibitor, and a statin drug.  2. Essential hypertension: Blood pressure well controlled on atenolol, isosorbide, and lisinopril.  3. Hyperlipidemia: Labs from the [facility] medical system reviewed as above. He continues on simvastatin.   Current medicines are reviewed with the patient today.  The patient does not have concerns regarding medicines.  The following changes have been made:  no change  Labs/ tests ordered today include:   Orders Placed This Encounter  Procedures  . EKG 12-Lead    Disposition:   FU one year  Signed, Sherren Mocha, MD  12/09/2014 12:01 AM    [facility]  [facility] [address], [place], [place]  [address] Phone: [phone]; Fax: [phone]

## 2015-02-03 DIAGNOSIS — H5210 Myopia, unspecified eye: Secondary | ICD-10-CM | POA: Diagnosis not present

## 2015-02-03 DIAGNOSIS — H5213 Myopia, bilateral: Secondary | ICD-10-CM | POA: Diagnosis not present

## 2015-04-24 ENCOUNTER — Other Ambulatory Visit: Payer: Self-pay

## 2015-04-24 MED ORDER — TEMAZEPAM 30 MG PO CAPS: 30.0000 mg | ORAL_CAPSULE | Freq: Every evening | ORAL | Status: DC | PRN

## 2015-04-24 NOTE — Telephone Encounter (Signed)
Pt called requesting a 90 day refill on Temazepam to the [facility]

## 2015-04-24 NOTE — Telephone Encounter (Signed)
Rx faxed to pharmacy  

## 2015-04-24 NOTE — Telephone Encounter (Signed)
Done hardcopy to Dahlia  

## 2015-04-30 DIAGNOSIS — L259 Unspecified contact dermatitis, unspecified cause: Secondary | ICD-10-CM | POA: Diagnosis not present

## 2015-04-30 DIAGNOSIS — L309 Dermatitis, unspecified: Secondary | ICD-10-CM | POA: Diagnosis not present

## 2015-05-21 ENCOUNTER — Encounter: Payer: Self-pay | Admitting: Internal Medicine

## 2015-05-21 ENCOUNTER — Ambulatory Visit (INDEPENDENT_AMBULATORY_CARE_PROVIDER_SITE_OTHER): Payer: Commercial Managed Care - HMO | Admitting: Internal Medicine

## 2015-05-21 VITALS — BP 130/84 | HR 61 | Temp 97.9°F | Ht 72.0 in | Wt 249.0 lb

## 2015-05-21 DIAGNOSIS — H6123 Impacted cerumen, bilateral: Secondary | ICD-10-CM | POA: Diagnosis not present

## 2015-05-21 DIAGNOSIS — H9193 Unspecified hearing loss, bilateral: Secondary | ICD-10-CM | POA: Diagnosis not present

## 2015-05-21 DIAGNOSIS — R21 Rash and other nonspecific skin eruption: Secondary | ICD-10-CM

## 2015-05-21 DIAGNOSIS — I10 Essential (primary) hypertension: Secondary | ICD-10-CM | POA: Diagnosis not present

## 2015-05-21 MED ORDER — TEMAZEPAM 30 MG PO CAPS: 30.0000 mg | ORAL_CAPSULE | Freq: Every evening | ORAL | Status: DC | PRN

## 2015-05-21 NOTE — Assessment & Plan Note (Signed)
Ok for use lotrisone he has at home for itchy rash left upper buttock, consdier derm if not improved

## 2015-05-21 NOTE — Assessment & Plan Note (Signed)
stable overall by history and exam, recent data reviewed with pt, and pt to continue medical treatment as before,  to f/u any worsening symptoms or concerns BP Readings from Last 3 Encounters:  05/21/15 130/84  12/08/14 126/70  09/24/14 122/70

## 2015-05-21 NOTE — Assessment & Plan Note (Signed)
Chronic overall mild to mod, has f/[facility] wks,  to f/u any worsening symptoms or concerns

## 2015-05-21 NOTE — Progress Notes (Signed)
Subjective:    Patient ID: Robert [name], male    DOB: 05-26-1946, 69 y.o.   MRN: [id number]  HPI  Here to f/u with acute on chronic hearing loss, wondering if wax impactions have returned, as no HA, fever, sinus congestion , ST, cough and Pt denies chest pain, increased sob or doe, wheezing, orthopnea, PND, increased LE swelling, palpitations, dizziness or syncope.  Does have some intermittent popping to the left ear, has not tried mucinex.  Has ongoing hearing loss and f/u audiology testing planned at [facility] in 2 wks.  Also has other itching to left scalp behind the left ear without rash, swelling or pain.  Also has recently used a TENS unit with a pad on the skin with marked itchy /swollen area left over after pad removed.  Pt denies new neurological symptoms such as new headache, or facial or extremity weakness or numbness   Pt denies polydipsia, polyuria Past Medical History  Diagnosis Date  . ALLERGIC RHINITIS 08/20/2007  . BENIGN PROSTATIC HYPERTROPHY 08/20/2007  . CAD (coronary artery disease)     a. s/p CABG x 3 in 1989;  b. MI in 1995 with BMS to VG->RCA;  c. 07/2000 PCI/BMS to native LCX;  d. Known CTO of VG->Diag;  e. Mult caths w/ stable anatomy;  f. 04/2013 Cath: LM nl, LAD 100, LCX 10%isr, 35m, OM1/2/3 min irregs, RI min irregs, RCA 80-90p/135m, VG->RCA 20-30isr, LIMA->LAD ok, EF 60%-->Med Rx.  . ELBOW PAIN, LEFT 04/15/2008  . ERECTILE DYSFUNCTION 08/20/2007  . HYPERLIPIDEMIA TYPE I / IV 09/03/2008  . HYPERTENSION 08/20/2007  . INSOMNIA-SLEEP DISORDER-UNSPEC 03/17/2008  . PREMATURE VENTRICULAR CONTRACTIONS 08/20/2007  . SCROTAL MASS 10/09/2008  . URINARY INCONTINENCE, MALE 10/09/2008  . UTI 04/24/2009    "just this once" (05/15/2013)  . History of meningitis     2nd grade, 8th grade, 9th grade, age 58  . Microcytosis   . OSA on CPAP   . Chronic lower back pain   . Anginal pain May 13 2013    cath done=everything ok  . Myocardial infarction June 1995  . Asthma     "very slight"     . GERD (gastroesophageal reflux disease)   . Arthritis     "back; knees; shoulders"    Past Surgical History  Procedure Laterality Date  . Transurethral resection of prostate  10/02/2009    Gyrus transurethral resection   . Cystoscopy  08/07/2009    and right hydrocelectomy  . Colonoscopy w/ biopsies and polypectomy  11/20/2003    adenomatous polyp, internal and external hemorrhoids  . Foot tendon surgery Left 2012    "donor tendon"   . Shoulder open rotator cuff repair Right 2009    Gioffre  . Ankle fracture surgery Right 1984  . Surgery scrotal / testicular Right 2012    "removed the sac" (05/15/2013)  . Coronary artery bypass graft  1989    triple bypass [facility]  . Inguinal hernia repair Bilateral 1970's    "2 on one side; 1 on the other"   . Tonsillectomy and adenoidectomy  ~1961  . Cardiac catheterization      several  . Coronary angioplasty  95, 98, 2000, 2001  . Hemi-microdiscectomy lumbar laminectomy level 1 Right 09/18/2013    Procedure: CENTRAL DECOMPRESSION @ L4-5 FOR SPINAL STENOSIS. EXCISION OF SYNOVIAL CYST @ L4-5 RIGHT, MICRODISCECTOMY @L4 -5 RIGHT ;  Surgeon: Tobi Bastos, MD;  Location: [facility];  Service: Orthopedics;  Laterality: Right;  . Left heart  catheterization with coronary angiogram Bilateral 05/15/2013    Procedure: LEFT HEART CATHETERIZATION WITH CORONARY ANGIOGRAM;  Surgeon: Wellington Hampshire, MD;  Location: [place] CATH LAB;  Service: Cardiovascular;  Laterality: Bilateral;    reports that he quit smoking about 21 years ago. His smoking use included Cigarettes. He has a 45 pack-year smoking history. He has never used smokeless tobacco. He reports that he does not drink alcohol or use illicit drugs. family history includes Coronary artery disease in his father and mother; Esophageal cancer in his brother; Heart disease in his brother. Allergies  Allergen Reactions  . Metoprolol Other (See Comments)    REACTION: severe bradycardia   Current Outpatient  Prescriptions on File Prior to Visit  Medication Sig Dispense Refill  . albuterol (PROAIR HFA) 108 (90 BASE) MCG/ACT inhaler Inhale 2 puffs into the lungs every 6 (six) hours as needed for wheezing or shortness of breath.     Marland Kitchen amitriptyline (ELAVIL) 25 MG tablet Take 1 tablet (25 mg total) by mouth at bedtime. 90 tablet 1  . aspirin 81 MG tablet Take 81 mg by mouth daily.     Marland Kitchen atenolol (TENORMIN) 25 MG tablet Take 1 tablet (25 mg total) by mouth at bedtime. 90 tablet 3  . clopidogrel (PLAVIX) 75 MG tablet Take 1 tablet (75 mg total) by mouth daily. 90 tablet 3  . isosorbide mononitrate (IMDUR) 30 MG 24 hr tablet Take 1 tablet (30 mg total) by mouth daily. 90 tablet 3  . lisinopril (PRINIVIL,ZESTRIL) 10 MG tablet Take 1 tablet (10 mg total) by mouth at bedtime. 90 tablet 3  . Multiple Vitamin (MULTIVITAMIN WITH MINERALS) TABS tablet Take 1 tablet by mouth every morning.    . nitroGLYCERIN (NITROSTAT) 0.4 MG SL tablet Place 0.4 mg under the tongue every 5 (five) minutes as needed for chest pain.    Marland Kitchen omeprazole (PRILOSEC) 20 MG capsule Take 20 mg by mouth daily.    . simvastatin (ZOCOR) 80 MG tablet Take 0.5 tablets (40 mg total) by mouth at bedtime. 90 tablet 3  . sulindac (CLINORIL) 200 MG tablet Take 200 mg by mouth 2 (two) times daily.     . clotrimazole-betamethasone (LOTRISONE) cream Use as directed twice per day as needed (Patient not taking: Reported on 05/21/2015) 15 g 1   No current facility-administered medications on file prior to visit.   Review of Systems  Constitutional: Negative for unusual diaphoresis or night sweats HENT: Negative for ringing in ear or discharge Eyes: Negative for double vision or worsening visual disturbance.  Respiratory: Negative for choking and stridor.   Gastrointestinal: Negative for vomiting or other signifcant bowel change Genitourinary: Negative for hematuria or change in urine volume.  Musculoskeletal: Negative for other MSK pain or swelling Skin:  Negative for color change and worsening wound.  Neurological: Negative for tremors and numbness other than noted  Psychiatric/Behavioral: Negative for decreased concentration or agitation other than above       Objective:   Physical Exam BP 130/84 mmHg  Pulse 61  Temp(Src) 97.9 F (36.6 C) (Oral)  Ht 6' (1.829 m)  Wt 249 lb (112.946 kg)  BMI 33.76 kg/m2  SpO2 96% VS noted, not ill appearing Constitutional: Pt appears in no significant distress HENT: Head: NCAT.  Right Ear: External ear normal.  Left Ear: External ear normal.  Eyes: . Pupils are equal, round, and reactive to light. Conjunctivae and EOM are normal Bilat TM's normal appearance without erythema, wax impactions irragated out Neck: Normal range  of motion. Neck supple.  Cardiovascular: Normal rate and regular rhythm.   Pulmonary/Chest: Effort normal and breath sounds without rales or wheezing.  Neurological: Pt is alert. Not confused , motor grossly intact Skin: Skin is warm. Left upper buttock with 2 cm area redness slightly raised nontender, no drainage, no LE edema,  Psychiatric: Pt behavior is normal. No agitation.     Assessment & Plan:

## 2015-05-21 NOTE — Progress Notes (Signed)
Pre visit review using our clinic review tool, if applicable. No additional management support is needed unless otherwise documented below in the visit note. 

## 2015-05-21 NOTE — Patient Instructions (Signed)
OK to try Mucinex for ear congestion, but the wax is removed  Please follow up at the [facility] for the hearing test as you have planned  OK to use the lotrisone cream for the rash as needed  OK to use benadryl cream OTC for other itchy areas  Please continue all other medications as before, and refills have been done if requested - the temazepam  Please have the pharmacy call with any other refills you may need.  Please keep your appointments with your specialists as you may have planned

## 2015-05-21 NOTE — Assessment & Plan Note (Signed)
Resolved with irrigation,  to f/u any worsening symptoms or concerns 

## 2015-05-28 ENCOUNTER — Other Ambulatory Visit: Payer: Self-pay | Admitting: Internal Medicine

## 2015-06-29 DIAGNOSIS — Z4789 Encounter for other orthopedic aftercare: Secondary | ICD-10-CM | POA: Diagnosis not present

## 2015-06-30 ENCOUNTER — Emergency Department (HOSPITAL_COMMUNITY)
Admission: EM | Admit: 2015-06-30 | Discharge: 2015-06-30 | Disposition: A | Discharge status: Home / Self Care | Payer: Commercial Managed Care - HMO | Attending: Emergency Medicine | Admitting: Emergency Medicine

## 2015-06-30 ENCOUNTER — Encounter (HOSPITAL_COMMUNITY): Payer: Self-pay | Admitting: Emergency Medicine

## 2015-06-30 ENCOUNTER — Emergency Department (HOSPITAL_COMMUNITY): Payer: Commercial Managed Care - HMO

## 2015-06-30 DIAGNOSIS — I25119 Atherosclerotic heart disease of native coronary artery with unspecified angina pectoris: Secondary | ICD-10-CM | POA: Insufficient documentation

## 2015-06-30 DIAGNOSIS — I252 Old myocardial infarction: Secondary | ICD-10-CM | POA: Diagnosis not present

## 2015-06-30 DIAGNOSIS — Y998 Other external cause status: Secondary | ICD-10-CM | POA: Insufficient documentation

## 2015-06-30 DIAGNOSIS — Z87448 Personal history of other diseases of urinary system: Secondary | ICD-10-CM | POA: Diagnosis not present

## 2015-06-30 DIAGNOSIS — Z7902 Long term (current) use of antithrombotics/antiplatelets: Secondary | ICD-10-CM | POA: Insufficient documentation

## 2015-06-30 DIAGNOSIS — Z79899 Other long term (current) drug therapy: Secondary | ICD-10-CM | POA: Insufficient documentation

## 2015-06-30 DIAGNOSIS — Z87891 Personal history of nicotine dependence: Secondary | ICD-10-CM | POA: Insufficient documentation

## 2015-06-30 DIAGNOSIS — I10 Essential (primary) hypertension: Secondary | ICD-10-CM | POA: Insufficient documentation

## 2015-06-30 DIAGNOSIS — E783 Hyperchylomicronemia: Secondary | ICD-10-CM | POA: Diagnosis not present

## 2015-06-30 DIAGNOSIS — G4733 Obstructive sleep apnea (adult) (pediatric): Secondary | ICD-10-CM | POA: Insufficient documentation

## 2015-06-30 DIAGNOSIS — Z7982 Long term (current) use of aspirin: Secondary | ICD-10-CM | POA: Insufficient documentation

## 2015-06-30 DIAGNOSIS — T148 Other injury of unspecified body region: Secondary | ICD-10-CM | POA: Diagnosis not present

## 2015-06-30 DIAGNOSIS — M549 Dorsalgia, unspecified: Secondary | ICD-10-CM | POA: Diagnosis not present

## 2015-06-30 DIAGNOSIS — S0990XA Unspecified injury of head, initial encounter: Secondary | ICD-10-CM | POA: Diagnosis not present

## 2015-06-30 DIAGNOSIS — Y9389 Activity, other specified: Secondary | ICD-10-CM | POA: Insufficient documentation

## 2015-06-30 DIAGNOSIS — M545 Low back pain: Secondary | ICD-10-CM | POA: Diagnosis not present

## 2015-06-30 DIAGNOSIS — Z8661 Personal history of infections of the central nervous system: Secondary | ICD-10-CM | POA: Diagnosis not present

## 2015-06-30 DIAGNOSIS — Y92410 Unspecified street and highway as the place of occurrence of the external cause: Secondary | ICD-10-CM | POA: Insufficient documentation

## 2015-06-30 DIAGNOSIS — S3992XA Unspecified injury of lower back, initial encounter: Secondary | ICD-10-CM | POA: Diagnosis not present

## 2015-06-30 DIAGNOSIS — J45909 Unspecified asthma, uncomplicated: Secondary | ICD-10-CM | POA: Diagnosis not present

## 2015-06-30 DIAGNOSIS — Z9981 Dependence on supplemental oxygen: Secondary | ICD-10-CM | POA: Insufficient documentation

## 2015-06-30 DIAGNOSIS — S199XXA Unspecified injury of neck, initial encounter: Secondary | ICD-10-CM | POA: Diagnosis not present

## 2015-06-30 MED ORDER — IBUPROFEN 600 MG PO TABS: 600.0000 mg | ORAL_TABLET | Freq: Three times a day (TID) | ORAL | Status: AC
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
## 2015-07-14 DIAGNOSIS — Z01812 Encounter for preprocedural laboratory examination: Secondary | ICD-10-CM | POA: Diagnosis not present

## 2015-07-14 DIAGNOSIS — M5442 Lumbago with sciatica, left side: Secondary | ICD-10-CM | POA: Diagnosis not present

## 2015-07-21 ENCOUNTER — Telehealth: Payer: Self-pay | Admitting: *Deleted

## 2015-07-21 DIAGNOSIS — I10 Essential (primary) hypertension: Secondary | ICD-10-CM

## 2015-07-21 DIAGNOSIS — M5442 Lumbago with sciatica, left side: Secondary | ICD-10-CM | POA: Diagnosis not present

## 2015-07-21 MED ORDER — ISOSORBIDE MONONITRATE ER 30 MG PO TB24: 30.0000 mg | ORAL_TABLET | Freq: Every day | ORAL | Status: DC

## 2015-07-21 NOTE — Telephone Encounter (Signed)
Receive call pt is needing rx sent on his isosorbide to [facility] so he can receive at no cost. Inform wife sending rx electronically.../lm,b

## 2015-07-27 DIAGNOSIS — M5442 Lumbago with sciatica, left side: Secondary | ICD-10-CM | POA: Diagnosis not present

## 2015-08-01 DIAGNOSIS — M5442 Lumbago with sciatica, left side: Secondary | ICD-10-CM | POA: Diagnosis not present

## 2015-08-11 ENCOUNTER — Other Ambulatory Visit: Payer: Self-pay | Admitting: Orthopedic Surgery

## 2015-08-11 DIAGNOSIS — M5442 Lumbago with sciatica, left side: Secondary | ICD-10-CM

## 2015-08-27 ENCOUNTER — Ambulatory Visit
Admission: RE | Admit: 2015-08-27 | Discharge: 2015-08-27 | Disposition: A | Discharge status: Home / Self Care | Payer: Commercial Managed Care - HMO | Source: Ambulatory Visit | Attending: Orthopedic Surgery | Admitting: Orthopedic Surgery

## 2015-08-27 DIAGNOSIS — M5442 Lumbago with sciatica, left side: Secondary | ICD-10-CM

## 2015-08-27 DIAGNOSIS — K824 Cholesterolosis of gallbladder: Secondary | ICD-10-CM | POA: Diagnosis not present

## 2015-08-27 HISTORY — DX: Cholesterolosis of gallbladder: K82.4

## 2015-09-03 DIAGNOSIS — M5442 Lumbago with sciatica, left side: Secondary | ICD-10-CM | POA: Diagnosis not present

## 2015-09-05 ENCOUNTER — Other Ambulatory Visit: Payer: Self-pay | Admitting: Internal Medicine

## 2015-09-06 DIAGNOSIS — K7689 Other specified diseases of liver: Secondary | ICD-10-CM

## 2015-09-06 HISTORY — DX: Other specified diseases of liver: K76.89

## 2015-09-29 ENCOUNTER — Other Ambulatory Visit (INDEPENDENT_AMBULATORY_CARE_PROVIDER_SITE_OTHER): Payer: Commercial Managed Care - HMO

## 2015-09-29 ENCOUNTER — Other Ambulatory Visit: Payer: Self-pay | Admitting: Internal Medicine

## 2015-09-29 ENCOUNTER — Encounter: Payer: Self-pay | Admitting: Internal Medicine

## 2015-09-29 ENCOUNTER — Ambulatory Visit (INDEPENDENT_AMBULATORY_CARE_PROVIDER_SITE_OTHER): Payer: Commercial Managed Care - HMO | Admitting: Internal Medicine

## 2015-09-29 VITALS — BP 148/86 | HR 68 | Temp 98.7°F | Ht 72.0 in | Wt 247.0 lb

## 2015-09-29 DIAGNOSIS — Z0000 Encounter for general adult medical examination without abnormal findings: Secondary | ICD-10-CM

## 2015-09-29 DIAGNOSIS — I10 Essential (primary) hypertension: Secondary | ICD-10-CM

## 2015-09-29 LAB — PSA: PSA: 0.93 ng/mL (ref 0.10–4.00)

## 2015-09-29 NOTE — Assessment & Plan Note (Signed)
Mild elev today, BP usually stable < 140/90 at hoem, ow/ stable overall by history and exam, recent data reviewed with pt, and pt to continue medical treatment as before,  to f/u any worsening symptoms or concerns BP Readings from Last 3 Encounters:  09/29/15 148/86  06/30/15 130/78  05/21/15 130/84

## 2015-09-29 NOTE — Progress Notes (Signed)
Pre visit review using our clinic review tool, if applicable. No additional management support is needed unless otherwise documented below in the visit note. 

## 2015-09-29 NOTE — Telephone Encounter (Signed)
Rx faxed to pharmacy  

## 2015-09-29 NOTE — Assessment & Plan Note (Signed)
Overall doing well, age appropriate education and counseling updated, referrals for preventative services and immunizations addressed, dietary and smoking counseling addressed, most recent labs reviewed.  I have personally reviewed and have noted:  1) the patient's medical and social history 2) The pt's use of alcohol, tobacco, and illicit drugs 3) The patient's current medications and supplements 4) Functional ability including ADL's, fall risk, home safety risk, hearing and visual impairment 5) Diet and physical activities 6) Evidence for depression or mood disorder 7) The patient's height, weight, and BMI have been recorded in the chart  I have made referrals, and provided counseling and education based on review of the above For PSA as had not been done recently at [facility] from [place] reviewed from nov 2016

## 2015-09-29 NOTE — Patient Instructions (Signed)
Please continue all other medications as before, and refills have been done if requested.  Please have the pharmacy call with any other refills you may need.  Please continue your efforts at being more active, low cholesterol diet, and weight control.  You are otherwise up to date with prevention measures today.  Please keep your appointments with your specialists as you may have planned  Please go to the LAB in the Basement (turn left off the elevator) for the tests to be done today - just the PSA and the Hep C test  You will be contacted by phone if any changes need to be made immediately.  Otherwise, you will receive a letter about your results with an explanation, but please check with MyChart first.  Please remember to sign up for MyChart if you have not done so, as this will be important to you in the future with finding out test results, communicating by private email, and scheduling acute appointments online when needed.  Please return in 1 year for your yearly visit, or sooner if needed
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
## 2015-09-29 NOTE — Telephone Encounter (Signed)
Done hardcopy to Dahlia  

## 2015-09-30 ENCOUNTER — Encounter: Payer: Self-pay | Admitting: Internal Medicine

## 2015-09-30 LAB — HEPATITIS C ANTIBODY: HCV AB: NEGATIVE

## 2015-11-04 ENCOUNTER — Other Ambulatory Visit: Payer: Self-pay | Admitting: Internal Medicine

## 2015-11-16 ENCOUNTER — Other Ambulatory Visit: Payer: Self-pay | Admitting: Internal Medicine

## 2016-01-15 DIAGNOSIS — M1712 Unilateral primary osteoarthritis, left knee: Secondary | ICD-10-CM | POA: Diagnosis not present

## 2016-01-21 ENCOUNTER — Other Ambulatory Visit: Payer: Self-pay | Admitting: Internal Medicine

## 2016-01-25 ENCOUNTER — Encounter: Payer: Self-pay | Admitting: Cardiovascular Disease

## 2016-01-25 ENCOUNTER — Ambulatory Visit (INDEPENDENT_AMBULATORY_CARE_PROVIDER_SITE_OTHER): Payer: Commercial Managed Care - HMO | Admitting: Cardiovascular Disease

## 2016-01-25 VITALS — BP 148/80 | HR 66 | Ht 72.0 in | Wt 244.8 lb

## 2016-01-25 DIAGNOSIS — I2510 Atherosclerotic heart disease of native coronary artery without angina pectoris: Secondary | ICD-10-CM | POA: Diagnosis not present

## 2016-01-25 MED ORDER — NITROGLYCERIN 0.4 MG SL SUBL: 0.4000 mg | SUBLINGUAL_TABLET | SUBLINGUAL | Status: DC | PRN

## 2016-01-25 MED ORDER — ISOSORBIDE MONONITRATE ER 30 MG PO TB24: 30.0000 mg | ORAL_TABLET | Freq: Every day | ORAL | Status: DC

## 2016-01-25 NOTE — Progress Notes (Signed)
Cardiology Office Note Date:  01/25/2016   ID:  Robert [name], Robert [name] [birth year], MRN [id number]  PCP:  Cathlean Cower, MD  Cardiologist:  Sherren Mocha, MD    Chief Complaint  Patient presents with  . coronary arthersclerosis    no cp,sob,lee,or claudication     History of Present Illness: Robert [name] is a 70 y.o. male who presents for FU of CAD. The patient had multivessel CABG in 1989. He has also undergone multiple PCI procedures. His last heart catheterization in 2014 demonstrated stable coronary anatomy with continued patency of the LIMA to LAD and vein graft RCA. The patient's native left circumflex had a patent stent site. His LVEF was estimated at 60%.  The patient is doing well from a cardiac perspective. Today, he denies symptoms of palpitations, chest pain, shortness of breath, orthopnea, PND, lower extremity edema, dizziness, or syncope. He plays golf on a regular basis. He doesn't get much additional exercise. He has no exertional symptoms.    Past Medical History  Diagnosis Date  . ALLERGIC RHINITIS 08/20/2007  . BENIGN PROSTATIC HYPERTROPHY 08/20/2007  . CAD (coronary artery disease)     a. s/p CABG x 3 in 1989;  b. MI in 1995 with BMS to VG->RCA;  c. 07/2000 PCI/BMS to native LCX;  d. Known CTO of VG->Diag;  e. Mult caths w/ stable anatomy;  f. 04/2013 Cath: LM nl, LAD 100, LCX 10%isr, 91m, OM1/2/3 min irregs, RI min irregs, RCA 80-90p/194m, VG->RCA 20-30isr, LIMA->LAD ok, EF 60%-->Med Rx.  . ELBOW PAIN, LEFT 04/15/2008  . ERECTILE DYSFUNCTION 08/20/2007  . HYPERLIPIDEMIA TYPE I / IV 09/03/2008  . HYPERTENSION 08/20/2007  . INSOMNIA-SLEEP DISORDER-UNSPEC 03/17/2008  . PREMATURE VENTRICULAR CONTRACTIONS 08/20/2007  . SCROTAL MASS 10/09/2008  . URINARY INCONTINENCE, MALE 10/09/2008  . UTI 04/24/2009    "just this once" (05/15/2013)  . History of meningitis     2nd grade, 8th grade, 9th grade, age 37  . Microcytosis   . OSA on CPAP   . Chronic lower back pain   .  Anginal pain [facility]) May 13 2013    cath done=everything ok  . Myocardial infarction [facility]) June 1995  . Asthma     "very slight"   . GERD (gastroesophageal reflux disease)   . Arthritis     "back; knees; shoulders"     Past Surgical History  Procedure Laterality Date  . Transurethral resection of prostate  10/02/2009    Gyrus transurethral resection   . Cystoscopy  08/07/2009    and right hydrocelectomy  . Colonoscopy w/ biopsies and polypectomy  11/20/2003    adenomatous polyp, internal and external hemorrhoids  . Foot tendon surgery Left 2012    "donor tendon"   . Shoulder open rotator cuff repair Right 2009    Gioffre  . Ankle fracture surgery Right 1984  . Surgery scrotal / testicular Right 2012    "removed the sac" (05/15/2013)  . Coronary artery bypass graft  1989    triple bypass [facility]  . Inguinal hernia repair Bilateral 1970's    "2 on one side; 1 on the other"   . Tonsillectomy and adenoidectomy  ~1961  . Cardiac catheterization      several  . Coronary angioplasty  95, 98, 2000, 2001  . Hemi-microdiscectomy lumbar laminectomy level 1 Right 09/18/2013    Procedure: CENTRAL DECOMPRESSION @ L4-5 FOR SPINAL STENOSIS. EXCISION OF SYNOVIAL CYST @ L4-5 RIGHT, MICRODISCECTOMY @L4 -5 RIGHT ;  Surgeon: Tobi Bastos, MD;  Location: [facility];  Service: Orthopedics;  Laterality: Right;  . Left heart catheterization with coronary angiogram Bilateral 05/15/2013    Procedure: LEFT HEART CATHETERIZATION WITH CORONARY ANGIOGRAM;  Surgeon: Wellington Hampshire, MD;  Location: [place] CATH LAB;  Service: Cardiovascular;  Laterality: Bilateral;    Current Outpatient Prescriptions  Medication Sig Dispense Refill  . albuterol (PROAIR HFA) 108 (90 BASE) MCG/ACT inhaler Inhale 2 puffs into the lungs every 6 (six) hours as needed for wheezing or shortness of breath.     Marland Kitchen aspirin 81 MG tablet Take 81 mg by mouth daily.     Marland Kitchen atenolol (TENORMIN) 25 MG tablet Take 25 mg by mouth at bedtime.    .  clopidogrel (PLAVIX) 75 MG tablet TAKE 1 TABLET (75 MG TOTAL) BY MOUTH DAILY. 90 tablet 0  . isosorbide mononitrate (IMDUR) 30 MG 24 hr tablet Take 1 tablet (30 mg total) by mouth daily. 90 tablet 3  . lisinopril (PRINIVIL,ZESTRIL) 10 MG tablet TAKE 1 TABLET (10 MG TOTAL) BY MOUTH AT BEDTIME. 90 tablet 0  . Multiple Vitamin (MULTIVITAMIN WITH MINERALS) TABS tablet Take 1 tablet by mouth every morning.    . nitroGLYCERIN (NITROSTAT) 0.4 MG SL tablet Place 1 tablet (0.4 mg total) under the tongue every 5 (five) minutes as needed for chest pain. 25 tablet 2  . omeprazole (PRILOSEC) 20 MG capsule Take 20 mg by mouth daily.    . simvastatin (ZOCOR) 80 MG tablet Take 0.5 tablets (40 mg total) by mouth at bedtime. 90 tablet 3  . sulindac (CLINORIL) 200 MG tablet Take 200 mg by mouth 2 (two) times daily.     . temazepam (RESTORIL) 30 MG capsule Take 30 mg by mouth at bedtime as needed for sleep.    Marland Kitchen amitriptyline (ELAVIL) 25 MG tablet Take 25 mg by mouth at bedtime. Reported on 01/25/2016     No current facility-administered medications for this visit.    Allergies:   Metoprolol   Social History:  The patient  reports that he quit smoking about 21 years ago. His smoking use included Cigarettes. He has a 45 pack-year smoking history. He has never used smokeless tobacco. He reports that he does not drink alcohol or use illicit drugs.   Family History:  The patient's  family history includes Coronary artery disease in his father and mother; Esophageal cancer in his brother; Heart disease in his brother.    ROS:  Please see the history of present illness.  Otherwise, review of systems is positive for muscle pain.  All other systems are reviewed and negative.    PHYSICAL EXAM: VS:  BP 148/80 mmHg  Pulse 66  Ht 6' (1.829 m)  Wt 244 lb 12.8 oz (111.041 kg)  BMI 33.19 kg/m2 , BMI Body mass index is 33.19 kg/(m^2). GEN: Well nourished, well developed, in no acute distress HEENT: normal Neck: no JVD, no  masses. No carotid bruits Cardiac: RRR without murmur or gallop                Respiratory:  clear to auscultation bilaterally, normal work of breathing GI: soft, nontender, nondistended, + BS MS: no deformity or atrophy Ext: no pretibial edema, pedal pulses 2+= bilaterally Skin: warm and dry, no rash Neuro:  Strength and sensation are intact Psych: euthymic mood, full affect  EKG:  EKG is ordered today. The ekg ordered today shows normal sinus rhythm 69 bpm, age indeterminate inferior infarct, no significant change from previous tracings  Recent Labs: No  results found for requested labs within last 365 days.   Lipid Panel     Component Value Date/Time   CHOL 154 05/15/2013 0810   TRIG 76 05/15/2013 0810   HDL 48 05/15/2013 0810   CHOLHDL 3.2 05/15/2013 0810   VLDL 15 05/15/2013 0810   LDLCALC 91 05/15/2013 0810      Wt Readings from Last 3 Encounters:  01/25/16 244 lb 12.8 oz (111.041 kg)  09/29/15 247 lb (112.038 kg)  06/30/15 241 lb (109.317 kg)    ASSESSMENT AND PLAN: 1.  CAD, native vessel, without symptoms of angina: The patient is status post remote CABG and multiple PCI procedures. He is maintained on long-term dual antiplatelet therapy without bleeding problems. Cardiac risk factors have been well controlled. We discussed diet, exercise, and other lifestyle recommendations. He will follow-up in one year.  2. Essential hypertension: Blood pressure is repeated and is 136/86 on my check. We discussed sodium restriction and lifestyle modification. No medicine changes are made today. He is treated with atenolol and lisinopril  3. Hyperlipidemia: Lipids are followed through the [place] system. Patient is treated with simvastatin 40 mg.  Current medicines are reviewed with the patient today.  The patient does not have concerns regarding medicines.  Labs/ tests ordered today include:   Orders Placed This Encounter  Procedures  . EKG 12-Lead   Disposition:   FU one  year  Signed, Sherren Mocha, MD  01/25/2016 1:11 PM    [facility] [address], [place], [place]  [address] Phone: [phone]; Fax: [phone]

## 2016-01-25 NOTE — Patient Instructions (Signed)

## 2016-03-23 ENCOUNTER — Other Ambulatory Visit: Payer: Self-pay | Admitting: Internal Medicine

## 2016-04-01 ENCOUNTER — Inpatient Hospital Stay (HOSPITAL_COMMUNITY)
Admission: EM | Admit: 2016-04-01 | Discharge: 2016-04-02 | DRG: 247 | Disposition: A | Discharge status: Home / Self Care | Payer: PRIVATE HEALTH INSURANCE | Attending: Cardiology | Admitting: Cardiology

## 2016-04-01 ENCOUNTER — Emergency Department (HOSPITAL_COMMUNITY): Payer: PRIVATE HEALTH INSURANCE

## 2016-04-01 ENCOUNTER — Encounter (HOSPITAL_COMMUNITY)
Admission: EM | Disposition: A | Discharge status: Home / Self Care | Payer: Self-pay | Source: Home / Self Care | Attending: Cardiology

## 2016-04-01 ENCOUNTER — Encounter (HOSPITAL_COMMUNITY): Payer: Self-pay | Admitting: *Deleted

## 2016-04-01 DIAGNOSIS — G4733 Obstructive sleep apnea (adult) (pediatric): Secondary | ICD-10-CM | POA: Diagnosis present

## 2016-04-01 DIAGNOSIS — Z951 Presence of aortocoronary bypass graft: Secondary | ICD-10-CM

## 2016-04-01 DIAGNOSIS — I25110 Atherosclerotic heart disease of native coronary artery with unstable angina pectoris: Secondary | ICD-10-CM | POA: Diagnosis not present

## 2016-04-01 DIAGNOSIS — T82855A Stenosis of coronary artery stent, initial encounter: Secondary | ICD-10-CM | POA: Diagnosis present

## 2016-04-01 DIAGNOSIS — I252 Old myocardial infarction: Secondary | ICD-10-CM | POA: Diagnosis not present

## 2016-04-01 DIAGNOSIS — J45909 Unspecified asthma, uncomplicated: Secondary | ICD-10-CM | POA: Diagnosis present

## 2016-04-01 DIAGNOSIS — I209 Angina pectoris, unspecified: Secondary | ICD-10-CM | POA: Diagnosis not present

## 2016-04-01 DIAGNOSIS — E785 Hyperlipidemia, unspecified: Secondary | ICD-10-CM | POA: Diagnosis not present

## 2016-04-01 DIAGNOSIS — M545 Low back pain: Secondary | ICD-10-CM | POA: Diagnosis present

## 2016-04-01 DIAGNOSIS — I25119 Atherosclerotic heart disease of native coronary artery with unspecified angina pectoris: Secondary | ICD-10-CM | POA: Diagnosis present

## 2016-04-01 DIAGNOSIS — Z7902 Long term (current) use of antithrombotics/antiplatelets: Secondary | ICD-10-CM

## 2016-04-01 DIAGNOSIS — I10 Essential (primary) hypertension: Secondary | ICD-10-CM | POA: Diagnosis present

## 2016-04-01 DIAGNOSIS — I440 Atrioventricular block, first degree: Secondary | ICD-10-CM | POA: Diagnosis not present

## 2016-04-01 DIAGNOSIS — E781 Pure hyperglyceridemia: Secondary | ICD-10-CM | POA: Diagnosis present

## 2016-04-01 DIAGNOSIS — N400 Enlarged prostate without lower urinary tract symptoms: Secondary | ICD-10-CM | POA: Diagnosis not present

## 2016-04-01 DIAGNOSIS — Z7982 Long term (current) use of aspirin: Secondary | ICD-10-CM | POA: Diagnosis not present

## 2016-04-01 DIAGNOSIS — R079 Chest pain, unspecified: Secondary | ICD-10-CM | POA: Diagnosis not present

## 2016-04-01 DIAGNOSIS — I2510 Atherosclerotic heart disease of native coronary artery without angina pectoris: Secondary | ICD-10-CM

## 2016-04-01 DIAGNOSIS — Z87891 Personal history of nicotine dependence: Secondary | ICD-10-CM

## 2016-04-01 DIAGNOSIS — I200 Unstable angina: Secondary | ICD-10-CM | POA: Diagnosis not present

## 2016-04-01 DIAGNOSIS — Z955 Presence of coronary angioplasty implant and graft: Secondary | ICD-10-CM

## 2016-04-01 DIAGNOSIS — K219 Gastro-esophageal reflux disease without esophagitis: Secondary | ICD-10-CM | POA: Diagnosis present

## 2016-04-01 DIAGNOSIS — G8929 Other chronic pain: Secondary | ICD-10-CM | POA: Diagnosis not present

## 2016-04-01 DIAGNOSIS — I110 Hypertensive heart disease with heart failure: Secondary | ICD-10-CM | POA: Diagnosis not present

## 2016-04-01 DIAGNOSIS — R0602 Shortness of breath: Secondary | ICD-10-CM | POA: Diagnosis not present

## 2016-04-01 HISTORY — DX: Atherosclerotic heart disease of native coronary artery with unspecified angina pectoris: I25.119

## 2016-04-01 HISTORY — PX: CARDIAC CATHETERIZATION: SHX172

## 2016-04-01 LAB — BASIC METABOLIC PANEL
ANION GAP: 9 (ref 5–15)
BUN: 21 mg/dL — AB (ref 6–20)
CALCIUM: 8.9 mg/dL (ref 8.9–10.3)
CO2: 24 mmol/L (ref 22–32)
Chloride: 104 mmol/L (ref 101–111)
Creatinine, Ser: 0.87 mg/dL (ref 0.61–1.24)
GFR calc Af Amer: >60 mL/min (ref >60)
GFR calc non Af Amer: >60 mL/min (ref >60)
GLUCOSE: 144 mg/dL — AB (ref 65–99)
POTASSIUM: 4.2 mmol/L (ref 3.5–5.1)
Sodium: 137 mmol/L (ref 135–145)

## 2016-04-01 LAB — I-STAT TROPONIN, ED
TROPONIN I, POC: 0 ng/mL (ref 0.00–0.08)
Troponin i, poc: 0 ng/mL (ref 0.00–0.08)

## 2016-04-01 LAB — CBC
HCT: 43.1 % (ref 39.0–52.0)
HEMOGLOBIN: 13.9 g/dL (ref 13.0–17.0)
MCH: 25.3 pg — ABNORMAL LOW (ref 26.0–34.0)
MCHC: 32.3 g/dL (ref 30.0–36.0)
MCV: 78.5 fL (ref 78.0–100.0)
Platelets: 176 10*3/uL (ref 150–400)
RBC: 5.49 MIL/uL (ref 4.22–5.81)
RDW: 16.2 % — ABNORMAL HIGH (ref 11.5–15.5)
WBC: 8.3 10*3/uL (ref 4.0–10.5)

## 2016-04-01 LAB — POCT ACTIVATED CLOTTING TIME
ACTIVATED CLOTTING TIME: 219 s
ACTIVATED CLOTTING TIME: 236 s
ACTIVATED CLOTTING TIME: 263 s

## 2016-04-01 LAB — D-DIMER, QUANTITATIVE (NOT AT ARMC)

## 2016-04-01 LAB — TROPONIN I: Troponin I: <0.03 ng/mL (ref <0.03)

## 2016-04-01 SURGERY — LEFT HEART CATH AND CORS/GRAFTS ANGIOGRAPHY

## 2016-04-01 MED ORDER — HEPARIN (PORCINE) IN NACL 2-0.9 UNIT/ML-% IJ SOLN
INTRAMUSCULAR | Status: DC | PRN
  Administered 2016-04-01: 1500 mL

## 2016-04-01 MED ORDER — ONDANSETRON HCL 4 MG/2ML IJ SOLN: 4.0000 mg | Freq: Four times a day (QID) | INTRAMUSCULAR | Status: DC | PRN

## 2016-04-01 MED ORDER — SODIUM CHLORIDE 0.9 % IV SOLN: INTRAVENOUS | Status: AC

## 2016-04-01 MED ORDER — IOPAMIDOL (ISOVUE-370) INJECTION 76%
INTRAVENOUS | Status: AC
  Filled 2016-04-01: qty 125

## 2016-04-01 MED ORDER — FENTANYL CITRATE (PF) 100 MCG/2ML IJ SOLN
INTRAMUSCULAR | Status: DC | PRN
  Administered 2016-04-01: 25 ug via INTRAVENOUS

## 2016-04-01 MED ORDER — ASPIRIN 81 MG PO CHEW
81.0000 mg | CHEWABLE_TABLET | Freq: Every day | ORAL | Status: DC
  Administered 2016-04-02: 81 mg via ORAL
  Filled 2016-04-01: qty 1

## 2016-04-01 MED ORDER — NITROGLYCERIN 2 % TD OINT
1.0000 [in_us] | TOPICAL_OINTMENT | Freq: Four times a day (QID) | TRANSDERMAL | Status: DC
  Administered 2016-04-01: 1 [in_us] via TOPICAL
  Filled 2016-04-01: qty 1

## 2016-04-01 MED ORDER — LISINOPRIL 10 MG PO TABS
10.0000 mg | ORAL_TABLET | Freq: Every day | ORAL | Status: DC
  Administered 2016-04-01 – 2016-04-02 (×2): 10 mg via ORAL
  Filled 2016-04-01 (×2): qty 1

## 2016-04-01 MED ORDER — ASPIRIN 81 MG PO TABS: 81.0000 mg | ORAL_TABLET | Freq: Every day | ORAL | Status: DC

## 2016-04-01 MED ORDER — HEPARIN SODIUM (PORCINE) 1000 UNIT/ML IJ SOLN
INTRAMUSCULAR | Status: DC | PRN
  Administered 2016-04-01: 2000 [IU] via INTRAVENOUS
  Administered 2016-04-01: 3000 [IU] via INTRAVENOUS
  Administered 2016-04-01: 5000 [IU] via INTRAVENOUS
  Administered 2016-04-01: 3000 [IU] via INTRAVENOUS

## 2016-04-01 MED ORDER — IOPAMIDOL (ISOVUE-370) INJECTION 76%
INTRAVENOUS | Status: AC
  Filled 2016-04-01: qty 50

## 2016-04-01 MED ORDER — FENTANYL CITRATE (PF) 100 MCG/2ML IJ SOLN
INTRAMUSCULAR | Status: AC
  Filled 2016-04-01: qty 2

## 2016-04-01 MED ORDER — MIDAZOLAM HCL 2 MG/2ML IJ SOLN
INTRAMUSCULAR | Status: DC | PRN
  Administered 2016-04-01: 2 mg via INTRAVENOUS

## 2016-04-01 MED ORDER — ADENOSINE 12 MG/4ML IV SOLN
INTRAVENOUS | Status: AC
  Filled 2016-04-01: qty 16

## 2016-04-01 MED ORDER — AMITRIPTYLINE HCL 25 MG PO TABS
25.0000 mg | ORAL_TABLET | Freq: Every day | ORAL | Status: DC
  Administered 2016-04-01: 20:00:00 25 mg via ORAL
  Filled 2016-04-01: qty 1

## 2016-04-01 MED ORDER — ACETAMINOPHEN 325 MG PO TABS: 650.0000 mg | ORAL_TABLET | ORAL | Status: DC | PRN

## 2016-04-01 MED ORDER — CLOPIDOGREL BISULFATE 75 MG PO TABS
ORAL_TABLET | ORAL | Status: DC | PRN
  Administered 2016-04-01: 150 mg via ORAL

## 2016-04-01 MED ORDER — ASPIRIN 81 MG PO CHEW: 243.0000 mg | CHEWABLE_TABLET | ORAL | Status: DC

## 2016-04-01 MED ORDER — SODIUM CHLORIDE 0.9% FLUSH: 3.0000 mL | Freq: Two times a day (BID) | INTRAVENOUS | Status: DC

## 2016-04-01 MED ORDER — CLOPIDOGREL BISULFATE 75 MG PO TABS
ORAL_TABLET | ORAL | Status: AC
  Filled 2016-04-01: qty 2

## 2016-04-01 MED ORDER — SODIUM CHLORIDE 0.9% FLUSH: 3.0000 mL | INTRAVENOUS | Status: DC | PRN

## 2016-04-01 MED ORDER — CLOPIDOGREL BISULFATE 75 MG PO TABS
75.0000 mg | ORAL_TABLET | Freq: Every day | ORAL | Status: DC
  Administered 2016-04-02: 75 mg via ORAL
  Filled 2016-04-01: qty 1

## 2016-04-01 MED ORDER — ATENOLOL 50 MG PO TABS
25.0000 mg | ORAL_TABLET | Freq: Every day | ORAL | Status: DC
  Administered 2016-04-01: 20:00:00 25 mg via ORAL
  Filled 2016-04-01: qty 1

## 2016-04-01 MED ORDER — MORPHINE SULFATE (PF) 2 MG/ML IV SOLN: 2.0000 mg | INTRAVENOUS | Status: DC | PRN

## 2016-04-01 MED ORDER — HEPARIN SODIUM (PORCINE) 1000 UNIT/ML IJ SOLN
INTRAMUSCULAR | Status: AC
  Filled 2016-04-01: qty 1

## 2016-04-01 MED ORDER — SULINDAC 200 MG PO TABS
200.0000 mg | ORAL_TABLET | Freq: Two times a day (BID) | ORAL | Status: DC
  Administered 2016-04-02: 200 mg via ORAL
  Filled 2016-04-01: qty 1

## 2016-04-01 MED ORDER — LIDOCAINE HCL (PF) 1 % IJ SOLN
INTRAMUSCULAR | Status: AC
  Filled 2016-04-01: qty 30

## 2016-04-01 MED ORDER — IOPAMIDOL (ISOVUE-300) INJECTION 61%
INTRAVENOUS | Status: AC
  Administered 2016-04-01: 75 mL
  Filled 2016-04-01: qty 75

## 2016-04-01 MED ORDER — ALBUTEROL SULFATE (2.5 MG/3ML) 0.083% IN NEBU: 2.5000 mg | INHALATION_SOLUTION | Freq: Four times a day (QID) | RESPIRATORY_TRACT | Status: DC | PRN

## 2016-04-01 MED ORDER — ISOSORBIDE MONONITRATE ER 30 MG PO TB24
30.0000 mg | ORAL_TABLET | Freq: Every day | ORAL | Status: DC
  Administered 2016-04-02: 09:00:00 30 mg via ORAL
  Filled 2016-04-01: qty 1

## 2016-04-01 MED ORDER — LIDOCAINE HCL (PF) 1 % IJ SOLN
INTRAMUSCULAR | Status: DC | PRN
  Administered 2016-04-01: 2 mL via INTRADERMAL

## 2016-04-01 MED ORDER — NITROGLYCERIN 0.4 MG SL SUBL: 0.4000 mg | SUBLINGUAL_TABLET | SUBLINGUAL | Status: DC | PRN

## 2016-04-01 MED ORDER — ASPIRIN 300 MG RE SUPP: 300.0000 mg | RECTAL | Status: DC

## 2016-04-01 MED ORDER — VERAPAMIL HCL 2.5 MG/ML IV SOLN
INTRAVENOUS | Status: DC | PRN
  Administered 2016-04-01: 10 mL via INTRA_ARTERIAL

## 2016-04-01 MED ORDER — HEPARIN (PORCINE) IN NACL 2-0.9 UNIT/ML-% IJ SOLN
INTRAMUSCULAR | Status: AC
Start: 2016-04-01 — End: 2016-04-01
  Filled 2016-04-01: qty 1500

## 2016-04-01 MED ORDER — NITROGLYCERIN 1 MG/10 ML FOR IR/CATH LAB
INTRA_ARTERIAL | Status: AC
  Filled 2016-04-01: qty 10

## 2016-04-01 MED ORDER — ASPIRIN 81 MG PO CHEW
162.0000 mg | CHEWABLE_TABLET | Freq: Once | ORAL | Status: AC
  Administered 2016-04-01: 162 mg via ORAL
  Filled 2016-04-01: qty 2

## 2016-04-01 MED ORDER — ANGIOPLASTY BOOK
Freq: Once | Status: AC
  Administered 2016-04-01: 20:00:00
  Filled 2016-04-01: qty 1

## 2016-04-01 MED ORDER — MORPHINE SULFATE (PF) 4 MG/ML IV SOLN
4.0000 mg | Freq: Once | INTRAVENOUS | Status: AC
  Administered 2016-04-01: 4 mg via INTRAVENOUS
  Filled 2016-04-01: qty 1

## 2016-04-01 MED ORDER — PANTOPRAZOLE SODIUM 40 MG PO TBEC
40.0000 mg | DELAYED_RELEASE_TABLET | Freq: Every day | ORAL | Status: DC
  Administered 2016-04-02: 09:00:00 40 mg via ORAL
  Filled 2016-04-01: qty 1

## 2016-04-01 MED ORDER — ATORVASTATIN CALCIUM 40 MG PO TABS
40.0000 mg | ORAL_TABLET | Freq: Every day | ORAL | Status: DC
  Administered 2016-04-01: 20:00:00 40 mg via ORAL
  Filled 2016-04-01: qty 1

## 2016-04-01 MED ORDER — ADENOSINE (DIAGNOSTIC) 140MCG/KG/MIN
INTRAVENOUS | Status: DC | PRN
  Administered 2016-04-01: 140 ug/kg/min via INTRAVENOUS

## 2016-04-01 MED ORDER — NITROGLYCERIN 1 MG/10 ML FOR IR/CATH LAB
INTRA_ARTERIAL | Status: DC | PRN
  Administered 2016-04-01: 200 ug via INTRACORONARY

## 2016-04-01 MED ORDER — SODIUM CHLORIDE 0.9 % IV SOLN: 250.0000 mL | INTRAVENOUS | Status: DC | PRN

## 2016-04-01 MED ORDER — TEMAZEPAM 15 MG PO CAPS: 30.0000 mg | ORAL_CAPSULE | Freq: Every evening | ORAL | Status: DC | PRN

## 2016-04-01 MED ORDER — ADULT MULTIVITAMIN W/MINERALS CH
1.0000 | ORAL_TABLET | Freq: Every morning | ORAL | Status: DC
  Administered 2016-04-02: 09:00:00 1 via ORAL
  Filled 2016-04-01: qty 1

## 2016-04-01 MED ORDER — MIDAZOLAM HCL 2 MG/2ML IJ SOLN
INTRAMUSCULAR | Status: AC
  Filled 2016-04-01: qty 2

## 2016-04-01 SURGICAL SUPPLY — 14 items
BALLN EMERGE MR 2.0X12 (BALLOONS) ×3
BALLOON EMERGE MR 2.0X12 (BALLOONS) IMPLANT
CATH INFINITI 5FR MULTPACK ANG (CATHETERS) ×2 IMPLANT
CATH LAUNCHER 5F EBU3.5 (CATHETERS) ×2 IMPLANT
DEVICE RAD COMP TR BAND LRG (VASCULAR PRODUCTS) ×2 IMPLANT
GLIDESHEATH SLEND A-KIT 6F 22G (SHEATH) ×2 IMPLANT
GUIDEWIRE PRESSURE COMET II (WIRE) ×2 IMPLANT
KIT ENCORE 26 ADVANTAGE (KITS) ×2 IMPLANT
KIT HEART LEFT (KITS) ×3 IMPLANT
PACK CARDIAC CATHETERIZATION (CUSTOM PROCEDURE TRAY) ×3 IMPLANT
STENT PROMUS PREM MR 2.5X12 (Permanent Stent) ×2 IMPLANT
TRANSDUCER W/STOPCOCK (MISCELLANEOUS) ×3 IMPLANT
TUBING CIL FLEX 10 FLL-RA (TUBING) ×3 IMPLANT
WIRE SAFE-T 1.5MM-J .035X260CM (WIRE) ×2 IMPLANT

## 2016-04-01 NOTE — Progress Notes (Signed)
Patient on CT table for scan of routine chest with contrast.  Patient was already hooked up to the IV contrast and the technologist was about to start the scan.  Dr. Kathrynn Humble called the department and cancelled scanned he wanted to wait until the cardiologist so the patient first so the patient was brought back to the ED.

## 2016-04-01 NOTE — Interval H&P Note (Signed)
History and Physical Interval Note:  04/01/2016 4:54 PM  Robert [name]  has presented today for surgery, with the diagnosis of cp- UNSTABLE ANGINA.   The various methods of treatment have been discussed with the patient and family. After consideration of risks, benefits and other options for treatment, the patient has consented to  Procedure(s): Left Heart Cath and Coronary Angiography (N/A) With Possible Percutaneous Coronary Intervention as a surgical intervention .  The patient's history has been reviewed, patient examined, no change in status, stable for surgery.  I have reviewed the patient's chart and labs.  Questions were answered to the patient's satisfaction.     Cath Lab Visit (complete for each Cath Lab visit)  Clinical Evaluation Leading to the Procedure:   ACS: Yes.    Non-ACS:    Anginal Classification: CCS III  Anti-ischemic medical therapy: Minimal Therapy (1 class of medications)  Non-Invasive Test Results: No non-invasive testing performed  Prior CABG: Previous CABG  TIMI SCORE  Patient Information:  TIMI Score is 5   A/NSTEMI and high-risk features for short-term risk of death or nonfatal MI Revascularization of the presumed culprit artery  A (9)  Indication: 11; Score: 9 TIMI SCORE  Patient Information:  TIMI Score is 5   A/NSTEMI and high-risk features for short-term risk of death or nonfatal MI Revascularization of multiple coronary arteries when the culprit artery cannot clearly be determined  A (9)  Indication: 12; Score: 9     Robert [name]

## 2016-04-01 NOTE — ED Notes (Signed)
Report given to 3W bed 33 RN.  Will transport to cath lab then will be taken to floor from there.  Patient stable and able to be transported at this time

## 2016-04-01 NOTE — ED Notes (Signed)
Pt taken to Xray.

## 2016-04-01 NOTE — ED Provider Notes (Signed)
CSN: [id number]     Arrival date & time 04/01/16  [id number] History   First MD Initiated Contact with Patient 04/01/16 [phone]     Chief Complaint  Patient presents with  . Chest Pain     (Consider location/radiation/quality/duration/timing/severity/associated sxs/prior Treatment) HPI Comments: 70 yo male with PMH of CAD s/p 3v CABG in 1989 followed up with multiple stenting. Pt comes in with chest pain that started yday. He has 2 pains, L sided chest tightness that is constant and similar to his CAD pain. Pain is not exertional, reproducible and is not radiating. He also has another pain on the L lower thoracic region that is worse with deep inspiration. Pt has some feeling of dib. No nausea, sweating. Pt's pain got worse at 4 am.   ROS 10 Systems reviewed and are negative for acute change except as noted in the HPI.     The history is provided by the patient.    Past Medical History  Diagnosis Date  . ALLERGIC RHINITIS 08/20/2007  . BENIGN PROSTATIC HYPERTROPHY 08/20/2007  . CAD (coronary artery disease)     a. s/p CABG x 3 in 1989;  b. MI in 1995 with BMS to VG->RCA;  c. 07/2000 PCI/BMS to native LCX;  d. Known CTO of VG->Diag;  e. Mult caths w/ stable anatomy;  f. 04/2013 Cath: LM nl, LAD 100, LCX 10%isr, 83m, OM1/2/3 min irregs, RI min irregs, RCA 80-90p/166m, VG->RCA 20-30isr, LIMA->LAD ok, EF 60%-->Med Rx.  . ELBOW PAIN, LEFT 04/15/2008  . ERECTILE DYSFUNCTION 08/20/2007  . HYPERLIPIDEMIA TYPE I / IV 09/03/2008  . HYPERTENSION 08/20/2007  . INSOMNIA-SLEEP DISORDER-UNSPEC 03/17/2008  . PREMATURE VENTRICULAR CONTRACTIONS 08/20/2007  . SCROTAL MASS 10/09/2008  . URINARY INCONTINENCE, MALE 10/09/2008  . UTI 04/24/2009    "just this once" (05/15/2013)  . History of meningitis     2nd grade, 8th grade, 9th grade, age 40  . Microcytosis   . OSA on CPAP   . Chronic lower back pain   . Anginal pain [facility]) May 13 2013    cath done=everything ok  . Myocardial infarction [facility]) June 1995  .  Asthma     "very slight"   . GERD (gastroesophageal reflux disease)   . Arthritis     "back; knees; shoulders"    Past Surgical History  Procedure Laterality Date  . Transurethral resection of prostate  10/02/2009    Gyrus transurethral resection   . Cystoscopy  08/07/2009    and right hydrocelectomy  . Colonoscopy w/ biopsies and polypectomy  11/20/2003    adenomatous polyp, internal and external hemorrhoids  . Foot tendon surgery Left 2012    "donor tendon"   . Shoulder open rotator cuff repair Right 2009    Gioffre  . Ankle fracture surgery Right 1984  . Surgery scrotal / testicular Right 2012    "removed the sac" (05/15/2013)  . Coronary artery bypass graft  1989    triple bypass [facility]  . Inguinal hernia repair Bilateral 1970's    "2 on one side; 1 on the other"   . Tonsillectomy and adenoidectomy  ~1961  . Cardiac catheterization      several  . Coronary angioplasty  95, 98, 2000, 2001  . Hemi-microdiscectomy lumbar laminectomy level 1 Right 09/18/2013    Procedure: CENTRAL DECOMPRESSION @ L4-5 FOR SPINAL STENOSIS. EXCISION OF SYNOVIAL CYST @ L4-5 RIGHT, MICRODISCECTOMY @L4 -5 RIGHT ;  Surgeon: Tobi Bastos, MD;  Location: [facility];  Service: Orthopedics;  Laterality:  Right;  . Left heart catheterization with coronary angiogram Bilateral 05/15/2013    Procedure: LEFT HEART CATHETERIZATION WITH CORONARY ANGIOGRAM;  Surgeon: Wellington Hampshire, MD;  Location: [facility] CATH LAB;  Service: Cardiovascular;  Laterality: Bilateral;   Family History  Problem Relation Age of Onset  . Heart disease Brother   . Esophageal cancer Brother   . Coronary artery disease Father   . Coronary artery disease Mother     pacemaker   Social History  Substance Use Topics  . Smoking status: Former Smoker -- 1.00 packs/day for 45 years    Types: Cigarettes    Quit date: 04/23/1994  . Smokeless tobacco: Never Used  . Alcohol Use: No     Comment:  "I've been quit drinking since 1989"    Review of  Systems    Allergies  Metoprolol  Home Medications   Prior to Admission medications   Medication Sig Start Date End Date Taking? Authorizing Provider  albuterol (PROAIR HFA) 108 (90 BASE) MCG/ACT inhaler Inhale 2 puffs into the lungs every 6 (six) hours as needed for wheezing or shortness of breath.    Yes Historical Provider, MD  amitriptyline (ELAVIL) 25 MG tablet Take 25 mg by mouth at bedtime. Reported on 01/25/2016   Yes Historical Provider, MD  aspirin 81 MG tablet Take 81 mg by mouth daily.    Yes Historical Provider, MD  atenolol (TENORMIN) 25 MG tablet Take 25 mg by mouth at bedtime.   Yes Historical Provider, MD  clopidogrel (PLAVIX) 75 MG tablet TAKE 1 TABLET (75 MG TOTAL) BY MOUTH DAILY. 11/04/15  Yes Biagio Borg, MD  isosorbide mononitrate (IMDUR) 30 MG 24 hr tablet TAKE 1 TABLET EVERY DAY (NEED MD APPOINTMENT FOR PHYSICAL AND REFILLS) 03/24/16  Yes Biagio Borg, MD  lisinopril (PRINIVIL,ZESTRIL) 10 MG tablet TAKE 1 TABLET (10 MG TOTAL) BY MOUTH AT BEDTIME. 11/04/15  Yes Biagio Borg, MD  Multiple Vitamin (MULTIVITAMIN WITH MINERALS) TABS tablet Take 1 tablet by mouth every morning.   Yes Historical Provider, MD  nitroGLYCERIN (NITROSTAT) 0.4 MG SL tablet Place 1 tablet (0.4 mg total) under the tongue every 5 (five) minutes as needed for chest pain. 01/25/16  Yes Sherren Mocha, MD  omeprazole (PRILOSEC) 20 MG capsule Take 20 mg by mouth daily. 06/18/13  Yes Sherren Mocha, MD  simvastatin (ZOCOR) 80 MG tablet Take 0.5 tablets (40 mg total) by mouth at bedtime. 09/24/14  Yes Biagio Borg, MD  sulindac (CLINORIL) 200 MG tablet Take 200 mg by mouth 2 (two) times daily.    Yes Historical Provider, MD  temazepam (RESTORIL) 30 MG capsule Take 30 mg by mouth at bedtime as needed for sleep.   Yes Historical Provider, MD   BP 110/75 mmHg  Pulse 62  Temp(Src) 97.9 F (36.6 C) (Oral)  Resp 15  Ht 6' (1.829 m)  Wt 240 lb (108.863 kg)  BMI 32.54 kg/m2  SpO2 96% Physical Exam   Constitutional: He is oriented to person, place, and time. He appears well-developed.  HENT:  Head: Normocephalic and atraumatic.  Eyes: Conjunctivae and EOM are normal. Pupils are equal, round, and reactive to light.  Neck: Normal range of motion. Neck supple.  Cardiovascular: Normal rate, regular rhythm and normal heart sounds.   Pulmonary/Chest: Effort normal and breath sounds normal. No respiratory distress. He has no wheezes.  Abdominal: Soft. Bowel sounds are normal. He exhibits no distension. There is no tenderness. There is no rebound and no guarding.  Neurological: He is alert and oriented to person, place, and time.  Skin: Skin is warm.  Nursing note and vitals reviewed.   ED Course  Procedures (including critical care time) Labs Review Labs Reviewed  BASIC METABOLIC PANEL - Abnormal; Notable for the following:    Glucose, Bld 144 (*)    BUN 21 (*)    All other components within normal limits  CBC - Abnormal; Notable for the following:    MCH 25.3 (*)    RDW 16.2 (*)    All other components within normal limits  I-STAT TROPOININ, ED    Imaging Review Dg Chest 2 View  04/01/2016  CLINICAL DATA:  Left-sided chest pain with shortness of breath EXAM: CHEST  2 VIEW COMPARISON:  May 15, 2013 FINDINGS: The interstitium is mildly prominent without frank edema or consolidation, stable. Heart is borderline enlarged with pulmonary vascularity within normal limits. Patient is status post internal mammary bypass grafting. No adenopathy. There is atherosclerotic calcification in the aortic arch. No bone lesions. IMPRESSION: Borderline cardiomegaly. Mild generalized interstitial thickening, stable. No frank edema or consolidation. Apparent stable from prior study. Electronically Signed   By: Lowella Grip III M.D.   On: 04/01/2016 07:55   I have personally reviewed and evaluated these images and lab results as part of my medical decision-making.   EKG  Interpretation   Date/Time:  Friday April 01 2016 06:43:07 EDT Ventricular Rate:  74 PR Interval:    QRS Duration: 109 QT Interval:  388 QTC Calculation: 431 R Axis:   14 Text Interpretation:  Sinus rhythm Consider inferior infarct No acute  changes No significant change since last tracing Confirmed by Yenni Carra,  MD, Zanya Lindo [phone]) on 04/01/2016 7:16:09 AM      MDM   Final diagnoses:  None  Unstable angina Pleuritic chest pain  Differential diagnosis includes: ACS syndrome CHF exacerbation Valvular disorder Myocarditis Pericarditis Pericardial effusion Pneumonia Pleural effusion Pulmonary edema PE Anemia  Clinical concerns for ACS - unstable angina. Ongoing chest pain - so we will get cards involved. Pt took home aspirin 81 mg and plavix last night - 162 mg aspirin given. Nitro paste placed. Pain improved with morphine. Pt has no hx of PE, DVT and denies any exogenous estrogen use, long distance travels or surgery in the past 6 weeks, active cancer, recent immobilization - clinical concerns for PE is extremely low.   Varney Biles, MD 04/02/16 [phone]

## 2016-04-01 NOTE — ED Notes (Signed)
Ptt to ED from home c/o L sided chest pain onset yesterday afternoon. Denies any injury or overexertion. Hx of CABG 28 years ago with 3 stents, last in 2002. Pt describes pain as squeezing, sharp pain with mild back pain and shortness of breath worsening throughout the night. Pt took two nitro today and last night without significant improvement. Pt's cardiologist is Dr.Cooper

## 2016-04-01 NOTE — H&P (Signed)
Patient ID: Robert [name] MRN: [id number], DOB/AGE: 70-Oct-1947   Admit date: 04/01/2016   Primary Physician: Cathlean Cower, MD Primary Cardiologist: Dr. Burt Knack  Pt. Profile:  Robert [name] is a 70 yo male with PMH of CAD s/p 3v CABG LIMA to LAD, SVG to RCA and SVG to [place] (with BMS to RCA 1995, BMS to LCx 07/2000, known occluded SVG to diag since at least '02), HTN, HLD and OSA on CPAP presented with CP since yesterday  Problem List  Past Medical History  Diagnosis Date  . ALLERGIC RHINITIS 08/20/2007  . BENIGN PROSTATIC HYPERTROPHY 08/20/2007  . CAD (coronary artery disease)     a. s/p CABG x 3 in 1989;  b. MI in 1995 with BMS to VG->RCA;  c. 07/2000 PCI/BMS to native LCX;  d. Known CTO of VG->Diag;  e. Mult caths w/ stable anatomy;  f. 04/2013 Cath: LM nl, LAD 100, LCX 10%isr, 32m, OM1/2/3 min irregs, RI min irregs, RCA 80-90p/171m, VG->RCA 20-30isr, LIMA->LAD ok, EF 60%-->Med Rx.  . ELBOW PAIN, LEFT 04/15/2008  . ERECTILE DYSFUNCTION 08/20/2007  . HYPERLIPIDEMIA TYPE I / IV 09/03/2008  . HYPERTENSION 08/20/2007  . INSOMNIA-SLEEP DISORDER-UNSPEC 03/17/2008  . PREMATURE VENTRICULAR CONTRACTIONS 08/20/2007  . SCROTAL MASS 10/09/2008  . URINARY INCONTINENCE, MALE 10/09/2008  . UTI 04/24/2009    "just this once" (05/15/2013)  . History of meningitis     2nd grade, 8th grade, 9th grade, age 16  . Microcytosis   . OSA on CPAP   . Chronic lower back pain   . Anginal pain [facility]) May 13 2013    cath done=everything ok  . Myocardial infarction [facility]) June 1995  . Asthma     "very slight"   . GERD (gastroesophageal reflux disease)   . Arthritis     "back; knees; shoulders"     Past Surgical History  Procedure Laterality Date  . Transurethral resection of prostate  10/02/2009    Gyrus transurethral resection   . Cystoscopy  08/07/2009    and right hydrocelectomy  . Colonoscopy w/ biopsies and polypectomy  11/20/2003    adenomatous polyp, internal and external hemorrhoids  . Foot  tendon surgery Left 2012    "donor tendon"   . Shoulder open rotator cuff repair Right 2009    Gioffre  . Ankle fracture surgery Right 1984  . Surgery scrotal / testicular Right 2012    "removed the sac" (05/15/2013)  . Coronary artery bypass graft  1989    triple bypass [facility]  . Inguinal hernia repair Bilateral 1970's    "2 on one side; 1 on the other"   . Tonsillectomy and adenoidectomy  ~1961  . Cardiac catheterization      several  . Coronary angioplasty  95, 98, 2000, 2001  . Hemi-microdiscectomy lumbar laminectomy level 1 Right 09/18/2013    Procedure: CENTRAL DECOMPRESSION @ L4-5 FOR SPINAL STENOSIS. EXCISION OF SYNOVIAL CYST @ L4-5 RIGHT, MICRODISCECTOMY @L4 -5 RIGHT ;  Surgeon: Tobi Bastos, MD;  Location: [facility];  Service: Orthopedics;  Laterality: Right;  . Left heart catheterization with coronary angiogram Bilateral 05/15/2013    Procedure: LEFT HEART CATHETERIZATION WITH CORONARY ANGIOGRAM;  Surgeon: Wellington Hampshire, MD;  Location: [facility] CATH LAB;  Service: Cardiovascular;  Laterality: Bilateral;     Allergies  Allergies  Allergen Reactions  . Metoprolol Other (See Comments)    REACTION: severe bradycardia    HPI   Robert [name] is a 70 yo male with PMH  of CAD s/p 3v CABG LIMA to LAD, SVG to RCA and SVG to [place] (with BMS to RCA 1995, BMS to LCx 07/2000, known occluded SVG to diag since at least '02), HTN, HLD and OSA on CPAP. He had a myoview on 10/21/2010 that showed EF 56%, apical and septal hypokinesis, normal Myoview. He had a second normal Myoview in September 2013. His last cardiac catheterization on 05/15/2013 showed chronically occluded ostial LAD, 40% mid left circumflex, 80-90% proximal RCA followed by occlusion in mid RCA, patent LIMA to LAD, 20-30% in-stent restenosis in SVG to RCA which is otherwise patent, chronically occluded SVG to diagonal.  Patient has otherwise been doing relatively well, continued to play golf 4 times a week. He was in his usual state  of health until he started having left-sided chest discomfort yesterday afternoon around 4 PM. He says he was playing golf history morning, when asked again regarding onset of chest discomfort he says he may have had a low chest discomfort when he was playing golf. Since 4 PM yesterday, he started having chest pain at rest with SOB. He described it as 2 different types of chest pain. There was a pressure-like sensation over his left chest. He also have a sharp stabbing pain on body rotation and deep inspiration below his left breast. He actually says the sharp stabbing pain is similar to his previous angina.   He has upcoming cruise in November and was worried this might be angina. He had a particularly bad episode around 4 AM this morning that woke him up. Initially states the chest pain occurring intermittently, however later states the chest pressure has been waxing and waning without complete resolution since yesterday around 4 PM. His troponin was negative 2. Otherwise BMP and CBC were normal. Chest x-ray was negative for acute process. EKG showed no acute process, Q wave present in inferior lead which appears to be chronic. His chest discomfort improved with nitroglycerin and morphine. Cardiology has been consulted for chest pain.   Home Medications  Prior to Admission medications   Medication Sig Start Date End Date Taking? Authorizing Provider  albuterol (PROAIR HFA) 108 (90 BASE) MCG/ACT inhaler Inhale 2 puffs into the lungs every 6 (six) hours as needed for wheezing or shortness of breath.    Yes Historical Provider, MD  amitriptyline (ELAVIL) 25 MG tablet Take 25 mg by mouth at bedtime. Reported on 01/25/2016   Yes Historical Provider, MD  aspirin 81 MG tablet Take 81 mg by mouth daily.    Yes Historical Provider, MD  atenolol (TENORMIN) 25 MG tablet Take 25 mg by mouth at bedtime.   Yes Historical Provider, MD  clopidogrel (PLAVIX) 75 MG tablet TAKE 1 TABLET (75 MG TOTAL) BY MOUTH DAILY.  11/04/15  Yes Biagio Borg, MD  isosorbide mononitrate (IMDUR) 30 MG 24 hr tablet TAKE 1 TABLET EVERY DAY (NEED MD APPOINTMENT FOR PHYSICAL AND REFILLS) 03/24/16  Yes Biagio Borg, MD  lisinopril (PRINIVIL,ZESTRIL) 10 MG tablet TAKE 1 TABLET (10 MG TOTAL) BY MOUTH AT BEDTIME. 11/04/15  Yes Biagio Borg, MD  Multiple Vitamin (MULTIVITAMIN WITH MINERALS) TABS tablet Take 1 tablet by mouth every morning.   Yes Historical Provider, MD  nitroGLYCERIN (NITROSTAT) 0.4 MG SL tablet Place 1 tablet (0.4 mg total) under the tongue every 5 (five) minutes as needed for chest pain. 01/25/16  Yes Sherren Mocha, MD  omeprazole (PRILOSEC) 20 MG capsule Take 20 mg by mouth daily. 06/18/13  Yes Sherren Mocha,  MD  simvastatin (ZOCOR) 80 MG tablet Take 0.5 tablets (40 mg total) by mouth at bedtime. 09/24/14  Yes Biagio Borg, MD  sulindac (CLINORIL) 200 MG tablet Take 200 mg by mouth 2 (two) times daily.    Yes Historical Provider, MD  temazepam (RESTORIL) 30 MG capsule Take 30 mg by mouth at bedtime as needed for sleep.   Yes Historical Provider, MD    Family History  Family History  Problem Relation Age of Onset  . Heart disease Brother   . Esophageal cancer Brother   . Coronary artery disease Father   . Coronary artery disease Mother     pacemaker    Social History  Social History   Social History  . Marital Status: Married    Spouse Name: N/A  . Number of Children: 1  . Years of Education: N/A   Occupational History  . 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 Smoking status: Former Smoker -- 1.00 packs/day for 45 years    Types: Cigarettes    Quit date: 04/23/1994  . Smokeless tobacco: Never Used  . Alcohol Use: No     Comment:  "I've been quit drinking since 1989"  . Drug Use: No  . Sexual Activity: Yes   Other Topics Concern  . Not on file   Social History Narrative     Review of Systems General:  No chills, fever, night sweats or  weight changes.  Cardiovascular:  No dyspnea on exertion, edema, orthopnea, palpitations, paroxysmal nocturnal dyspnea. +chest pain Dermatological: No rash, lesions/masses Respiratory: No cough +dyspnea Urologic: No hematuria, dysuria Abdominal:   No nausea, vomiting, diarrhea, bright red blood per rectum, melena, or hematemesis Neurologic:  No visual changes, wkns, changes in mental status. All other systems reviewed and are otherwise negative except as noted above.  Physical Exam  Blood pressure 106/67, pulse 56, temperature 97.9 F (36.6 C), temperature source Oral, resp. rate 14, height 6' (1.829 m), weight 240 lb (108.863 kg), SpO2 96 %.  General: Pleasant, NAD Psych: Normal affect. Neuro: Alert and oriented X 3. Moves all extremities spontaneously. HEENT: Normal  Neck: Supple without bruits or JVD. Lungs:  Resp regular and unlabored, CTA. Heart: RRR no s3, s4, or murmurs. Abdomen: Soft, non-tender, non-distended, BS + x 4.  Extremities: No clubbing, cyanosis or edema. DP/PT/Radials 2+ and equal bilaterally.  Labs  Troponin [facility] Test)  Recent Labs  04/01/16 1130  TROPIPOC 0.00   No results for input(s): CKTOTAL, CKMB, TROPONINI in the last 72 hours. Lab Results  Component Value Date   WBC 8.3 04/01/2016   HGB 13.9 04/01/2016   HCT 43.1 04/01/2016   MCV 78.5 04/01/2016   PLT 176 04/01/2016    Recent Labs Lab 04/01/16 0655  NA 137  K 4.2  CL 104  CO2 24  BUN 21*  CREATININE 0.87  CALCIUM 8.9  GLUCOSE 144*   Lab Results  Component Value Date   CHOL 154 05/15/2013   HDL 48 05/15/2013   LDLCALC 91 05/15/2013   TRIG 76 05/15/2013   Lab Results  Component Value Date   DDIMER <0.27 05/15/2013     Radiology/Studies  Dg Chest 2 View  04/01/2016  CLINICAL DATA:  Left-sided chest pain with shortness of breath EXAM: CHEST  2 VIEW COMPARISON:  May 15, 2013 FINDINGS: The interstitium is mildly prominent without frank edema or consolidation,  stable. Heart  is borderline enlarged with pulmonary vascularity within normal limits. Patient is status post internal mammary bypass grafting. No adenopathy. There is atherosclerotic calcification in the aortic arch. No bone lesions. IMPRESSION: Borderline cardiomegaly. Mild generalized interstitial thickening, stable. No frank edema or consolidation. Apparent stable from prior study. Electronically Signed   By: Lowella Grip III M.D.   On: 04/01/2016 07:55    ECG  Normal sinus rhythm with no significant ST-T wave changes, Q waves present in the inferior lead which appears to be chronic  ASSESSMENT AND PLAN  1. Chest pain: has typical and atypical features, described as 2 different type of pain, the sharp pain under the ribs seems to be musculoskeletal in nature worse with body rotation and deep inspiration. As for the other chest pressure sensation, despite prolonged onset since 4 PM yesterday, his troponin was negative 2.  - Patient seems to be very interested repeat cardiac catheterization, however I think Myoview is also reasonable in this case.  2. CAD s/p CABG x 3 1989 LIMA to LAD, SVG to RCA and SVG to diag(occluded since 2002)  - s/p BMS to RCA 1995, BMS to LCx 07/2000  - cardiac catheterization on 05/15/2013 showed chronically occluded ostial LAD, 40% mid left circumflex, 80-90% proximal RCA followed by occlusion in mid RCA, patent LIMA to LAD, 20-30% in-stent restenosis in SVG to RCA which is otherwise patent, chronically occluded SVG to diagonal.  3. HTN: well controlled on atenolol, lisinopril  4. HLD: continue simvastatin  5. OSA on CPAP: compliant   Signed, Almyra Deforest, PA-C 04/01/2016, 12:12 PM

## 2016-04-02 DIAGNOSIS — I10 Essential (primary) hypertension: Secondary | ICD-10-CM

## 2016-04-02 DIAGNOSIS — I25119 Atherosclerotic heart disease of native coronary artery with unspecified angina pectoris: Secondary | ICD-10-CM

## 2016-04-02 DIAGNOSIS — Z7902 Long term (current) use of antithrombotics/antiplatelets: Secondary | ICD-10-CM

## 2016-04-02 DIAGNOSIS — J45909 Unspecified asthma, uncomplicated: Secondary | ICD-10-CM | POA: Diagnosis not present

## 2016-04-02 DIAGNOSIS — T82855A Stenosis of coronary artery stent, initial encounter: Secondary | ICD-10-CM | POA: Diagnosis not present

## 2016-04-02 DIAGNOSIS — E781 Pure hyperglyceridemia: Secondary | ICD-10-CM | POA: Diagnosis not present

## 2016-04-02 DIAGNOSIS — Z951 Presence of aortocoronary bypass graft: Secondary | ICD-10-CM | POA: Diagnosis not present

## 2016-04-02 DIAGNOSIS — K219 Gastro-esophageal reflux disease without esophagitis: Secondary | ICD-10-CM | POA: Diagnosis not present

## 2016-04-02 DIAGNOSIS — I25110 Atherosclerotic heart disease of native coronary artery with unstable angina pectoris: Secondary | ICD-10-CM | POA: Diagnosis not present

## 2016-04-02 DIAGNOSIS — I440 Atrioventricular block, first degree: Secondary | ICD-10-CM | POA: Diagnosis not present

## 2016-04-02 DIAGNOSIS — E785 Hyperlipidemia, unspecified: Secondary | ICD-10-CM | POA: Diagnosis not present

## 2016-04-02 DIAGNOSIS — G4733 Obstructive sleep apnea (adult) (pediatric): Secondary | ICD-10-CM | POA: Diagnosis not present

## 2016-04-02 DIAGNOSIS — I200 Unstable angina: Secondary | ICD-10-CM | POA: Diagnosis not present

## 2016-04-02 DIAGNOSIS — R079 Chest pain, unspecified: Secondary | ICD-10-CM | POA: Diagnosis not present

## 2016-04-02 LAB — TROPONIN I
Troponin I: 0.03 ng/mL (ref <0.03)
Troponin I: <0.03 ng/mL (ref <0.03)

## 2016-04-02 LAB — CBC
HEMATOCRIT: 45.7 % (ref 39.0–52.0)
HEMOGLOBIN: 14.5 g/dL (ref 13.0–17.0)
MCH: 24.8 pg — ABNORMAL LOW (ref 26.0–34.0)
MCHC: 31.7 g/dL (ref 30.0–36.0)
MCV: 78.3 fL (ref 78.0–100.0)
Platelets: 167 10*3/uL (ref 150–400)
RBC: 5.84 MIL/uL — ABNORMAL HIGH (ref 4.22–5.81)
RDW: 15.9 % — AB (ref 11.5–15.5)
WBC: 5.7 10*3/uL (ref 4.0–10.5)

## 2016-04-02 LAB — BASIC METABOLIC PANEL
Anion gap: 8 (ref 5–15)
BUN: 12 mg/dL (ref 6–20)
CHLORIDE: 106 mmol/L (ref 101–111)
CO2: 25 mmol/L (ref 22–32)
Calcium: 8.8 mg/dL — ABNORMAL LOW (ref 8.9–10.3)
Creatinine, Ser: 0.83 mg/dL (ref 0.61–1.24)
GFR calc Af Amer: >60 mL/min (ref >60)
GFR calc non Af Amer: >60 mL/min (ref >60)
GLUCOSE: 97 mg/dL (ref 65–99)
POTASSIUM: 4.1 mmol/L (ref 3.5–5.1)
SODIUM: 139 mmol/L (ref 135–145)

## 2016-04-02 MED ORDER — ACETAMINOPHEN 325 MG PO TABS: 650.0000 mg | ORAL_TABLET | ORAL | Status: DC | PRN

## 2016-04-02 MED ORDER — PANTOPRAZOLE SODIUM 40 MG PO TBEC: 40.0000 mg | DELAYED_RELEASE_TABLET | Freq: Every day | ORAL | Status: DC

## 2016-04-02 NOTE — Progress Notes (Signed)
CARDIAC REHAB PHASE I   PRE:  Rate/Rhythm: 7 SR  BP:  Supine: 154/88 Sitting:   Standing:    SaO2: 96% RA  MODE:  Ambulation: 600 ft   POST:  Rate/Rhythm: 78 SR  BP:  Supine:   Sitting: 169/88  Standing:    SaO2: 96% RA  [id number] Patient ambulated 600 ft with assist x1 without c/o. BP elevated before and after walk. PCI/stent education completed with pt including, restrictions, risk factor modification, activity progression, and CP, NTG use and calling 911. Heart healthy diet sheet and exercise guidelines given. Discussed Phase 2 cardiac rehab and benefits and participating in a telemetry monitored exercise program, but pt is not interested at this time. Pt states he participated in CR after a previous cardiac event. Brochure given, pt will contact cardiologist if he changes his mind regarding participating in CR.   Seward Carol

## 2016-04-02 NOTE — Progress Notes (Signed)
TR BAND REMOVAL  LOCATION:    left radial  DEFLATED PER PROTOCOL:    Yes.    TIME BAND OFF / DRESSING APPLIED:    23:00   SITE UPON ARRIVAL:    Level 0  SITE AFTER BAND REMOVAL:    Level 0  CIRCULATION SENSATION AND MOVEMENT:    Within Normal Limits   Yes.    COMMENTS:

## 2016-04-02 NOTE — Discharge Instructions (Signed)
Call [facility] at [phone] if any bleeding, swelling or drainage at cath site.  May shower, no tub baths for 48 hours for groin sticks. No lifting over 5 pounds for 3 days.  No Driving for 3 days  Heart Healthy Diet  Do not stop Plavix and asprin.    Call [facility] at [phone] if any bleeding, swelling or drainage at cath site.  May shower, no tub baths for 48 hours for groin sticks. No lifting over 5 pounds for 3 days.  No Driving for 3 days  Heart Healthy Diet  Do not stop Plavix and asprin.    We stopped your Prilosec and changed to Protonix.

## 2016-04-02 NOTE — Discharge Summary (Signed)
Physician Discharge Summary       Patient ID: Robert [name] MRN: [id number] DOB/AGE: 1946-03-26 70 y.o.  Admit date: 04/01/2016 Discharge date: 04/02/2016 Primary Cardiologist:Dr. Burt Knack   Discharge Diagnoses:  Principal Problem:   Coronary artery disease involving native heart with angina pectoris [facility]) Active Problems:   Hyperlipidemia type IV   Essential hypertension   Long term current use of Plavix and aspirin due to coronary artery disease   Unstable angina (HCC)   S/P CABG x 3   Chest pain   Angina pectoris [facility])   Discharged Condition: good  Procedures: cardiac cath 04/01/16 by Dr. Ellyn Hack  Procedures    Coronary Stent Intervention   Intravascular Pressure Wire/FFR Study   Left Heart Cath and Cors/Grafts Angiography    Conclusion    1. Not listed procedures: Fractional flow reserve measurement, Coronary stent intervention 2. : 3. 2nd Mrg lesion, 75% stenosed. Post FFR guided intervention with Promus DES 2.5 mm x 12 mm (2.7 mm), there is a 0% residual stenosis. 4. Widely patent proximal circumflex stent. 5. Ost LAD lesion, 100% stenosed. 6. Widely patent tortuous LIMA-dLAD 7. Prox LAD lesion, 95% stenosed. Mid LAD lesion, 80% stenosed. Both lesions are between D1 and D2 seen via retrograde flow. 8. Ost RCA lesion, 90% stenosed. Mid RCA to Dist RCA lesion, 100% stenosed. - Known occlusion 9. Ostial SVG-RCA stent has roughly 45% ISR. The remainder the graft is patent with minimal disease to a small distal RCA that gives off PDA and PL branches. segment. 10. SVG-Diag was not injected - known to be flush occluded. The nongrafted diagonal branches clearly have compromised flow from retrograde perfusion of the graft LAD 11. Preserved LVEF with normal LVEDP  Angiographically similar coronary artery disease as seen in 2014, however in one view the lesion in the OM 2. To be more significant. Due to his ongoing symptoms of exertional angina, a FFR was performed  indicating FFR of 0.78 that is physiologically significant. Therefore decision was made to proceed with PCI.   Plan:  Overnight admission to post procedure unit. Expected discharge tomorrow. Standard TR band removal.  Continue home dose of Plavix and add back aspirin  Can you other cardiac medications  He will follow-up with Dr. Tyrell Antonio Course:  70 yo male with PMH of CAD s/p 3v CABG LIMA to LAD, SVG to RCA and SVG to [place] (with BMS to RCA 1995, BMS to LCx 07/2000, known occluded SVG to diag since at least '02), HTN, HLD and OSA on CPAP presented with CP since yesterday. Similar to pain prior to CABG.  Pt admitted.  Troponins neg and he underwent cardiac cath with results as above.  Tolerated the stent without complications.  Today he has no complaints.  BP mildly elevated this AM.   Labs stable.   EKG SR with 1st degree AV block. No acute changes.  He is seen by Dr. Meda Coffee and found stable for discharge.  He ambulated with cardiac rehab without problems.  He is on plavix and ASA for the stent. On Lipitor for statin. On BB On ACE We have referred to cardiac rehab but pt may not proceed.       Consults: None  Significant Diagnostic Studies:  BMP Latest Ref Rng 04/02/2016 04/01/2016 09/10/2013  Glucose 65 - 99 mg/dL 97 144(H) 91  BUN 6 - 20 mg/dL 12 21(H) 20  Creatinine 0.61 - 1.24 mg/dL 0.83 0.87 0.74  Sodium 135 - 145  mmol/L 139 137 138  Potassium 3.5 - 5.1 mmol/L 4.1 4.2 3.9  Chloride 101 - 111 mmol/L 106 104 103  CO2 22 - 32 mmol/L 25 24 24   Calcium 8.9 - 10.3 mg/dL 8.8(L) 8.9 8.8   CBC Latest Ref Rng 04/02/2016 04/01/2016 09/10/2013  WBC 4.0 - 10.5 K/uL 5.7 8.3 9.2  Hemoglobin 13.0 - 17.0 g/dL 14.5 13.9 15.0  Hematocrit 39.0 - 52.0 % 45.7 43.1 46.0  Platelets 150 - 400 K/uL 167 176 205     Troponin < 0.03 to 0.03.  DDimer < 0.27    EXAM: CHEST 2 VIEW  COMPARISON: May 15, 2013  FINDINGS: The interstitium is mildly prominent without  frank edema or consolidation, stable. Heart is borderline enlarged with pulmonary vascularity within normal limits. Patient is status post internal mammary bypass grafting. No adenopathy. There is atherosclerotic calcification in the aortic arch. No bone lesions.  IMPRESSION: Borderline cardiomegaly. Mild generalized interstitial thickening, stable. No frank edema or consolidation. Apparent stable from prior study.  Discharge Exam: Blood pressure 162/83, pulse 79, temperature 98 F (36.7 C), temperature source Oral, resp. rate 18, height 6' (1.829 m), weight 240 lb 4.8 oz (109 kg), SpO2 96 %. General:Pleasant affect, NAD Skin:Warm and dry, brisk capillary refill HEENT:normocephalic, sclera clear, mucus membranes moist Neck:supple, no JVD, no bruits  Heart:S1S2 RRR without murmur, gallup, rub or click Lungs:clear without rales, rhonchi, or wheezes [id number], non tender, + BS, do not palpate liver spleen or masses Ext:no lower ext edema, 2+ pedal pulses, 2+ radial pulses Neuro:alert and oriented, MAE, follows commands, + facial symmetry  Disposition: 01-Home or Self Care      Discharge Instructions    Amb Referral to Cardiac Rehabilitation    Complete by:  As directed   Diagnosis:  Coronary Stents            Medication List    STOP taking these medications        omeprazole 20 MG capsule  Commonly known as:  PRILOSEC  Replaced by:  pantoprazole 40 MG tablet      TAKE these medications        acetaminophen 325 MG tablet  Commonly known as:  TYLENOL  Take 2 tablets (650 mg total) by mouth every 4 (four) hours as needed for headache or mild pain.     amitriptyline 25 MG tablet  Commonly known as:  ELAVIL  Take 25 mg by mouth at bedtime. Reported on 01/25/2016     aspirin 81 MG tablet  Take 81 mg by mouth daily.     atenolol 25 MG tablet  Commonly known as:  TENORMIN  Take 25 mg by mouth at bedtime.     clopidogrel 75 MG tablet  Commonly known as:  PLAVIX    TAKE 1 TABLET (75 MG TOTAL) BY MOUTH DAILY.     isosorbide mononitrate 30 MG 24 hr tablet  Commonly known as:  IMDUR  TAKE 1 TABLET EVERY DAY (NEED MD APPOINTMENT FOR PHYSICAL AND REFILLS)     lisinopril 10 MG tablet  Commonly known as:  PRINIVIL,ZESTRIL  TAKE 1 TABLET (10 MG TOTAL) BY MOUTH AT BEDTIME.     multivitamin with minerals Tabs tablet  Take 1 tablet by mouth every morning.     nitroGLYCERIN 0.4 MG SL tablet  Commonly known as:  NITROSTAT  Place 1 tablet (0.4 mg total) under the tongue every 5 (five) minutes as needed for chest pain.     pantoprazole 40 MG tablet  Commonly known as:  PROTONIX  Take 1 tablet (40 mg total) by mouth daily.     PROAIR HFA 108 (90 Base) MCG/ACT inhaler  Generic drug:  albuterol  Inhale 2 puffs into the lungs every 6 (six) hours as needed for wheezing or shortness of breath.     simvastatin 80 MG tablet  Commonly known as:  ZOCOR  Take 0.5 tablets (40 mg total) by mouth at bedtime.     sulindac 200 MG tablet  Commonly known as:  CLINORIL  Take 200 mg by mouth 2 (two) times daily.     temazepam 30 MG capsule  Commonly known as:  RESTORIL  Take 30 mg by mouth at bedtime as needed for sleep.       Follow-up Information    Follow up with Sherren Mocha, MD.   Specialty:  Cardiology   Why:  the office will call with date and time   Contact information:   [address] [address] [phone]      Discharge Instructions: Call [facility] at [phone] if any bleeding, swelling or drainage at cath site.  May shower, no tub baths for 48 hours for groin sticks. No lifting over 5 pounds for 3 days.  No Driving for 3 days  Heart Healthy Diet  Do not stop Plavix and asprin.    Call [facility] at [phone] if any bleeding, swelling or drainage at cath site.  May shower, no tub baths for 48 hours for groin sticks. No lifting over 5 pounds for 3 days.  No  Driving for 3 days  Heart Healthy Diet  Do not stop Plavix and asprin.    We stopped your Prilosec and changed to Protonix.   Signed: Cecilie Kicks Nurse Practitioner-Certified [facility]: [facility] 04/02/2016, 9:31 AM  Time spent on discharge : 30 minutes.     The patient was seen, examined and discussed with Cecilie Kicks, NP and I agree with the above.   A 70 year old male with prior medical history of coronary artery disease and CABG who presented with unstable angina on 04/01/2016 and underwent left cardiac cath with finding of 75% stenosis in the second obtuse marginal branch that was significant on FFR, the patient underwent placement of drug eluting stent into the artery with 0% residual stenosis. The patient also had finding of occluded ostial LAD and distal RCA that were known. He has widely patent LIMA to distal LAD and mild in-stent restenosis in SVG to the RCA. SVG to the day on [place]rie Roof is known to be occluded. His LVEF remains normal with normal filling pressures. The patient is currently chest pain-free, he will be continued on dual antiplatelet therapy with aspirin and Plavix for at least a year, he'll be continued on 80 mg of simvastatin, atenolol, lisinopril and Imdur. His blood pressure today is elevated however he hasn't received his medicines here to go plan for discharge and follow-up in our clinic within next 2 weeks.  Ena Dawley 04/02/2016

## 2016-04-04 ENCOUNTER — Encounter (HOSPITAL_COMMUNITY): Payer: Self-pay | Admitting: Cardiology

## 2016-04-04 ENCOUNTER — Telehealth: Payer: Self-pay | Admitting: *Deleted

## 2016-04-04 NOTE — Telephone Encounter (Signed)
Pt was on TCM list admitted for Coronary artery involving native heart w/angina pectoris. Pt had cardiac cath on 7/14. Was d/c 7/15, and will be f/u w/cardiologist Dr. Burt Knack...Johny Chess

## 2016-04-12 ENCOUNTER — Encounter (INDEPENDENT_AMBULATORY_CARE_PROVIDER_SITE_OTHER): Payer: Self-pay

## 2016-04-12 ENCOUNTER — Encounter: Payer: Self-pay | Admitting: Cardiovascular Disease

## 2016-04-12 ENCOUNTER — Ambulatory Visit (INDEPENDENT_AMBULATORY_CARE_PROVIDER_SITE_OTHER): Payer: Commercial Managed Care - HMO | Admitting: Cardiovascular Disease

## 2016-04-12 VITALS — BP 122/70 | HR 64 | Ht 72.0 in | Wt 240.8 lb

## 2016-04-12 DIAGNOSIS — I25119 Atherosclerotic heart disease of native coronary artery with unspecified angina pectoris: Secondary | ICD-10-CM

## 2016-04-12 DIAGNOSIS — I10 Essential (primary) hypertension: Secondary | ICD-10-CM

## 2016-04-12 NOTE — Patient Instructions (Signed)
Medication Instructions:  Your physician recommends that you continue on your current medications as directed. Please refer to the Current Medication list given to you today.   Labwork: none  Testing/Procedures: none  Follow-Up: Your physician wants you to follow-up in: 1 year with Dr Burt Knack. (July 2018) You will receive a reminder letter in the mail two months in advance. If you don't receive a letter, please call our office to schedule the follow-up appointment.        If you need a refill on your cardiac medications before your next appointment, please call your pharmacy.

## 2016-04-12 NOTE — Progress Notes (Signed)
Cardiology Office Note Date:  04/13/2016   ID:  Krishay, Allert 1945/12/20, MRN [id number]  PCP:  Cathlean Cower, MD  Cardiologist:  Sherren Mocha, MD    No chief complaint on file.    History of Present Illness: Robert [name] is a 70 y.o. male who presents for FU of CAD. The patient had multivessel CABG in 1989. He has also undergone multiple PCI procedures. He underwent heart catheterization in 2014 demonstrated stable coronary anatomy with continued patency of the LIMA to LAD and vein graft RCA. The patient's native left circumflex had a patent stent site. His LVEF was estimated at 60%. He was just seen in May 2017 and was doing well with no symptoms of angina. 1 year follow-up was planned. However, he presented July 14 with symptoms of unstable angina and underwent cardiac catheterization demonstrating progressive disease in obtuse marginal branch of the circumflex. FFR directed PCI was performed and this was uncomplicated. He presents today for hospital follow-up.  The patient feels well since undergoing PCI. He reports increased energy, no recurrence of chest pain, and improvement in his breathing. He has no complaints today.   Past Medical History:  Diagnosis Date  . ALLERGIC RHINITIS 08/20/2007  . Anginal pain [facility]) May 13 2013   cath done=everything ok  . Arthritis    "back; knees; shoulders"  (04/01/2016)  . Asthma    "very slight"   . BENIGN PROSTATIC HYPERTROPHY 08/20/2007  . Coronary artery disease involving native heart with angina pectoris ([place])    a. s/p CABG x 3 in 1989;  b. MI in 1995 with BMS to VG->RCA;  c. 07/2000 PCI/BMS to native LCX;  d. Known CTO of VG->Diag;  e. Mult caths w/ stable anatomy;  f. 04/2013 Cath: LM nl, ostLAD 100, LCX 10%isr, 24m, OM1/2/3 min irregs, RI min irregs, pRCA 80-90%/ 100& mid, VG->RCA ostial Stent w/ 20-30isr, LIMA->LAD ok, Occluded VG-Diag. EF 60%-->Med Rx.  . ELBOW PAIN, LEFT 04/15/2008  . ERECTILE DYSFUNCTION 08/20/2007  .  GERD (gastroesophageal reflux disease)   . History of meningitis    2nd grade, 8th grade, 9th grade, age 34  . HYPERLIPIDEMIA TYPE I / IV 09/03/2008  . HYPERTENSION 08/20/2007  . INSOMNIA-SLEEP DISORDER-UNSPEC 03/17/2008  . Microcytosis   . Myocardial infarction [facility]) June 1995  . OSA on CPAP   . PREMATURE VENTRICULAR CONTRACTIONS 08/20/2007  . SCROTAL MASS 10/09/2008  . URINARY INCONTINENCE, MALE 10/09/2008  . UTI 04/24/2009   "just this once" (05/15/2013)    Past Surgical History:  Procedure Laterality Date  . ANKLE FRACTURE SURGERY Right 1984  . BACK SURGERY    . CARDIAC CATHETERIZATION  X 2  . CARDIAC CATHETERIZATION N/A 04/01/2016   Procedure: Left Heart Cath and Cors/Grafts Angiography;  Surgeon: Leonie Man, MD;  Location: [place] CV LAB;  Service: Cardiovascular;  Laterality: N/A;  . CARDIAC CATHETERIZATION N/A 04/01/2016   Procedure: Intravascular Pressure Wire/FFR Study;  Surgeon: Leonie Man, MD;  Location: [place] CV LAB;  Service: Cardiovascular;  Laterality: N/A;  . CARDIAC CATHETERIZATION N/A 04/01/2016   Procedure: Coronary Stent Intervention;  Surgeon: Leonie Man, MD;  Location: [place] CV LAB;  Service: Cardiovascular;  Laterality: N/A;  . COLONOSCOPY W/ BIOPSIES AND POLYPECTOMY  11/20/2003   adenomatous polyp, internal and external hemorrhoids  . CORONARY ANGIOPLASTY  95, 98, 2000, 2001  . [place]- 04/01/2016   "total of 4 stents counting today" (04/01/2016)  .  CORONARY ARTERY BYPASS GRAFT  1989   triple bypass [facility]  . CYSTOSCOPY  08/07/2009   and right hydrocelectomy  . FOOT TENDON SURGERY Left 2012   "donor tendon"   . FRACTURE SURGERY    . HEMI-MICRODISCECTOMY LUMBAR LAMINECTOMY LEVEL 1 Right 09/18/2013   Procedure: CENTRAL DECOMPRESSION @ L4-5 FOR SPINAL STENOSIS. EXCISION OF SYNOVIAL CYST @ L4-5 RIGHT, MICRODISCECTOMY @L4 -5 RIGHT ;  Surgeon: Tobi Bastos, MD;  Location: [facility];  Service: Orthopedics;   Laterality: Right;  . INGUINAL HERNIA REPAIR Bilateral 1970's   "2 on one side; 1 on the other"   . LEFT HEART CATHETERIZATION WITH CORONARY ANGIOGRAM Bilateral 05/15/2013   Procedure: LEFT HEART CATHETERIZATION WITH CORONARY ANGIOGRAM;  Surgeon: Wellington Hampshire, MD;  Location: [place] CATH LAB;  Service: Cardiovascular;  Laterality: Bilateral;  . SHOULDER OPEN ROTATOR CUFF REPAIR Right 2009   [place]re  . SURGERY SCROTAL / TESTICULAR Right 2012   "removed the sac" (05/15/2013)  . TONSILLECTOMY AND ADENOIDECTOMY  ~1961  . TRANSURETHRAL RESECTION OF PROSTATE  10/02/2009   Gyrus transurethral resection     Current Outpatient Prescriptions  Medication Sig Dispense Refill  . acetaminophen (TYLENOL) 325 MG tablet Take 2 tablets (650 mg total) by mouth every 4 (four) hours as needed for headache or mild pain.    Marland Kitchen albuterol (PROAIR HFA) 108 (90 BASE) MCG/ACT inhaler Inhale 2 puffs into the lungs every 6 (six) hours as needed for wheezing or shortness of breath.     Marland Kitchen amitriptyline (ELAVIL) 25 MG tablet Take 25 mg by mouth at bedtime. Reported on 01/25/2016    . aspirin 81 MG tablet Take 81 mg by mouth daily.     Marland Kitchen atenolol (TENORMIN) 25 MG tablet Take 25 mg by mouth at bedtime.    . clopidogrel (PLAVIX) 75 MG tablet TAKE 1 TABLET (75 MG TOTAL) BY MOUTH DAILY. 90 tablet 0  . isosorbide mononitrate (IMDUR) 30 MG 24 hr tablet TAKE 1 TABLET EVERY DAY (NEED MD APPOINTMENT FOR PHYSICAL AND REFILLS) 90 tablet 0  . lisinopril (PRINIVIL,ZESTRIL) 10 MG tablet TAKE 1 TABLET (10 MG TOTAL) BY MOUTH AT BEDTIME. 90 tablet 0  . Multiple Vitamin (MULTIVITAMIN WITH MINERALS) TABS tablet Take 1 tablet by mouth every morning.    . nitroGLYCERIN (NITROSTAT) 0.4 MG SL tablet Place 1 tablet (0.4 mg total) under the tongue every 5 (five) minutes as needed for chest pain. 25 tablet 2  . simvastatin (ZOCOR) 80 MG tablet Take 0.5 tablets (40 mg total) by mouth at bedtime. 90 tablet 3  . sulindac (CLINORIL) 200 MG tablet Take 200 mg  by mouth 2 (two) times daily.     . temazepam (RESTORIL) 30 MG capsule Take 30 mg by mouth at bedtime as needed for sleep.    Marland Kitchen omeprazole (PRILOSEC) 20 MG capsule Take 1 capsule (20 mg total) by mouth daily.     No current facility-administered medications for this visit.     Allergies:   Metoprolol   Social History:  The patient  reports that he quit smoking about 21 years ago. His smoking use included Cigarettes. He has a 45.00 pack-year smoking history. He has never used smokeless tobacco. He reports that he drinks alcohol. He reports that he does not use drugs.   Family History:  The patient's  family history includes Coronary artery disease in his father and mother; Esophageal cancer in his brother; Heart disease in his brother.    ROS:  Please see the history  of present illness.  Otherwise, review of systems is positive for back pain, muscle pain.  All other systems are reviewed and negative.    PHYSICAL EXAM: VS:  BP 122/70   Pulse 64   Ht 6' (1.829 m)   Wt 240 lb 12.8 oz (109.2 kg)   SpO2 96%   BMI 32.66 kg/m  , BMI Body mass index is 32.66 kg/m. GEN: Well nourished, well developed, in no acute distress  HEENT: normal  Neck: no JVD, no masses. No carotid bruits Cardiac: RRR without murmur or gallop                Respiratory:  clear to auscultation bilaterally, normal work of breathing GI: soft, nontender, nondistended, + BS MS: no deformity or atrophy  Ext: no pretibial edema, pedal pulses 2+= bilaterally Skin: warm and dry, no rash Neuro:  Strength and sensation are intact Psych: euthymic mood, full affect  EKG:  EKG is not ordered today.  Recent Labs: 04/02/2016: BUN 12; Creatinine, Ser 0.83; Hemoglobin 14.5; Platelets 167; Potassium 4.1; Sodium 139   Lipid Panel     Component Value Date/Time   CHOL 154 05/15/2013 0810   TRIG 76 05/15/2013 0810   HDL 48 05/15/2013 0810   CHOLHDL 3.2 05/15/2013 0810   VLDL 15 05/15/2013 0810   LDLCALC 91 05/15/2013 0810       Wt Readings from Last 3 Encounters:  04/12/16 240 lb 12.8 oz (109.2 kg)  04/02/16 240 lb 4.8 oz (109 kg)  01/25/16 244 lb 12.8 oz (111 kg)     Cardiac Studies Reviewed: Cardiac Cath 04-01-2016: Conclusion   1. Not listed procedures: Fractional flow reserve measurement, Coronary stent intervention 2. : 3. 2nd Mrg lesion, 75% stenosed. Post FFR guided intervention with Promus DES 2.5 mm x 12 mm (2.7 mm), there is a 0% residual stenosis. 4. Widely patent proximal circumflex stent. 5. Ost LAD lesion, 100% stenosed. 6. Widely patent tortuous LIMA-dLAD 7. Prox LAD lesion, 95% stenosed. Mid LAD lesion, 80% stenosed. Both lesions are between D1 and D2 seen via retrograde flow. 8. Ost RCA lesion, 90% stenosed. Mid RCA to Dist RCA lesion, 100% stenosed. - Known occlusion 9. Ostial SVG-RCA stent has roughly 45% ISR. The remainder the graft is patent with minimal disease to a small distal RCA that gives off PDA and PL branches. segment. 10. SVG-Diag was not injected - known to be flush occluded. The nongrafted diagonal branches clearly have compromised flow from retrograde perfusion of the graft LAD 11. Preserved LVEF with normal LVEDP   Angiographically similar coronary artery disease as seen in 2014, however in one view the lesion in the OM 2. To be more significant. Due to his ongoing symptoms of exertional angina, a FFR was performed indicating FFR of 0.78 that is physiologically significant. Therefore decision was made to proceed with PCI.  Plan:  Overnight admission to post procedure unit. Expected discharge tomorrow. Standard TR band removal.  Continue home dose of Plavix and add back aspirin  Can you other cardiac medications  He will follow-up with Dr. Burt Knack    ASSESSMENT AND PLAN: 1.  CAD, native vessel, without angina: doing well after recent PCI. Cath films reviewed personally and with patient. Medical program reviewed and will be continued without change. CCS I  symptoms at present.   2. HTN: BP controlled on current Rx.   3. Hyperlipidemia: treated with simvastatin. Recent labs reviewed with LDL 75, HDL 49 mg/dL.  Current medicines are reviewed with  the patient today.  The patient does not have concerns regarding medicines.  Labs/ tests ordered today include:  No orders of the defined types were placed in this encounter.   Disposition:   FU one year  Signed, Sherren Mocha, MD  04/13/2016 11:31 PM    [address], [place], [place]  [id number] Phone: [phone]; Fax: [phone]

## 2016-04-15 ENCOUNTER — Encounter: Payer: Commercial Managed Care - HMO | Admitting: Physician Assistant

## 2016-06-10 ENCOUNTER — Encounter: Payer: Self-pay | Admitting: Internal Medicine

## 2016-06-10 ENCOUNTER — Ambulatory Visit (INDEPENDENT_AMBULATORY_CARE_PROVIDER_SITE_OTHER): Payer: Commercial Managed Care - HMO | Admitting: Internal Medicine

## 2016-06-10 DIAGNOSIS — I10 Essential (primary) hypertension: Secondary | ICD-10-CM | POA: Diagnosis not present

## 2016-06-10 DIAGNOSIS — L84 Corns and callosities: Secondary | ICD-10-CM

## 2016-06-10 DIAGNOSIS — R7302 Impaired glucose tolerance (oral): Secondary | ICD-10-CM

## 2016-06-10 NOTE — Progress Notes (Signed)
Subjective:    Patient ID: Robert [name], male    DOB: 04/29/1946, 70 y.o.   MRN: [id number]  HPI  Here with 1 wk worsening mild to mod pain to left distal plantar foot with a hard tender area without red/sweling/tender, ulcer or fever.  No recent trauma or joint swelling. No prior hx of same. nohting makes better or worse except pain to walk.  Pt denies chest pain, increased sob or doe, wheezing, orthopnea, PND, increased LE swelling, palpitations, dizziness or syncope.   Pt denies polydipsia, polyuria, Past Medical History:  Diagnosis Date  . ALLERGIC RHINITIS 08/20/2007  . Anginal pain [facility]) May 13 2013   cath done=everything ok  . Arthritis    "back; knees; shoulders"  (04/01/2016)  . Asthma    "very slight"   . BENIGN PROSTATIC HYPERTROPHY 08/20/2007  . Coronary artery disease involving native heart with angina pectoris ([place])    a. s/p CABG x 3 in 1989;  b. MI in 1995 with BMS to VG->RCA;  c. 07/2000 PCI/BMS to native LCX;  d. Known CTO of VG->Diag;  e. Mult caths w/ stable anatomy;  f. 04/2013 Cath: LM nl, ostLAD 100, LCX 10%isr, 54m, OM1/2/3 min irregs, RI min irregs, pRCA 80-90%/ 100& mid, VG->RCA ostial Stent w/ 20-30isr, LIMA->LAD ok, Occluded VG-Diag. EF 60%-->Med Rx.  . ELBOW PAIN, LEFT 04/15/2008  . ERECTILE DYSFUNCTION 08/20/2007  . GERD (gastroesophageal reflux disease)   . History of meningitis    2nd grade, 8th grade, 9th grade, age 31  . HYPERLIPIDEMIA TYPE I / IV 09/03/2008  . HYPERTENSION 08/20/2007  . INSOMNIA-SLEEP DISORDER-UNSPEC 03/17/2008  . Microcytosis   . Myocardial infarction [facility]) June 1995  . OSA on CPAP   . PREMATURE VENTRICULAR CONTRACTIONS 08/20/2007  . SCROTAL MASS 10/09/2008  . URINARY INCONTINENCE, MALE 10/09/2008  . UTI 04/24/2009   "just this once" (05/15/2013)   Past Surgical History:  Procedure Laterality Date  . ANKLE FRACTURE SURGERY Right 1984  . BACK SURGERY    . CARDIAC CATHETERIZATION  X 2  . CARDIAC CATHETERIZATION N/A 04/01/2016   Procedure: Left Heart Cath and Cors/Grafts Angiography;  Surgeon: Leonie Man, MD;  Location: [place] CV [facility];  Service: Cardiovascular;  Laterality: N/A;  . CARDIAC CATHETERIZATION N/A 04/01/2016   Procedure: Intravascular Pressure Wire/FFR Study;  Surgeon: Leonie Man, MD;  Location: [place] [facility];  Service: Cardiovascular;  Laterality: N/A;  . CARDIAC CATHETERIZATION N/A 04/01/2016   Procedure: Coronary Stent Intervention;  Surgeon: Leonie Man, MD;  Location: [place] CV [facility];  Service: Cardiovascular;  Laterality: N/A;  . COLONOSCOPY W/ BIOPSIES AND POLYPECTOMY  11/20/2003   adenomatous polyp, internal and external hemorrhoids  . CORONARY ANGIOPLASTY  95, 98, 2000, 2001  . [place]- 04/01/2016   "total of 4 stents counting today" (04/01/2016)  . CORONARY ARTERY BYPASS GRAFT  1989   triple bypass MUSC  . CYSTOSCOPY  08/07/2009   and right hydrocelectomy  . FOOT TENDON SURGERY Left 2012   "donor tendon"   . FRACTURE SURGERY    . HEMI-MICRODISCECTOMY LUMBAR LAMINECTOMY LEVEL 1 Right 09/18/2013   Procedure: CENTRAL DECOMPRESSION @ L4-5 FOR SPINAL STENOSIS. EXCISION OF SYNOVIAL CYST @ L4-5 RIGHT, MICRODISCECTOMY @L4 -5 RIGHT ;  Surgeon: Tobi Bastos, MD;  Location: [facility];  Service: Orthopedics;  Laterality: Right;  . INGUINAL HERNIA REPAIR Bilateral 1970's   "2 on one side; 1 on the other"   . LEFT  HEART CATHETERIZATION WITH CORONARY ANGIOGRAM Bilateral 05/15/2013   Procedure: LEFT HEART CATHETERIZATION WITH CORONARY ANGIOGRAM;  Surgeon: Wellington Hampshire, MD;  Location: [place] [facility];  Service: Cardiovascular;  Laterality: Bilateral;  . SHOULDER OPEN ROTATOR CUFF REPAIR Right 2009   Gioffre  . SURGERY SCROTAL / TESTICULAR Right 2012   "removed the sac" (05/15/2013)  . TONSILLECTOMY AND ADENOIDECTOMY  ~1961  . TRANSURETHRAL RESECTION OF PROSTATE  10/02/2009   Gyrus transurethral resection     reports that he quit smoking about 22  years ago. His smoking use included Cigarettes. He has a 45.00 pack-year smoking history. He has never used smokeless tobacco. He reports that he drinks alcohol. He reports that he does not use drugs. family history includes Coronary artery disease in his father and mother; Esophageal cancer in his brother; Heart disease in his brother. Allergies  Allergen Reactions  . Metoprolol Other (See Comments)    REACTION: severe bradycardia   Current Outpatient Prescriptions on File Prior to Visit  Medication Sig Dispense Refill  . acetaminophen (TYLENOL) 325 MG tablet Take 2 tablets (650 mg total) by mouth every 4 (four) hours as needed for headache or mild pain.    Marland Kitchen albuterol (PROAIR HFA) 108 (90 BASE) MCG/ACT inhaler Inhale 2 puffs into the lungs every 6 (six) hours as needed for wheezing or shortness of breath.     Marland Kitchen amitriptyline (ELAVIL) 25 MG tablet Take 25 mg by mouth at bedtime. Reported on 01/25/2016    . aspirin 81 MG tablet Take 81 mg by mouth daily.     Marland Kitchen atenolol (TENORMIN) 25 MG tablet Take 25 mg by mouth at bedtime.    . clopidogrel (PLAVIX) 75 MG tablet TAKE 1 TABLET (75 MG TOTAL) BY MOUTH DAILY. 90 tablet 0  . isosorbide mononitrate (IMDUR) 30 MG 24 hr tablet TAKE 1 TABLET EVERY DAY (NEED MD APPOINTMENT FOR PHYSICAL AND REFILLS) 90 tablet 0  . lisinopril (PRINIVIL,ZESTRIL) 10 MG tablet TAKE 1 TABLET (10 MG TOTAL) BY MOUTH AT BEDTIME. 90 tablet 0  . Multiple Vitamin (MULTIVITAMIN WITH MINERALS) TABS tablet Take 1 tablet by mouth every morning.    . nitroGLYCERIN (NITROSTAT) 0.4 MG SL tablet Place 1 tablet (0.4 mg total) under the tongue every 5 (five) minutes as needed for chest pain. 25 tablet 2  . omeprazole (PRILOSEC) 20 MG capsule Take 1 capsule (20 mg total) by mouth daily.    . simvastatin (ZOCOR) 80 MG tablet Take 0.5 tablets (40 mg total) by mouth at bedtime. 90 tablet 3  . sulindac (CLINORIL) 200 MG tablet Take 200 mg by mouth 2 (two) times daily.     . temazepam (RESTORIL) 30  MG capsule Take 30 mg by mouth at bedtime as needed for sleep.     No current facility-administered medications on file prior to visit.    Review of Systems  Constitutional: Negative for unusual diaphoresis or night sweats HENT: Negative for ear swelling or discharge Eyes: Negative for worsening visual haziness  Respiratory: Negative for choking and stridor.   Gastrointestinal: Negative for distension or worsening eructation Genitourinary: Negative for retention or change in urine volume.  Musculoskeletal: Negative for other MSK pain or swelling Skin: Negative for color change and worsening wound Neurological: Negative for tremors and numbness other than noted  Psychiatric/Behavioral: Negative for decreased concentration or agitation other than above       Objective:   Physical Exam BP 110/70 (BP Location: Left Arm, Patient Position: Sitting, Cuff Size: Normal)  Pulse 68   Temp 98.5 F (36.9 C) (Oral)   Ht 6' (1.829 m)   Wt 232 lb (105.2 kg)   SpO2 93%   BMI 31.46 kg/m  VS noted, not ill appearing Constitutional: Pt appears in no apparent distress HENT: Head: NCAT.  Right Ear: External ear normal.  Left Ear: External ear normal.  Eyes: . Pupils are equal, round, and reactive to light. Conjunctivae and EOM are normal Neck: Normal range of motion. Neck supple.  Cardiovascular: Normal rate and regular rhythm.   Pulmonary/Chest: Effort normal and breath sounds without rales or wheezing.  Neurological: Pt is alert. Not confused , motor grossly intact Skin: Skin is warm. No rash, no LE edema; left distal plantar foot with hard tender corn area at the 5th MTP without ulcer, redness Psychiatric: Pt behavior is normal. No agitation.     Assessment & Plan:

## 2016-06-10 NOTE — Assessment & Plan Note (Signed)
stable overall by history and exam, recent data reviewed with pt, and pt to continue medical treatment as before,  to f/u any worsening symptoms or concerns BP Readings from Last 3 Encounters:  06/10/16 110/70  04/12/16 122/70  04/02/16 (!) 169/88

## 2016-06-10 NOTE — Patient Instructions (Signed)
Please continue all other medications as before, and refills have been done if requested.  Please have the pharmacy call with any other refills you may need.  Please keep your appointments with your specialists as you may have planned  You will be contacted regarding the referral for: podiatry, Dr Paulla Dolly

## 2016-06-10 NOTE — Assessment & Plan Note (Signed)
Mild to mod, declines pain med, for podiatry referral, to f/u any worsening symptoms or concerns

## 2016-06-10 NOTE — Progress Notes (Signed)
Pre visit review using our clinic review tool, if applicable. No additional management support is needed unless otherwise documented below in the visit note. 

## 2016-06-10 NOTE — Assessment & Plan Note (Signed)
stable overall by history and exam, recent data reviewed with pt, and pt to continue medical treatment as before,  to f/u any worsening symptoms or concerns Lab Results  Component Value Date   HGBA1C 5.8 (H) 05/15/2013

## 2016-06-20 ENCOUNTER — Ambulatory Visit: Payer: Commercial Managed Care - HMO | Admitting: Podiatry

## 2016-06-30 ENCOUNTER — Encounter: Payer: Self-pay | Admitting: Podiatry

## 2016-06-30 ENCOUNTER — Ambulatory Visit (INDEPENDENT_AMBULATORY_CARE_PROVIDER_SITE_OTHER): Payer: Commercial Managed Care - HMO | Admitting: Podiatry

## 2016-06-30 VITALS — BP 123/70 | HR 57 | Resp 16 | Ht 72.0 in | Wt 230.0 lb

## 2016-06-30 DIAGNOSIS — M216X9 Other acquired deformities of unspecified foot: Secondary | ICD-10-CM | POA: Diagnosis not present

## 2016-06-30 DIAGNOSIS — M779 Enthesopathy, unspecified: Secondary | ICD-10-CM

## 2016-06-30 DIAGNOSIS — L84 Corns and callosities: Secondary | ICD-10-CM | POA: Diagnosis not present

## 2016-06-30 MED ORDER — TRIAMCINOLONE ACETONIDE 10 MG/ML IJ SUSP
10.0000 mg | Freq: Once | INTRAMUSCULAR | Status: AC
  Administered 2016-06-30: 10 mg

## 2016-06-30 NOTE — Progress Notes (Signed)
   Subjective:    Patient ID: Robert [name], male    DOB: 11-03-1945, 70 y.o.   MRN: [id number]  HPI Chief Complaint  Patient presents with  . Painful lesion    Left foot; below 5th toe; pt stated, "Has had for the past 2 months"      Review of Systems  All other systems reviewed and are negative.      Objective:   Physical Exam        Assessment & Plan:

## 2016-06-30 NOTE — Progress Notes (Signed)
Subjective:     Patient ID: Robert [name], male   DOB: 10/03/1945, 70 y.o.   MRN: [id number]  HPI patient presents stating that he's been having a lot of pain in his left foot below the metatarsal and it's making it hard for him to walk comfortably   Review of Systems  All other systems reviewed and are negative.      Objective:   Physical Exam  Constitutional: He is oriented to person, place, and time.  Cardiovascular: Intact distal pulses.   Musculoskeletal: Normal range of motion.  Neurological: He is oriented to person, place, and time.  Skin: Skin is warm.  Nursing note and vitals reviewed.  neurovascular status intact muscle strength adequate range of motion within normal limits with patient found have inflammation and pain around the fifth metatarsal head left with keratotic lesion formation and fluid buildup noted. Patient states that it is inflammatory and gradually becoming more painful for him and it's been going on for around 6 months     Assessment:     Inflammatory capsulitis fifth MPJ left with keratotic lesion formation    Plan:     H&P x-rays reviewed and careful capsular injection administered fifth MPJ left and debris did lesion. Gave instructions on possible soft type orthotics with dispersion pad around fifth metatarsal left and that we'll be decide in future and patient will be seen back as needed

## 2016-07-19 ENCOUNTER — Other Ambulatory Visit: Payer: Self-pay | Admitting: *Deleted

## 2016-07-19 DIAGNOSIS — I2510 Atherosclerotic heart disease of native coronary artery without angina pectoris: Secondary | ICD-10-CM

## 2016-07-19 MED ORDER — NITROGLYCERIN 0.4 MG SL SUBL: 0.4000 mg | SUBLINGUAL_TABLET | SUBLINGUAL | 0 refills | Status: DC | PRN

## 2016-08-18 ENCOUNTER — Encounter: Payer: Self-pay | Admitting: Podiatry

## 2016-08-18 ENCOUNTER — Ambulatory Visit (INDEPENDENT_AMBULATORY_CARE_PROVIDER_SITE_OTHER): Payer: Commercial Managed Care - HMO | Admitting: Podiatry

## 2016-08-18 VITALS — BP 121/73 | HR 68

## 2016-08-18 DIAGNOSIS — M216X9 Other acquired deformities of unspecified foot: Secondary | ICD-10-CM

## 2016-08-18 DIAGNOSIS — L84 Corns and callosities: Secondary | ICD-10-CM | POA: Diagnosis not present

## 2016-08-18 NOTE — Progress Notes (Signed)
Subjective:     Patient ID: Robert [name], male   DOB: 07/01/1946, 70 y.o.   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

## 2016-09-27 ENCOUNTER — Telehealth: Payer: Self-pay | Admitting: Cardiovascular Disease

## 2016-09-27 DIAGNOSIS — M1712 Unilateral primary osteoarthritis, left knee: Secondary | ICD-10-CM | POA: Diagnosis not present

## 2016-09-27 NOTE — Telephone Encounter (Signed)
Letter was received in the office from pt in regards to Isosorbide MN and Viagra.  The pt has a new provider at the [place] and when he was seen he requested his refill for Viagra. The pt had also provided the [place] with his list of medications which included Isosorbide MN.  Due [facility].  The pt now needs a note from Dr Burt Knack stating that Isosorbide MN was stopped and Viagra can be renewed. I reviewed the pt's medication lists and past office visits and we do not have Viagra listed as a current medication during his visits.  The pt states that he is stopping Isosorbide MN at this time so that he can get Viagra from the [place].  I advised him that he cannot take these medications together due to FDA warning.  Pt verbalized understanding.  If the pt develops angina then he will restart Isosorbide MN, STOP Viagra and contact our office.  Pt agreed with plan.  The pt needs a letter from Dr Burt Knack in regards to this matter and it should be faxed to [phone].

## 2016-09-27 NOTE — Telephone Encounter (Signed)
New message: ° ° ° °Pt returning phone call °

## 2016-09-29 ENCOUNTER — Encounter: Payer: Self-pay | Admitting: Cardiovascular Disease

## 2016-09-29 NOTE — Telephone Encounter (Signed)
Letter completed by Dr Burt Knack and faxed to the [facility] Attn: Brett Canales [place],  [phone]. Isosorbide removed from the pt's medication list.

## 2016-10-03 ENCOUNTER — Telehealth (HOSPITAL_COMMUNITY): Payer: Self-pay | Admitting: Internal Medicine

## 2016-10-03 NOTE — Telephone Encounter (Signed)
Pt refused services, advised [place] not to call.... KJ

## 2016-10-05 ENCOUNTER — Other Ambulatory Visit: Payer: Self-pay | Admitting: Cardiology

## 2016-10-07 NOTE — Telephone Encounter (Signed)
Rx has been sent to the pharmacy electronically. ° °

## 2016-10-11 ENCOUNTER — Ambulatory Visit (INDEPENDENT_AMBULATORY_CARE_PROVIDER_SITE_OTHER): Payer: Medicare HMO | Admitting: Internal Medicine

## 2016-10-11 ENCOUNTER — Ambulatory Visit (INDEPENDENT_AMBULATORY_CARE_PROVIDER_SITE_OTHER)
Admission: RE | Admit: 2016-10-11 | Discharge: 2016-10-11 | Disposition: A | Discharge status: Home / Self Care | Payer: Medicare HMO | Source: Ambulatory Visit | Attending: Internal Medicine | Admitting: Internal Medicine

## 2016-10-11 ENCOUNTER — Encounter: Payer: Self-pay | Admitting: Internal Medicine

## 2016-10-11 VITALS — BP 140/80 | HR 77 | Temp 99.2°F | Resp 20 | Wt 238.0 lb

## 2016-10-11 DIAGNOSIS — R079 Chest pain, unspecified: Secondary | ICD-10-CM | POA: Diagnosis not present

## 2016-10-11 DIAGNOSIS — I110 Hypertensive heart disease with heart failure: Secondary | ICD-10-CM | POA: Diagnosis not present

## 2016-10-11 DIAGNOSIS — R05 Cough: Secondary | ICD-10-CM

## 2016-10-11 DIAGNOSIS — S299XXA Unspecified injury of thorax, initial encounter: Secondary | ICD-10-CM | POA: Diagnosis not present

## 2016-10-11 DIAGNOSIS — F528 Other sexual dysfunction not due to a substance or known physiological condition: Secondary | ICD-10-CM

## 2016-10-11 DIAGNOSIS — R059 Cough, unspecified: Secondary | ICD-10-CM

## 2016-10-11 MED ORDER — SILDENAFIL CITRATE 100 MG PO TABS: 50.0000 mg | ORAL_TABLET | Freq: Every day | ORAL | 11 refills | Status: DC | PRN

## 2016-10-11 NOTE — Progress Notes (Signed)
Subjective:    Patient ID: Robert [name], male    DOB: 08-14-1946, 71 y.o.   MRN: [id number]  HPI   Here with 2-3 days acute onset fever, facial pain, pressure, headache, general weakness and malaise, and clear d/c, with mild ST and non prod cough, but pt denies chest pain, wheezing, increased sob or doe, orthopnea, PND, increased LE swelling, palpitations, dizziness or syncope.  Pt denies new neurological symptoms such as new headache, or facial or extremity weakness or numbness  Fell with walking dog 3 days ago [place], both feet jus went sideways and fell to right side hard, though he might have broken rib as has persitent right breast and lateral CP, pleuritic, mod to severe, worse with inspiration, better with rest.   Also with c/o worsening ED symptoms last 6 mo, asks for viagra trial.  No other new hx Past Medical History:  Diagnosis Date  . ALLERGIC RHINITIS 08/20/2007  . Anginal pain [facility]) May 13 2013   cath done=everything ok  . Arthritis    "back; knees; shoulders"  (04/01/2016)  . Asthma    "very slight"   . BENIGN PROSTATIC HYPERTROPHY 08/20/2007  . Coronary artery disease involving native heart with angina pectoris ([place])    a. s/p CABG x 3 in 1989;  b. MI in 1995 with BMS to VG->RCA;  c. 07/2000 PCI/BMS to native LCX;  d. Known CTO of VG->Diag;  e. Mult caths w/ stable anatomy;  f. 04/2013 Cath: LM nl, ostLAD 100, LCX 10%isr, 70m, OM1/2/3 min irregs, RI min irregs, pRCA 80-90%/ 100& mid, VG->RCA ostial Stent w/ 20-30isr, LIMA->LAD ok, Occluded VG-Diag. EF 60%-->Med Rx.  . ELBOW PAIN, LEFT 04/15/2008  . ERECTILE DYSFUNCTION 08/20/2007  . GERD (gastroesophageal reflux disease)   . History of meningitis    2nd grade, 8th grade, 9th grade, age 71  . HYPERLIPIDEMIA TYPE I / IV 09/03/2008  . HYPERTENSION 08/20/2007  . INSOMNIA-SLEEP DISORDER-UNSPEC 03/17/2008  . Microcytosis   . Myocardial infarction June 1995  . OSA on CPAP   . PREMATURE VENTRICULAR CONTRACTIONS  08/20/2007  . SCROTAL MASS 10/09/2008  . URINARY INCONTINENCE, MALE 10/09/2008  . UTI 04/24/2009   "just this once" (05/15/2013)   Past Surgical History:  Procedure Laterality Date  . ANKLE FRACTURE SURGERY Right 1984  . BACK SURGERY    . CARDIAC CATHETERIZATION  X 2  . CARDIAC CATHETERIZATION N/A 04/01/2016   Procedure: Left Heart Cath and Cors/Grafts Angiography;  Surgeon: Leonie Man, MD;  Location: [place] [facility];  Service: Cardiovascular;  Laterality: N/A;  . CARDIAC CATHETERIZATION N/A 04/01/2016   Procedure: Intravascular Pressure Wire/FFR Study;  Surgeon: Leonie Man, MD;  Location: [place] [facility];  Service: Cardiovascular;  Laterality: N/A;  . CARDIAC CATHETERIZATION N/A 04/01/2016   Procedure: Coronary Stent Intervention;  Surgeon: Leonie Man, MD;  Location: [place] [facility];  Service: Cardiovascular;  Laterality: N/A;  . COLONOSCOPY W/ BIOPSIES AND POLYPECTOMY  11/20/2003   adenomatous polyp, internal and external hemorrhoids  . CORONARY ANGIOPLASTY  95, 98, 2000, 2001  . [place]- 04/01/2016   "total of 4 stents counting today" (04/01/2016)  . CORONARY ARTERY BYPASS GRAFT  1989   triple bypass MUSC  . CYSTOSCOPY  08/07/2009   and right hydrocelectomy  . FOOT TENDON SURGERY Left 2012   "donor tendon"   . FRACTURE SURGERY    . HEMI-MICRODISCECTOMY LUMBAR LAMINECTOMY LEVEL 1 Right 09/18/2013  Procedure: CENTRAL DECOMPRESSION @ L4-5 FOR SPINAL STENOSIS. EXCISION OF SYNOVIAL CYST @ L4-5 RIGHT, MICRODISCECTOMY @L4 -5 RIGHT ;  Surgeon: Tobi Bastos, MD;  Location: [facility];  Service: Orthopedics;  Laterality: Right;  . INGUINAL HERNIA REPAIR Bilateral 1970's   "2 on one side; 1 on the other"   . LEFT HEART CATHETERIZATION WITH CORONARY ANGIOGRAM Bilateral 05/15/2013   Procedure: LEFT HEART CATHETERIZATION WITH CORONARY ANGIOGRAM;  Surgeon: Wellington Hampshire, MD;  Location: [place] CATH LAB;  Service: Cardiovascular;  Laterality:  Bilateral;  . SHOULDER OPEN ROTATOR CUFF REPAIR Right 2009   Gioffre  . SURGERY SCROTAL / TESTICULAR Right 2012   "removed the sac" (05/15/2013)  . TONSILLECTOMY AND ADENOIDECTOMY  ~1961  . TRANSURETHRAL RESECTION OF PROSTATE  10/02/2009   Gyrus transurethral resection     reports that he quit smoking about 22 years ago. His smoking use included Cigarettes. He has a 45.00 pack-year smoking history. He has never used smokeless tobacco. He reports that he drinks alcohol. He reports that he does not use drugs. family history includes Coronary artery disease in his father and mother; Esophageal cancer in his brother; Heart disease in his brother. Allergies  Allergen Reactions  . Metoprolol Other (See Comments)    REACTION: severe bradycardia   Current Outpatient Prescriptions on File Prior to Visit  Medication Sig Dispense Refill  . acetaminophen (TYLENOL) 325 MG tablet Take 2 tablets (650 mg total) by mouth every 4 (four) hours as needed for headache or mild pain.    Marland Kitchen albuterol (PROAIR HFA) 108 (90 BASE) MCG/ACT inhaler Inhale 2 puffs into the lungs every 6 (six) hours as needed for wheezing or shortness of breath.     Marland Kitchen amitriptyline (ELAVIL) 25 MG tablet Take 25 mg by mouth at bedtime. Reported on 01/25/2016    . aspirin 81 MG tablet Take 81 mg by mouth daily.     Marland Kitchen atenolol (TENORMIN) 25 MG tablet Take 25 mg by mouth at bedtime.    . clopidogrel (PLAVIX) 75 MG tablet TAKE 1 TABLET (75 MG TOTAL) BY MOUTH DAILY. 90 tablet 0  . lisinopril (PRINIVIL,ZESTRIL) 10 MG tablet TAKE 1 TABLET (10 MG TOTAL) BY MOUTH AT BEDTIME. 90 tablet 0  . Multiple Vitamin (MULTIVITAMIN WITH MINERALS) TABS tablet Take 1 tablet by mouth every morning.    . nitroGLYCERIN (NITROSTAT) 0.4 MG SL tablet Place 1 tablet (0.4 mg total) under the tongue every 5 (five) minutes as needed for chest pain. 25 tablet 0  . omeprazole (PRILOSEC) 20 MG capsule Take 1 capsule (20 mg total) by mouth daily.    . pantoprazole (PROTONIX) 40  MG tablet TAKE 1 TABLET (40 MG TOTAL) BY MOUTH DAILY. 90 tablet 1  . simvastatin (ZOCOR) 80 MG tablet Take 0.5 tablets (40 mg total) by mouth at bedtime. 90 tablet 3  . sulindac (CLINORIL) 200 MG tablet Take 200 mg by mouth 2 (two) times daily.     . temazepam (RESTORIL) 30 MG capsule Take 30 mg by mouth at bedtime as needed for sleep.     No current facility-administered medications on file prior to visit.    Review of Systems  Constitutional: Negative for unusual diaphoresis or night sweats HENT: Negative for ear swelling or discharge Eyes: Negative for worsening visual haziness  Respiratory: Negative for choking and stridor.   Gastrointestinal: Negative for distension or worsening eructation Genitourinary: Negative for retention or change in urine volume.  Musculoskeletal: Negative for other MSK pain or swelling Skin: Negative  for color change and worsening wound Neurological: Negative for tremors and numbness other than noted  Psychiatric/Behavioral: Negative for decreased concentration or agitation other than above   All other system neg per pt    Objective:   Physical Exam BP 140/80   Pulse 77   Temp 99.2 F (37.3 C) (Oral)   Resp 20   Wt 238 lb (108 kg)   SpO2 92%   BMI 32.28 kg/m  VS noted,  Constitutional: Pt appears in no apparent distress HENT: Head: NCAT.  Right Ear: External ear normal.  Left Ear: External ear normal.  Bilat tm's with mild erythema.  Max sinus areas non tender.  Pharynx with mild erythema, no exudate Eyes: . Pupils are equal, round, and reactive to light. Conjunctivae and EOM are normal Neck: Normal range of motion. Neck supple.  Cardiovascular: Normal rate and regular rhythm.   Pulmonary/Chest: Effort normal and breath sounds without rales or wheezing.  Neurological: Pt is alert. Not confused , motor grossly intact Skin: Skin is warm. No rash, no LE edema Psychiatric: Pt behavior is normal. No agitation.  No  Other new exam findings      Assessment & Plan:

## 2016-10-11 NOTE — Progress Notes (Signed)
Pre visit review using our clinic review tool, if applicable. No additional management support is needed unless otherwise documented below in the visit note. 

## 2016-10-11 NOTE — Patient Instructions (Signed)
Please go to the XRAY Department in the Basement (go straight as you get off the elevator) for the x-ray testing  Please continue all other medications as before, and refills have been done if requested - the viagra  You can also take Delsym OTC for cough, and/or Mucinex (or it's generic off brand) for congestion, and tylenol as needed for pain.  Please have the pharmacy call with any other refills you may need.  Please keep your appointments with your specialists as you may have planned

## 2016-10-12 ENCOUNTER — Telehealth: Payer: Self-pay | Admitting: Internal Medicine

## 2016-10-12 ENCOUNTER — Ambulatory Visit: Payer: Commercial Managed Care - HMO | Admitting: Internal Medicine

## 2016-10-12 NOTE — Telephone Encounter (Signed)
Patient called in wanting x ray results.  Gave patient MD response.

## 2016-10-17 DIAGNOSIS — R059 Cough, unspecified: Secondary | ICD-10-CM | POA: Insufficient documentation

## 2016-10-17 DIAGNOSIS — R05 Cough: Secondary | ICD-10-CM | POA: Insufficient documentation

## 2016-10-17 NOTE — Assessment & Plan Note (Signed)
C/w likely viral URI, for cxr as above,  Cough med prn, to f/u any worsening symptoms or concerns

## 2016-10-17 NOTE — Assessment & Plan Note (Signed)
stable overall by history and exam, recent data reviewed with pt, and pt to continue medical treatment as before,  to f/u any worsening symptoms or concerns BP Readings from Last 3 Encounters:  10/11/16 140/80  08/18/16 121/73  06/30/16 123/70

## 2016-10-17 NOTE — Assessment & Plan Note (Signed)
Atypical etiology unclear, onset after fall, for cxr/rib films, pain control, to f/u any worsening symptoms or concerns

## 2016-10-17 NOTE — Assessment & Plan Note (Signed)
Ok for trial viagra prn

## 2017-01-26 ENCOUNTER — Telehealth: Payer: Self-pay | Admitting: Internal Medicine

## 2017-01-26 ENCOUNTER — Encounter: Payer: Self-pay | Admitting: Cardiovascular Disease

## 2017-01-26 NOTE — Telephone Encounter (Signed)
Called patient schedule awv. Pt stated that he receives his wellness from the [place]. Patient stated that he will give office a call back when he is ready to schedule his CPE for the year.

## 2017-02-20 ENCOUNTER — Encounter: Payer: Self-pay | Admitting: Cardiovascular Disease

## 2017-02-20 ENCOUNTER — Ambulatory Visit (INDEPENDENT_AMBULATORY_CARE_PROVIDER_SITE_OTHER): Payer: Medicare HMO | Admitting: Cardiovascular Disease

## 2017-02-20 ENCOUNTER — Other Ambulatory Visit: Payer: Self-pay | Admitting: Cardiology

## 2017-02-20 VITALS — BP 132/80 | HR 67 | Ht 72.0 in | Wt 245.8 lb

## 2017-02-20 DIAGNOSIS — I2510 Atherosclerotic heart disease of native coronary artery without angina pectoris: Secondary | ICD-10-CM | POA: Diagnosis not present

## 2017-02-20 DIAGNOSIS — I10 Essential (primary) hypertension: Secondary | ICD-10-CM | POA: Diagnosis not present

## 2017-02-20 NOTE — Progress Notes (Signed)
Cardiology Office Note Date:  02/20/2017   ID:  Charon, Akamine 11/26/1945, MRN [id number]  PCP:  Biagio Borg, MD  Cardiologist:  Sherren Mocha, MD    Chief Complaint  Patient presents with  . Coronary Artery Disease    follow up     History of Present Illness: Robert [name] is a 71 y.o. male who presents for FU of CAD. The patient had multivessel CABG in 1989. He has also undergone multiple PCI procedures. He underwent heart catheterization in 2014 demonstrated stable coronary anatomy with continued patency of the LIMA to LAD and vein graft RCA. The patient's native left circumflex had a patent stent site. His LVEF was estimated at 60%. He presented in 2014 with symptoms of unstable angina and underwent cardiac catheterization demonstrating progressive disease in obtuse marginal branch of the circumflex. FFR directed PCI was performed and this was uncomplicated.   He is here alone today.  He is doing very well. He's had no recurrent angina since his PCI procedure one year ago. Today, he denies symptoms of palpitations, chest pain, shortness of breath, orthopnea, PND, lower extremity edema, dizziness, or syncope.   Past Medical History:  Diagnosis Date  . ALLERGIC RHINITIS 08/20/2007  . Anginal pain [facility]) May 13 2013   cath done=everything ok  . Arthritis    "back; knees; shoulders"  (04/01/2016)  . Asthma    "very slight"   . BENIGN PROSTATIC HYPERTROPHY 08/20/2007  . Coronary artery disease involving native heart with angina pectoris ([place])    a. s/p CABG x 3 in 1989;  b. MI in 1995 with BMS to VG->RCA;  c. 07/2000 PCI/BMS to native LCX;  d. Known CTO of VG->Diag;  e. Mult caths w/ stable anatomy;  f. 04/2013 Cath: LM nl, ostLAD 100, LCX 10%isr, 59m, OM1/2/3 min irregs, RI min irregs, pRCA 80-90%/ 100& mid, VG->RCA ostial Stent w/ 20-30isr, LIMA->LAD ok, Occluded VG-Diag. EF 60%-->Med Rx.  . ELBOW PAIN, LEFT 04/15/2008  . ERECTILE DYSFUNCTION 08/20/2007  . GERD  (gastroesophageal reflux disease)   . History of meningitis    2nd grade, 8th grade, 9th grade, age 71  . HYPERLIPIDEMIA TYPE I / IV 09/03/2008  . HYPERTENSION 08/20/2007  . INSOMNIA-SLEEP DISORDER-UNSPEC 03/17/2008  . Microcytosis   . Myocardial infarction [facility]) June 1995  . OSA on CPAP   . PREMATURE VENTRICULAR CONTRACTIONS 08/20/2007  . SCROTAL MASS 10/09/2008  . URINARY INCONTINENCE, MALE 10/09/2008  . UTI 04/24/2009   "just this once" (05/15/2013)    Past Surgical History:  Procedure Laterality Date  . ANKLE FRACTURE SURGERY Right 1984  . BACK SURGERY    . CARDIAC CATHETERIZATION  X 2  . CARDIAC CATHETERIZATION N/A 04/01/2016   Procedure: Left Heart Cath and Cors/Grafts Angiography;  Surgeon: Leonie Man, MD;  Location: [place] [facility];  Service: Cardiovascular;  Laterality: N/A;  . CARDIAC CATHETERIZATION N/A 04/01/2016   Procedure: Intravascular Pressure Wire/FFR Study;  Surgeon: Leonie Man, MD;  Location: [place] [facility];  Service: Cardiovascular;  Laterality: N/A;  . CARDIAC CATHETERIZATION N/A 04/01/2016   Procedure: Coronary Stent Intervention;  Surgeon: Leonie Man, MD;  Location: [place] [facility];  Service: Cardiovascular;  Laterality: N/A;  . COLONOSCOPY W/ BIOPSIES AND POLYPECTOMY  11/20/2003   adenomatous polyp, internal and external hemorrhoids  . CORONARY ANGIOPLASTY  95, 98, 2000, 2001  . [place]- 04/01/2016   "total of 4 stents counting today" (  04/01/2016)  . CORONARY ARTERY BYPASS GRAFT  1989   triple bypass [facility]  . CYSTOSCOPY  08/07/2009   and right hydrocelectomy  . FOOT TENDON SURGERY Left 2012   "donor tendon"   . FRACTURE SURGERY    . HEMI-MICRODISCECTOMY LUMBAR LAMINECTOMY LEVEL 1 Right 09/18/2013   Procedure: CENTRAL DECOMPRESSION @ L4-5 FOR SPINAL STENOSIS. EXCISION OF SYNOVIAL CYST @ L4-5 RIGHT, MICRODISCECTOMY @L4 -5 RIGHT ;  Surgeon: Tobi Bastos, MD;  Location: [facility];  Service: Orthopedics;   Laterality: Right;  . INGUINAL HERNIA REPAIR Bilateral 1970's   "2 on one side; 1 on the other"   . LEFT HEART CATHETERIZATION WITH CORONARY ANGIOGRAM Bilateral 05/15/2013   Procedure: LEFT HEART CATHETERIZATION WITH CORONARY ANGIOGRAM;  Surgeon: Wellington Hampshire, MD;  Location: [facility];  Service: Cardiovascular;  Laterality: Bilateral;  . SHOULDER OPEN ROTATOR CUFF REPAIR Right 2009   [facility]  . SURGERY SCROTAL / TESTICULAR Right 2012   "removed the sac" (05/15/2013)  . TONSILLECTOMY AND ADENOIDECTOMY  ~1961  . TRANSURETHRAL RESECTION OF PROSTATE  10/02/2009   Gyrus transurethral resection     Current Outpatient Prescriptions  Medication Sig Dispense Refill  . albuterol (PROAIR HFA) 108 (90 BASE) MCG/ACT inhaler Inhale 2 puffs into the lungs every 6 (six) hours as needed for wheezing or shortness of breath.     Marland Kitchen amitriptyline (ELAVIL) 25 MG tablet Take 25 mg by mouth at bedtime. Reported on 01/25/2016    . aspirin 81 MG tablet Take 81 mg by mouth daily.     Marland Kitchen atenolol (TENORMIN) 25 MG tablet Take 25 mg by mouth at bedtime.    . clopidogrel (PLAVIX) 75 MG tablet TAKE 1 TABLET (75 MG TOTAL) BY MOUTH DAILY. 90 tablet 0  . lisinopril (PRINIVIL,ZESTRIL) 10 MG tablet TAKE 1 TABLET (10 MG TOTAL) BY MOUTH AT BEDTIME. 90 tablet 0  . Multiple Vitamin (MULTIVITAMIN WITH MINERALS) TABS tablet Take 1 tablet by mouth every morning.    . nitroGLYCERIN (NITROSTAT) 0.4 MG SL tablet Place 1 tablet (0.4 mg total) under the tongue every 5 (five) minutes as needed for chest pain. 25 tablet 0  . omeprazole (PRILOSEC) 20 MG capsule Take 1 capsule (20 mg total) by mouth daily.    . pantoprazole (PROTONIX) 40 MG tablet TAKE 1 TABLET (40 MG TOTAL) BY MOUTH DAILY. 90 tablet 1  . sildenafil (VIAGRA) 100 MG tablet Take 0.5-1 tablets (50-100 mg total) by mouth daily as needed for erectile dysfunction. 4 tablet 11  . simvastatin (ZOCOR) 80 MG tablet Take 0.5 tablets (40 mg total) by mouth at bedtime. 90 tablet 3  .  sulindac (CLINORIL) 200 MG tablet Take 200 mg by mouth 2 (two) times daily.     . temazepam (RESTORIL) 30 MG capsule Take 30 mg by mouth at bedtime as needed for sleep.     No current facility-administered medications for this visit.     Allergies:   Metoprolol   Social History:  The patient  reports that he quit smoking about 22 years ago. His smoking use included Cigarettes. He has a 45.00 pack-year smoking history. He has never used smokeless tobacco. He reports that he drinks alcohol. He reports that he does not use drugs.   Family History:  The patient's  family history includes Coronary artery disease in his father and mother; Esophageal cancer in his brother; Heart disease in his brother.   ROS:  Please see the history of present illness.  Otherwise, review of systems  is positive for easy bruising.  All other systems are reviewed and negative.   PHYSICAL EXAM: VS:  BP 132/80   Pulse 67   Ht 6' (1.829 m)   Wt 245 lb 12.8 oz (111.5 kg)   BMI 33.34 kg/m  , BMI Body mass index is 33.34 kg/m. GEN: Well nourished, well developed, in no acute distress  HEENT: normal  Neck: no JVD, no masses. No carotid bruits Cardiac: RRR without murmur or gallop                Respiratory:  clear to auscultation bilaterally, normal work of breathing GI: soft, nontender, nondistended, + BS MS: no deformity or atrophy  Ext: no pretibial edema, pedal pulses 2+= bilaterally Skin: warm and dry, no rash Neuro:  Strength and sensation are intact Psych: euthymic mood, full affect  EKG:  EKG is ordered today. The ekg ordered today shows sinus rhythm with 1st degree AV block, age-indeterminate inferior infarct - no change from 04/03/2016 tracing  Recent Labs: 04/02/2016: BUN 12; Creatinine, Ser 0.83; Hemoglobin 14.5; Platelets 167; Potassium 4.1; Sodium 139   Lipid Panel     Component Value Date/Time   CHOL 154 05/15/2013 0810   TRIG 76 05/15/2013 0810   HDL 48 05/15/2013 0810   CHOLHDL 3.2  05/15/2013 0810   VLDL 15 05/15/2013 0810   LDLCALC 91 05/15/2013 0810      Wt Readings from Last 3 Encounters:  02/20/17 245 lb 12.8 oz (111.5 kg)  10/11/16 238 lb (108 kg)  06/30/16 230 lb (104.3 kg)     Cardiac Studies Reviewed: Cardiac Cath 04/01/2016: Conclusion   1. Not listed procedures: Fractional flow reserve measurement, Coronary stent intervention 2. : 3. 2nd Mrg lesion, 75% stenosed. Post FFR guided intervention with Promus DES 2.5 mm x 12 mm (2.7 mm), there is a 0% residual stenosis. 4. Widely patent proximal circumflex stent. 5. Ost LAD lesion, 100% stenosed. 6. Widely patent tortuous LIMA-dLAD 7. Prox LAD lesion, 95% stenosed. Mid LAD lesion, 80% stenosed. Both lesions are between D1 and D2 seen via retrograde flow. 8. Ost RCA lesion, 90% stenosed. Mid RCA to Dist RCA lesion, 100% stenosed. - Known occlusion 9. Ostial SVG-RCA stent has roughly 45% ISR. The remainder the graft is patent with minimal disease to a small distal RCA that gives off PDA and PL branches. segment. 10. SVG-Diag was not injected - known to be flush occluded. The nongrafted diagonal branches clearly have compromised flow from retrograde perfusion of the graft LAD 11. Preserved LVEF with normal LVEDP   Angiographically similar coronary artery disease as seen in 2014, however in one view the lesion in the OM 2. To be more significant. Due to his ongoing symptoms of exertional angina, a FFR was performed indicating FFR of 0.78 that is physiologically significant. Therefore decision was made to proceed with PCI.  Plan:  Overnight admission to post procedure unit. Expected discharge tomorrow. Standard TR band removal.  Continue home dose of Plavix and add back aspirin  Can you other cardiac medications  He will follow-up with Dr. Burt Knack     ASSESSMENT AND PLAN: 1.  CAD status post CABG and PCI, without angina: The patient has done well since his most recent PCI procedure. His medications  are reviewed and will be continued without change. He has remained on long-term dual antiplatelet therapy. EKG is reviewed and demonstrates no changes from his previous tracing last year. I asked him to avoid Prilosec because of potential interaction  with Plavix. He will continue on Protonix 40 mg daily.  2. Hypertension: Blood pressure controlled on atenolol and lisinopril  3. Hyperlipidemia: Treated with simvastatin 40 mg daily. Followed through the [place] system with regular lab work. Reviewed the importance of diet and lifestyle modification and asked him to work to get his weight below 230 pounds.  Current medicines are reviewed with the patient today.  The patient does not have concerns regarding medicines.  Labs/ tests ordered today include:   Orders Placed This Encounter  Procedures  . EKG 12-Lead   Disposition:   FU one year  Signed, Sherren Mocha, MD  02/20/2017 12:19 PM    [facility] [address], [place], [place]  [address] Phone: [phone]; Fax: [phone]

## 2017-02-20 NOTE — Patient Instructions (Signed)
Medication Instructions:  Your physician recommends that you continue on your current medications as directed. Please refer to the Current Medication list given to you today.  Please stop Prilosec (omeprazole) when you receive your supply of Protonix (pantoprazole). We prefer Protonix due to interaction between prilosec and plavix.   Labwork: No new orders.   Testing/Procedures: No new orders.   Follow-Up: Your physician wants you to follow-up in: 1 YEAR with Dr Burt Knack.  You will receive a reminder letter in the mail two months in advance. If you don't receive a letter, please call our office to schedule the follow-up appointment.   Any Other Special Instructions Will Be Listed Below (If Applicable).     If you need a refill on your cardiac medications before your next appointment, please call your pharmacy.

## 2017-04-07 ENCOUNTER — Telehealth: Payer: Self-pay | Admitting: Internal Medicine

## 2017-04-07 DIAGNOSIS — I10 Essential (primary) hypertension: Secondary | ICD-10-CM

## 2017-04-07 DIAGNOSIS — I25119 Atherosclerotic heart disease of native coronary artery with unspecified angina pectoris: Secondary | ICD-10-CM

## 2017-04-07 MED ORDER — TEMAZEPAM 30 MG PO CAPS: 30.0000 mg | ORAL_CAPSULE | Freq: Every evening | ORAL | 5 refills | Status: DC | PRN

## 2017-04-07 MED ORDER — AMITRIPTYLINE HCL 25 MG PO TABS: 25.0000 mg | ORAL_TABLET | Freq: Every day | ORAL | 1 refills | Status: DC

## 2017-04-07 MED ORDER — ISOSORBIDE MONONITRATE ER 30 MG PO TB24: 30.0000 mg | ORAL_TABLET | Freq: Every day | ORAL | 2 refills | Status: DC

## 2017-04-07 MED ORDER — ATENOLOL 25 MG PO TABS: 25.0000 mg | ORAL_TABLET | Freq: Every day | ORAL | 1 refills | Status: DC

## 2017-04-07 MED ORDER — LISINOPRIL 10 MG PO TABS: ORAL_TABLET | ORAL | 1 refills | Status: DC

## 2017-04-07 MED ORDER — SIMVASTATIN 80 MG PO TABS: 40.0000 mg | ORAL_TABLET | Freq: Every day | ORAL | 1 refills | Status: DC

## 2017-04-07 MED ORDER — OMEPRAZOLE 20 MG PO CPDR: 20.0000 mg | DELAYED_RELEASE_CAPSULE | Freq: Every day | ORAL | 1 refills | Status: DC

## 2017-04-07 NOTE — Telephone Encounter (Signed)
Pt is needing a refill on these medications. He would like a 90 day supply sent to [facility]. He was previously getting some of these through the [place] but he would like to get them all from the same place.  atenolol (TENORMIN) 25 MG tablet isosorbide mononitrate (IMDUR) 30 MG 24 hr tablet  lisinopril (PRINIVIL,ZESTRIL) 10 MG tablet simvastatin (ZOCOR) 80 MG tablet omeprazole (PRILOSEC) 20 MG capsule temazepam (RESTORIL) 30 MG capsule amitriptyline (ELAVIL) 25 MG tablet

## 2017-04-07 NOTE — Telephone Encounter (Signed)
Informed pt Script at front desk  

## 2017-04-07 NOTE — Telephone Encounter (Signed)
imdur sent erx, as well as elavil  Temazepam - Done hardcopy to [employer]

## 2017-04-07 NOTE — Telephone Encounter (Signed)
Verified pt chart he is due for annual appt in January sent rx's on maintenance meds Pls advise on Amitriptyline, Temazepam, and Isosorbide not on med list.../lmb

## 2017-04-11 ENCOUNTER — Telehealth: Payer: Self-pay

## 2017-04-11 NOTE — Telephone Encounter (Signed)
Key [id number] Approved through 04/11/19  Approval sent to scan.

## 2017-06-08 ENCOUNTER — Encounter (HOSPITAL_COMMUNITY): Payer: Self-pay | Admitting: Emergency Medicine

## 2017-06-08 ENCOUNTER — Observation Stay (HOSPITAL_COMMUNITY)
Admission: EM | Admit: 2017-06-08 | Discharge: 2017-06-09 | Disposition: A | Discharge status: Home / Self Care | Payer: Medicare HMO | Attending: Cardiovascular Disease | Admitting: Cardiovascular Disease

## 2017-06-08 ENCOUNTER — Encounter (HOSPITAL_COMMUNITY)
Admission: EM | Disposition: A | Discharge status: Home / Self Care | Payer: Self-pay | Source: Home / Self Care | Attending: Emergency Medicine

## 2017-06-08 ENCOUNTER — Emergency Department (HOSPITAL_COMMUNITY): Payer: Medicare HMO

## 2017-06-08 DIAGNOSIS — G4700 Insomnia, unspecified: Secondary | ICD-10-CM | POA: Diagnosis not present

## 2017-06-08 DIAGNOSIS — D509 Iron deficiency anemia, unspecified: Secondary | ICD-10-CM | POA: Diagnosis not present

## 2017-06-08 DIAGNOSIS — I493 Ventricular premature depolarization: Secondary | ICD-10-CM | POA: Diagnosis not present

## 2017-06-08 DIAGNOSIS — K219 Gastro-esophageal reflux disease without esophagitis: Secondary | ICD-10-CM | POA: Insufficient documentation

## 2017-06-08 DIAGNOSIS — Z86010 Personal history of colonic polyps: Secondary | ICD-10-CM | POA: Diagnosis not present

## 2017-06-08 DIAGNOSIS — K2270 Barrett's esophagus without dysplasia: Secondary | ICD-10-CM | POA: Diagnosis not present

## 2017-06-08 DIAGNOSIS — R002 Palpitations: Secondary | ICD-10-CM | POA: Insufficient documentation

## 2017-06-08 DIAGNOSIS — Z888 Allergy status to other drugs, medicaments and biological substances status: Secondary | ICD-10-CM | POA: Diagnosis not present

## 2017-06-08 DIAGNOSIS — I252 Old myocardial infarction: Secondary | ICD-10-CM | POA: Diagnosis not present

## 2017-06-08 DIAGNOSIS — N400 Enlarged prostate without lower urinary tract symptoms: Secondary | ICD-10-CM | POA: Diagnosis not present

## 2017-06-08 DIAGNOSIS — Z955 Presence of coronary angioplasty implant and graft: Secondary | ICD-10-CM | POA: Insufficient documentation

## 2017-06-08 DIAGNOSIS — Z7902 Long term (current) use of antithrombotics/antiplatelets: Secondary | ICD-10-CM | POA: Insufficient documentation

## 2017-06-08 DIAGNOSIS — M170 Bilateral primary osteoarthritis of knee: Secondary | ICD-10-CM | POA: Insufficient documentation

## 2017-06-08 DIAGNOSIS — M479 Spondylosis, unspecified: Secondary | ICD-10-CM | POA: Diagnosis not present

## 2017-06-08 DIAGNOSIS — Z6831 Body mass index (BMI) 31.0-31.9, adult: Secondary | ICD-10-CM | POA: Insufficient documentation

## 2017-06-08 DIAGNOSIS — Z87891 Personal history of nicotine dependence: Secondary | ICD-10-CM | POA: Insufficient documentation

## 2017-06-08 DIAGNOSIS — M19012 Primary osteoarthritis, left shoulder: Secondary | ICD-10-CM | POA: Insufficient documentation

## 2017-06-08 DIAGNOSIS — Z79899 Other long term (current) drug therapy: Secondary | ICD-10-CM | POA: Insufficient documentation

## 2017-06-08 DIAGNOSIS — I10 Essential (primary) hypertension: Secondary | ICD-10-CM | POA: Diagnosis not present

## 2017-06-08 DIAGNOSIS — G4733 Obstructive sleep apnea (adult) (pediatric): Secondary | ICD-10-CM | POA: Insufficient documentation

## 2017-06-08 DIAGNOSIS — M19011 Primary osteoarthritis, right shoulder: Secondary | ICD-10-CM | POA: Insufficient documentation

## 2017-06-08 DIAGNOSIS — E785 Hyperlipidemia, unspecified: Secondary | ICD-10-CM | POA: Insufficient documentation

## 2017-06-08 DIAGNOSIS — I25119 Atherosclerotic heart disease of native coronary artery with unspecified angina pectoris: Secondary | ICD-10-CM

## 2017-06-08 DIAGNOSIS — H9193 Unspecified hearing loss, bilateral: Secondary | ICD-10-CM | POA: Diagnosis not present

## 2017-06-08 DIAGNOSIS — M48062 Spinal stenosis, lumbar region with neurogenic claudication: Secondary | ICD-10-CM | POA: Diagnosis not present

## 2017-06-08 DIAGNOSIS — I200 Unstable angina: Secondary | ICD-10-CM

## 2017-06-08 DIAGNOSIS — I25110 Atherosclerotic heart disease of native coronary artery with unstable angina pectoris: Secondary | ICD-10-CM | POA: Diagnosis not present

## 2017-06-08 DIAGNOSIS — I2510 Atherosclerotic heart disease of native coronary artery without angina pectoris: Secondary | ICD-10-CM | POA: Diagnosis not present

## 2017-06-08 DIAGNOSIS — R079 Chest pain, unspecified: Secondary | ICD-10-CM | POA: Diagnosis not present

## 2017-06-08 DIAGNOSIS — Z7982 Long term (current) use of aspirin: Secondary | ICD-10-CM | POA: Insufficient documentation

## 2017-06-08 HISTORY — PX: CARDIAC CATHETERIZATION: SHX172

## 2017-06-08 HISTORY — PX: LEFT HEART CATH AND CORS/GRAFTS ANGIOGRAPHY: CATH118250

## 2017-06-08 LAB — BASIC METABOLIC PANEL
Anion gap: 10 (ref 5–15)
BUN: 19 mg/dL (ref 6–20)
CALCIUM: 8.5 mg/dL — AB (ref 8.9–10.3)
CO2: 20 mmol/L — AB (ref 22–32)
CREATININE: 0.88 mg/dL (ref 0.61–1.24)
Chloride: 106 mmol/L (ref 101–111)
GFR calc non Af Amer: >60 mL/min (ref >60)
GLUCOSE: 155 mg/dL — AB (ref 65–99)
Potassium: 4 mmol/L (ref 3.5–5.1)
Sodium: 136 mmol/L (ref 135–145)

## 2017-06-08 LAB — CBC
HCT: 45.5 % (ref 39.0–52.0)
Hemoglobin: 14.5 g/dL (ref 13.0–17.0)
MCH: 24.7 pg — AB (ref 26.0–34.0)
MCHC: 31.9 g/dL (ref 30.0–36.0)
MCV: 77.5 fL — ABNORMAL LOW (ref 78.0–100.0)
PLATELETS: 177 10*3/uL (ref 150–400)
RBC: 5.87 MIL/uL — AB (ref 4.22–5.81)
RDW: 14.8 % (ref 11.5–15.5)
WBC: 5.8 10*3/uL (ref 4.0–10.5)

## 2017-06-08 LAB — I-STAT TROPONIN, ED
TROPONIN I, POC: 0 ng/mL (ref 0.00–0.08)
TROPONIN I, POC: 0.01 ng/mL (ref 0.00–0.08)

## 2017-06-08 LAB — PROTIME-INR
INR: 1.11
Prothrombin Time: 14.2 seconds (ref 11.4–15.2)

## 2017-06-08 SURGERY — LEFT HEART CATH AND CORS/GRAFTS ANGIOGRAPHY
Anesthesia: LOCAL

## 2017-06-08 MED ORDER — FENTANYL CITRATE (PF) 100 MCG/2ML IJ SOLN
INTRAMUSCULAR | Status: AC
  Filled 2017-06-08: qty 2

## 2017-06-08 MED ORDER — LIDOCAINE HCL (PF) 1 % IJ SOLN
INTRAMUSCULAR | Status: DC | PRN
  Administered 2017-06-08: 2 mL

## 2017-06-08 MED ORDER — ATORVASTATIN CALCIUM 40 MG PO TABS
40.0000 mg | ORAL_TABLET | Freq: Every day | ORAL | Status: DC
  Administered 2017-06-08: 40 mg via ORAL
  Filled 2017-06-08: qty 1

## 2017-06-08 MED ORDER — LISINOPRIL 10 MG PO TABS
10.0000 mg | ORAL_TABLET | Freq: Every day | ORAL | Status: DC
  Administered 2017-06-08: 10 mg via ORAL
  Filled 2017-06-08: qty 1

## 2017-06-08 MED ORDER — ASPIRIN 81 MG PO CHEW
324.0000 mg | CHEWABLE_TABLET | Freq: Once | ORAL | Status: AC
  Administered 2017-06-08: 324 mg via ORAL
  Filled 2017-06-08: qty 4

## 2017-06-08 MED ORDER — PANTOPRAZOLE SODIUM 40 MG PO TBEC: 40.0000 mg | DELAYED_RELEASE_TABLET | Freq: Every day | ORAL | Status: DC

## 2017-06-08 MED ORDER — SULINDAC 200 MG PO TABS
200.0000 mg | ORAL_TABLET | Freq: Two times a day (BID) | ORAL | Status: DC
  Administered 2017-06-08 – 2017-06-09 (×2): 200 mg via ORAL
  Filled 2017-06-08 (×2): qty 1

## 2017-06-08 MED ORDER — HEPARIN (PORCINE) IN NACL 2-0.9 UNIT/ML-% IJ SOLN
INTRAMUSCULAR | Status: AC
  Filled 2017-06-08: qty 500

## 2017-06-08 MED ORDER — SODIUM CHLORIDE 0.9% FLUSH
3.0000 mL | Freq: Two times a day (BID) | INTRAVENOUS | Status: DC
  Administered 2017-06-08: 3 mL via INTRAVENOUS

## 2017-06-08 MED ORDER — ASPIRIN 81 MG PO CHEW: 81.0000 mg | CHEWABLE_TABLET | ORAL | Status: DC

## 2017-06-08 MED ORDER — SODIUM CHLORIDE 0.9 % IV SOLN: 250.0000 mL | INTRAVENOUS | Status: DC | PRN

## 2017-06-08 MED ORDER — PANTOPRAZOLE SODIUM 40 MG PO TBEC
40.0000 mg | DELAYED_RELEASE_TABLET | Freq: Every day | ORAL | Status: DC
  Administered 2017-06-09: 40 mg via ORAL
  Filled 2017-06-08: qty 1

## 2017-06-08 MED ORDER — SODIUM CHLORIDE 0.9 % IV SOLN
INTRAVENOUS | Status: AC
  Administered 2017-06-08: 16:00:00 via INTRAVENOUS

## 2017-06-08 MED ORDER — AMITRIPTYLINE HCL 25 MG PO TABS
25.0000 mg | ORAL_TABLET | Freq: Every day | ORAL | Status: DC
  Administered 2017-06-08: 25 mg via ORAL
  Filled 2017-06-08: qty 1

## 2017-06-08 MED ORDER — MORPHINE SULFATE (PF) 4 MG/ML IV SOLN: 2.0000 mg | INTRAVENOUS | Status: DC | PRN

## 2017-06-08 MED ORDER — IOPAMIDOL (ISOVUE-370) INJECTION 76%
INTRAVENOUS | Status: AC
  Filled 2017-06-08: qty 125

## 2017-06-08 MED ORDER — ALBUTEROL SULFATE HFA 108 (90 BASE) MCG/ACT IN AERS: 2.0000 | INHALATION_SPRAY | Freq: Four times a day (QID) | RESPIRATORY_TRACT | Status: DC | PRN

## 2017-06-08 MED ORDER — ASPIRIN 81 MG PO CHEW
81.0000 mg | CHEWABLE_TABLET | Freq: Every day | ORAL | Status: DC
  Administered 2017-06-09: 81 mg via ORAL
  Filled 2017-06-08: qty 1

## 2017-06-08 MED ORDER — SODIUM CHLORIDE 0.9 % WEIGHT BASED INFUSION: 1.0000 mL/kg/h | INTRAVENOUS | Status: DC

## 2017-06-08 MED ORDER — NITROGLYCERIN 0.4 MG SL SUBL: 0.4000 mg | SUBLINGUAL_TABLET | SUBLINGUAL | Status: DC | PRN

## 2017-06-08 MED ORDER — SODIUM CHLORIDE 0.9% FLUSH: 3.0000 mL | INTRAVENOUS | Status: DC | PRN

## 2017-06-08 MED ORDER — MIDAZOLAM HCL 2 MG/2ML IJ SOLN
INTRAMUSCULAR | Status: DC | PRN
  Administered 2017-06-08 (×2): 1 mg via INTRAVENOUS

## 2017-06-08 MED ORDER — LIDOCAINE HCL 2 % IJ SOLN
INTRAMUSCULAR | Status: AC
  Filled 2017-06-08: qty 20

## 2017-06-08 MED ORDER — ACETAMINOPHEN 325 MG PO TABS: 650.0000 mg | ORAL_TABLET | ORAL | Status: DC | PRN

## 2017-06-08 MED ORDER — ANGIOPLASTY BOOK
Freq: Once | Status: AC
  Administered 2017-06-08: 23:00:00
  Filled 2017-06-08: qty 1

## 2017-06-08 MED ORDER — ONDANSETRON HCL 4 MG/2ML IJ SOLN: 4.0000 mg | Freq: Four times a day (QID) | INTRAMUSCULAR | Status: DC | PRN

## 2017-06-08 MED ORDER — SODIUM CHLORIDE 0.9 % WEIGHT BASED INFUSION: 3.0000 mL/kg/h | INTRAVENOUS | Status: DC

## 2017-06-08 MED ORDER — MIDAZOLAM HCL 2 MG/2ML IJ SOLN
INTRAMUSCULAR | Status: AC
  Filled 2017-06-08: qty 2

## 2017-06-08 MED ORDER — CLOPIDOGREL BISULFATE 75 MG PO TABS
75.0000 mg | ORAL_TABLET | Freq: Every day | ORAL | Status: DC
  Administered 2017-06-09: 75 mg via ORAL
  Filled 2017-06-08: qty 1

## 2017-06-08 MED ORDER — HEPARIN SODIUM (PORCINE) 5000 UNIT/ML IJ SOLN
5000.0000 [IU] | Freq: Three times a day (TID) | INTRAMUSCULAR | Status: DC
  Administered 2017-06-09 (×2): 5000 [IU] via SUBCUTANEOUS
  Filled 2017-06-08 (×3): qty 1

## 2017-06-08 MED ORDER — IOPAMIDOL (ISOVUE-370) INJECTION 76%
INTRAVENOUS | Status: DC | PRN
  Administered 2017-06-08: 90 mL via INTRAVENOUS

## 2017-06-08 MED ORDER — ALBUTEROL SULFATE (2.5 MG/3ML) 0.083% IN NEBU: 2.5000 mg | INHALATION_SOLUTION | Freq: Four times a day (QID) | RESPIRATORY_TRACT | Status: DC | PRN

## 2017-06-08 MED ORDER — ATENOLOL 25 MG PO TABS
25.0000 mg | ORAL_TABLET | Freq: Every day | ORAL | Status: DC
  Administered 2017-06-08: 25 mg via ORAL
  Filled 2017-06-08: qty 1

## 2017-06-08 MED ORDER — SODIUM CHLORIDE 0.9% FLUSH: 3.0000 mL | Freq: Two times a day (BID) | INTRAVENOUS | Status: DC

## 2017-06-08 MED ORDER — FENTANYL CITRATE (PF) 100 MCG/2ML IJ SOLN
INTRAMUSCULAR | Status: DC | PRN
  Administered 2017-06-08 (×2): 25 ug via INTRAVENOUS

## 2017-06-08 MED ORDER — NITROGLYCERIN IN D5W 200-5 MCG/ML-% IV SOLN
0.0000 ug/min | INTRAVENOUS | Status: DC
  Administered 2017-06-08: 5 ug/min via INTRAVENOUS
  Filled 2017-06-08: qty 250

## 2017-06-08 MED ORDER — LISINOPRIL 10 MG PO TABS
10.0000 mg | ORAL_TABLET | Freq: Every day | ORAL | Status: DC
  Filled 2017-06-08: qty 1

## 2017-06-08 SURGICAL SUPPLY — 10 items
CATH INFINITI 5FR JL5 (CATHETERS) ×1 IMPLANT
CATH INFINITI 5FR MULTPACK ANG (CATHETERS) ×1 IMPLANT
DEVICE CLOSURE MYNXGRIP 5F (Vascular Products) ×1 IMPLANT
KIT HEART LEFT (KITS) ×2 IMPLANT
PACK CARDIAC CATHETERIZATION (CUSTOM PROCEDURE TRAY) ×2 IMPLANT
SHEATH PINNACLE 5F 10CM (SHEATH) ×1 IMPLANT
SYR MEDRAD MARK V 150ML (SYRINGE) ×2 IMPLANT
TRANSDUCER W/STOPCOCK (MISCELLANEOUS) ×2 IMPLANT
TUBING CIL FLEX 10 FLL-RA (TUBING) ×2 IMPLANT
WIRE EMERALD 3MM-J .035X150CM (WIRE) ×1 IMPLANT

## 2017-06-08 NOTE — ED Provider Notes (Signed)
[facility] DEPT Provider Note   CSN: [id number] Arrival date & time: 06/08/17  0932     History   Chief Complaint Chief Complaint  Patient presents with  . Chest Pain    HPI Robert [name] is a 71 y.o. male.  The patient is here for evaluation of chest pain, intermittent for the last several days requiring use of sublingual nitroglycerin.  He is concerned that this indicates an unstable cardiac syndrome, therefore came here for evaluation.  The pain is felt as both dull and sharp in the left anterior chest, occasionally radiating to the left arm and mid back.  There is no associated diaphoresis, shortness of breath, nausea, vomiting, weakness or dizziness.  Symptoms are new, last time he used nitroglycerin was 1 year ago.  He is taking his usual medications.  He never takes aspirin.  He took Plavix this morning at 4 AM.  He denies other illnesses recently.  There are no other known modifying factors.  HPI  Past Medical History:  Diagnosis Date  . ALLERGIC RHINITIS 08/20/2007  . Anginal pain [facility]) May 13 2013   cath done=everything ok  . Arthritis    "back; knees; shoulders"  (04/01/2016)  . Asthma    "very slight"   . BENIGN PROSTATIC HYPERTROPHY 08/20/2007  . Coronary artery disease involving native heart with angina pectoris ([place])    a. s/p CABG x 3 in 1989;  b. MI in 1995 with BMS to VG->RCA;  c. 07/2000 PCI/BMS to native LCX;  d. Known CTO of VG->Diag;  e. Mult caths w/ stable anatomy;  f. 04/2013 Cath: LM nl, ostLAD 100, LCX 10%isr, 15m, OM1/2/3 min irregs, RI min irregs, pRCA 80-90%/ 100& mid, VG->RCA ostial Stent w/ 20-30isr, LIMA->LAD ok, Occluded VG-Diag. EF 60%-->Med Rx.  . ELBOW PAIN, LEFT 04/15/2008  . ERECTILE DYSFUNCTION 08/20/2007  . GERD (gastroesophageal reflux disease)   . History of meningitis    2nd grade, 8th grade, 9th grade, age 8  . HYPERLIPIDEMIA TYPE I / IV 09/03/2008  . HYPERTENSION 08/20/2007  . INSOMNIA-SLEEP DISORDER-UNSPEC 03/17/2008  .  Microcytosis   . Myocardial infarction [facility]) June 1995  . OSA on CPAP   . PREMATURE VENTRICULAR CONTRACTIONS 08/20/2007  . SCROTAL MASS 10/09/2008  . URINARY INCONTINENCE, MALE 10/09/2008  . UTI 04/24/2009   "just this once" (05/15/2013)    Patient Active Problem List   Diagnosis Date Noted  . Cough 10/17/2016  . Right-sided chest pain 10/11/2016  . Corn of foot 06/10/2016  . Chest pain 04/01/2016  . Coronary artery disease involving native heart with angina pectoris ([place])   . S/P CABG x 3   . Hypertensive heart disease with heart failure ([place])   . Angina pectoris ([place])   . Hearing loss of both ears 05/21/2015  . Rash 05/21/2015  . Rash and nonspecific skin eruption 09/24/2014  . Spinal stenosis, lumbar region, with neurogenic claudication 09/18/2013  . Unstable angina ([place]) 05/16/2013  . OSA (obstructive sleep apnea) 05/16/2013  . Morbid obesity ([place]) 05/16/2013  . Barrett's esophagus 09/12/2011  . Iron deficiency anemia, unspecified 08/24/2011  . Long term current use of Plavix and aspirin due to coronary artery disease 08/19/2011  . Personal history of adenomatous colonic polyps 04/30/2011  . Impaired glucose tolerance 04/29/2011  . Microcytosis 04/29/2011  . Preventative health care 04/29/2011  . SCROTAL MASS 10/09/2008  . URINARY INCONTINENCE, MALE 10/09/2008  . HYPERLIPIDEMIA TYPE I / IV 09/03/2008  . ELBOW PAIN, LEFT 04/15/2008  .  INSOMNIA-SLEEP DISORDER-UNSPEC 03/17/2008  . Hyperlipidemia type IV 08/20/2007  . ERECTILE DYSFUNCTION 08/20/2007  . Essential hypertension 08/20/2007  . Coronary atherosclerosis of autologous vein bypass graft 08/20/2007  . PREMATURE VENTRICULAR CONTRACTIONS 08/20/2007  . ALLERGIC RHINITIS 08/20/2007  . BENIGN PROSTATIC HYPERTROPHY 08/20/2007  . LOW BACK PAIN 08/20/2007    Past Surgical History:  Procedure Laterality Date  . ANKLE FRACTURE SURGERY Right 1984  . BACK SURGERY    . CARDIAC CATHETERIZATION  X 2  . CARDIAC CATHETERIZATION  N/A 04/01/2016   Procedure: Left Heart Cath and Cors/Grafts Angiography;  Surgeon: Leonie Man, MD;  Location: [place] CV LAB;  Service: Cardiovascular;  Laterality: N/A;  . CARDIAC CATHETERIZATION N/A 04/01/2016   Procedure: Intravascular Pressure Wire/FFR Study;  Surgeon: Leonie Man, MD;  Location: [place] CV LAB;  Service: Cardiovascular;  Laterality: N/A;  . CARDIAC CATHETERIZATION N/A 04/01/2016   Procedure: Coronary Stent Intervention;  Surgeon: Leonie Man, MD;  Location: [place] CV LAB;  Service: Cardiovascular;  Laterality: N/A;  . COLONOSCOPY W/ BIOPSIES AND POLYPECTOMY  11/20/2003   adenomatous polyp, internal and external hemorrhoids  . CORONARY ANGIOPLASTY  95, 98, 2000, 2001  . [place]- 04/01/2016   "total of 4 stents counting today" (04/01/2016)  . CORONARY ARTERY BYPASS GRAFT  1989   triple bypass MUSC  . CORONARY ARTERY BYPASS GRAFT    . CYSTOSCOPY  08/07/2009   and right hydrocelectomy  . FOOT TENDON SURGERY Left 2012   "donor tendon"   . FRACTURE SURGERY    . HEMI-MICRODISCECTOMY LUMBAR LAMINECTOMY LEVEL 1 Right 09/18/2013   Procedure: CENTRAL DECOMPRESSION @ L4-5 FOR SPINAL STENOSIS. EXCISION OF SYNOVIAL CYST @ L4-5 RIGHT, MICRODISCECTOMY @L4 -5 RIGHT ;  Surgeon: Tobi Bastos, MD;  Location: [facility] ORS;  Service: Orthopedics;  Laterality: Right;  . INGUINAL HERNIA REPAIR Bilateral 1970's   "2 on one side; 1 on the other"   . LEFT HEART CATHETERIZATION WITH CORONARY ANGIOGRAM Bilateral 05/15/2013   Procedure: LEFT HEART CATHETERIZATION WITH CORONARY ANGIOGRAM;  Surgeon: Wellington Hampshire, MD;  Location: [place] CATH LAB;  Service: Cardiovascular;  Laterality: Bilateral;  . SHOULDER OPEN ROTATOR CUFF REPAIR Right 2009   [place]re  . SURGERY SCROTAL / TESTICULAR Right 2012   "removed the sac" (05/15/2013)  . TONSILLECTOMY AND ADENOIDECTOMY  ~1961  . TRANSURETHRAL RESECTION OF PROSTATE  10/02/2009   [place] transurethral  resection        Home Medications    Prior to Admission medications   Medication Sig Start Date End Date Taking? Authorizing Provider  amitriptyline (ELAVIL) 25 MG tablet Take 1 tablet (25 mg total) by mouth at bedtime. Reported on 01/25/2016 04/07/17  Yes Biagio Borg, MD  atenolol (TENORMIN) 25 MG tablet Take 1 tablet (25 mg total) by mouth at bedtime. 04/07/17  Yes Biagio Borg, MD  clopidogrel (PLAVIX) 75 MG tablet TAKE 1 TABLET (75 MG TOTAL) BY MOUTH DAILY. 11/04/15  Yes Biagio Borg, MD  isosorbide mononitrate (IMDUR) 30 MG 24 hr tablet Take 1 tablet (30 mg total) by mouth daily. 04/07/17  Yes Biagio Borg, MD  lisinopril (PRINIVIL,ZESTRIL) 10 MG tablet TAKE 1 TABLET (10 MG TOTAL) BY MOUTH AT BEDTIME. Patient taking differently: Take 10 mg by mouth daily.  04/07/17  Yes Biagio Borg, MD  nitroGLYCERIN (NITROSTAT) 0.4 MG SL tablet Place 1 tablet (0.4 mg total) under the tongue every 5 (five) minutes as needed for chest pain. 07/19/16  Yes Biagio Borg, MD  omeprazole (PRILOSEC) 20 MG capsule Take 1 capsule (20 mg total) by mouth daily. 04/07/17  Yes Biagio Borg, MD  simvastatin (ZOCOR) 80 MG tablet Take 0.5 tablets (40 mg total) by mouth at bedtime. Patient taking differently: Take 40 mg by mouth every evening.  04/07/17  Yes Biagio Borg, MD  sulindac (CLINORIL) 200 MG tablet Take 200 mg by mouth 2 (two) times daily.    Yes [provider]  temazepam (RESTORIL) 30 MG capsule Take 1 capsule (30 mg total) by mouth at bedtime as needed for sleep. 04/07/17  Yes Biagio Borg, MD  albuterol [facility] HFA) 108 (90 BASE) MCG/ACT inhaler Inhale 2 puffs into the lungs every 6 (six) hours as needed for wheezing or shortness of breath.     [provider]  pantoprazole (PROTONIX) 40 MG tablet TAKE 1 TABLET EVERY DAY Patient taking differently: TAKE 1 TABLET daily as needed if you do not have Omeprazole 02/21/17   Sherren Mocha, MD    Family History Family History  Problem  Relation Age of Onset  . Coronary artery disease Father   . Coronary artery disease Mother        pacemaker  . Heart disease Brother   . Esophageal cancer Brother     Social History Social History  Substance Use Topics  . Smoking status: Former Smoker    Packs/day: 0.00    Years: 45.00    Types: Cigarettes    Quit date: 04/23/1994  . Smokeless tobacco: Never Used  . Alcohol use Yes     Comment:  "I've been quit drinking since 1989"     Allergies   Metoprolol   Review of Systems Review of Systems  All other systems reviewed and are negative.    Physical Exam Updated Vital Signs BP (!) 149/93   Pulse 67   Temp 98.2 F (36.8 C) (Oral)   Resp 17   Ht 6' (1.829 m)   Wt 108.9 kg (240 lb)   SpO2 97%   BMI 32.55 kg/m   Physical Exam  Constitutional: He is oriented to person, place, and time. He appears well-developed. No distress.  Elderly  HENT:  Head: Normocephalic and atraumatic.  Right Ear: External ear normal.  Left Ear: External ear normal.  Eyes: Pupils are equal, round, and reactive to light. Conjunctivae and EOM are normal.  Neck: Normal range of motion and phonation normal. Neck supple.  Cardiovascular: Normal rate, regular rhythm and normal heart sounds.   Pulmonary/Chest: Effort normal and breath sounds normal. He exhibits no bony tenderness.  Abdominal: Soft. There is no tenderness.  Musculoskeletal: Normal range of motion. He exhibits no edema or deformity.  Neurological: He is alert and oriented to person, place, and time. No cranial nerve deficit or sensory deficit. He exhibits normal muscle tone. Coordination normal.  Skin: Skin is warm, dry and intact.  Psychiatric: He has a normal mood and affect. His behavior is normal. Judgment and thought content normal.  Nursing note and vitals reviewed.    ED Treatments / Results  Labs (all labs ordered are listed, but only abnormal results are displayed) Labs Reviewed  BASIC METABOLIC PANEL -  Abnormal; Notable for the following:       Result Value   CO2 20 (*)    Glucose, Bld 155 (*)    Calcium 8.5 (*)    All other components within normal limits  CBC - Abnormal; Notable for the following:  RBC 5.87 (*)    MCV 77.5 (*)    MCH 24.7 (*)    All other components within normal limits  PROTIME-INR  CBC  CREATININE, SERUM  I-STAT TROPONIN, ED  I-STAT TROPONIN, ED    EKG  EKG Interpretation  Date/Time:  Thursday June 08 2017 06:40:59 EDT Ventricular Rate:  67 PR Interval:  190 QRS Duration: 88 QT Interval:  406 QTC Calculation: 429 R Axis:   50 Text Interpretation:  Normal sinus rhythm Anterior infarct , age undetermined Abnormal ECG Since last tracing PR interval has normalized Confirmed by Daleen Bo [phone]) on 06/08/2017 7:08:07 AM       Radiology Dg Chest 2 View  Result Date: 06/08/2017 CLINICAL DATA:  Left-sided chest pain for 3 days. EXAM: CHEST  2 VIEW COMPARISON:  10/11/2016 FINDINGS: Upper normal heart size. Low volumes with bibasilar atelectasis. Retrocardiac linear opacities likely represent volume loss. Postop changes. No pneumothorax. No pleural effusion. No sign of interstitial edema. IMPRESSION: Bibasilar atelectasis. Electronically Signed   By: Marybelle Killings M.D.   On: 06/08/2017 07:30    Procedures Procedures (including critical care time)  Medications Ordered in ED Medications  nitroGLYCERIN 50 mg in dextrose 5 % 250 mL (0.2 mg/mL) infusion ( Intravenous MAR Unhold 06/08/17 1554)  amitriptyline (ELAVIL) tablet 25 mg ( Oral MAR Un[place] 06/08/17 1554)  atenolol (TENORMIN) tablet 25 mg ( Oral MAR Un[place] 06/08/17 1554)  atorvastatin (LIPITOR) tablet 40 mg ( Oral MAR Un[place] 06/08/17 1554)  clopidogrel (PLAVIX) tablet 75 mg ( Oral MAR Un[place] 06/08/17 1554)  sulindac (CLINORIL) tablet 200 mg ( Oral MAR Un[place] 06/08/17 1554)  nitroGLYCERIN (NITROSTAT) SL tablet 0.4 mg ( Sublingual MAR Unhold 06/08/17 1554)  acetaminophen (TYLENOL) tablet 650 mg ( Oral  MAR Unhold 06/08/17 1554)  ondansetron (ZOFRAN) injection 4 mg ( Intravenous MAR Unhold 06/08/17 1554)  heparin injection 5,000 Units ( Subcutaneous MAR Unhold 06/08/17 1554)  lisinopril (PRINIVIL,ZESTRIL) tablet 10 mg ( Oral MAR Unhold 06/08/17 1554)  acetaminophen (TYLENOL) tablet 650 mg (not administered)  ondansetron (ZOFRAN) injection 4 mg (not administered)  0.9 %  sodium chloride infusion (not administered)  sodium chloride flush (NS) 0.9 % injection 3 mL (not administered)  sodium chloride flush (NS) 0.9 % injection 3 mL (not administered)  0.9 %  sodium chloride infusion (not administered)  Morphine Sulfate (PF) SOLN 2 mg (not administered)  aspirin chewable tablet 81 mg (not administered)  pantoprazole (PROTONIX) EC tablet 40 mg (not administered)  aspirin chewable tablet 324 mg (324 mg Oral Given 06/08/17 0826)     Initial Impression / Assessment and Plan / ED Course  I have reviewed the triage vital signs and the nursing notes.  Pertinent labs & imaging results that were available during my care of the patient were reviewed by me and considered in my medical decision making (see chart for details).      Patient Vitals for the past 24 hrs:  BP Temp Temp src Pulse Resp SpO2 Height Weight  06/08/17 1533 (!) 149/93 - - 67 17 97 % - -  06/08/17 1528 (!) 150/95 - - 62 19 96 % - -  06/08/17 1523 (!) 151/90 - - 72 (!) 21 96 % - -  06/08/17 1518 (!) 153/94 - - 74 17 93 % - -  06/08/17 1513 (!) 153/98 - - 75 (!) 21 98 % - -  06/08/17 1508 (!) 153/94 - - 64 19 97 % - -  06/08/17 1503 (!) 157/100 - -  63 13 93 % - -  06/08/17 1458 (!) 148/100 - - (!) 56 10 97 % - -  06/08/17 1453 (!) 154/95 - - 61 10 99 % - -  06/08/17 1451 - - - - - 99 % - -  06/08/17 1448 (!) 148/93 - - 66 15 98 % - -  06/08/17 1444 - - - 65 (!) 38 97 % - -  06/08/17 1443 - - - (!) 59 - - - -  06/08/17 1415 129/82 - - (!) 58 14 94 % - -  06/08/17 1400 127/80 - - 67 18 95 % - -  06/08/17 1345 (!) 146/91 - - (!)  57 13 96 % - -  06/08/17 1330 135/83 - - 63 18 96 % - -  06/08/17 1315 135/81 - - (!) 56 18 94 % - -  06/08/17 1300 (!) 125/94 - - 65 17 97 % - -  06/08/17 1245 132/80 - - (!) 56 18 94 % - -  06/08/17 1230 122/78 - - 64 14 96 % - -  06/08/17 1215 126/84 - - (!) 56 16 98 % - -  06/08/17 1200 130/82 - - (!) 55 18 94 % - -  06/08/17 1145 128/87 - - (!) 55 15 95 % - -  06/08/17 1115 112/81 - - (!) 54 18 93 % - -  06/08/17 1100 121/83 - - (!) 55 17 92 % - -  06/08/17 1045 138/87 - - (!) 53 17 95 % - -  06/08/17 1030 132/88 - - (!) 51 18 93 % - -  06/08/17 1015 129/83 - - (!) 54 - 96 % - -  06/08/17 1000 135/83 - - (!) 55 - 95 % - -  06/08/17 0945 (!) 136/97 - - (!) 48 18 93 % - -  06/08/17 0930 132/80 - - (!) 51 18 96 % - -  06/08/17 0915 136/82 - - (!) 52 16 95 % - -  06/08/17 0900 130/82 - - (!) 52 18 95 % - -  06/08/17 0845 (!) 143/81 - - (!) 59 18 96 % - -  06/08/17 0830 132/79 - - (!) 58 16 93 % - -  06/08/17 0815 128/76 - - (!) 56 20 96 % - -  06/08/17 0800 137/82 - - 60 16 97 % - -  06/08/17 0745 120/80 - - (!) 57 12 99 % - -  06/08/17 0730 125/83 - - (!) 58 18 96 % - -  06/08/17 0715 (!) 122/105 - - 64 (!) 24 92 % - -  06/08/17 0644 134/85 98.2 F (36.8 C) Oral 69 20 97 % 6' (1.829 m) 108.9 kg (240 lb)   The case was discussed with cardiology, who will evaluate the patient in the emergency department for unstable angina.   CRITICAL CARE Performed by: Richarda Blade Total critical care time: 35 minutes Critical care time was exclusive of separately billable procedures and treating other patients. Critical care was necessary to treat or prevent imminent or life-threatening deterioration. Critical care was time spent personally by me on the following activities: development of treatment plan with patient and/or surrogate as well as nursing, discussions with consultants, evaluation of patient's response to treatment, examination of patient, obtaining history from patient or  surrogate, ordering and performing treatments and interventions, ordering and review of laboratory studies, ordering and review of radiographic studies, pulse oximetry and re-evaluation of patient's condition.   Final Clinical Impressions(s) /  ED Diagnoses   Final diagnoses:  Unstable angina (HCC)   Known coronary artery disease status post CABG and stenting, last one year ago.  Patient with him recent crescendo chest pain, episodic, concerning for unstable angina.  Initial EKG and troponin are reassuring.  Cardiology consultation in the emergency department.  Urgent treatment begun with aspirin and nitroglycerin.  Nursing Notes Reviewed/ Care Coordinated Applicable Imaging Reviewed Interpretation of Laboratory Data incorporated into ED treatment  Plan: [place] Prescriptions Current Discharge Medication List       Daleen Bo, MD 06/08/17 1605

## 2017-06-08 NOTE — ED Triage Notes (Signed)
Patient reports intermittent  left chest pain radiating to left arm and upper back onset 3 days ago with SOB and mild diaphoresis , history of CAD /CABG / Coronary Stents , his cardiologist is Dr. Ezzie Dural.

## 2017-06-08 NOTE — ED Notes (Signed)
Pain continues at 1/10.

## 2017-06-08 NOTE — H&P (Signed)
Cardiology Admission History and Physical:   Patient ID: Robert [name]; MRN: [id number]; DOB: 10/23/1945   Admission date: 06/08/2017  Primary Care Provider: Biagio Borg, MD Primary Cardiologist: Dr. Burt Knack  Chief Complaint:  Chest pain   Patient Profile:   Robert [name] is a 71 y.o. male with a history of CAD s/p CABG 1989 and multiple PCI afterwards, hypertension, hyperlipidemia, OSA on CPAP and GERD presented for evaluation of chest pain.  Last cardiac catheterization 03/2016 showed stable coronary angiography compared to 2014 except questionable more significant OM 2 to 75%. FFR was 0.78 s/p DES with 0% residual stenosis.  He was doing well on cardiac stand point when last seen by Dr. Burt Knack 02/20/17.  History of Present Illness:   Mr. Kampa presented with 3 days history of intermittent left-sided chest pain. He described pain as a sharp/pressure. Associated with shortness of breath and diaphoresis. His symptoms improves with sublingual nitroglycerin however reoccurs. Recently fatigued and tired with playing golf. He plays 3 times per week. This morning he woke up at 4 AM with first episode. Symptoms somewhat improved after nitroglycerin however intensified leading to ER presentation. He denies orthopnea, PND, syncope, lower extremity edema, melena or blood in his stool or urine. Compliant with medication. He does endorse intermittent palpitation however not related with chest pain episode. This episode is somewhat different from prior angina.  In ER his chest discomfort improved from 6/10 to 3/10 on IV nitro. Point-of-care troponin negative. Electrolyte and kidney function normal. Chest x-ray unremarkable. EKG shows sinus rhythm at rate of 67 bpm, no acute changes - personally reviewed. Telemetry shows sinus rhythm at rate of 60s, no arrhythmia noted-personally reviewed.   Past Medical History:  Diagnosis Date  . ALLERGIC RHINITIS 08/20/2007  . Anginal pain [facility]) May 13 2013   cath done=everything ok  . Arthritis    "back; knees; shoulders"  (04/01/2016)  . Asthma    "very slight"   . BENIGN PROSTATIC HYPERTROPHY 08/20/2007  . Coronary artery disease involving native heart with angina pectoris ([place])    a. s/p CABG x 3 in 1989;  b. MI in 1995 with BMS to VG->RCA;  c. 07/2000 PCI/BMS to native LCX;  d. Known CTO of VG->Diag;  e. Mult caths w/ stable anatomy;  f. 04/2013 Cath: LM nl, ostLAD 100, LCX 10%isr, 45m, OM1/2/3 min irregs, RI min irregs, pRCA 80-90%/ 100& mid, VG->RCA ostial Stent w/ 20-30isr, LIMA->LAD ok, Occluded VG-Diag. EF 60%-->Med Rx.  . ELBOW PAIN, LEFT 04/15/2008  . ERECTILE DYSFUNCTION 08/20/2007  . GERD (gastroesophageal reflux disease)   . History of meningitis    2nd grade, 8th grade, 9th grade, age 23  . HYPERLIPIDEMIA TYPE I / IV 09/03/2008  . HYPERTENSION 08/20/2007  . INSOMNIA-SLEEP DISORDER-UNSPEC 03/17/2008  . Microcytosis   . Myocardial infarction [facility]) June 1995  . OSA on CPAP   . PREMATURE VENTRICULAR CONTRACTIONS 08/20/2007  . SCROTAL MASS 10/09/2008  . URINARY INCONTINENCE, MALE 10/09/2008  . UTI 04/24/2009   "just this once" (05/15/2013)    Past Surgical History:  Procedure Laterality Date  . ANKLE FRACTURE SURGERY Right 1984  . BACK SURGERY    . CARDIAC CATHETERIZATION  X 2  . CARDIAC CATHETERIZATION N/A 04/01/2016   Procedure: Left Heart Cath and Cors/Grafts Angiography;  Surgeon: Leonie Man, MD;  Location: [place] [facility];  Service: Cardiovascular;  Laterality: N/A;  . CARDIAC CATHETERIZATION N/A 04/01/2016   Procedure: Intravascular Pressure Wire/FFR Study;  Surgeon:  Leonie Man, MD;  Location: [place] [facility];  Service: Cardiovascular;  Laterality: N/A;  . CARDIAC CATHETERIZATION N/A 04/01/2016   Procedure: Coronary Stent Intervention;  Surgeon: Leonie Man, MD;  Location: [place] [facility];  Service: Cardiovascular;  Laterality: N/A;  . COLONOSCOPY W/ BIOPSIES AND POLYPECTOMY  11/20/2003   adenomatous  polyp, internal and external hemorrhoids  . CORONARY ANGIOPLASTY  95, 98, 2000, 2001  . [place]- 04/01/2016   "total of 4 stents counting today" (04/01/2016)  . CORONARY ARTERY BYPASS GRAFT  1989   triple bypass MUSC  . CORONARY ARTERY BYPASS GRAFT    . CYSTOSCOPY  08/07/2009   and right hydrocelectomy  . FOOT TENDON SURGERY Left 2012   "donor tendon"   . FRACTURE SURGERY    . HEMI-MICRODISCECTOMY LUMBAR LAMINECTOMY LEVEL 1 Right 09/18/2013   Procedure: CENTRAL DECOMPRESSION @ L4-5 FOR SPINAL STENOSIS. EXCISION OF SYNOVIAL CYST @ L4-5 RIGHT, MICRODISCECTOMY @L4 -5 RIGHT ;  Surgeon: Tobi Bastos, MD;  Location: [facility];  Service: Orthopedics;  Laterality: Right;  . INGUINAL HERNIA REPAIR Bilateral 1970's   "2 on one side; 1 on the other"   . LEFT HEART CATHETERIZATION WITH CORONARY ANGIOGRAM Bilateral 05/15/2013   Procedure: LEFT HEART CATHETERIZATION WITH CORONARY ANGIOGRAM;  Surgeon: Wellington Hampshire, MD;  Location: [place] C[facility];  Service: Cardiovascular;  Laterality: Bilateral;  . SHOULDER OPEN ROTATOR CUFF REPAIR Right 2009   [place]  . SURGERY SCROTAL / TESTICULAR Right 2012   "removed the sac" (05/15/2013)  . TONSILLECTOMY AND ADENOIDECTOMY  ~1961  . TRANSURETHRAL RESECTION OF PROSTATE  10/02/2009   Gyrus transurethral resection      Medications Prior to Admission: Prior to Admission medications   Medication Sig Start Date End Date Taking? Authorizing Provider  albuterol (PROAIR HFA) 108 (90 BASE) MCG/ACT inhaler Inhale 2 puffs into the lungs every 6 (six) hours as needed for wheezing or shortness of breath.     [provider]  amitriptyline (ELAVIL) 25 MG tablet Take 1 tablet (25 mg total) by mouth at bedtime. Reported on 01/25/2016 04/07/17   Biagio Borg, MD  aspirin 81 MG tablet Take 81 mg by mouth daily.     [provider]  atenolol (TENORMIN) 25 MG tablet Take 1 tablet (25 mg total) by mouth at bedtime. 04/07/17   Biagio Borg, MD  clopidogrel (PLAVIX) 75 MG tablet TAKE 1 TABLET (75 MG TOTAL) BY MOUTH DAILY. 11/04/15   Biagio Borg, MD  isosorbide mononitrate (IMDUR) 30 MG 24 hr tablet Take 1 tablet (30 mg total) by mouth daily. 04/07/17   Biagio Borg, MD  lisinopril (PRINIVIL,ZESTRIL) 10 MG tablet TAKE 1 TABLET (10 MG TOTAL) BY MOUTH AT BEDTIME. 04/07/17   Biagio Borg, MD  Multiple Vitamin (MULTIVITAMIN WITH MINERALS) TABS tablet Take 1 tablet by mouth every morning.    [provider]  nitroGLYCERIN (NITROSTAT) 0.4 MG SL tablet Place 1 tablet (0.4 mg total) under the tongue every 5 (five) minutes as needed for chest pain. 07/19/16   Biagio Borg, MD  omeprazole (PRILOSEC) 20 MG capsule Take 1 capsule (20 mg total) by mouth daily. 04/07/17   Biagio Borg, MD  pantoprazole (PROTONIX) 40 MG tablet TAKE 1 TABLET EVERY DAY 02/21/17   Sherren Mocha, MD  sildenafil (VIAGRA) 100 MG tablet Take 0.5-1 tablets (50-100 mg total) by mouth daily as needed for erectile dysfunction. 10/11/16   Cathlean Cower  W, MD  simvastatin (ZOCOR) 80 MG tablet Take 0.5 tablets (40 mg total) by mouth at bedtime. 04/07/17   Biagio Borg, MD  sulindac (CLINORIL) 200 MG tablet Take 200 mg by mouth 2 (two) times daily.     [provider]  temazepam (RESTORIL) 30 MG capsule Take 1 capsule (30 mg total) by mouth at bedtime as needed for sleep. 04/07/17   Biagio Borg, MD     Allergies:    Allergies  Allergen Reactions  . Metoprolol Other (See Comments)    REACTION: severe bradycardia    Social History:   Social History   Social History  . Marital status: Married    Spouse name: N/A  . Number of children: 1  . Years of education: N/A   Occupational History  . 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 Smoking status: Former Smoker    Packs/day: 0.00    Years: 45.00    Types: Cigarettes    Quit date: 04/23/1994  . Smokeless tobacco: Never Used  . Alcohol use Yes       Comment:  "I've been quit drinking since 1989"  . Drug use: No  . Sexual activity: Yes   Other Topics Concern  . Not on file   Social History Narrative  . No narrative on file    Family History:   The patient's family history includes Coronary artery disease in his father and mother; Esophageal cancer in his brother; Heart disease in his brother.    ROS:  Please see the history of present illness.  All other ROS reviewed and negative.     Physical Exam/Data:   Vitals:   06/08/17 0800 06/08/17 0815 06/08/17 0830 06/08/17 0845  BP: 137/82 128/76 132/79 (!) 143/81  Pulse: 60 (!) 56 (!) 58 (!) 59  Resp: 16 20 16 18   Temp:      TempSrc:      SpO2: 97% 96% 93% 96%  Weight:      Height:       No intake or output data in the 24 hours ending 06/08/17 0913 Filed Weights   06/08/17 0644  Weight: 240 lb (108.9 kg)   Body mass index is 32.55 kg/m.  General:  Well nourished, well developed, in no acute distress HEENT: normal Lymph: no adenopathy Neck: no JVD Endocrine:  No thryomegaly Vascular: No carotid bruits; FA pulses 2+ bilaterally without bruits  Cardiac:  normal S1, S2; RRR; no murmur  Lungs:  clear to auscultation bilaterally, no wheezing, rhonchi or rales  Abd: soft, nontender, no hepatomegaly  Ext: no edema Musculoskeletal:  No deformities, BUE and BLE strength normal and equal Skin: warm and dry  Neuro:  CNs 2-12 intact, no focal abnormalities noted Psych:  Normal affect   Relevant CV Studies:  Cardiac Cath 04/01/2016: Conclusion   1. Not listed procedures: Fractional flow reserve measurement, Coronary stent intervention 2. : 3. 2nd Mrg lesion, 75% stenosed. Post FFR guided intervention with Promus DES 2.5 mm x 12 mm (2.7 mm), there is a 0% residual stenosis. 4. Widely patent proximal circumflex stent. 5. Ost LAD lesion, 100% stenosed. 6. Widely patent tortuous LIMA-dLAD 7. Prox LAD lesion, 95% stenosed. Mid LAD lesion, 80% stenosed. Both lesions are  between D1 and D2 seen via retrograde flow. 8. Ost RCA lesion, 90% stenosed. Mid RCA to Dist RCA lesion, 100% stenosed. - Known occlusion 9. Ostial SVG-RCA  stent has roughly 45% ISR. The remainder the graft is patent with minimal disease to a small distal RCA that gives off PDA and PL branches. segment. 10. SVG-Diag was not injected - known to be flush occluded. The nongrafted diagonal branches clearly have compromised flow from retrograde perfusion of the graft LAD 11. Preserved LVEF with normal LVEDP  Angiographically similar coronary artery disease as seen in 2014, however in one view the lesion in the OM 2. To be more significant. Due to his ongoing symptoms of exertional angina, a FFR was performed indicating FFR of 0.78 that is physiologically significant. Therefore decision was made to proceed with PCI.  Plan:  Overnight admission to post procedure unit. Expected discharge tomorrow. Standard TR band removal.  Continue home dose of Plavix and add back aspirin  Can you other cardiac medications   Diagnostic Diagram       Post-Intervention Diagram          Laboratory Data:  Chemistry Recent Labs Lab 06/08/17 0654  NA 136  K 4.0  CL 106  CO2 20*  GLUCOSE 155*  BUN 19  CREATININE 0.88  CALCIUM 8.5*  GFRNONAA >60  GFRAA >60  ANIONGAP 10    No results for input(s): PROT, ALBUMIN, AST, ALT, ALKPHOS, BILITOT in the last 168 hours. Hematology Recent Labs Lab 06/08/17 0654  WBC 5.8  RBC 5.87*  HGB 14.5  HCT 45.5  MCV 77.5*  MCH 24.7*  MCHC 31.9  RDW 14.8  PLT 177   Cardiac EnzymesNo results for input(s): TROPONINI in the last 168 hours.  Recent Labs Lab 06/08/17 0654  TROPIPOC 0.00    BNPNo results for input(s): BNP, PROBNP in the last 168 hours.  DDimer No results for input(s): DDIMER in the last 168 hours.  Radiology/Studies:  Dg Chest 2 View  Result Date: 06/08/2017 CLINICAL DATA:  Left-sided chest pain for 3 days. EXAM: CHEST  2 VIEW  COMPARISON:  10/11/2016 FINDINGS: Upper normal heart size. Low volumes with bibasilar atelectasis. Retrocardiac linear opacities likely represent volume loss. Postop changes. No pneumothorax. No pleural effusion. No sign of interstitial edema. IMPRESSION: Bibasilar atelectasis. Electronically Signed   By: Marybelle Killings M.D.   On: 06/08/2017 07:30    Assessment and Plan:   1. Unstable angina with hx of CAD s/p CABG and multiple PCI - Symptoms improved with sublingual nitroglycerin however reoccurs. Worse episode this morning. His pain improved on IV nitroglycerin. Point-of-care troponin negative. EKG without acute ischemic changes. Will proceed with cath. If unchanged anatomy, will increase home imdur dose.   2. HTN - Stable  3. HLD - No results found for requested labs within last 8760 hours. Continue Zocor 40mg  qd. Lipid panel in AM.   4. OSA on CPAP  5. Palpitation - Review of telemetry shows sinus rhythm at rate of 50s to 60s. No arrhythmia noted. Continue to monitor.  Severity of Illness: The appropriate patient status for this patient is OBSERVATION. Observation status is judged to be reasonable and necessary in order to provide the required intensity of service to ensure the patient's safety. The patient's presenting symptoms, physical exam findings, and initial radiographic and laboratory data in the context of their medical condition is felt to place them at decreased risk for further clinical deterioration. Furthermore, it is anticipated that the patient will be medically stable for discharge from the hospital within 2 midnights of admission. The following factors support the patient status of observation.   " The patient's presenting symptoms include -  unstable angina  " The physical exam findings include - stable  " The initial radiographic and laboratory data are - normal      For questions or updates, please contact [place] Please consult [email or web address] for contact info  under Cardiology/STEMI.    Jarrett Soho, PA  06/08/2017 9:13 AM   Personally seen and examined. Agree with above.  Exertional anginal symptoms relieved with nitroglycerin and rest. Multiple PCI's/CABG  Exam: Alert and oriented 3, pleasant, currently comfortable with his wife at bedside, lungs are clear, heart regular rate and rhythm without any significant murmurs rubs or gallops, abdomen obese, prior bypass scar noted, lower extremity vein extraction site noted.  EKG personally reviewed-nonspecific ST-T wave changes Prior cardiac catheterization reviewed 7154  71 year old male with known coronary artery disease status post multiple PCI's/CABG in 1989 here with symptoms of unstable angina.   - Plan for cardiac catheterization, risks and benefits have been reviewed including stroke, heart attack, death, renal impairment, bleeding.  - Continue with IV nitroglycerin  - Troponin thus far negative  Obesity  - Continue to encourage weight loss.  CAD-prior catheterization reviewed.  Candee Furbish, MD

## 2017-06-08 NOTE — ED Notes (Signed)
Pt state chest pain contineus 1/10.

## 2017-06-08 NOTE — Interval H&P Note (Signed)
Cath Lab Visit (complete for each Cath Lab visit)  Clinical Evaluation Leading to the Procedure:   ACS: Yes.    Non-ACS:    Anginal Classification: CCS III  Anti-ischemic medical therapy: Minimal Therapy (1 class of medications)  Non-Invasive Test Results: No non-invasive testing performed  Prior CABG: Previous CABG      History and Physical Interval Note:  06/08/2017 2:52 PM  [name]  has presented today for surgery, with the diagnosis of unstable angina  The various methods of treatment have been discussed with the patient and family. After consideration of risks, benefits and other options for treatment, the patient has consented to  Procedure(s): LEFT HEART CATH AND CORS/GRAFTS ANGIOGRAPHY (N/A) as a surgical intervention .  The patient's history has been reviewed, patient examined, no change in status, stable for surgery.  I have reviewed the patient's chart and labs.  Questions were answered to the patient's satisfaction.     Quay Burow

## 2017-06-09 ENCOUNTER — Encounter (HOSPITAL_COMMUNITY): Payer: Self-pay | Admitting: Cardiovascular Disease

## 2017-06-09 DIAGNOSIS — G4733 Obstructive sleep apnea (adult) (pediatric): Secondary | ICD-10-CM | POA: Diagnosis not present

## 2017-06-09 DIAGNOSIS — I200 Unstable angina: Secondary | ICD-10-CM | POA: Diagnosis not present

## 2017-06-09 DIAGNOSIS — R002 Palpitations: Secondary | ICD-10-CM | POA: Diagnosis not present

## 2017-06-09 DIAGNOSIS — Z955 Presence of coronary angioplasty implant and graft: Secondary | ICD-10-CM | POA: Diagnosis not present

## 2017-06-09 DIAGNOSIS — E785 Hyperlipidemia, unspecified: Secondary | ICD-10-CM | POA: Diagnosis not present

## 2017-06-09 DIAGNOSIS — I2510 Atherosclerotic heart disease of native coronary artery without angina pectoris: Secondary | ICD-10-CM

## 2017-06-09 DIAGNOSIS — I25110 Atherosclerotic heart disease of native coronary artery with unstable angina pectoris: Secondary | ICD-10-CM | POA: Diagnosis not present

## 2017-06-09 DIAGNOSIS — Z888 Allergy status to other drugs, medicaments and biological substances status: Secondary | ICD-10-CM | POA: Diagnosis not present

## 2017-06-09 DIAGNOSIS — I10 Essential (primary) hypertension: Secondary | ICD-10-CM | POA: Diagnosis not present

## 2017-06-09 DIAGNOSIS — K219 Gastro-esophageal reflux disease without esophagitis: Secondary | ICD-10-CM | POA: Diagnosis not present

## 2017-06-09 DIAGNOSIS — M479 Spondylosis, unspecified: Secondary | ICD-10-CM | POA: Diagnosis not present

## 2017-06-09 LAB — BASIC METABOLIC PANEL
Anion gap: 6 (ref 5–15)
BUN: 15 mg/dL (ref 6–20)
CALCIUM: 8.7 mg/dL — AB (ref 8.9–10.3)
CO2: 28 mmol/L (ref 22–32)
CREATININE: 0.87 mg/dL (ref 0.61–1.24)
Chloride: 105 mmol/L (ref 101–111)
GFR calc Af Amer: >60 mL/min (ref >60)
GLUCOSE: 94 mg/dL (ref 65–99)
Potassium: 4.4 mmol/L (ref 3.5–5.1)
SODIUM: 139 mmol/L (ref 135–145)

## 2017-06-09 LAB — CBC
HEMATOCRIT: 45.9 % (ref 39.0–52.0)
Hemoglobin: 14.6 g/dL (ref 13.0–17.0)
MCH: 24.4 pg — ABNORMAL LOW (ref 26.0–34.0)
MCHC: 31.8 g/dL (ref 30.0–36.0)
MCV: 76.8 fL — ABNORMAL LOW (ref 78.0–100.0)
PLATELETS: 168 10*3/uL (ref 150–400)
RBC: 5.98 MIL/uL — ABNORMAL HIGH (ref 4.22–5.81)
RDW: 14.9 % (ref 11.5–15.5)
WBC: 6.8 10*3/uL (ref 4.0–10.5)

## 2017-06-09 LAB — LIPID PANEL
CHOLESTEROL: 136 mg/dL (ref 0–200)
HDL: 36 mg/dL — ABNORMAL LOW (ref >40)
LDL CALC: 78 mg/dL (ref 0–99)
TRIGLYCERIDES: 112 mg/dL (ref <150)
Total CHOL/HDL Ratio: 3.8 RATIO
VLDL: 22 mg/dL (ref 0–40)

## 2017-06-09 MED ORDER — ISOSORBIDE MONONITRATE ER 60 MG PO TB24: 60.0000 mg | ORAL_TABLET | Freq: Every day | ORAL | 6 refills | Status: DC

## 2017-06-09 MED ORDER — ISOSORBIDE MONONITRATE ER 60 MG PO TB24: 30.0000 mg | ORAL_TABLET | Freq: Every day | ORAL | 6 refills | Status: DC

## 2017-06-09 NOTE — Progress Notes (Signed)
Progress Note  Patient Name: Robert [name] Date of Encounter: 06/09/2017  Primary Cardiologist: Dr. Burt Knack  Subjective   Feeling well. No chest pain, sob or palpitations.   Inpatient Medications    Scheduled Meds: . amitriptyline  25 mg Oral QHS  . aspirin  81 mg Oral Daily  . atenolol  25 mg Oral QHS  . atorvastatin  40 mg Oral q1800  . clopidogrel  75 mg Oral Daily  . heparin  5,000 Units Subcutaneous Q8H  . lisinopril  10 mg Oral QHS  . pantoprazole  40 mg Oral Daily  . sodium chloride flush  3 mL Intravenous Q12H  . sulindac  200 mg Oral BID   Continuous Infusions: . sodium chloride    . nitroGLYCERIN Stopped (06/08/17 1533)   PRN Meds: sodium chloride, acetaminophen, albuterol, morphine injection, nitroGLYCERIN, ondansetron (ZOFRAN) IV, sodium chloride flush   Vital Signs    Vitals:   06/08/17 1800 06/08/17 1955 06/08/17 2232 06/09/17 0601  BP: 132/75 127/69 139/77 132/87  Pulse: 61 65 60 64  Resp: 19 16  11   Temp:    98.3 F (36.8 C)  TempSrc:    Oral  SpO2: 95% 95%  95%  Weight:    235 lb 14.3 oz (107 kg)  Height:        Intake/Output Summary (Last 24 hours) at 06/09/17 0930 Last data filed at 06/09/17 0830  Gross per 24 hour  Intake              825 ml  Output             2700 ml  Net            -1875 ml   Filed Weights   06/08/17 0644 06/09/17 0601  Weight: 240 lb (108.9 kg) 235 lb 14.3 oz (107 kg)    Telemetry    SR at controlled rate  - Personally Reviewed  ECG    SR- Personally Reviewed  Physical Exam   GEN: No acute distress.   Neck: No JVD Cardiac: RRR, no murmurs, rubs, or gallops. R groin cath site without hematoma.  Respiratory: Clear to auscultation bilaterally. GI: Soft, nontender, non-distended  MS: No edema; No deformity. Neuro:  Nonfocal  Psych: Normal affect   Labs    Chemistry Recent Labs Lab 06/08/17 0654 06/09/17 0215  NA 136 139  K 4.0 4.4  CL 106 105  CO2 20* 28  GLUCOSE 155* 94  BUN 19 15    CREATININE 0.88 0.87  CALCIUM 8.5* 8.7*  GFRNONAA >60 >60  GFRAA >60 >60  ANIONGAP 10 6     Hematology Recent Labs Lab 06/08/17 0654 06/09/17 0215  WBC 5.8 6.8  RBC 5.87* 5.98*  HGB 14.5 14.6  HCT 45.5 45.9  MCV 77.5* 76.8*  MCH 24.7* 24.4*  MCHC 31.9 31.8  RDW 14.8 14.9  PLT 177 168     Recent Labs Lab 06/08/17 0654 06/08/17 1322  TROPIPOC 0.00 0.01     Radiology    Dg Chest 2 View  Result Date: 06/08/2017 CLINICAL DATA:  Left-sided chest pain for 3 days. EXAM: CHEST  2 VIEW COMPARISON:  10/11/2016 FINDINGS: Upper normal heart size. Low volumes with bibasilar atelectasis. Retrocardiac linear opacities likely represent volume loss. Postop changes. No pneumothorax. No pleural effusion. No sign of interstitial edema. IMPRESSION: Bibasilar atelectasis. Electronically Signed   By: Marybelle Killings M.D.   On: 06/08/2017 07:30    Cardiac Studies  LEFT HEART CATH AND CORS/GRAFTS ANGIOGRAPHY  Conclusion     Mid RCA to Dist RCA lesion, 100 %stenosed.  SVG.  Origin to Prox Graft lesion, 50 %stenosed.  Ost LAD lesion, 100 %stenosed.  LIMA.  Dist LAD lesion, 30 %stenosed.  Ost 2nd Mrg to 2nd Mrg lesion, 0 %stenosed.  2nd Mrg lesion, 40 %stenosed.  Prox RCA lesion, 80 %stenosed.  The left ventricular systolic function is normal.  LV end diastolic pressure is normal.  The left ventricular ejection fraction is 50-55% by visual estimate.  IMPRESSION: Mr. Cancio anatomy is unchanged from his cath last year. The circumflex obtuse marginal branch stent is widely patent. The RCA graft is widely patent as well as the stent in its origin with at most 40-50% "in-stent restenosis unchanged from prior cath. The diagonal vein graft was not visualized since it was known to be occluded in the past. The LIMA was large patent and filled the LAD with excellent filling of the diagonal branches. LV function was normal. There are no culprit lesions to explain his symptoms. His  groin was sealed with a MYNX device and he left the lab in stable condition. Diagnostic Diagram        Patient Profile     Robert [name] is a 71 y.o. male with a history of CAD s/p CABG 1989 and multiple PCI afterwards, hypertension, hyperlipidemia, OSA on CPAP and GERD presented for evaluation of chest pain.  Assessment & Plan     1. Unstable angina - His chest pain resolved with SL and IV nitro. Cath showed stable anatomy from prior cath. Here are no culprit lesions to explain his symptoms. No recurrent chest pain. Troponin negative. EKG without acute changes.  - Continue ASA, plavix, statin, BB and ACE. Ambulate. Increase imdur to 60mg  qd at discharge.   2. HLD - 06/09/2017: Cholesterol 136; HDL 36; LDL Cholesterol 78; Triglycerides 112; VLDL 22  - LDL close to goal. Continue statin.   3. HTN - Stable on current regimen  4. Palpitation - no arrhythmia noted on telemetry. No further evaluation needed.   For questions or updates, please contact [place] Please consult [email or web address] for contact info under Cardiology/STEMI.      Signed, Leanor Kail, PA  06/09/2017, 9:30 AM    Personally seen and examined. Agree with above.  No CP, ambulating, no SOB RRR, cathsite normal, CTAB, alert  Unstable angina  - resolved  - cath reassuring  - increased imdur to 60  HTN  - stable  HL  - stable  Ok for dc Candee Furbish, MD  Candee Furbish, MD

## 2017-06-09 NOTE — Discharge Summary (Signed)
Discharge Summary    Patient ID: Robert [name],  MRN: [id number], DOB/AGE: 1946/06/06 71 y.o.  Admit date: 06/08/2017 Discharge date: 06/09/2017  Primary Care Provider: Biagio Borg Primary Cardiologist: Dr. Burt Knack  Discharge Diagnoses    Active Problems:   Unstable angina ([place])   CAD   HLD   HTN   Palpitation   Allergies Allergies  Allergen Reactions  . Metoprolol Other (See Comments)    REACTION: severe bradycardia    Diagnostic Studies/Procedures    LEFT HEART CATH AND CORS/GRAFTS ANGIOGRAPHY  Conclusion     Mid RCA to Dist RCA lesion, 100 %stenosed.  SVG.  Origin to Prox Graft lesion, 50 %stenosed.  Ost LAD lesion, 100 %stenosed.  LIMA.  Dist LAD lesion, 30 %stenosed.  Ost 2nd Mrg to 2nd Mrg lesion, 0 %stenosed.  2nd Mrg lesion, 40 %stenosed.  Prox RCA lesion, 80 %stenosed.  The left ventricular systolic function is normal.  LV end diastolic pressure is normal.  The left ventricular ejection fraction is 50-55% by visual estimate.  IMPRESSION:[name]'s anatomy is unchanged from his cath last year. The circumflex obtuse marginal branch stent is widely patent. The RCA graft is widely patent as well as the stent in its origin with at most 40-50% "in-stent restenosis unchanged from prior cath. The diagonal vein graft was not visualized since it was known to be occluded in the past. The LIMA was large patent and filled the LAD with excellent filling of the diagonal branches. LV function was normal. There are no culprit lesions to explain his symptoms. His groin was sealed with a MYNX device and he left the lab in stable condition. Diagnostic Diagram         History of Present Illness     Robert [name] a 71 y.o.malewith a history of CAD s/p CABG 1989 and multiple PCI afterwards, hypertension, hyperlipidemia, OSA on CPAP and GERD presented for evaluation of chest pain.  Hospital Course     Consultants: None  1. Unstable  angina - His chest pain resolved with SL and IV nitro. Cath showed stable anatomy from prior cath. Here are no culprit lesions to explain his symptoms. No recurrent chest pain. Troponin negative. EKG without acute changes.  - Continue Plavix, statin, BB and ACE. Ambulated without angina. Increased imdur to 60mg  qd at discharge.   2. HLD - 06/09/2017: Cholesterol 136; HDL 36; LDL Cholesterol 78; Triglycerides 112; VLDL 22  - LDL close to goal. Continue statin.   3. HTN - Stable on current regimen  4. Palpitation - no arrhythmia noted on telemetry. No further evaluation needed.   The patient has been seen by Dr. Marlou Porch today and deemed ready for discharge home. All follow-up appointments have been scheduled. Discharge medications are listed below.   Discharge Vitals Blood pressure 132/87, pulse 64, temperature 98.3 F (36.8 C), temperature source Oral, resp. rate 11, height 6' (1.829 m), weight 235 lb 14.3 oz (107 kg), SpO2 95 %.  Filed Weights   06/08/17 0644 06/09/17 0601  Weight: 240 lb (108.9 kg) 235 lb 14.3 oz (107 kg)    Labs & Radiologic Studies     CBC  Recent Labs  06/08/17 0654 06/09/17 0215  WBC 5.8 6.8  HGB 14.5 14.6  HCT 45.5 45.9  MCV 77.5* 76.8*  PLT 177 124   Basic Metabolic Panel  Recent Labs  06/08/17 0654 06/09/17 0215  NA 136 139  K 4.0 4.4  CL 106 105  CO2 20* 28  GLUCOSE 155* 94  BUN 19 15  CREATININE 0.88 0.87  CALCIUM 8.5* 8.7*   Fasting Lipid Panel  Recent Labs  06/09/17 0215  CHOL 136  HDL 36*  LDLCALC 78  TRIG 112  CHOLHDL 3.8    Dg Chest 2 View  Result Date: 06/08/2017 CLINICAL DATA:  Left-sided chest pain for 3 days. EXAM: CHEST  2 VIEW COMPARISON:  10/11/2016 FINDINGS: Upper normal heart size. Low volumes with bibasilar atelectasis. Retrocardiac linear opacities likely represent volume loss. Postop changes. No pneumothorax. No pleural effusion. No sign of interstitial edema. IMPRESSION: Bibasilar atelectasis.  Electronically Signed   By: Marybelle Killings M.D.   On: 06/08/2017 07:30    Disposition   Pt is being discharged home today in good condition.  Follow-up Plans & Appointments    Follow-up Information    Sherren Mocha, MD Follow up.   Specialty:  Cardiology Why:  office will call with time and date. if you did not heard by Monday afternoon please call  Contact information: [address] [address] [phone]          Discharge Instructions    Diet - low sodium heart healthy    Complete by:  As directed    Discharge instructions    Complete by:  As directed    No driving for 48 hours. No lifting over 5 lbs for 1 week. No sexual activity for 1 week.  Keep procedure site clean & dry. If you notice increased pain, swelling, bleeding or pus, call/return!  You may shower, but no soaking baths/hot tubs/pools for 1 week.  Some studies suggest Prilosec/Omeprazole interacts with Plavix. We changed your Prilosec/Omeprazole to Protonix for less chance of interaction.   Increase activity slowly    Complete by:  As directed       Discharge Medications   Current Discharge Medication List    CONTINUE these medications which have CHANGED   Details  isosorbide mononitrate (IMDUR) 60 MG 24 hr tablet Take 1 tablet (60 mg total) by mouth daily. Qty: 30 tablet, Refills: 6      CONTINUE these medications which have NOT CHANGED   Details  amitriptyline (ELAVIL) 25 MG tablet Take 1 tablet (25 mg total) by mouth at bedtime. Reported on 01/25/2016 Qty: 90 tablet, Refills: 1    atenolol (TENORMIN) 25 MG tablet Take 1 tablet (25 mg total) by mouth at bedtime. Qty: 90 tablet, Refills: 1    clopidogrel (PLAVIX) 75 MG tablet TAKE 1 TABLET (75 MG TOTAL) BY MOUTH DAILY. Qty: 90 tablet, Refills: 0    lisinopril (PRINIVIL,ZESTRIL) 10 MG tablet TAKE 1 TABLET (10 MG TOTAL) BY MOUTH AT BEDTIME. Qty: 90 tablet, Refills: 1    nitroGLYCERIN (NITROSTAT) 0.4 MG SL tablet  Place 1 tablet (0.4 mg total) under the tongue every 5 (five) minutes as needed for chest pain. Qty: 25 tablet, Refills: 0   Associated Diagnoses: Coronary artery disease involving native coronary artery of native heart without angina pectoris    simvastatin (ZOCOR) 80 MG tablet Take 0.5 tablets (40 mg total) by mouth at bedtime. Qty: 45 tablet, Refills: 1    sulindac (CLINORIL) 200 MG tablet Take 200 mg by mouth 2 (two) times daily.     temazepam (RESTORIL) 30 MG capsule Take 1 capsule (30 mg total) by mouth at bedtime as needed for sleep. Qty: 30 capsule, Refills: 5    albuterol (PROAIR HFA) 108 (90 BASE) MCG/ACT inhaler Inhale  2 puffs into the lungs every 6 (six) hours as needed for wheezing or shortness of breath.     pantoprazole (PROTONIX) 40 MG tablet TAKE 1 TABLET EVERY DAY Qty: 90 tablet, Refills: 1      STOP taking these medications     omeprazole (PRILOSEC) 20 MG capsule           Outstanding Labs/Studies   None  Duration of Discharge Encounter   Greater than 30 minutes including physician time.  Signed, Bhagat,Bhavinkumar PA-C 06/09/2017, 10:55 AM  Personally seen and examined. Agree with above.  Cath no change from prior  - increased imdur to 60  No CP, ambulating, no SOB RRR, cathsite normal, CTAB, alert  Unstable angina  - resolved  - cath reassuring  - increased imdur to 60  HTN  - stable  HL  - stable  Ok for dc Candee Furbish, MD OK with DC

## 2017-06-09 NOTE — Care Management Note (Signed)
Case Management Note  Patient Details  Name: IZAIAS KRUPKA MRN: [id number] Date of Birth: Dec 21, 1945  Subjective/Objective:  From home, s/p left heart cath, angiography.                  Action/Plan: NCM will follow for dc needs.   Expected Discharge Date:  06/09/17               Expected Discharge Plan:  Home/Self Care  In-House Referral:     Discharge planning Services  CM Consult  Post Acute Care Choice:    Choice offered to:     DME Arranged:    DME Agency:     HH Arranged:    HH Agency:     Status of Service:  Completed, signed off  If discussed at [employer] of Stay Meetings, dates discussed:    Additional Comments:  Zenon Mayo, RN 06/09/2017, 8:59 AM

## 2017-06-09 NOTE — Progress Notes (Signed)
Discharge instructions (including medications) discussed with and copy provided to patient/caregiver 

## 2017-06-09 NOTE — Care Management Obs Status (Signed)
[facility] NOTIFICATION   Patient Details  Name: DEMETRIOUS RAINFORD MRN: [id number] Date of Birth: [birth year]   Medicare Observation Status Notification Given:  Yes    Zenon Mayo, RN 06/09/2017, 9:46 AM

## 2017-07-14 ENCOUNTER — Encounter: Payer: Self-pay | Admitting: Physician Assistant

## 2017-07-14 ENCOUNTER — Ambulatory Visit (INDEPENDENT_AMBULATORY_CARE_PROVIDER_SITE_OTHER): Payer: Medicare HMO | Admitting: Physician Assistant

## 2017-07-14 VITALS — BP 112/74 | HR 51 | Ht 72.0 in | Wt 245.8 lb

## 2017-07-14 DIAGNOSIS — E781 Pure hyperglyceridemia: Secondary | ICD-10-CM | POA: Diagnosis not present

## 2017-07-14 DIAGNOSIS — I10 Essential (primary) hypertension: Secondary | ICD-10-CM | POA: Diagnosis not present

## 2017-07-14 DIAGNOSIS — I25119 Atherosclerotic heart disease of native coronary artery with unspecified angina pectoris: Secondary | ICD-10-CM

## 2017-07-14 MED ORDER — ISOSORBIDE MONONITRATE ER 30 MG PO TB24: 30.0000 mg | ORAL_TABLET | Freq: Two times a day (BID) | ORAL | 3 refills | Status: DC

## 2017-07-14 NOTE — Patient Instructions (Signed)
Medication Instructions:  A REFILL WAS SENT IN FOR IMDUR  Labwork: NONE ORDERED TODAY  Testing/Procedures: NONE ORDERED TODAY  Follow-Up: Your physician wants you to follow-up in: [place] DR. Burt Knack.  You will receive a reminder letter in the mail two months in advance. If you don't receive a letter, please call our office to schedule the follow-up appointment.   Any Other Special Instructions Will Be Listed Below (If Applicable).     If you need a refill on your cardiac medications before your next appointment, please call your pharmacy.

## 2017-07-14 NOTE — Progress Notes (Signed)
Cardiology Office Note:    Date:  07/14/2017   ID:  Robert [name], DOB Mar 26, 1946, MRN [id number]  PCP:  Robert Borg, MD  Cardiologist:  Dr. Sherren [name]    Referring MD: No ref. provider found   Chief Complaint  Patient presents with  . Hospitalization Follow-up    Unstable angina, cardiac catheterization    History of Present Illness:    Robert [name] is a 71 y.o. male with a hx of coronary artery disease status post multivessel CABG in 1989 and multiple percutaneous coronary interventions since.  Last seen by Dr. Burt Knack 6/18.  He was admitted 9/20-9/21 with unstable angina.  Cardiac enzymes remained negative.  Cardiac catheterization was performed and demonstrated stable anatomy with patent OM stent, SVG-RCA stent, LIMA-LAD.  Medical therapy was continued.  Nitrate therapy dosage was adjusted.  Robert [name] returns for posthospitalization follow-up.  He is here alone.  Since discharge from the hospital, he has had infrequent angina.  He has not had to take nitroglycerin.  He seems to be doing better on higher dose nitrates.  He denies significant shortness of breath.  He denies syncope, paroxysmal nocturnal dyspnea or edema.  Prior CV studies:   The following studies were reviewed today:  Cardiac catheterization 06/08/17 LAD ostial 100, distal 30 LCx okay, OM 2 stent patent then 40 RCA proximal 80, mid 100 SVG-distal RCA stent patent with 50 ISR  LIMA-LAD patent SVG-diagonal known to be occluded EF 50-55   Past Medical History:  Diagnosis Date  . ALLERGIC RHINITIS 08/20/2007  . Anginal pain [facility]) May 13 2013   cath done=everything ok  . Arthritis    "back; knees; shoulders"  (06/08/2017)  . Asthma    "very slight"   . BENIGN PROSTATIC HYPERTROPHY 08/20/2007  . Coronary artery disease involving native heart with angina pectoris ([place])    a. s/p CABG x 3 in 1989;  b. MI in 1995 with BMS to VG->RCA;  c. 07/2000 PCI/BMS to native LCX;  d. Known CTO of  VG->Diag;  e. Mult caths w/ stable anatomy;  f. 04/2013 Cath: LM nl, ostLAD 100, LCX 10%isr, 70m, OM1/2/3 min irregs, RI min irregs, pRCA 80-90%/ 100& mid, VG->RCA ostial Stent w/ 20-30isr, LIMA->LAD ok, Occluded VG-Diag. EF 60%-->Med Rx.  . ELBOW PAIN, LEFT 04/15/2008  . ERECTILE DYSFUNCTION 08/20/2007  . GERD (gastroesophageal reflux disease)   . History of meningitis    2nd grade, 8th grade, 9th grade, age 88  . HYPERLIPIDEMIA TYPE I / IV 09/03/2008  . HYPERTENSION    "been on RX since CABG but never a problem" (06/08/2017)  . INSOMNIA-SLEEP DISORDER-UNSPEC 03/17/2008  . Microcytosis   . Myocardial infarction [facility]) June 1995  . OSA on CPAP   . PREMATURE VENTRICULAR CONTRACTIONS 08/20/2007  . SCROTAL MASS 10/09/2008  . URINARY INCONTINENCE, MALE 10/09/2008  . UTI 04/24/2009   "just this once" (05/15/2013)    Past Surgical History:  Procedure Laterality Date  . ANKLE FRACTURE SURGERY Right 1984  . BACK SURGERY    . CARDIAC CATHETERIZATION  X 2  . CARDIAC CATHETERIZATION N/A 04/01/2016   Procedure: Left Heart Cath and Cors/Grafts Angiography;  Surgeon: Robert Man, MD;  Location: [place] CV LAB;  Service: Cardiovascular;  Laterality: N/A;  . CARDIAC CATHETERIZATION N/A 04/01/2016   Procedure: Intravascular Pressure Wire/FFR Study;  Surgeon: Robert Man, MD;  Location: [place] CV LAB;  Service: Cardiovascular;  Laterality: N/A;  . CARDIAC CATHETERIZATION N/A 04/01/2016  Procedure: Coronary Stent Intervention;  Surgeon: Robert Man, MD;  Location: [place] CV LAB;  Service: Cardiovascular;  Laterality: N/A;  . CARDIAC CATHETERIZATION  06/08/2017  . COLONOSCOPY W/ BIOPSIES AND POLYPECTOMY  11/20/2003   adenomatous polyp, internal and external hemorrhoids  . CORONARY ANGIOPLASTY  95, 98, 2000, 2001  . [place]- 04/01/2016   "total of 4 stents counting today" (04/01/2016)  . CORONARY ARTERY BYPASS GRAFT  1989   triple bypass [facility]  .  CYSTOSCOPY  08/07/2009   and right hydrocelectomy  . FOOT TENDON SURGERY Left 2012   "donor tendon"   . FRACTURE SURGERY    . HEMI-MICRODISCECTOMY LUMBAR LAMINECTOMY LEVEL 1 Right 09/18/2013   Procedure: CENTRAL DECOMPRESSION @ L4-5 FOR SPINAL STENOSIS. EXCISION OF SYNOVIAL CYST @ L4-5 RIGHT, MICRODISCECTOMY @L4 -5 RIGHT ;  Surgeon: Robert Bastos, MD;  Location: [facility];  Service: Orthopedics;  Laterality: Right;  . INGUINAL HERNIA REPAIR Bilateral 1970's   "2 on one side; 1 on the other"   . LEFT HEART CATH AND CORS/GRAFTS ANGIOGRAPHY N/A 06/08/2017   Procedure: LEFT HEART CATH AND CORS/GRAFTS ANGIOGRAPHY;  Surgeon: Robert Harp, MD;  Location: [place] CV LAB;  Service: Cardiovascular;  Laterality: N/A;  . LEFT HEART CATHETERIZATION WITH CORONARY ANGIOGRAM Bilateral 05/15/2013   Procedure: LEFT HEART CATHETERIZATION WITH CORONARY ANGIOGRAM;  Surgeon: Robert Hampshire, MD;  Location: [place] CATH LAB;  Service: Cardiovascular;  Laterality: Bilateral;  . SHOULDER OPEN ROTATOR CUFF REPAIR Right 2009   [place]  . SURGERY SCROTAL / TESTICULAR Right 2012   "removed the sac" (05/15/2013)  . TONSILLECTOMY AND ADENOIDECTOMY  ~1961  . TRANSURETHRAL RESECTION OF PROSTATE  10/02/2009   [place] transurethral resection     Current Medications: Current Meds  Medication Sig  . albuterol (PROAIR HFA) 108 (90 BASE) MCG/ACT inhaler Inhale 2 puffs into the lungs every 6 (six) hours as needed for wheezing or shortness of breath.   Marland Kitchen amitriptyline (ELAVIL) 25 MG tablet Take 1 tablet (25 mg total) by mouth at bedtime. Reported on 01/25/2016  . atenolol (TENORMIN) 25 MG tablet Take 1 tablet (25 mg total) by mouth at bedtime.  . clopidogrel (PLAVIX) 75 MG tablet TAKE 1 TABLET (75 MG TOTAL) BY MOUTH DAILY.  . isosorbide mononitrate (IMDUR) 30 MG 24 hr tablet Take 1 tablet (30 mg total) by mouth 2 (two) times daily.  Marland Kitchen lisinopril (PRINIVIL,ZESTRIL) 10 MG tablet Take 10 mg by mouth daily.  . nitroGLYCERIN  (NITROSTAT) 0.4 MG SL tablet Place 1 tablet (0.4 mg total) under the tongue every 5 (five) minutes as needed for chest pain.  Marland Kitchen omeprazole (PRILOSEC) 20 MG capsule Take 20 mg by mouth daily.  . pantoprazole (PROTONIX) 40 MG tablet Take 40 mg by mouth daily.   . simvastatin (ZOCOR) 80 MG tablet Take 40 mg by mouth every evening.  . sulindac (CLINORIL) 200 MG tablet Take 200 mg by mouth 2 (two) times daily.   . temazepam (RESTORIL) 30 MG capsule Take 1 capsule (30 mg total) by mouth at bedtime as needed for sleep.  . [DISCONTINUED] isosorbide mononitrate (IMDUR) 30 MG 24 hr tablet Take 30 mg by mouth 2 (two) times daily.  . [DISCONTINUED] isosorbide mononitrate (IMDUR) 60 MG 24 hr tablet Take 1 tablet (60 mg total) by mouth daily.  . [DISCONTINUED] lisinopril (PRINIVIL,ZESTRIL) 10 MG tablet TAKE 1 TABLET (10 MG TOTAL) BY MOUTH AT BEDTIME. (Patient taking differently: Take 10 mg by mouth daily. )  . [  DISCONTINUED] pantoprazole (PROTONIX) 40 MG tablet TAKE 1 TABLET EVERY DAY (Patient taking differently: TAKE 1 TABLET daily as needed if you do not have Omeprazole)  . [DISCONTINUED] simvastatin (ZOCOR) 80 MG tablet Take 0.5 tablets (40 mg total) by mouth at bedtime. (Patient taking differently: Take 40 mg by mouth every evening. )     Allergies:   Metoprolol and Adhesive [tape]   Social History  Substance Use Topics  . Smoking status: Former Smoker    Packs/day: 1.00    Years: 45.00    Types: Cigarettes    Quit date: 04/23/1994  . Smokeless tobacco: Never Used  . Alcohol use Yes     Comment:  "I've  quit drinking in 1989"     Family Hx: The patient's family history includes Coronary artery disease in his father and mother; Esophageal cancer in his brother; Heart disease in his brother.  ROS:   Please see the history of present illness.    ROS All other systems reviewed and are negative.   EKGs/Labs/Other Test Reviewed:    EKG:  EKG is  ordered today.  The ekg ordered today demonstrates  sinus bradycardia, heart rate 51, normal axis, QTC 398 ms, inferior Q waves, no significant change when compared to prior tracings  Recent Labs: 06/09/2017: BUN 15; Creatinine, Ser 0.87; Hemoglobin 14.6; Platelets 168; Potassium 4.4; Sodium 139   Recent Lipid Panel Lab Results  Component Value Date/Time   CHOL 136 06/09/2017 02:15 AM   TRIG 112 06/09/2017 02:15 AM   HDL 36 (L) 06/09/2017 02:15 AM   CHOLHDL 3.8 06/09/2017 02:15 AM   LDLCALC 78 06/09/2017 02:15 AM    Physical Exam:    VS:  BP 112/74   Pulse (!) 51   Ht 6' (1.829 m)   Wt 245 lb 12.8 oz (111.5 kg)   SpO2 95%   BMI 33.34 kg/m     Wt Readings from Last 3 Encounters:  07/14/17 245 lb 12.8 oz (111.5 kg)  06/09/17 235 lb 14.3 oz (107 kg)  02/20/17 245 lb 12.8 oz (111.5 kg)     Physical Exam  Constitutional: He is oriented to person, place, and time. He appears well-developed and well-nourished. No distress.  HENT:  Head: Normocephalic and atraumatic.  Neck: No JVD present.  Cardiovascular: Normal rate and regular rhythm.   No murmur heard. Pulmonary/Chest: Effort normal. He has no wheezes. He has no rales.  Abdominal: Soft.  Musculoskeletal: He exhibits no edema or deformity (Right groin without hematoma or bruit).  Neurological: He is alert and oriented to person, place, and time.  Skin: Skin is warm and dry.    ASSESSMENT:    1. Coronary artery disease involving native coronary artery of native heart with angina pectoris ([place])   2. Essential hypertension   3. Hyperlipidemia type IV    PLAN:    In order of problems listed above:  1. Coronary artery disease involving native coronary artery of native heart with angina pectoris [facility]) Status post coronary artery bypass grafting in 1989 and multiple subsequent PCI procedures.  Recent cardiac catheterization during admission for unstable angina demonstrated stable anatomy stents in the vein graft to the RCA and native OM1 and patent LIMA-LAD.  His symptoms of  angina are improved on current therapy.  Continue current dose of beta-blocker, nitrates, clopidogrel, simvastatin.  2. Essential hypertension The patient's blood pressure is controlled on his current regimen.  Continue current therapy.   3. Hyperlipidemia type IV Continue statin.  Dispo:  Return in about 8 months (around 03/14/2018) for Routine Follow Up, w/ Dr. Burt Knack.   Medication Adjustments/Labs and Tests Ordered: Current medicines are reviewed at length with the patient today.  Concerns regarding medicines are outlined above.  Tests Ordered: Orders Placed This Encounter  Procedures  . EKG 12-Lead   Medication Changes: Meds ordered this encounter  Medications  . isosorbide mononitrate (IMDUR) 30 MG 24 hr tablet    Sig: Take 1 tablet (30 mg total) by mouth 2 (two) times daily.    Dispense:  90 tablet    Refill:  3    Signed, Richardson Dopp, PA-C  07/14/2017 11:10 AM    [facility] [address], [place], [place]  [address] Phone: [phone]; Fax: [phone]

## 2017-09-27 IMAGING — DX DG RIBS 2V*R*
4 series · 4 of 4 positions shown · non-contrast
Comparison: Chest radiograph performed 04/01/2016

CLINICAL DATA: Status post fall, with right anterior chest pain.
Initial encounter.

EXAM:
RIGHT RIBS - 2 VIEW

[rib pa (1 of 2)]
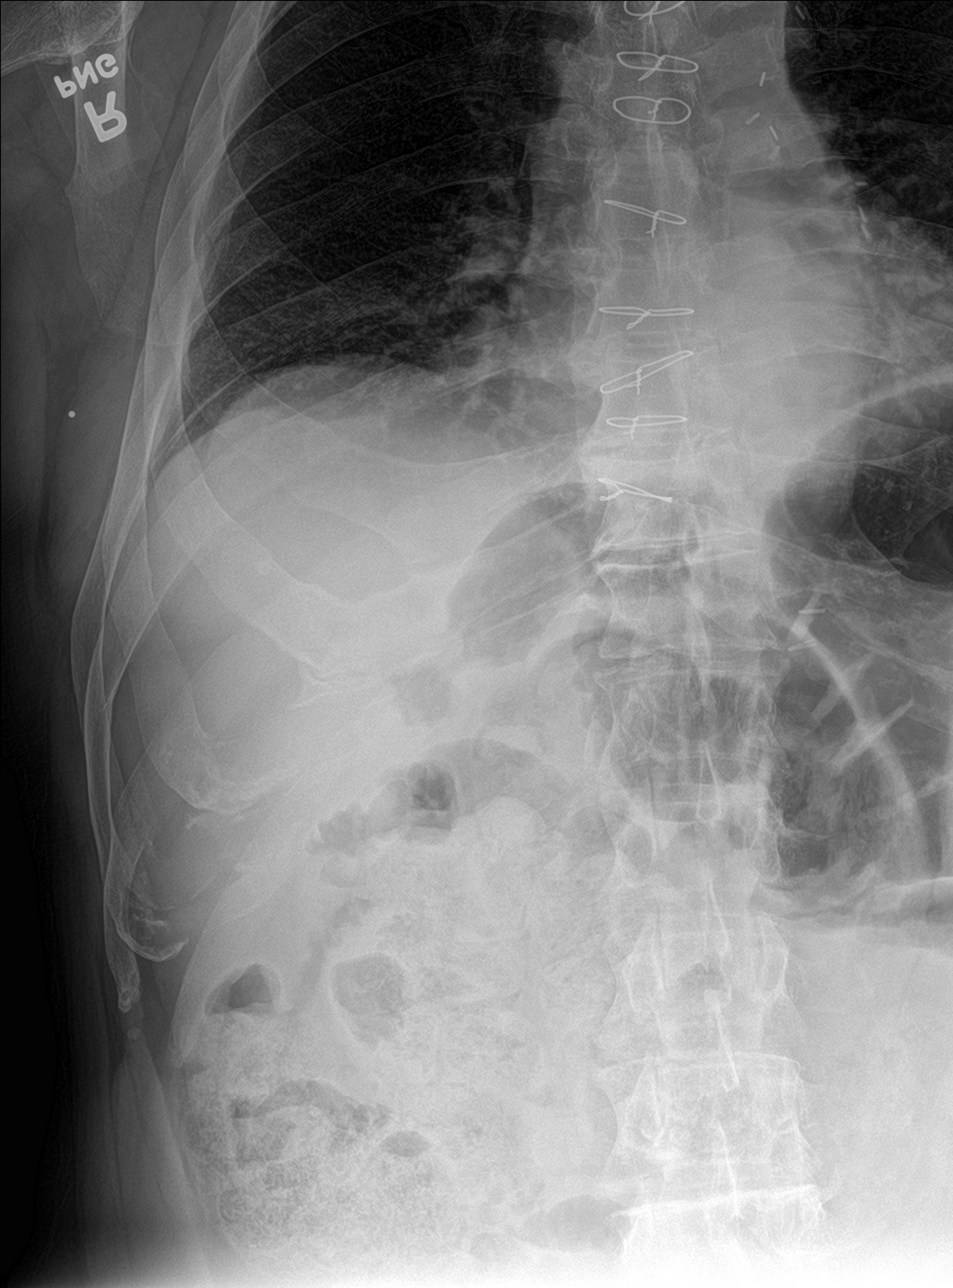

[rib pa (2 of 2)]
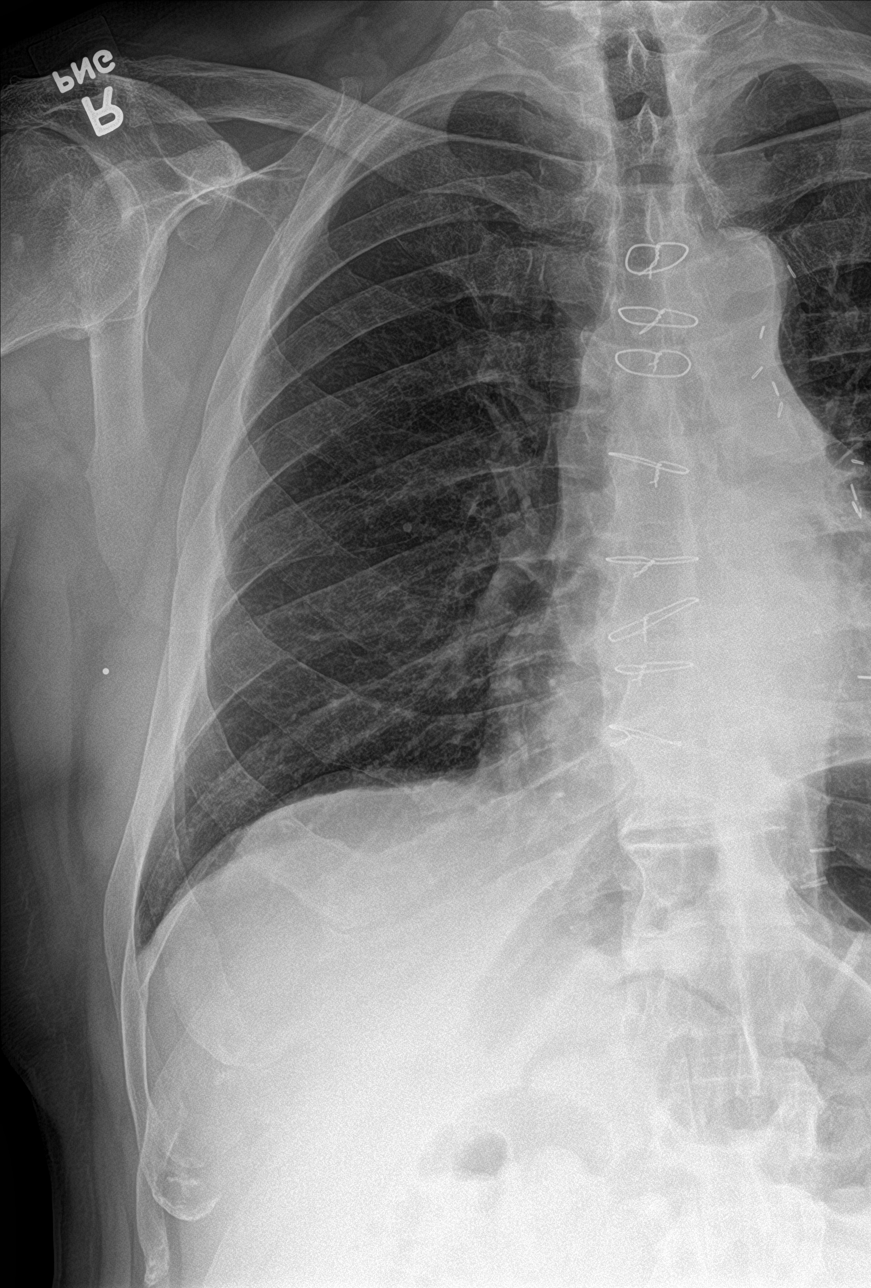

[rib obl (1 of 2)]
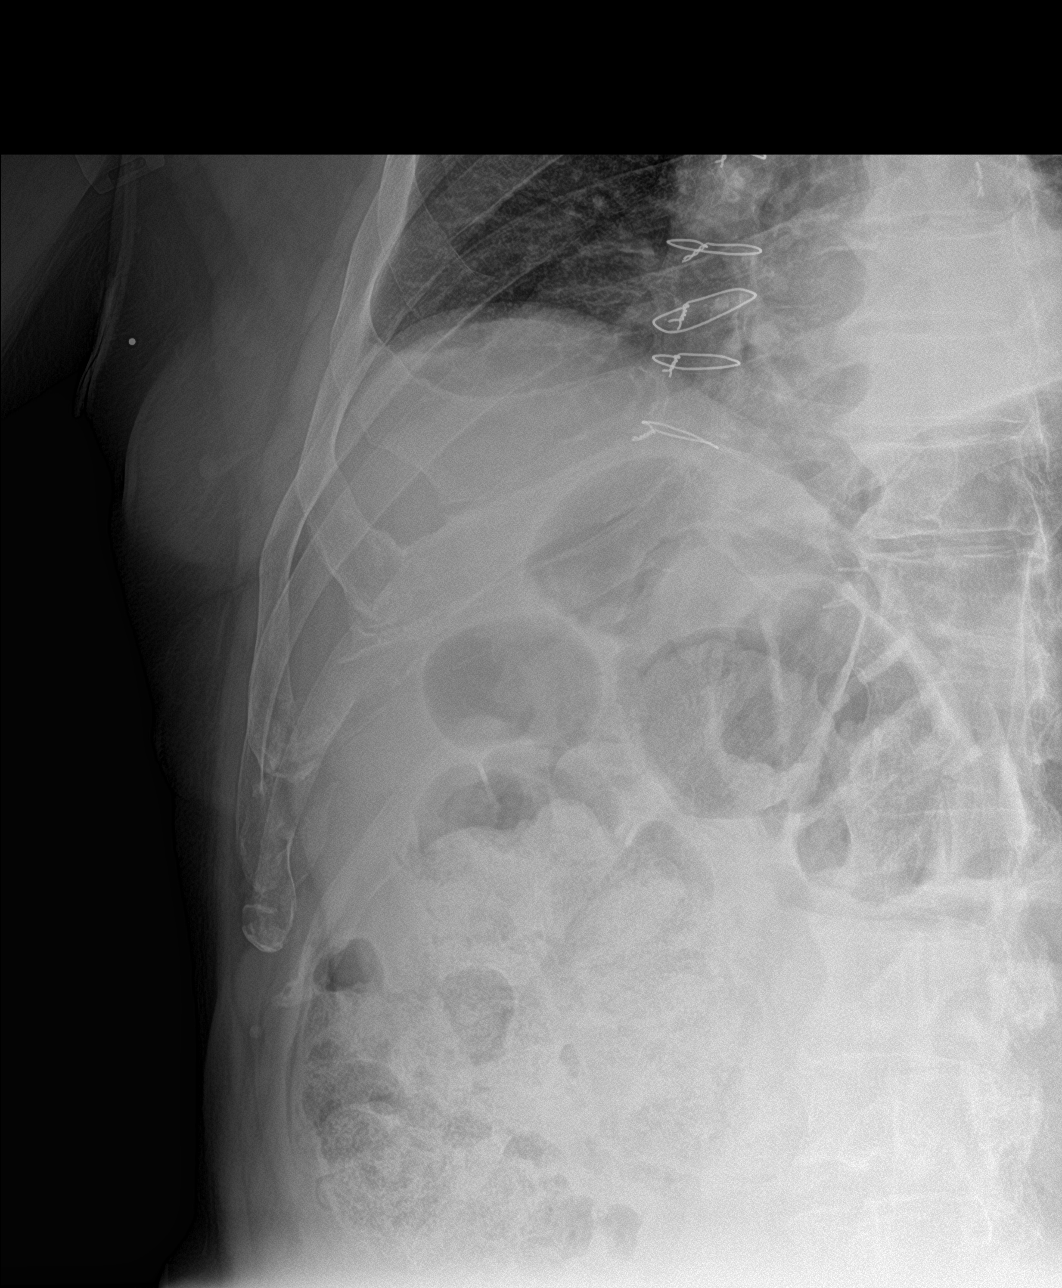

[rib obl (2 of 2)]
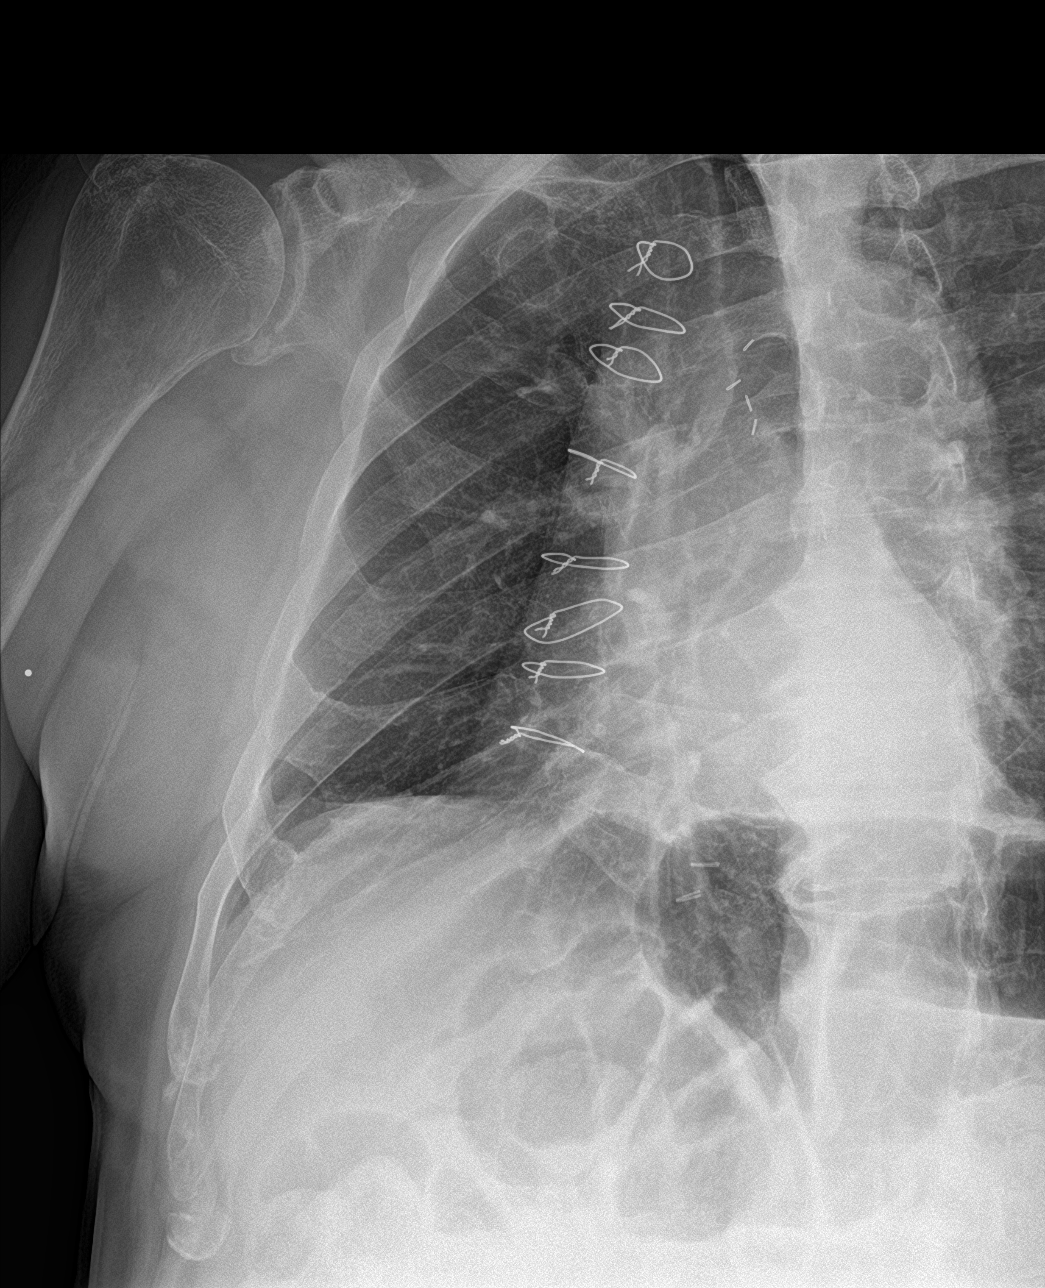

[4 of 4 positions shown; findings below may reference images not displayed]

FINDINGS: No displaced rib fractures are seen.

The visualized portions of the lungs are well-aerated and clear.
There is no evidence of focal opacification, pleural effusion or
pneumothorax.

The cardiomediastinal silhouette is normal in size. The patient is
status post median sternotomy. No acute osseous abnormalities are
seen.
IMPRESSION: No displaced rib fracture seen. No acute cardiopulmonary process
identified.

## 2017-10-09 ENCOUNTER — Ambulatory Visit (INDEPENDENT_AMBULATORY_CARE_PROVIDER_SITE_OTHER): Payer: Medicare HMO | Admitting: Internal Medicine

## 2017-10-09 ENCOUNTER — Encounter: Payer: Self-pay | Admitting: Internal Medicine

## 2017-10-09 VITALS — BP 136/88 | HR 61 | Temp 97.9°F | Ht 72.0 in | Wt 249.0 lb

## 2017-10-09 DIAGNOSIS — S61217A Laceration without foreign body of left little finger without damage to nail, initial encounter: Secondary | ICD-10-CM

## 2017-10-09 DIAGNOSIS — S61219A Laceration without foreign body of unspecified finger without damage to nail, initial encounter: Secondary | ICD-10-CM | POA: Insufficient documentation

## 2017-10-09 DIAGNOSIS — I10 Essential (primary) hypertension: Secondary | ICD-10-CM

## 2017-10-09 DIAGNOSIS — R7302 Impaired glucose tolerance (oral): Secondary | ICD-10-CM

## 2017-10-09 NOTE — Assessment & Plan Note (Signed)
Appears to be well healing with conservative tx, finger neurovasc intact, no evidence for cellulitis, no specific tx needed,  to f/u any worsening symptoms or concerns

## 2017-10-09 NOTE — Assessment & Plan Note (Signed)
BP Readings from Last 3 Encounters:  10/09/17 136/88  07/14/17 112/74  06/09/17 132/87  stable overall by history and exam, recent data reviewed with pt, and pt to continue medical treatment as before,  to f/u any worsening symptoms or concerns

## 2017-10-09 NOTE — Progress Notes (Signed)
Subjective:    Patient ID: Robert [name], male    DOB: Aug 27, 1946, 72 y.o.   MRN: [id number]  HPI  Here to f/u jsut post cruise to [place], where just night before disembarkation 1 wk ago, he stumbled and struck medial aspect of left 5th finger on a sharp metal surface bed decoration with rather large laceration.  Did not seek attention and continued next day on cruise.  Finger was dressed and bandaged with topical antibiotic, and despite the size and depth he succeeded in avoiding significant cellulitis or neurovascular compromise,  Wound now is healing, intact with mild swelling to prox finger only with little to no pain, just has some stiffness on flexion of the mcp and PIP with slightly reduced ROM.  No fever, drainage.  Pt denies chest pain, increased sob or doe, wheezing, orthopnea, PND, increased LE swelling, palpitations, dizziness or syncope.   Pt denies polydipsia, polyuria.  Last Tdap very recent late 2018 at the [place] Past Medical History:  Diagnosis Date  . ALLERGIC RHINITIS 08/20/2007  . Anginal pain [facility]) May 13 2013   cath done=everything ok  . Arthritis    "back; knees; shoulders"  (06/08/2017)  . Asthma    "very slight"   . BENIGN PROSTATIC HYPERTROPHY 08/20/2007  . Coronary artery disease involving native heart with angina pectoris ([place])    a. s/p CABG x 3 in 1989;  b. MI in 1995 with BMS to VG->RCA;  c. 07/2000 PCI/BMS to native LCX;  d. Known CTO of VG->Diag;  e. Mult caths w/ stable anatomy;  f. 04/2013 Cath: LM nl, ostLAD 100, LCX 10%isr, 39m, OM1/2/3 min irregs, RI min irregs, pRCA 80-90%/ 100& mid, VG->RCA ostial Stent w/ 20-30isr, LIMA->LAD ok, Occluded VG-Diag. EF 60%-->Med Rx.  . ELBOW PAIN, LEFT 04/15/2008  . ERECTILE DYSFUNCTION 08/20/2007  . GERD (gastroesophageal reflux disease)   . History of meningitis    2nd grade, 8th grade, 9th grade, age 82  . HYPERLIPIDEMIA TYPE I / IV 09/03/2008  . HYPERTENSION    "been on RX since CABG but never a problem"  (06/08/2017)  . INSOMNIA-SLEEP DISORDER-UNSPEC 03/17/2008  . Microcytosis   . Myocardial infarction [facility]) June 1995  . OSA on CPAP   . PREMATURE VENTRICULAR CONTRACTIONS 08/20/2007  . SCROTAL MASS 10/09/2008  . URINARY INCONTINENCE, MALE 10/09/2008  . UTI 04/24/2009   "just this once" (05/15/2013)   Past Surgical History:  Procedure Laterality Date  . ANKLE FRACTURE SURGERY Right 1984  . BACK SURGERY    . CARDIAC CATHETERIZATION  X 2  . CARDIAC CATHETERIZATION N/A 04/01/2016   Procedure: Left Heart Cath and Cors/Grafts Angiography;  Surgeon: Leonie Man, MD;  Location: [place] [facility];  Service: Cardiovascular;  Laterality: N/A;  . CARDIAC CATHETERIZATION N/A 04/01/2016   Procedure: Intravascular Pressure Wire/FFR Study;  Surgeon: Leonie Man, MD;  Location: [place] CV [facility];  Service: Cardiovascular;  Laterality: N/A;  . CARDIAC CATHETERIZATION N/A 04/01/2016   Procedure: Coronary Stent Intervention;  Surgeon: Leonie Man, MD;  Location: [place] CV LAB;  Service: Cardiovascular;  Laterality: N/A;  . CARDIAC CATHETERIZATION  06/08/2017  . COLONOSCOPY W/ BIOPSIES AND POLYPECTOMY  11/20/2003   adenomatous polyp, internal and external hemorrhoids  . CORONARY ANGIOPLASTY  95, 98, 2000, 2001  . [place]- 04/01/2016   "total of 4 stents counting today" (04/01/2016)  . CORONARY ARTERY BYPASS GRAFT  1989   triple bypass [facility]C  .  CYSTOSCOPY  08/07/2009   and right hydrocelectomy  . FOOT TENDON SURGERY Left 2012   "donor tendon"   . FRACTURE SURGERY    . HEMI-MICRODISCECTOMY LUMBAR LAMINECTOMY LEVEL 1 Right 09/18/2013   Procedure: CENTRAL DECOMPRESSION @ L4-5 FOR SPINAL STENOSIS. EXCISION OF SYNOVIAL CYST @ L4-5 RIGHT, MICRODISCECTOMY @L4 -5 RIGHT ;  Surgeon: Tobi Bastos, MD;  [facility]: [facility];  Service: Orthopedics;  Laterality: Right;  . INGUINAL HERNIA REPAIR Bilateral 1970's   "2 on one side; 1 on the other"   . LEFT HEART CATH AND  CORS/GRAFTS ANGIOGRAPHY N/A 06/08/2017   Procedure: LEFT HEART CATH AND CORS/GRAFTS ANGIOGRAPHY;  Surgeon: Lorretta Harp, MD;  Location: [place] CV [facility];  Service: Cardiovascular;  Laterality: N/A;  . LEFT HEART CATHETERIZATION WITH CORONARY ANGIOGRAM Bilateral 05/15/2013   Procedure: LEFT HEART CATHETERIZATION WITH CORONARY ANGIOGRAM;  Surgeon: Wellington Hampshire, MD;  Location: [place] CATH LAB;  Service: Cardiovascular;  Laterality: Bilateral;  . SHOULDER OPEN ROTATOR CUFF REPAIR Right 2009   Gioffre  . SURGERY SCROTAL / TESTICULAR Right 2012   "removed the sac" (05/15/2013)  . TONSILLECTOMY AND ADENOIDECTOMY  ~1961  . TRANSURETHRAL RESECTION OF PROSTATE  10/02/2009   Gyrus transurethral resection     reports that he quit smoking about 23 years ago. His smoking use included cigarettes. He has a 45.00 pack-year smoking history. he has never used smokeless tobacco. He reports that he drinks alcohol. He reports that he does not use drugs. family history includes Coronary artery disease in his father and mother; Esophageal cancer in his brother; Heart disease in his brother. Allergies  Allergen Reactions  . Metoprolol Other (See Comments)    REACTION: severe bradycardia  . Adhesive [Tape] Rash   Review of Systems  Constitutional: Negative for other unusual diaphoresis or sweats HENT: Negative for ear discharge or swelling Eyes: Negative for other worsening visual disturbances Respiratory: Negative for stridor or other swelling  Gastrointestinal: Negative for worsening distension or other blood Genitourinary: Negative for retention or other urinary change Musculoskeletal: Negative for other MSK pain or swelling Skin: Negative for color change or other new lesions Neurological: Negative for worsening tremors and other numbness  Psychiatric/Behavioral: Negative for worsening agitation or other fatigue All other system neg per pt    Objective:   Physical Exam BP 136/88   Pulse 61   Temp  97.9 F (36.6 C) (Oral)   Ht 6' (1.829 m)   Wt 249 lb (112.9 kg)   SpO2 100%   BMI 33.77 kg/m  VS noted,  Constitutional: Pt appears in NAD HENT: Head: NCAT.  Right Ear: External ear normal.  Left Ear: External ear normal.  Eyes: . Pupils are equal, round, and reactive to light. Conjunctivae and EOM are normal Nose: without d/c or deformity Neck: Neck supple. Gross normal ROM Cardiovascular: Normal rate and regular rhythm.   Pulmonary/Chest: Effort normal and breath sounds without rales or wheezing.  Abd:  Soft, NT, ND, + BS, no organomegaly Neurological: Pt is alert. At baseline orientation, motor grossly intact Skin: Skin is warm. No rashes, no LE edema, left finger medial aspect Wound now is healing, intact with mild swelling to prox finger only with little to no pain, just has some stiffness on flexion of the mcp and PIP with slightly reduced ROM; no neurovascular abnormality Psychiatric: Pt behavior is normal without agitation  No other exam findings    Assessment & Plan:

## 2017-10-09 NOTE — Assessment & Plan Note (Signed)
Lab Results  Component Value Date   HGBA1C 5.8 (H) 05/15/2013  stable overall by history and exam, recent data reviewed with pt, and pt to continue medical treatment as before,  to f/u any worsening symptoms or concerns

## 2017-10-09 NOTE — Patient Instructions (Signed)
Please continue all other medications as before, and refills have been done if requested.  Please have the pharmacy call with any other refills you may need.  Please continue your efforts at being more active, low cholesterol diet, and weight control.  Please keep your appointments with your specialists as you may have planned     

## 2017-10-25 ENCOUNTER — Other Ambulatory Visit: Payer: Self-pay | Admitting: Internal Medicine

## 2018-02-14 ENCOUNTER — Other Ambulatory Visit: Payer: Self-pay | Admitting: Internal Medicine

## 2018-03-29 DIAGNOSIS — H5203 Hypermetropia, bilateral: Secondary | ICD-10-CM | POA: Diagnosis not present

## 2018-04-03 ENCOUNTER — Telehealth: Payer: Self-pay

## 2018-04-03 NOTE — Telephone Encounter (Signed)
Received message that the patient wants to switch his appointment with Dr. Burt Knack 7/24 with his wife's appointment with Dayna on 8/5. Informed patient that Dr. Burt Knack will be out of the office 7/24. Offered him an appointment with Dr. Burt Knack 8/21. He states he will take the 8/5 appointment with Dayna and requested his wife take 8/21 appointment with Dr. Burt Knack. Schedule changes made.

## 2018-04-11 ENCOUNTER — Ambulatory Visit: Payer: Medicare HMO | Admitting: Cardiovascular Disease

## 2018-04-23 ENCOUNTER — Ambulatory Visit: Payer: Medicare HMO | Admitting: Physician Assistant

## 2018-05-15 ENCOUNTER — Other Ambulatory Visit: Payer: Self-pay | Admitting: Internal Medicine

## 2018-05-15 NOTE — Telephone Encounter (Signed)
Reviewed chart pt is up-to-date sent refills to [facility].../lmb  

## 2018-06-11 ENCOUNTER — Ambulatory Visit: Payer: Medicare HMO | Admitting: Physician Assistant

## 2018-06-15 DIAGNOSIS — M79672 Pain in left foot: Secondary | ICD-10-CM | POA: Diagnosis not present

## 2018-06-15 DIAGNOSIS — M545 Low back pain: Secondary | ICD-10-CM | POA: Diagnosis not present

## 2018-06-22 ENCOUNTER — Ambulatory Visit: Payer: Medicare HMO | Admitting: Cardiovascular Disease

## 2018-07-18 ENCOUNTER — Ambulatory Visit: Payer: Medicare HMO | Admitting: Physician Assistant

## 2018-07-18 ENCOUNTER — Encounter: Payer: Self-pay | Admitting: Physician Assistant

## 2018-07-18 VITALS — BP 122/80 | HR 71 | Ht 72.0 in | Wt 245.4 lb

## 2018-07-18 DIAGNOSIS — I10 Essential (primary) hypertension: Secondary | ICD-10-CM

## 2018-07-18 DIAGNOSIS — I25708 Atherosclerosis of coronary artery bypass graft(s), unspecified, with other forms of angina pectoris: Secondary | ICD-10-CM | POA: Diagnosis not present

## 2018-07-18 DIAGNOSIS — I493 Ventricular premature depolarization: Secondary | ICD-10-CM | POA: Diagnosis not present

## 2018-07-18 DIAGNOSIS — E785 Hyperlipidemia, unspecified: Secondary | ICD-10-CM | POA: Diagnosis not present

## 2018-07-18 DIAGNOSIS — I2510 Atherosclerotic heart disease of native coronary artery without angina pectoris: Secondary | ICD-10-CM

## 2018-07-18 DIAGNOSIS — Z951 Presence of aortocoronary bypass graft: Secondary | ICD-10-CM | POA: Diagnosis not present

## 2018-07-18 MED ORDER — ATORVASTATIN CALCIUM 40 MG PO TABS: 40.0000 mg | ORAL_TABLET | Freq: Every day | ORAL | 3 refills | Status: DC

## 2018-07-18 MED ORDER — NITROGLYCERIN 0.4 MG SL SUBL: 0.4000 mg | SUBLINGUAL_TABLET | SUBLINGUAL | 3 refills | Status: DC | PRN

## 2018-07-18 MED ORDER — PANTOPRAZOLE SODIUM 40 MG PO TBEC: 40.0000 mg | DELAYED_RELEASE_TABLET | Freq: Every day | ORAL | 0 refills | Status: DC

## 2018-07-18 MED ORDER — PANTOPRAZOLE SODIUM 40 MG PO TBEC: 40.0000 mg | DELAYED_RELEASE_TABLET | Freq: Every day | ORAL | 3 refills | Status: DC

## 2018-07-18 NOTE — Patient Instructions (Addendum)
Medication Instructions:  Your physician has recommended you make the following change in your medication:  1.  STOP the Simvastatin 2.  STOP the Prilosec 3.  START the Protonix 40 mg daily  4.  START the Atorvastatin 40 mg daily  If you need a refill on your cardiac medications before your next appointment, please call your pharmacy.   Lab work: [place] (08/29/18):  FASTING LIPID & LFT   If you have labs (blood work) drawn today and your tests are completely normal, you will receive your results only by: Marland Kitchen MyChart Message (if you have MyChart) OR . A paper copy in the mail If you have any lab test that is abnormal or we need to change your treatment, we will call you to review the results.  Testing/Procedures: None ordered  Follow-Up: Your physician wants you to follow-up in: [place] will receive a reminder letter in the mail two months in advance. If you don't receive a letter, please call our office to schedule the follow-up appointment.  Any Other Special Instructions Will Be Listed Below (If Applicable).  Some studies suggest Prilosec/Omeprazole interacts with Plavix. We changed your Prilosec/Omeprazole to the equivalent dose of Protonix for less chance of interaction.   Your record indicates you had an endoscopy in 2012 showing a condition called Barrett's esophagus where the inside of the esophagus lining can change over time. Dr. Celesta Aver note had planned a repeat study in a year but it is not clear from our records that you had that done. Please discuss with your team at the [place].

## 2018-07-18 NOTE — Progress Notes (Signed)
Cardiology Office Note    Date:  07/18/2018  ID:  Robert [name], Robert [name] 1946-03-18, MRN [id number] PCP:  Biagio Borg, MD  Cardiologist:  Sherren Mocha, MD   Chief Complaint: 1 year follow-up  History of Present Illness:  Robert [name] is a 72 y.o. male with history of CAD s/p multivessel CABG in 1989 and multiple percutaneous coronary interventions since, arthritis, ED, GERD, Barrett's esophagus by EGD 2012, HTN, HLD, OSA, PVCs who presents for 1 year follow-up.   He had bypass in 1989. He had MI 1995 and received BMS to VG-RCA. In 07/2010 he had BMS to native Cx. He has a known occlusion of VG-Diagonal. Last cath was in 05/2017, unchanged from previous, without culprit lesion to explain symptoms (patent circumflex OM stent, RCA graft widely patent as well as stent at most 40-5% ISR, patent LIMA-LAD, LVEF normal). Nitrate therapy dosage was adjusted. At last OV 06/2017 he was doing well, reporting infrequent angina and improvement on higher dose nitrates. At some point between that visit and last OV from 02/2017 ASA fell off his list. The patient thinks he stopped this himself due to easy bruising. Although previously directed to avoid Prilosec and use Protonix, he went back on Prilosec. He states he never filled the Protonix. Overall he is doing well without any interim change since last visit. He had 2 episodes of chest discomfort over the year in setting of lack of sleep but no exertional angina. This is consistent with prior frequency. He plays golf albeit cut down frequency due to caring for wife who had 2 small strokes this year. No dyspnea, edema, palpitations, syncope. He brings in labs from the [place] 04/2018 showing Hgb 15.4, Plt 227, Tchol 129, trig 78, HDL 42, LDL 78, glu 89, BUN 20, Cr 1.0, Na 139, K 4.7.  He knows Lucio Edward from church.  He follows with the [facility] as well.  Past Medical History:  Diagnosis Date  . ALLERGIC RHINITIS 08/20/2007  . Arthritis    "back; knees;  shoulders"  (06/08/2017)  . Asthma    "very slight"   . BENIGN PROSTATIC HYPERTROPHY 08/20/2007  . Coronary artery disease involving native heart with angina pectoris ([place])    a. s/p CABG x 3 in 1989;  b. MI in 1995 with BMS to VG->RCA;  c. 07/2000 PCI/BMS to native LCX;  d. Known CTO of VG->Diag;  e. Mult caths w/ stable anatomy;  f. 04/2013 Cath: LM nl, ostLAD 100, LCX 10%isr, 42m, OM1/2/3 min irregs, RI min irregs, pRCA 80-90%/ 100& mid, VG->RCA ostial Stent w/ 20-30isr, LIMA->LAD ok, Occluded VG-Diag. EF 60%-->Med Rx.  . ELBOW PAIN, LEFT 04/15/2008  . ERECTILE DYSFUNCTION 08/20/2007  . GERD (gastroesophageal reflux disease)   . History of meningitis    2nd grade, 8th grade, 9th grade, age 84  . HYPERLIPIDEMIA TYPE I / IV 09/03/2008  . HYPERTENSION    "been on RX since CABG but never a problem" (06/08/2017)  . INSOMNIA-SLEEP DISORDER-UNSPEC 03/17/2008  . Microcytosis   . Myocardial infarction [facility]) June 1995  . OSA on CPAP   . PREMATURE VENTRICULAR CONTRACTIONS 08/20/2007  . SCROTAL MASS 10/09/2008  . URINARY INCONTINENCE, MALE 10/09/2008  . UTI 04/24/2009   "just this once" (05/15/2013)    Past Surgical History:  Procedure Laterality Date  . ANKLE FRACTURE SURGERY Right 1984  . BACK SURGERY    . CARDIAC CATHETERIZATION  X 2  . CARDIAC CATHETERIZATION N/A 04/01/2016   Procedure: Left  Heart Cath and Cors/Grafts Angiography;  Surgeon: Leonie Man, MD;  Location: [place] CV LAB;  Service: Cardiovascular;  Laterality: N/A;  . CARDIAC CATHETERIZATION N/A 04/01/2016   Procedure: Intravascular Pressure Wire/FFR Study;  Surgeon: Leonie Man, MD;  Location: [place] [facility] LAB;  Service: Cardiovascular;  Laterality: N/A;  . CARDIAC CATHETERIZATION N/A 04/01/2016   Procedure: Coronary Stent Intervention;  Surgeon: Leonie Man, MD;  Location: [place] [facility] LAB;  Service: Cardiovascular;  Laterality: N/A;  . CARDIAC CATHETERIZATION  06/08/2017  . COLONOSCOPY W/ BIOPSIES AND POLYPECTOMY   11/20/2003   adenomatous polyp, internal and external hemorrhoids  . CORONARY ANGIOPLASTY  95, 98, 2000, 2001  . [place]- 04/01/2016   "total of 4 stents counting today" (04/01/2016)  . CORONARY ARTERY BYPASS GRAFT  1989   triple bypass [facility]C  . CYSTOSCOPY  08/07/2009   and right hydrocelectomy  . FOOT TENDON SURGERY Left 2012   "donor tendon"   . FRACTURE SURGERY    . HEMI-MICRODISCECTOMY LUMBAR LAMINECTOMY LEVEL 1 Right 09/18/2013   Procedure: CENTRAL DECOMPRESSION @ L4-5 FOR SPINAL STENOSIS. EXCISION OF SYNOVIAL CYST @ L4-5 RIGHT, MICRODISCECTOMY @L4 -5 RIGHT ;  Surgeon: Tobi Bastos, MD;  Location: [facility];  Service: Orthopedics;  Laterality: Right;  . INGUINAL HERNIA REPAIR Bilateral 1970's   "2 on one side; 1 on the other"   . LEFT HEART CATH AND CORS/GRAFTS ANGIOGRAPHY N/A 06/08/2017   Procedure: LEFT HEART CATH AND CORS/GRAFTS ANGIOGRAPHY;  Surgeon: Lorretta Harp, MD;  Location: [place] [facility];  Service: Cardiovascular;  Laterality: N/A;  . LEFT HEART CATHETERIZATION WITH CORONARY ANGIOGRAM Bilateral 05/15/2013   Procedure: LEFT HEART CATHETERIZATION WITH CORONARY ANGIOGRAM;  Surgeon: Wellington Hampshire, MD;  Location: [place] CATH LAB;  Service: Cardiovascular;  Laterality: Bilateral;  . SHOULDER OPEN ROTATOR CUFF REPAIR Right 2009   Gioffre  . SURGERY SCROTAL / TESTICULAR Right 2012   "removed the sac" (05/15/2013)  . TONSILLECTOMY AND ADENOIDECTOMY  ~1961  . TRANSURETHRAL RESECTION OF PROSTATE  10/02/2009   Gyrus transurethral resection     Current Medications: Current Meds  Medication Sig  . albuterol (PROAIR HFA) 108 (90 BASE) MCG/ACT inhaler Inhale 2 puffs into the lungs every 6 (six) hours as needed for wheezing or shortness of breath.   Marland Kitchen amitriptyline (ELAVIL) 25 MG tablet Take 1 tablet (25 mg total) by mouth at bedtime. Reported on 01/25/2016  . amoxicillin (AMOXIL) 500 MG capsule Take 500 mg by mouth 4 (four) times daily as needed.  Pt reports this is prescribed by his dentist with dental procedures. Does not have cardiac indication for this medication.  Marland Kitchen atenolol (TENORMIN) 25 MG tablet TAKE 1 TABLET AT BEDTIME  . clopidogrel (PLAVIX) 75 MG tablet TAKE 1 TABLET (75 MG TOTAL) BY MOUTH DAILY.  . isosorbide mononitrate (IMDUR) 30 MG 24 hr tablet Take 1 tablet (30 mg total) by mouth 2 (two) times daily.  Marland Kitchen lisinopril (PRINIVIL,ZESTRIL) 10 MG tablet TAKE 1 TABLET AT BEDTIME  . nitroGLYCERIN (NITROSTAT) 0.4 MG SL tablet Place 1 tablet (0.4 mg total) under the tongue every 5 (five) minutes as needed for chest pain.  Marland Kitchen omeprazole (PRILOSEC) 20 MG capsule Take 1 capsule (20 mg total) by mouth daily.  . simvastatin (ZOCOR) 80 MG tablet TAKE 1/2 TABLET AT BEDTIME  . sulindac (CLINORIL) 200 MG tablet Take 200 mg by mouth 2 (two) times daily.   . temazepam (RESTORIL) 30 MG capsule Take 1  capsule (30 mg total) by mouth at bedtime as needed for sleep.      Allergies:   Metoprolol and Adhesive [tape]   Social History   Socioeconomic History  . Marital status: Married    Spouse name: Not on file  . Number of children: 1  . Years of education: Not on file  . Highest education level: Not on file  Occupational History  . 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 Financial resource strain: Not on file  . Food insecurity:    Worry: Not on file    Inability: Not on file  . Transportation needs:    Medical: Not on file    Non-medical: Not on file  Tobacco Use  . Smoking status: Former Smoker    Packs/day: 1.00    Years: 45.00    Pack years: 45.00    Types: Cigarettes    Last attempt to quit: 04/23/1994    Years since quitting: 24.2  . Smokeless tobacco: Never Used  Substance and Sexual Activity  . Alcohol use: Yes    Comment:  "I've  quit drinking in 1989"  . Drug use: No  . Sexual activity: Yes  Lifestyle  . Physical activity:    Days per week: Not on file     Minutes per session: Not on file  . Stress: Not on file  Relationships  . Social connections:    Talks on phone: Not on file    Gets together: Not on file    Attends religious service: Not on file    Active member of club or organization: Not on file    Attends meetings of clubs or organizations: Not on file    Relationship status: Not on file  Other Topics Concern  . Not on file  Social History Narrative  . Not on file     Family History:  The patient's family history includes Coronary artery disease in his father and mother; Esophageal cancer in his brother; Heart disease in his brother.  ROS:   Please see the history of present illness. + issues with early waking insomnia. All other systems are reviewed and otherwise negative.    PHYSICAL EXAM:   VS:  BP 122/80   Pulse 71   Ht 6' (1.829 m)   Wt 245 lb 6.4 oz (111.3 kg)   BMI 33.28 kg/m   BMI: Body mass index is 33.28 kg/m. GEN: Well nourished, well developed WM, in no acute distress HEENT: normocephalic, atraumatic Neck: no JVD, carotid bruits, or masses Cardiac: RRR; no murmurs, rubs, or gallops, no edema  Respiratory:  clear to auscultation bilaterally, normal work of breathing GI: soft, nontender, nondistended, + BS MS: no deformity or atrophy Skin: warm and dry, no rash Neuro:  Alert and Oriented x 3, Strength and sensation are intact, follows commands Psych: euthymic mood, full affect  Wt Readings from Last 3 Encounters:  07/18/18 245 lb 6.4 oz (111.3 kg)  10/09/17 249 lb (112.9 kg)  07/14/17 245 lb 12.8 oz (111.5 kg)      Studies/Labs Reviewed:   EKG:  EKG was ordered today and personally reviewed by me and demonstrates NSR 71bpm, prior inferior infarct, no acute ST changes  Recent Labs: No results found for requested labs within last 8760 hours.   Lipid Panel    Component Value Date/Time   CHOL 136 06/09/2017 0215   TRIG 112 06/09/2017 0215  HDL 36 (L) 06/09/2017 0215   CHOLHDL 3.8 06/09/2017  0215   VLDL 22 06/09/2017 0215   LDLCALC 78 06/09/2017 0215    Additional studies/ records that were reviewed today include: Summarized above.   ASSESSMENT & PLAN:   1. CAD s/p CABG with chronic stable angina - doing very well. Will continue Plavix, BB, statin (except change statin agent below). He self discontinued ASA sometime last year citing easy bruising. I will review with Dr. Burt Knack if he is OK with the patient continuing Plavix monotherapy. I also reiterated recommendation to avoid Prilosec and use Protonix instead. It also appears in 2012 EGD report he was due for repeat testing in a year given consideration of Barrett's so I advised he review with his [place] team as he was unaware of that diagnosis. 2. Essential HTN - controlled. 3. Hyperlipidemia - LDL slightly above goal by labs in 04/2018. Will switch simvastatin to atorvastatin 40mg . Plan recheck LFTs/lipids in 6 weeks. 4. PVCs - listed in chart but quiescent.  Disposition: F/u with Dr. Burt Knack in 1 year.   Medication Adjustments/Labs and Tests Ordered: Current medicines are reviewed at length with the patient today.  Concerns regarding medicines are outlined above. Medication changes, Labs and Tests ordered today are summarized above and listed in the Patient Instructions accessible in Encounters.   Signed, Charlie Pitter, PA-C  07/18/2018 4:25 PM    [facility] [address], [place], [place]  [address] Phone: [phone]; Fax: [phone]

## 2018-07-18 NOTE — Progress Notes (Signed)
Yes I think as long as he's taking clopidogrel daily it is fine for him to be off of ASA. thanks

## 2018-07-19 ENCOUNTER — Telehealth: Payer: Self-pay | Admitting: Physician Assistant

## 2018-07-19 ENCOUNTER — Encounter

## 2018-07-19 NOTE — Telephone Encounter (Signed)
Called pt to make him aware that Dr. Burt Knack is ok with him coming off the ASA as long a he continues the Plavix. Pt verbalized understanding and thanked me for the follow up.

## 2018-07-19 NOTE — Telephone Encounter (Signed)
Please let pt know Dr Burt Knack stated he was OK with patient being off ASA as long as he continues Plavix (clopidogrel) daily. Deshante Cassell PA-C

## 2018-07-23 ENCOUNTER — Telehealth: Payer: Self-pay

## 2018-07-23 NOTE — Telephone Encounter (Signed)
Per Cover my meds: P/A not required for Pantoprazole 40mg  tablets  Michaell Cowing Key: [id number] - Rx #: [id number]   Outcome: Available without authorization

## 2018-07-31 DIAGNOSIS — M25562 Pain in left knee: Secondary | ICD-10-CM | POA: Diagnosis not present

## 2018-07-31 DIAGNOSIS — M1712 Unilateral primary osteoarthritis, left knee: Secondary | ICD-10-CM | POA: Diagnosis not present

## 2018-08-15 DIAGNOSIS — M25562 Pain in left knee: Secondary | ICD-10-CM | POA: Diagnosis not present

## 2018-08-28 DIAGNOSIS — M25562 Pain in left knee: Secondary | ICD-10-CM | POA: Diagnosis not present

## 2018-08-29 ENCOUNTER — Other Ambulatory Visit: Payer: Medicare HMO | Admitting: *Deleted

## 2018-08-29 DIAGNOSIS — E785 Hyperlipidemia, unspecified: Secondary | ICD-10-CM

## 2018-08-29 DIAGNOSIS — I493 Ventricular premature depolarization: Secondary | ICD-10-CM | POA: Diagnosis not present

## 2018-08-29 DIAGNOSIS — I2510 Atherosclerotic heart disease of native coronary artery without angina pectoris: Secondary | ICD-10-CM

## 2018-08-29 DIAGNOSIS — Z951 Presence of aortocoronary bypass graft: Secondary | ICD-10-CM | POA: Diagnosis not present

## 2018-08-29 DIAGNOSIS — I25708 Atherosclerosis of coronary artery bypass graft(s), unspecified, with other forms of angina pectoris: Secondary | ICD-10-CM | POA: Diagnosis not present

## 2018-08-29 DIAGNOSIS — I10 Essential (primary) hypertension: Secondary | ICD-10-CM

## 2018-08-30 LAB — HEPATIC FUNCTION PANEL
ALT: 23 IU/L (ref 0–44)
AST: 23 IU/L (ref 0–40)
Albumin: 4.4 g/dL (ref 3.5–4.8)
Alkaline Phosphatase: 111 IU/L (ref 39–117)
Bilirubin Total: 0.5 mg/dL (ref 0.0–1.2)
Bilirubin, Direct: 0.18 mg/dL (ref 0.00–0.40)
Total Protein: 6.9 g/dL (ref 6.0–8.5)

## 2018-08-30 LAB — LIPID PANEL
Chol/HDL Ratio: 3.1 ratio (ref 0.0–5.0)
Cholesterol, Total: 119 mg/dL (ref 100–199)
HDL: 38 mg/dL — ABNORMAL LOW (ref >39)
LDL CALC: 67 mg/dL (ref 0–99)
Triglycerides: 72 mg/dL (ref 0–149)
VLDL Cholesterol Cal: 14 mg/dL (ref 5–40)

## 2018-08-31 ENCOUNTER — Telehealth: Payer: Self-pay | Admitting: *Deleted

## 2018-08-31 DIAGNOSIS — M25562 Pain in left knee: Secondary | ICD-10-CM | POA: Diagnosis not present

## 2018-08-31 NOTE — Telephone Encounter (Signed)
-----   Message from Consuelo Pandy, [place] sent at 08/30/2018  7:43 PM EST ----- Covering Dayna's box. Let pt know that cholesterol is much better on Lipitor. His LDL (bad cholesterol) is improved and at goal of < 70. This is great. Liver function is normal. Continue on Lipitor.

## 2018-08-31 NOTE — Telephone Encounter (Signed)
Called pt re: lab results, left a message for pt to call back.  

## 2018-08-31 NOTE — Telephone Encounter (Signed)
-----   Message from Consuelo Pandy, [place] sent at 08/30/2018  7:43 PM EST ----- Covering Robert [name]'s box. Let pt know that cholesterol is much better on Lipitor. His LDL (bad cholesterol) is improved and at goal of < 70. This is great. Liver function is normal. Continue on Lipitor.

## 2018-08-31 NOTE — Telephone Encounter (Signed)
Pt returned my call and he has been made aware of his lab results and to continue current meds.

## 2018-09-05 ENCOUNTER — Encounter (HOSPITAL_COMMUNITY): Payer: Self-pay

## 2018-09-05 NOTE — Patient Instructions (Addendum)
Your procedure is scheduled on:  Tuesday, Dec. 31, 2019   Surgery Time:  1:00PM-2:02PM   Report to [place]  Entrance    Report to admitting at 11:00 AM   Call this number if you have problems the morning of surgery [phone]    Bring CPAP mask and tubing day of surgery   Do not eat food:After Midnight.    May have liquids until 7:00AM day of surgery  CLEAR LIQUID DIET  Foods Allowed                                                                     Foods Excluded  Water, Black Coffee and tea, regular and decaf                             liquids that you cannot  Plain Jell-O in any flavor                                             see through such as: Fruit ices (not with fruit pulp)                                     milk, soups, orange juice  Iced Popsicles                                    All solid food Carbonated beverages, regular and diet                                    Cranberry, grape and apple juices Sports drinks like Gatorade Lightly seasoned clear broth or consume(fat free) Sugar, honey syrup  Sample Menu Breakfast                                Lunch                                     Supper Cranberry juice                    Beef broth                            Chicken broth Jell-O                                     Grape juice                           Apple juice Coffee or tea  Jell-O                                      Popsicle                                                Coffee or tea                        Coffee or tea   Brush your teeth the morning of surgery.   Do NOT smoke after Midnight   Take these medicines the morning of surgery with A SIP OF WATER: Pantoprazole   Bring Asthma Inhaler day of surgery                               You may not have any metal on your body including jewelry, and body piercings             Do not wear lotions, powders, perfumes/cologne, or deodorant                          Men may shave face and neck.   Do not bring valuables to the hospital. [place].   Contacts, dentures or bridgework may not be worn into surgery.    Patients discharged the day of surgery will not be allowed to drive home.   Special Instructions: Bring a copy of your healthcare power of attorney and living will documents         the day of surgery if you haven't scanned them in before.              Please read over the following fact sheets you were given:  [facility] - Preparing for Surgery Before surgery, you can play an important role.  Because skin is not sterile, your skin needs to be as free of germs as possible.  You can reduce the number of germs on your skin by washing with CHG (chlorahexidine gluconate) soap before surgery.  CHG is an antiseptic cleaner which kills germs and bonds with the skin to continue killing germs even after washing. Please DO NOT use if you have an allergy to CHG or antibacterial soaps.  If your skin becomes reddened/irritated stop using the CHG and inform your nurse when you arrive at Short Stay. Do not shave (including legs and underarms) for at least 48 hours prior to the first CHG shower.  You may shave your face/neck.  Please follow these instructions carefully:  1.  Shower with CHG Soap the night before surgery and the  morning of surgery.  2.  If you choose to wash your hair, wash your hair first as usual with your normal  shampoo.  3.  After you shampoo, rinse your hair and body thoroughly to remove the shampoo.                             4.  Use CHG as you would any other liquid soap.  You can apply  chg directly to the skin and wash.  Gently with a scrungie or clean washcloth.  5.  Apply the CHG Soap to your body ONLY FROM THE NECK DOWN.   Do   not use on face/ open                           Wound or open sores. Avoid contact with eyes, ears mouth and   genitals (private parts).                        Wash face,  Genitals (private parts) with your normal soap.             6.  Wash thoroughly, paying special attention to the area where your    surgery  will be performed.  7.  Thoroughly rinse your body with warm water from the neck down.  8.  DO NOT shower/wash with your normal soap after using and rinsing off the CHG Soap.                9.  Pat yourself dry with a clean towel.            10.  Wear clean pajamas.            11.  Place clean sheets on your bed the night of your first shower and do not  sleep with pets. Day of Surgery : Do not apply any lotions/deodorants the morning of surgery.  Please wear clean clothes to the hospital/surgery center.  FAILURE TO FOLLOW THESE INSTRUCTIONS MAY RESULT IN THE CANCELLATION OF YOUR SURGERY  PATIENT SIGNATURE_________________________________  NURSE SIGNATURE__________________________________  ________________________________________________________________________   Robert [name]  An incentive spirometer is a tool that can help keep your lungs clear and active. This tool measures how well you are filling your lungs with each breath. Taking long deep breaths may help reverse or decrease the chance of developing breathing (pulmonary) problems (especially infection) following:  A long period of time when you are unable to move or be active. BEFORE THE PROCEDURE   If the spirometer includes an indicator to show your best effort, your nurse or respiratory therapist will set it to a desired goal.  If possible, sit up straight or lean slightly forward. Try not to slouch.  Hold the incentive spirometer in an upright position. INSTRUCTIONS FOR USE  1. Sit on the edge of your bed if possible, or sit up as far as you can in bed or on a chair. 2. Hold the incentive spirometer in an upright position. 3. Breathe out normally. 4. Place the mouthpiece in your mouth and seal your lips tightly around it. 5. Breathe in  slowly and as deeply as possible, raising the piston or the ball toward the top of the column. 6. Hold your breath for 3-5 seconds or for as long as possible. Allow the piston or ball to fall to the bottom of the column. 7. Remove the mouthpiece from your mouth and breathe out normally. 8. Rest for a few seconds and repeat Steps 1 through 7 at least 10 times every 1-2 hours when you are awake. Take your time and take a few normal breaths between deep breaths. 9. The spirometer may include an indicator to show your best effort. Use the indicator as a goal to work toward during each repetition. 10. After each set of 10 deep breaths, practice coughing to be sure your  lungs are clear. If you have an incision (the cut made at the time of surgery), support your incision when coughing by placing a pillow or rolled up towels firmly against it. Once you are able to get out of bed, walk around indoors and cough well. You may stop using the incentive spirometer when instructed by your caregiver.  RISKS AND COMPLICATIONS  Take your time so you do not get dizzy or light-headed.  If you are in pain, you may need to take or ask for pain medication before doing incentive spirometry. It is harder to take a deep breath if you are having pain. AFTER USE  Rest and breathe slowly and easily.  It can be helpful to keep track of a log of your progress. Your caregiver can provide you with a simple table to help with this. If you are using the spirometer at home, follow these instructions: [place] IF:   You are having difficultly using the spirometer.  You have trouble using the spirometer as often as instructed.  Your pain medication is not giving enough relief while using the spirometer.  You develop fever of 100.5 F (38.1 C) or higher. SEEK IMMEDIATE MEDICAL CARE IF:   You cough up bloody sputum that had not been present before.  You develop fever of 102 F (38.9 C) or greater.  You develop  worsening pain at or near the incision site. MAKE SURE YOU:   Understand these instructions.  Will watch your condition.  Will get help right away if you are not doing well or get worse. Document Released: 01/16/2007 Document Revised: 11/28/2011 Document Reviewed: 03/19/2007 [facility] Patient Information 2014 [place], [place].   ________________________________________________________________________

## 2018-09-05 NOTE — Pre-Procedure Instructions (Signed)
Last office visit note by D. Dunn P.A.  and EKG 07/18/2018 in epic.

## 2018-09-06 ENCOUNTER — Telehealth: Payer: Self-pay | Admitting: *Deleted

## 2018-09-06 NOTE — Telephone Encounter (Signed)
   Primary Cardiologist: Sherren Mocha, MD  Chart reviewed as part of pre-operative protocol coverage. Patient was contacted 09/06/2018 in reference to pre-operative risk assessment for pending surgery as outlined below.  Robert [name] was last seen on 07/18/18 by Melina Copa, PA.  Since that day, Robert [name] has done well w/o anginal symptoms. No CP or dyspnea. He is able to walk up a flight of stairs > 4 METS w/o exertional CP or dyspnea.   Therefore, based on ACC/AHA guidelines, the patient would be at acceptable risk for the planned procedure without further cardiovascular testing.   I will check with Dr. Burt Knack to see if ok to hold Plavix for procedure.   Lyda Jester, PA-C 09/06/2018, 3:44 PM

## 2018-09-06 NOTE — Telephone Encounter (Signed)
   [facility] Pre-operative Risk Assessment    Request for surgical clearance:  1. What type of surgery is being performed? LEFT KNEE ARTHROSCOPY W/MEDIAL MENISECTOMY   2. When is this surgery scheduled? 09/18/18   3. What type of clearance is required (medical clearance vs. Pharmacy clearance to hold med vs. Both)? BOTH   4. Are there any medications that need to be held prior to surgery and how long?PLAVIX   5. Practice name and name of physician performing surgery? [facility]   6. What is your office phone number [phone]    7.   What is your office fax number [phone]  8.   Anesthesia type (None, local, MAC, general) ? NONE LISTED; LEFT MESSAGE TO VERIFY    Julaine Hua 09/06/2018, 2:08 PM  _________________________________________________________________   (provider comments below)

## 2018-09-07 ENCOUNTER — Encounter (HOSPITAL_COMMUNITY)
Admission: RE | Admit: 2018-09-07 | Discharge: 2018-09-07 | Disposition: A | Discharge status: Home / Self Care | Payer: Medicare HMO | Source: Ambulatory Visit | Attending: Orthopedic Surgery | Admitting: Orthopedic Surgery

## 2018-09-07 ENCOUNTER — Other Ambulatory Visit: Payer: Self-pay

## 2018-09-07 ENCOUNTER — Encounter (HOSPITAL_COMMUNITY): Payer: Self-pay

## 2018-09-07 DIAGNOSIS — S83242A Other tear of medial meniscus, current injury, left knee, initial encounter: Secondary | ICD-10-CM | POA: Diagnosis not present

## 2018-09-07 DIAGNOSIS — Z01812 Encounter for preprocedural laboratory examination: Secondary | ICD-10-CM | POA: Insufficient documentation

## 2018-09-07 HISTORY — DX: Anemia, unspecified: D64.9

## 2018-09-07 HISTORY — DX: Other specified diseases of liver: K76.89

## 2018-09-07 HISTORY — DX: Cholesterolosis of gallbladder: K82.4

## 2018-09-07 HISTORY — DX: Personal history of other diseases of the circulatory system: Z86.79

## 2018-09-07 HISTORY — DX: Barrett's esophagus without dysplasia: K22.70

## 2018-09-07 LAB — COMPREHENSIVE METABOLIC PANEL
ALT: 24 U/L (ref 0–44)
AST: 26 U/L (ref 15–41)
Albumin: 4.2 g/dL (ref 3.5–5.0)
Alkaline Phosphatase: 101 U/L (ref 38–126)
Anion gap: 12 (ref 5–15)
BUN: 18 mg/dL (ref 8–23)
CO2: 24 mmol/L (ref 22–32)
Calcium: 8.8 mg/dL — ABNORMAL LOW (ref 8.9–10.3)
Chloride: 104 mmol/L (ref 98–111)
Creatinine, Ser: 0.84 mg/dL (ref 0.61–1.24)
GFR calc Af Amer: >60 mL/min (ref >60)
GFR calc non Af Amer: >60 mL/min (ref >60)
Glucose, Bld: 97 mg/dL (ref 70–99)
Potassium: 4 mmol/L (ref 3.5–5.1)
Sodium: 140 mmol/L (ref 135–145)
Total Bilirubin: 0.6 mg/dL (ref 0.3–1.2)
Total Protein: 6.9 g/dL (ref 6.5–8.1)

## 2018-09-07 LAB — CBC WITH DIFFERENTIAL/PLATELET
Abs Immature Granulocytes: 0.04 10*3/uL (ref 0.00–0.07)
Basophils Absolute: 0.1 10*3/uL (ref 0.0–0.1)
Basophils Relative: 1 %
Eosinophils Absolute: 0.2 10*3/uL (ref 0.0–0.5)
Eosinophils Relative: 2 %
HCT: 49.3 % (ref 39.0–52.0)
Hemoglobin: 15.1 g/dL (ref 13.0–17.0)
Immature Granulocytes: 1 %
Lymphocytes Relative: 16 %
Lymphs Abs: 1.3 10*3/uL (ref 0.7–4.0)
MCH: 23.6 pg — ABNORMAL LOW (ref 26.0–34.0)
MCHC: 30.6 g/dL (ref 30.0–36.0)
MCV: 76.9 fL — ABNORMAL LOW (ref 80.0–100.0)
Monocytes Absolute: 0.7 10*3/uL (ref 0.1–1.0)
Monocytes Relative: 8 %
Neutro Abs: 5.9 10*3/uL (ref 1.7–7.7)
Neutrophils Relative %: 72 %
Platelets: 216 10*3/uL (ref 150–400)
RBC: 6.41 MIL/uL — ABNORMAL HIGH (ref 4.22–5.81)
RDW: 18.1 % — ABNORMAL HIGH (ref 11.5–15.5)
WBC: 8.1 10*3/uL (ref 4.0–10.5)
nRBC: 0 % (ref 0.0–0.2)

## 2018-09-07 LAB — PROTIME-INR
INR: 1.05
Prothrombin Time: 13.6 seconds (ref 11.4–15.2)

## 2018-09-07 LAB — APTT: aPTT: 33 seconds (ref 24–36)

## 2018-09-07 NOTE — Pre-Procedure Instructions (Signed)
Cardiac clearance telephone encounter 09/06/2018 Lyda Jester P.A. in epic.

## 2018-09-07 NOTE — Pre-Procedure Instructions (Signed)
CBC/diff and CMP results 09/07/2018 sent to Dr. Gladstone Lighter via epic.

## 2018-09-10 ENCOUNTER — Telehealth: Payer: Self-pay | Admitting: Physician Assistant

## 2018-09-10 NOTE — Telephone Encounter (Signed)
New message   Pt just wants nurse to know that he stopped taking plavix.

## 2018-09-10 NOTE — Telephone Encounter (Signed)
Called patient, LVM advised to old plavix 5 days prior to surgery, left a call back number if any questions.

## 2018-09-10 NOTE — Telephone Encounter (Signed)
Ok to hold plavix 5 days prior to surgery. thx

## 2018-09-13 NOTE — Telephone Encounter (Signed)
Called patient and advised of Plavix. Patient verbalized understanding.

## 2018-09-14 NOTE — Progress Notes (Signed)
Cardiac clearance Brittainy Simmons PA 09/06/2018 received and placed in hard chart.

## 2018-09-18 ENCOUNTER — Encounter (HOSPITAL_COMMUNITY)
Admission: RE | Disposition: A | Discharge status: Home / Self Care | Payer: Self-pay | Source: Home / Self Care | Attending: Orthopedic Surgery

## 2018-09-18 ENCOUNTER — Ambulatory Visit (HOSPITAL_COMMUNITY): Payer: Medicare HMO | Admitting: Anesthesiology

## 2018-09-18 ENCOUNTER — Ambulatory Visit (HOSPITAL_COMMUNITY)
Admission: RE | Admit: 2018-09-18 | Discharge: 2018-09-18 | Disposition: A | Discharge status: Home / Self Care | Payer: Medicare HMO | Attending: Orthopedic Surgery | Admitting: Orthopedic Surgery

## 2018-09-18 ENCOUNTER — Encounter (HOSPITAL_COMMUNITY): Payer: Self-pay

## 2018-09-18 DIAGNOSIS — Z951 Presence of aortocoronary bypass graft: Secondary | ICD-10-CM | POA: Diagnosis not present

## 2018-09-18 DIAGNOSIS — Z6832 Body mass index (BMI) 32.0-32.9, adult: Secondary | ICD-10-CM | POA: Insufficient documentation

## 2018-09-18 DIAGNOSIS — J45909 Unspecified asthma, uncomplicated: Secondary | ICD-10-CM | POA: Diagnosis not present

## 2018-09-18 DIAGNOSIS — Z955 Presence of coronary angioplasty implant and graft: Secondary | ICD-10-CM | POA: Diagnosis not present

## 2018-09-18 DIAGNOSIS — K219 Gastro-esophageal reflux disease without esophagitis: Secondary | ICD-10-CM | POA: Diagnosis not present

## 2018-09-18 DIAGNOSIS — Z91048 Other nonmedicinal substance allergy status: Secondary | ICD-10-CM | POA: Diagnosis not present

## 2018-09-18 DIAGNOSIS — M1990 Unspecified osteoarthritis, unspecified site: Secondary | ICD-10-CM | POA: Insufficient documentation

## 2018-09-18 DIAGNOSIS — I2510 Atherosclerotic heart disease of native coronary artery without angina pectoris: Secondary | ICD-10-CM | POA: Insufficient documentation

## 2018-09-18 DIAGNOSIS — Z800 Family history of malignant neoplasm of digestive organs: Secondary | ICD-10-CM | POA: Insufficient documentation

## 2018-09-18 DIAGNOSIS — G4733 Obstructive sleep apnea (adult) (pediatric): Secondary | ICD-10-CM | POA: Diagnosis not present

## 2018-09-18 DIAGNOSIS — M48061 Spinal stenosis, lumbar region without neurogenic claudication: Secondary | ICD-10-CM | POA: Insufficient documentation

## 2018-09-18 DIAGNOSIS — Z888 Allergy status to other drugs, medicaments and biological substances status: Secondary | ICD-10-CM | POA: Diagnosis not present

## 2018-09-18 DIAGNOSIS — Z79899 Other long term (current) drug therapy: Secondary | ICD-10-CM | POA: Insufficient documentation

## 2018-09-18 DIAGNOSIS — I252 Old myocardial infarction: Secondary | ICD-10-CM | POA: Diagnosis not present

## 2018-09-18 DIAGNOSIS — Z8249 Family history of ischemic heart disease and other diseases of the circulatory system: Secondary | ICD-10-CM | POA: Insufficient documentation

## 2018-09-18 DIAGNOSIS — I25810 Atherosclerosis of coronary artery bypass graft(s) without angina pectoris: Secondary | ICD-10-CM | POA: Diagnosis not present

## 2018-09-18 DIAGNOSIS — E785 Hyperlipidemia, unspecified: Secondary | ICD-10-CM | POA: Insufficient documentation

## 2018-09-18 DIAGNOSIS — E669 Obesity, unspecified: Secondary | ICD-10-CM | POA: Diagnosis not present

## 2018-09-18 DIAGNOSIS — Z8661 Personal history of infections of the central nervous system: Secondary | ICD-10-CM | POA: Diagnosis not present

## 2018-09-18 DIAGNOSIS — I517 Cardiomegaly: Secondary | ICD-10-CM | POA: Diagnosis not present

## 2018-09-18 DIAGNOSIS — I10 Essential (primary) hypertension: Secondary | ICD-10-CM | POA: Insufficient documentation

## 2018-09-18 DIAGNOSIS — Z87891 Personal history of nicotine dependence: Secondary | ICD-10-CM | POA: Insufficient documentation

## 2018-09-18 DIAGNOSIS — M94262 Chondromalacia, left knee: Secondary | ICD-10-CM | POA: Insufficient documentation

## 2018-09-18 DIAGNOSIS — S83242A Other tear of medial meniscus, current injury, left knee, initial encounter: Secondary | ICD-10-CM | POA: Diagnosis not present

## 2018-09-18 DIAGNOSIS — X58XXXA Exposure to other specified factors, initial encounter: Secondary | ICD-10-CM | POA: Diagnosis not present

## 2018-09-18 DIAGNOSIS — G4700 Insomnia, unspecified: Secondary | ICD-10-CM | POA: Diagnosis not present

## 2018-09-18 HISTORY — PX: KNEE ARTHROSCOPY WITH MEDIAL MENISECTOMY: SHX5651

## 2018-09-18 SURGERY — ARTHROSCOPY, KNEE, WITH MEDIAL MENISCECTOMY
Anesthesia: General | Site: Knee | Laterality: Left

## 2018-09-18 MED ORDER — ONDANSETRON HCL 4 MG/2ML IJ SOLN
INTRAMUSCULAR | Status: DC | PRN
  Administered 2018-09-18: 4 mg via INTRAVENOUS

## 2018-09-18 MED ORDER — BACITRACIN ZINC 500 UNIT/GM EX OINT
TOPICAL_OINTMENT | CUTANEOUS | Status: AC
  Filled 2018-09-18: qty 28.35

## 2018-09-18 MED ORDER — LACTATED RINGERS IR SOLN
Status: DC | PRN
  Administered 2018-09-18: 6000 mL

## 2018-09-18 MED ORDER — OXYCODONE HCL 5 MG PO TABS
ORAL_TABLET | ORAL | Status: AC
  Filled 2018-09-18: qty 1

## 2018-09-18 MED ORDER — OXYCODONE HCL 5 MG PO TABS
5.0000 mg | ORAL_TABLET | Freq: Once | ORAL | Status: AC | PRN
  Administered 2018-09-18: 5 mg via ORAL

## 2018-09-18 MED ORDER — STERILE WATER FOR IRRIGATION IR SOLN
Status: DC | PRN
  Administered 2018-09-18: 500 mL

## 2018-09-18 MED ORDER — BACITRACIN ZINC 500 UNIT/GM EX OINT
TOPICAL_OINTMENT | CUTANEOUS | Status: DC | PRN
  Administered 2018-09-18: 1 via TOPICAL

## 2018-09-18 MED ORDER — FENTANYL CITRATE (PF) 100 MCG/2ML IJ SOLN: 25.0000 ug | INTRAMUSCULAR | Status: DC | PRN

## 2018-09-18 MED ORDER — LACTATED RINGERS IV SOLN
INTRAVENOUS | Status: DC
  Administered 2018-09-18 (×3): via INTRAVENOUS

## 2018-09-18 MED ORDER — LIDOCAINE 2% (20 MG/ML) 5 ML SYRINGE
INTRAMUSCULAR | Status: DC | PRN
  Administered 2018-09-18: 80 mg via INTRAVENOUS

## 2018-09-18 MED ORDER — ONDANSETRON HCL 4 MG/2ML IJ SOLN: 4.0000 mg | Freq: Once | INTRAMUSCULAR | Status: DC | PRN

## 2018-09-18 MED ORDER — FENTANYL CITRATE (PF) 250 MCG/5ML IJ SOLN
INTRAMUSCULAR | Status: AC
  Filled 2018-09-18: qty 5

## 2018-09-18 MED ORDER — FENTANYL CITRATE (PF) 100 MCG/2ML IJ SOLN
INTRAMUSCULAR | Status: DC | PRN
  Administered 2018-09-18 (×3): 50 ug via INTRAVENOUS

## 2018-09-18 MED ORDER — OXYCODONE HCL 5 MG/5ML PO SOLN
5.0000 mg | Freq: Once | ORAL | Status: AC | PRN
  Filled 2018-09-18: qty 5

## 2018-09-18 MED ORDER — BUPIVACAINE-EPINEPHRINE (PF) 0.25% -1:200000 IJ SOLN
INTRAMUSCULAR | Status: AC
  Filled 2018-09-18: qty 30

## 2018-09-18 MED ORDER — POVIDONE-IODINE 10 % EX SWAB
2.0000 "application " | Freq: Once | CUTANEOUS | Status: AC
  Administered 2018-09-18: 2 via TOPICAL

## 2018-09-18 MED ORDER — DEXAMETHASONE SODIUM PHOSPHATE 10 MG/ML IJ SOLN
INTRAMUSCULAR | Status: DC | PRN
  Administered 2018-09-18: 5 mg via INTRAVENOUS

## 2018-09-18 MED ORDER — PROPOFOL 10 MG/ML IV BOLUS
INTRAVENOUS | Status: AC
  Filled 2018-09-18: qty 20

## 2018-09-18 MED ORDER — CEFAZOLIN SODIUM-DEXTROSE 2-4 GM/100ML-% IV SOLN
2.0000 g | INTRAVENOUS | Status: DC
  Filled 2018-09-18: qty 100

## 2018-09-18 MED ORDER — PROPOFOL 10 MG/ML IV BOLUS
INTRAVENOUS | Status: DC | PRN
  Administered 2018-09-18: 160 mg via INTRAVENOUS

## 2018-09-18 MED ORDER — CHLORHEXIDINE GLUCONATE 4 % EX LIQD: 60.0000 mL | Freq: Once | CUTANEOUS | Status: DC

## 2018-09-18 MED ORDER — BUPIVACAINE-EPINEPHRINE 0.25% -1:200000 IJ SOLN
INTRAMUSCULAR | Status: DC | PRN
  Administered 2018-09-18: 30 mL

## 2018-09-18 SURGICAL SUPPLY — 35 items
BANDAGE ACE 4X5 VEL STRL LF (GAUZE/BANDAGES/DRESSINGS) ×2 IMPLANT
BANDAGE ACE 6X5 VEL STRL LF (GAUZE/BANDAGES/DRESSINGS) ×2 IMPLANT
BLADE GREAT WHITE 4.2 (BLADE) IMPLANT
BNDG COHESIVE 6X5 TAN STRL LF (GAUZE/BANDAGES/DRESSINGS) ×1 IMPLANT
BNDG GAUZE ELAST 4 BULKY (GAUZE/BANDAGES/DRESSINGS) ×2 IMPLANT
CLOTH BEACON ORANGE TIMEOUT ST (SAFETY) ×2 IMPLANT
COVER SURGICAL LIGHT HANDLE (MISCELLANEOUS) ×2 IMPLANT
DRAPE SHEET LG 3/4 BI-LAMINATE (DRAPES) ×2 IMPLANT
DRSG ADAPTIC 3X8 NADH LF (GAUZE/BANDAGES/DRESSINGS) ×2 IMPLANT
DRSG EMULSION OIL 3X3 NADH (GAUZE/BANDAGES/DRESSINGS) ×1 IMPLANT
DRSG PAD ABDOMINAL 8X10 ST (GAUZE/BANDAGES/DRESSINGS) ×5 IMPLANT
DURAPREP 26ML APPLICATOR (WOUND CARE) ×2 IMPLANT
ELECT REM PT RETURN 9FT ADLT (ELECTROSURGICAL) ×2
ELECTRODE REM PT RTRN 9FT ADLT (ELECTROSURGICAL) ×1 IMPLANT
GAUZE SPONGE 4X4 12PLY STRL (GAUZE/BANDAGES/DRESSINGS) ×2 IMPLANT
GLOVE BIOGEL PI IND STRL 7.5 (GLOVE) IMPLANT
GLOVE BIOGEL PI IND STRL 8.5 (GLOVE) ×1 IMPLANT
GLOVE BIOGEL PI INDICATOR 7.5 (GLOVE) ×2
GLOVE BIOGEL PI INDICATOR 8.5 (GLOVE) ×1
GLOVE ECLIPSE 8.0 STRL XLNG CF (GLOVE) ×2 IMPLANT
GLOVE SURG SS PI 7.0 STRL IVOR (GLOVE) ×1 IMPLANT
GOWN STRL REUS W/TWL LRG LVL3 (GOWN DISPOSABLE) ×1 IMPLANT
GOWN STRL REUS W/TWL XL LVL3 (GOWN DISPOSABLE) ×2 IMPLANT
KIT BASIN OR (CUSTOM PROCEDURE TRAY) ×1 IMPLANT
MANIFOLD NEPTUNE II (INSTRUMENTS) ×2 IMPLANT
PACK ARTHROSCOPY WL (CUSTOM PROCEDURE TRAY) ×2 IMPLANT
PACK ICE MAXI GEL EZY WRAP (MISCELLANEOUS) ×2 IMPLANT
PAD MASON LEG HOLDER (PIN) ×2 IMPLANT
PROBE BIPOLAR ATHRO 135MM 90D (MISCELLANEOUS) ×3 IMPLANT
SUT ETHILON 3 0 PS 1 (SUTURE) ×2 IMPLANT
TOWEL OR 17X26 10 PK STRL BLUE (TOWEL DISPOSABLE) ×2 IMPLANT
TUBING ARTHRO INFLOW-ONLY STRL (TUBING) ×2 IMPLANT
TUBING CONNECTING 10 (TUBING) ×1 IMPLANT
WAND HAND CNTRL MULTIVAC 90 (MISCELLANEOUS) IMPLANT
WRAP KNEE MAXI GEL POST OP (GAUZE/BANDAGES/DRESSINGS) ×2 IMPLANT

## 2018-09-18 NOTE — Discharge Instructions (Signed)
Office in Two weeks.Continue your Plavix,starting tomorrow.Ambulate with Crutches,full weight. Remove all dressings on Thursday and apply Band Aids daily.then you can shower.

## 2018-09-18 NOTE — Brief Op Note (Signed)
09/18/2018  2:01 PM  PATIENT:  Robert [name]  72 y.o. male  PRE-OPERATIVE DIAGNOSIS:  left knee medial meniscal tear  POST-OPERATIVE DIAGNOSIS:  left knee medial meniscal tear  PROCEDURE:  Procedure(s) with comments: DIAGNOSTIC KNEE ARTHROSCOPY WITH MEDIAL MENISECTOMY (Left) - 7min  SURGEON:  Surgeon(s) and Role:    Latanya Maudlin, MD - Primary  PHYSICIAN ASSISTANT: OR Tech.  ASSISTANTSOR Tech   ANESTHESIA:   general  EBL:  5cc   BLOOD ADMINISTERED:none  DRAINS: none   LOCAL MEDICATIONS USED:  MARCAINE 30cc of 0.25% with Epinephrine.    SPECIMEN:  No Specimen  DISPOSITION OF SPECIMEN:  N/A  COUNTS:  YES  TOURNIQUET:  * No tourniquets in log *  DICTATION: .Other Dictation: Dictation Number [id number]  PLAN OF CARE: Discharge to home after PACU  PATIENT DISPOSITION:  PACU - hemodynamically stable.   Delay start of Pharmacological VTE agent (>24hrs) due to surgical blood loss or risk of bleeding: yes

## 2018-09-18 NOTE — Interval H&P Note (Signed)
History and Physical Interval Note:  09/18/2018 12:39 PM  Robert [name]  has presented today for surgery, with the diagnosis of left knee medial meniscal tear  The various methods of treatment have been discussed with the patient and family. After consideration of risks, benefits and other options for treatment, the patient has consented to  Procedure(s) with comments: KNEE ARTHROSCOPY WITH MEDIAL MENISECTOMY (Left) - 69min as a surgical intervention .  The patient's history has been reviewed, patient examined, no change in status, stable for surgery.  I have reviewed the patient's chart and labs.  Questions were answered to the patient's satisfaction.     Latanya Maudlin

## 2018-09-18 NOTE — Anesthesia Preprocedure Evaluation (Addendum)
Anesthesia Evaluation  Patient identified by MRN, date of birth, ID band Patient awake    Reviewed: Allergy & Precautions, NPO status , Patient's Chart, lab work & pertinent test results  History of Anesthesia Complications Negative for: history of anesthetic complications  Airway Mallampati: II  TM Distance: >3 FB Neck ROM: Full    Dental  (+) Dental Advisory Given, Missing   Pulmonary asthma , sleep apnea and Continuous Positive Airway Pressure Ventilation , former smoker,    breath sounds clear to auscultation       Cardiovascular hypertension, Pt. on home beta blockers and Pt. 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 SVG. Origin to Prox Graft lesion, 50 %stenosed. Ost LAD lesion, 100 %stenosed. LIMA. Dist LAD lesion, 30 %stenosed. Ost 2nd Mrg to 2nd Mrg lesion, 0 %stenosed. 2nd Mrg lesion, 40 %stenosed. Prox RCA lesion, 80 %stenosed. The left ventricular systolic function is normal. LV end diastolic pressure is normal. The LVEF is 50-55% by visual estimate.    Neuro/Psych negative neurological ROS  negative psych ROS   GI/Hepatic Neg liver ROS, GERD  Medicated and Controlled,  Endo/Other   Obesity   Renal/GU negative Renal ROS     Musculoskeletal  (+) Arthritis ,   Abdominal   Peds  Hematology negative hematology ROS (+)   Anesthesia Other Findings   Reproductive/Obstetrics                            Anesthesia Physical Anesthesia Plan  ASA: III  Anesthesia Plan: General   Post-op Pain Management:    Induction: Intravenous  PONV Risk Score and Plan: 2 and Treatment may vary due to age or medical condition, Ondansetron and Dexamethasone  Airway Management Planned: LMA  Additional Equipment: None  Intra-op Plan:   Post-operative Plan:  Extubation in OR  Informed Consent: I have reviewed the patients History and Physical, chart, labs and discussed the procedure including the risks, benefits and alternatives for the proposed anesthesia with the patient or authorized representative who has indicated his/her understanding and acceptance.   Dental advisory given  Plan Discussed with: CRNA and Anesthesiologist  Anesthesia Plan Comments:        Anesthesia Quick Evaluation

## 2018-09-18 NOTE — Op Note (Signed)
NAMEJC, VERON MEDICAL RECORD [id number] ACCOUNT [id number] DATE OF BIRTH:08/13/1946 FACILITY: [facility] LOCATION: [facility] PHYSICIAN:Cotton Beckley Fransico Setters, MD  OPERATIVE REPORT  DATE OF PROCEDURE:  09/18/2018  SURGEON:  Latanya Maudlin, MD  ASSISTANT:  OR tech.  PREOPERATIVE DIAGNOSES:   1.  Early chondromalacia, left knee. 2.  Torn medial meniscus, posterior horn, left knee.  POSTOPERATIVE DIAGNOSES:   1.  Early chondromalacia, left knee. 2.  Torn medial meniscus posterior horn, left knee.  OPERATION: 1.  Diagnostic arthroscopy, left knee. 2.  Medial meniscectomy, left knee.  DESCRIPTION OF PROCEDURE:  The appropriate time-out was first carried out.  I marked the appropriate left leg in the holding area.  At this time, the patient had 2 grams of IV Ancef.  Following that, we placed in the left leg in a knee holder then did a  sterile prep and draping.  A small incision was made in the suprapatellar pouch.  Inflow cannula was inserted.  Knee was distended with saline.  At this particular time, we then inserted the inflow cannula and the knee was distended with saline.   Following that, another incision was made in the lateral aspect of the joint.  The arthroscope was entered from a lateral approach and a complete diagnostic arthroscopy was carried out.    The pertinent findings are as follows:   1.  He has early chondromalacia of the medial joint of the left knee. 2.  He had a large posterior horn tear of the medial meniscus, left knee.    At this time, I examined cruciate and they were intact.  I crossed over the midline.  I looked at the lateral joint and it was fine.  We went back to the medial joint and made a small incision there and then inserted the Arthrocare.  I then did a partial  medial meniscectomy of the posterior horn.  I then examined the remaining part of the meniscus and it was intact.  I thoroughly irrigated out the knee, closed all 3 punctate incisions with  3-0 nylon suture.  I injected 30 mL of 0.25% Marcaine with  epinephrine into the knee joint.  Sterile Neosporin bundle dressing was applied.  The patient left the operating room in satisfactory condition.  Note, he will go home, he is an outpatient.  He will resume his Plavix tomorrow.  He will see me in the  office in 2 weeks or prior to if there is a problem.  He can ambulate partial weightbearing with his crutches.  TN/NUANCE  D:09/18/2018 T:09/18/2018 JOB:[id number]

## 2018-09-18 NOTE — Transfer of Care (Signed)
Immediate Anesthesia Transfer of Care Note  Patient: Robert [name]  Procedure(s) Performed: DIAGNOSTIC KNEE ARTHROSCOPY WITH MEDIAL MENISECTOMY (Left Knee)  Patient Location: PACU  Anesthesia Type:General  Level of Consciousness: sedated, patient cooperative and responds to stimulation  Airway & Oxygen Therapy: Patient Spontanous Breathing and Patient connected to face mask oxygen  Post-op Assessment: Report given to RN and Post -op Vital signs reviewed and stable  Post vital signs: Reviewed and stable  Last Vitals:  Vitals Value Taken Time  BP    Temp    Pulse 77 09/18/2018  2:05 PM  Resp 14 09/18/2018  2:05 PM  SpO2 95 % 09/18/2018  2:05 PM  Vitals shown include unvalidated device data.  Last Pain:  Vitals:   09/18/18 1124  TempSrc:   PainSc: 0-No pain         Complications: No apparent anesthesia complications

## 2018-09-18 NOTE — Anesthesia Procedure Notes (Signed)
Procedure Name: LMA Insertion Performed by: Barre Aydelott J, CRNA Pre-anesthesia Checklist: Patient identified, Emergency Drugs available, Suction available, Patient being monitored and Timeout performed Patient Re-evaluated:Patient Re-evaluated prior to induction Oxygen Delivery Method: Circle system utilized Preoxygenation: Pre-oxygenation with 100% oxygen Induction Type: IV induction Ventilation: Mask ventilation without difficulty LMA: LMA inserted LMA Size: 4.0 Number of attempts: 1 Placement Confirmation: positive ETCO2 and breath sounds checked- equal and bilateral Tube secured with: Tape Dental Injury: Teeth and Oropharynx as per pre-operative assessment        

## 2018-09-18 NOTE — Interval H&P Note (Signed)
History and Physical Interval Note:  09/18/2018 12:40 PM  Robert [name]  has presented today for surgery, with the diagnosis of left knee medial meniscal tear  The various methods of treatment have been discussed with the patient and family. After consideration of risks, benefits and other options for treatment, the patient has consented to  Procedure(s) with comments: KNEE ARTHROSCOPY WITH MEDIAL MENISECTOMY (Left) - 57min as a surgical intervention .  The patient's history has been reviewed, patient examined, no change in status, stable for surgery.  I have reviewed the patient's chart and labs.  Questions were answered to the patient's satisfaction.     Latanya Maudlin

## 2018-09-18 NOTE — H&P (Signed)
Robert [name] is an 72 y.o. male.   Chief Complaint: Pain and swelling of his left Knee. HPI: Progressive pain and swelling of his Left knee.  Past Medical History:  Diagnosis Date  . ALLERGIC RHINITIS 08/20/2007  . Anemia   . Arthritis    "back; knees; shoulders"  (06/08/2017)  . Asthma    "very slight"   . Barrett esophagus   . BENIGN PROSTATIC HYPERTROPHY 08/20/2007  . Coronary artery disease involving native heart with angina pectoris ([place])    a. s/p CABG x 3 in 1989;  b. MI in 1995 with BMS to VG->RCA;  c. 07/2000 PCI/BMS to native LCX;  d. Known CTO of VG->Diag;  e. Mult caths w/ stable anatomy;  f. 04/2013 Cath: LM nl, ostLAD 100, LCX 10%isr, 57m, OM1/2/3 min irregs, RI min irregs, pRCA 80-90%/ 100& mid, VG->RCA ostial Stent w/ 20-30isr, LIMA->LAD ok, Occluded VG-Diag. EF 60%-->Med Rx.  . ELBOW PAIN, LEFT 04/15/2008  . ERECTILE DYSFUNCTION 08/20/2007  . Gallbladder polyp 08/27/2015   Noted on [place] Abd  . GERD (gastroesophageal reflux disease)   . Hepatic cyst 09/06/2015   Large simple right hepatic lobe, noted on [place] Abd  . History of cardiomegaly 05/15/2013   Noted on CXR  . History of meningitis    2nd grade, 8th grade, 9th grade, age 38  . HYPERLIPIDEMIA TYPE I / IV 09/03/2008  . HYPERTENSION    "been on RX since CABG but never a problem" (06/08/2017)  . INSOMNIA-SLEEP DISORDER-UNSPEC 03/17/2008  . Microcytosis   . Myocardial infarction [facility]) June 1995  . OSA on CPAP   . PREMATURE VENTRICULAR CONTRACTIONS 08/20/2007  . SCROTAL MASS 10/09/2008  . URINARY INCONTINENCE, MALE 10/09/2008  . UTI 04/24/2009   "just this once" (05/15/2013)    Past Surgical History:  Procedure Laterality Date  . ANKLE FRACTURE SURGERY Right 1984  . BACK SURGERY    . CARDIAC CATHETERIZATION  X 2  . CARDIAC CATHETERIZATION N/A 04/01/2016   Procedure: Left Heart Cath and Cors/Grafts Angiography;  Surgeon: Leonie Man, MD;  Location: [place] [facility];  Service: Cardiovascular;  Laterality: N/A;   . CARDIAC CATHETERIZATION N/A 04/01/2016   Procedure: Intravascular Pressure Wire/FFR Study;  Surgeon: Leonie Man, MD;  Location: [place] [facility];  Service: Cardiovascular;  Laterality: N/A;  . CARDIAC CATHETERIZATION N/A 04/01/2016   Procedure: Coronary Stent Intervention;  Surgeon: Leonie Man, MD;  Location: [place] [facility];  Service: Cardiovascular;  Laterality: N/A;  . CARDIAC CATHETERIZATION  06/08/2017  . COLONOSCOPY  08/31/2011  . COLONOSCOPY W/ BIOPSIES AND POLYPECTOMY  11/20/2003   adenomatous polyp, internal and external hemorrhoids  . CORONARY ANGIOPLASTY  95, 98, 2000, 2001  . [place]- 04/01/2016   "total of 4 stents counting today" (04/01/2016)  . CORONARY ARTERY BYPASS GRAFT  1989   triple bypass MUSC  . CYSTOSCOPY  08/07/2009   and right hydrocelectomy  . FOOT TENDON SURGERY Left 2012   "donor tendon"   . HEMI-MICRODISCECTOMY LUMBAR LAMINECTOMY LEVEL 1 Right 09/18/2013   Procedure: CENTRAL DECOMPRESSION @ L4-5 FOR SPINAL STENOSIS. EXCISION OF SYNOVIAL CYST @ L4-5 RIGHT, MICRODISCECTOMY @L4 -5 RIGHT ;  Surgeon: Tobi Bastos, MD;  [facility];  Service: Orthopedics;  Laterality: Right;  . INGUINAL HERNIA REPAIR Bilateral 1970's   "2 on one side; 1 on the other"   . LEFT HEART CATH AND CORS/GRAFTS ANGIOGRAPHY N/A 06/08/2017   Procedure: LEFT HEART CATH  AND CORS/GRAFTS ANGIOGRAPHY;  Surgeon: Lorretta Harp, MD;  Location: [place] [facility];  Service: Cardiovascular;  Laterality: N/A;  . LEFT HEART CATHETERIZATION WITH CORONARY ANGIOGRAM Bilateral 05/15/2013   Procedure: LEFT HEART CATHETERIZATION WITH CORONARY ANGIOGRAM;  Surgeon: Wellington Hampshire, MD;  Location: [place] [facility];  Service: Cardiovascular;  Laterality: Bilateral;  . SHOULDER OPEN ROTATOR CUFF REPAIR Right 2009   Devani Odonnel  . SURGERY SCROTAL / TESTICULAR Right 2012   "removed the sac" (05/15/2013)  . TONSILLECTOMY AND ADENOIDECTOMY  ~1961  . TRANSURETHRAL  RESECTION OF PROSTATE  10/02/2009   Gyrus transurethral resection   . UPPER GI ENDOSCOPY  09/06/2011    Family History  Problem Relation Age of Onset  . Coronary artery disease Father   . Coronary artery disease Mother        pacemaker  . Heart disease Brother   . Esophageal cancer Brother    Social History:  reports that he quit smoking about 24 years ago. His smoking use included cigarettes. He has a 45.00 pack-year smoking history. He has never used smokeless tobacco. He reports previous alcohol use. He reports that he does not use drugs.  Allergies:  Allergies  Allergen Reactions  . Metoprolol Other (See Comments)    REACTION: severe bradycardia  . Adhesive [Tape] Rash    Medications Prior to Admission  Medication Sig Dispense Refill  . amitriptyline (ELAVIL) 25 MG tablet Take 1 tablet (25 mg total) by mouth at bedtime. Reported on 01/25/2016 90 tablet 1  . amoxicillin (AMOXIL) 500 MG capsule Take 2,000 mg by mouth as directed. Take 2000 mg 1 hour prior to dental work  0  . atenolol (TENORMIN) 25 MG tablet TAKE 1 TABLET AT BEDTIME (Patient taking differently: Take 25 mg by mouth at bedtime. ) 90 tablet 1  . atorvastatin (LIPITOR) 40 MG tablet Take 1 tablet (40 mg total) by mouth daily. (Patient taking differently: Take 40 mg by mouth daily at 2 PM. ) 90 tablet 3  . clopidogrel (PLAVIX) 75 MG tablet TAKE 1 TABLET (75 MG TOTAL) BY MOUTH DAILY. (Patient taking differently: Take 75 mg by mouth daily. ) 90 tablet 0  . isosorbide mononitrate (IMDUR) 30 MG 24 hr tablet Take 1 tablet (30 mg total) by mouth 2 (two) times daily. 90 tablet 3  . lisinopril (PRINIVIL,ZESTRIL) 10 MG tablet TAKE 1 TABLET AT BEDTIME 90 tablet 1  . pantoprazole (PROTONIX) 40 MG tablet Take 1 tablet (40 mg total) by mouth daily. 14 tablet 0  . sulindac (CLINORIL) 200 MG tablet Take 200 mg by mouth 2 (two) times daily.     . temazepam (RESTORIL) 30 MG capsule Take 1 capsule (30 mg total) by mouth at bedtime as needed  for sleep. (Patient taking differently: Take 30 mg by mouth at bedtime. ) 30 capsule 5  . albuterol (PROAIR HFA) 108 (90 BASE) MCG/ACT inhaler Inhale 1-2 puffs into the lungs every 6 (six) hours as needed for wheezing or shortness of breath.     . nitroGLYCERIN (NITROSTAT) 0.4 MG SL tablet Place 1 tablet (0.4 mg total) under the tongue every 5 (five) minutes as needed for chest pain. 25 tablet 3    No results found for this or any previous visit (from the past 48 hour(s)). No results found.  Review of Systems  Constitutional: Negative.   HENT: Negative.   Eyes: Negative.   Respiratory: Negative.   Cardiovascular: Negative.   Gastrointestinal: Negative.   Genitourinary: Negative.   Musculoskeletal:  Positive for joint pain.  Skin: Negative.   Neurological: Negative.   Endo/Heme/Allergies: Negative.   Psychiatric/Behavioral: Negative.     Blood pressure 113/89, pulse 71, temperature 98.1 F (36.7 C), temperature source Oral, resp. rate 16, height 6' (1.829 m), weight 109.8 kg, SpO2 94 %. Physical Exam  Constitutional: He appears well-developed.  HENT:  Head: Normocephalic.  Eyes: Pupils are equal, round, and reactive to light.  Neck: Normal range of motion.  Cardiovascular: Normal rate.  Respiratory: Effort normal.  GI: Soft.  Musculoskeletal:        General: Tenderness present.     Comments: Painful range of motion of his Left Knee  Neurological: He is alert.  Skin: Skin is warm.  Psychiatric: He has a normal mood and affect.     Assessment/Plan Arthroscopic meniscectomy of his Left Knee.  Latanya Maudlin, MD 09/18/2018, 12:29 PM

## 2018-09-20 ENCOUNTER — Encounter (HOSPITAL_COMMUNITY): Payer: Self-pay | Admitting: Orthopedic Surgery

## 2018-09-20 ENCOUNTER — Telehealth: Payer: Self-pay

## 2018-09-20 NOTE — Anesthesia Postprocedure Evaluation (Signed)
Anesthesia Post Note  Patient: CLENTON ESPER  Procedure(s) Performed: DIAGNOSTIC KNEE ARTHROSCOPY WITH MEDIAL MENISECTOMY (Left Knee)     Patient location during evaluation: PACU Anesthesia Type: General Level of consciousness: awake and alert Pain management: pain level controlled Vital Signs Assessment: post-procedure vital signs reviewed and stable Respiratory status: spontaneous breathing, nonlabored ventilation and respiratory function stable Cardiovascular status: blood pressure returned to baseline and stable Postop Assessment: no apparent nausea or vomiting Anesthetic complications: no    Last Vitals:  Vitals:   09/18/18 1430 09/18/18 1440  BP: (!) 149/86 (!) 147/92  Pulse: 67 (!) 56  Resp: 12 14  Temp: 36.5 C 36.4 C  SpO2: 95% 94%    Last Pain:  Vitals:   09/18/18 1440  TempSrc:   PainSc: 4    Pain Goal:                 Audry Pili

## 2018-09-20 NOTE — Telephone Encounter (Signed)
Pt is on TCM list after left knee arthroplasty with menisectomy. Pt to follow up with ortho.

## 2018-09-21 ENCOUNTER — Encounter: Payer: Self-pay | Admitting: Internal Medicine

## 2018-09-21 ENCOUNTER — Ambulatory Visit (INDEPENDENT_AMBULATORY_CARE_PROVIDER_SITE_OTHER): Payer: Medicare HMO | Admitting: Internal Medicine

## 2018-09-21 VITALS — BP 146/88 | HR 71 | Temp 98.2°F | Ht 72.0 in | Wt 251.0 lb

## 2018-09-21 DIAGNOSIS — I10 Essential (primary) hypertension: Secondary | ICD-10-CM

## 2018-09-21 DIAGNOSIS — H9191 Unspecified hearing loss, right ear: Secondary | ICD-10-CM | POA: Insufficient documentation

## 2018-09-21 DIAGNOSIS — R7302 Impaired glucose tolerance (oral): Secondary | ICD-10-CM

## 2018-09-21 DIAGNOSIS — H905 Unspecified sensorineural hearing loss: Secondary | ICD-10-CM

## 2018-09-21 NOTE — Patient Instructions (Addendum)
Your right ear was irrigated of a wax impaction today, but unable to complete  You will be contacted regarding the referral for: ENT  Please continue all other medications as before, and refills have been done if requested.  Please have the pharmacy call with any other refills you may need.  Please continue your efforts at being more active, low cholesterol diet, and weight control.  Please keep your appointments with your specialists as you may have planned

## 2018-09-21 NOTE — Progress Notes (Signed)
Subjective:    Patient ID: Robert [name], male    DOB: 03/17/1946, 73 y.o.   MRN: [id number]  HPI  Here with c/o 1 wk right ear hearing loss; wears hearing aid and has been using the qtip but just cant seem to resolve the current wax impaction.  No fever, ST, cough or sinus congestion.  Pt denies chest pain, increased sob or doe, wheezing, orthopnea, PND, increased LE swelling, palpitations, dizziness or syncope.   Pt denies polydipsia, polyuria.   Pt denies fever, wt loss, night sweats, loss of appetite, or other constitutional symptoms Past Medical History:  Diagnosis Date  . ALLERGIC RHINITIS 08/20/2007  . Anemia   . Arthritis    "back; knees; shoulders"  (06/08/2017)  . Asthma    "very slight"   . Barrett esophagus   . BENIGN PROSTATIC HYPERTROPHY 08/20/2007  . Coronary artery disease involving native heart with angina pectoris ([place])    a. s/p CABG x 3 in 1989;  b. MI in 1995 with BMS to VG->RCA;  c. 07/2000 PCI/BMS to native LCX;  d. Known CTO of VG->Diag;  e. Mult caths w/ stable anatomy;  f. 04/2013 Cath: LM nl, ostLAD 100, LCX 10%isr, 69m, OM1/2/3 min irregs, RI min irregs, pRCA 80-90%/ 100& mid, VG->RCA ostial Stent w/ 20-30isr, LIMA->LAD ok, Occluded VG-Diag. EF 60%-->Med Rx.  . ELBOW PAIN, LEFT 04/15/2008  . ERECTILE DYSFUNCTION 08/20/2007  . Gallbladder polyp 08/27/2015   Noted on [place] Abd  . GERD (gastroesophageal reflux disease)   . Hepatic cyst 09/06/2015   Large simple right hepatic lobe, noted on [place] Abd  . History of cardiomegaly 05/15/2013   Noted on CXR  . History of meningitis    2nd grade, 8th grade, 9th grade, age 75  . HYPERLIPIDEMIA TYPE I / IV 09/03/2008  . HYPERTENSION    "been on RX since CABG but never a problem" (06/08/2017)  . INSOMNIA-SLEEP DISORDER-UNSPEC 03/17/2008  . Microcytosis   . Myocardial infarction [facility]) June 1995  . OSA on CPAP   . PREMATURE VENTRICULAR CONTRACTIONS 08/20/2007  . SCROTAL MASS 10/09/2008  . URINARY INCONTINENCE, MALE 10/09/2008    . UTI 04/24/2009   "just this once" (05/15/2013)   Past Surgical History:  Procedure Laterality Date  . ANKLE FRACTURE SURGERY Right 1984  . BACK SURGERY    . CARDIAC CATHETERIZATION  X 2  . CARDIAC CATHETERIZATION N/A 04/01/2016   Procedure: Left Heart Cath and Cors/Grafts Angiography;  Surgeon: Leonie Man, MD;  Location: [place] [facility];  Service: Cardiovascular;  Laterality: N/A;  . CARDIAC CATHETERIZATION N/A 04/01/2016   Procedure: Intravascular Pressure Wire/FFR Study;  Surgeon: Leonie Man, MD;  Location: [place] [facility];  Service: Cardiovascular;  Laterality: N/A;  . CARDIAC CATHETERIZATION N/A 04/01/2016   Procedure: Coronary Stent Intervention;  Surgeon: Leonie Man, MD;  Location: [place] [facility];  Service: Cardiovascular;  Laterality: N/A;  . CARDIAC CATHETERIZATION  06/08/2017  . COLONOSCOPY  08/31/2011  . COLONOSCOPY W/ BIOPSIES AND POLYPECTOMY  11/20/2003   adenomatous polyp, internal and external hemorrhoids  . CORONARY ANGIOPLASTY  95, 98, 2000, 2001  . [place]- 04/01/2016   "total of 4 stents counting today" (04/01/2016)  . CORONARY ARTERY BYPASS GRAFT  1989   triple bypass MUSC  . CYSTOSCOPY  08/07/2009   and right hydrocelectomy  . FOOT TENDON SURGERY Left 2012   "donor tendon"   . HEMI-MICRODISCECTOMY LUMBAR LAMINECTOMY LEVEL 1  Right 09/18/2013   Procedure: CENTRAL DECOMPRESSION @ L4-5 FOR SPINAL STENOSIS. EXCISION OF SYNOVIAL CYST @ L4-5 RIGHT, MICRODISCECTOMY @L4 -5 RIGHT ;  Surgeon: Tobi Bastos, MD;  [facility];  Service: Orthopedics;  Laterality: Right;  . INGUINAL HERNIA REPAIR Bilateral 1970's   "2 on one side; 1 on the other"   . KNEE ARTHROSCOPY WITH MEDIAL MENISECTOMY Left 09/18/2018   Procedure: DIAGNOSTIC KNEE ARTHROSCOPY WITH MEDIAL MENISECTOMY;  Surgeon: Latanya Maudlin, MD;  [facility];  Service: Orthopedics;  Laterality: Left;  73min  . LEFT HEART CATH AND CORS/GRAFTS ANGIOGRAPHY  N/A 06/08/2017   Procedure: LEFT HEART CATH AND CORS/GRAFTS ANGIOGRAPHY;  Surgeon: Lorretta Harp, MD;  Location: [place] [facility];  Service: Cardiovascular;  Laterality: N/A;  . LEFT HEART CATHETERIZATION WITH CORONARY ANGIOGRAM Bilateral 05/15/2013   Procedure: LEFT HEART CATHETERIZATION WITH CORONARY ANGIOGRAM;  Surgeon: Wellington Hampshire, MD;  Location: [place] [facility];  Service: Cardiovascular;  Laterality: Bilateral;  . SHOULDER OPEN ROTATOR CUFF REPAIR Right 2009   Gioffre  . SURGERY SCROTAL / TESTICULAR Right 2012   "removed the sac" (05/15/2013)  . TONSILLECTOMY AND ADENOIDECTOMY  ~1961  . TRANSURETHRAL RESECTION OF PROSTATE  10/02/2009   Gyrus transurethral resection   . UPPER GI ENDOSCOPY  09/06/2011    reports that he quit smoking about 24 years ago. His smoking use included cigarettes. He has a 45.00 pack-year smoking history. He has never used smokeless tobacco. He reports previous alcohol use. He reports that he does not use drugs. family history includes Coronary artery disease in his father and mother; Esophageal cancer in his brother; Heart disease in his brother. Allergies  Allergen Reactions  . Metoprolol Other (See Comments)    REACTION: severe bradycardia  . Adhesive [Tape] Rash   Current Outpatient Medications on File Prior to Visit  Medication Sig Dispense Refill  . albuterol (PROAIR HFA) 108 (90 BASE) MCG/ACT inhaler Inhale 1-2 puffs into the lungs every 6 (six) hours as needed for wheezing or shortness of breath.     Marland Kitchen amitriptyline (ELAVIL) 25 MG tablet Take 1 tablet (25 mg total) by mouth at bedtime. Reported on 01/25/2016 90 tablet 1  . amoxicillin (AMOXIL) 500 MG capsule Take 2,000 mg by mouth as directed. Take 2000 mg 1 hour prior to dental work  0  . atenolol (TENORMIN) 25 MG tablet TAKE 1 TABLET AT BEDTIME (Patient taking differently: Take 25 mg by mouth at bedtime. ) 90 tablet 1  . atorvastatin (LIPITOR) 40 MG tablet Take 1 tablet (40 mg total) by mouth daily.  (Patient taking differently: Take 40 mg by mouth daily at 2 PM. ) 90 tablet 3  . clopidogrel (PLAVIX) 75 MG tablet TAKE 1 TABLET (75 MG TOTAL) BY MOUTH DAILY. (Patient taking differently: Take 75 mg by mouth daily. ) 90 tablet 0  . isosorbide mononitrate (IMDUR) 30 MG 24 hr tablet Take 1 tablet (30 mg total) by mouth 2 (two) times daily. 90 tablet 3  . lisinopril (PRINIVIL,ZESTRIL) 10 MG tablet TAKE 1 TABLET AT BEDTIME 90 tablet 1  . nitroGLYCERIN (NITROSTAT) 0.4 MG SL tablet Place 1 tablet (0.4 mg total) under the tongue every 5 (five) minutes as needed for chest pain. 25 tablet 3  . pantoprazole (PROTONIX) 40 MG tablet Take 1 tablet (40 mg total) by mouth daily. 14 tablet 0  . sulindac (CLINORIL) 200 MG tablet Take 200 mg by mouth 2 (two) times daily.     . temazepam (RESTORIL) 30 MG  capsule Take 1 capsule (30 mg total) by mouth at bedtime as needed for sleep. (Patient taking differently: Take 30 mg by mouth at bedtime. ) 30 capsule 5   No current facility-administered medications on file prior to visit.    Review of Systems  Constitutional: Negative for other unusual diaphoresis or sweats HENT: Negative for ear discharge or swelling Eyes: Negative for other worsening visual disturbances Respiratory: Negative for stridor or other swelling  Gastrointestinal: Negative for worsening distension or other blood Genitourinary: Negative for retention or other urinary change Musculoskeletal: Negative for other MSK pain or swelling Skin: Negative for color change or other new lesions Neurological: Negative for worsening tremors and other numbness  Psychiatric/Behavioral: Negative for worsening agitation or other fatigue All other system neg per pt    Objective:   Physical Exam BP (!) 146/88   Pulse 71   Temp 98.2 F (36.8 C) (Oral)   Ht 6' (1.829 m)   Wt 251 lb (113.9 kg)   SpO2 95%   BMI 34.04 kg/m  VS noted, not ill appearing Constitutional: Pt appears in NAD HENT: Head: NCAT.  Right  Ear: External ear normal. Right canal with distal canal impaction, unable to irrigate free Left Ear: External ear normal.  Eyes: . Pupils are equal, round, and reactive to light. Conjunctivae and EOM are normal Nose: without d/c or deformity Neck: Neck supple. Gross normal ROM Cardiovascular: Normal rate and regular rhythm.   Pulmonary/Chest: Effort normal and breath sounds without rales or wheezing.  Neurological: Pt is alert. At baseline orientation, motor grossly intact Skin: Skin is warm. No rashes, other new lesions, no LE edema Psychiatric: Pt behavior is normal without agitation  No other exam findings  Lab Results  Component Value Date   WBC 8.1 09/07/2018   HGB 15.1 09/07/2018   HCT 49.3 09/07/2018   PLT 216 09/07/2018   GLUCOSE 97 09/07/2018   CHOL 119 08/29/2018   TRIG 72 08/29/2018   HDL 38 (L) 08/29/2018   LDLCALC 67 08/29/2018   ALT 24 09/07/2018   AST 26 09/07/2018   NA 140 09/07/2018   K 4.0 09/07/2018   CL 104 09/07/2018   CREATININE 0.84 09/07/2018   BUN 18 09/07/2018   CO2 24 09/07/2018   TSH 0.414 05/15/2013   PSA 0.93 09/29/2015   INR 1.05 09/07/2018   HGBA1C 5.8 (H) 05/15/2013         Assessment & Plan:

## 2018-09-21 NOTE — Assessment & Plan Note (Signed)
stable overall by history and exam, recent data reviewed with pt, and pt to continue medical treatment as before,  to f/u any worsening symptoms or concerns  

## 2018-09-21 NOTE — Assessment & Plan Note (Signed)
With impaction unable to be resolved today, for ENT referral, avoid further qtip use

## 2018-09-25 ENCOUNTER — Telehealth: Payer: Self-pay

## 2018-09-25 NOTE — Telephone Encounter (Signed)
Copied from [place] [phone]. Topic: General - Other >> Sep 25, 2018 11:09 AM Carolyn Stare wrote:  Autum with Dr Ernesto Rutherford office ENT call to say pt made an appt and they need last office notes and demographic   Fax number  [phone]

## 2018-09-26 DIAGNOSIS — H6061 Unspecified chronic otitis externa, right ear: Secondary | ICD-10-CM | POA: Diagnosis not present

## 2018-09-26 DIAGNOSIS — H6121 Impacted cerumen, right ear: Secondary | ICD-10-CM | POA: Diagnosis not present

## 2018-09-26 NOTE — Telephone Encounter (Signed)
Notes sent to Dr. Berle Mull office

## 2018-11-13 DIAGNOSIS — Z471 Aftercare following joint replacement surgery: Secondary | ICD-10-CM | POA: Insufficient documentation

## 2018-11-22 ENCOUNTER — Encounter: Payer: Self-pay | Admitting: Internal Medicine

## 2018-12-31 ENCOUNTER — Other Ambulatory Visit: Payer: Self-pay | Admitting: Physician Assistant

## 2018-12-31 ENCOUNTER — Other Ambulatory Visit: Payer: Self-pay | Admitting: Internal Medicine

## 2019-01-03 DIAGNOSIS — M79672 Pain in left foot: Secondary | ICD-10-CM | POA: Diagnosis not present

## 2019-01-03 DIAGNOSIS — M25562 Pain in left knee: Secondary | ICD-10-CM | POA: Diagnosis not present

## 2019-01-03 DIAGNOSIS — M170 Bilateral primary osteoarthritis of knee: Secondary | ICD-10-CM | POA: Diagnosis not present

## 2019-01-03 DIAGNOSIS — M1712 Unilateral primary osteoarthritis, left knee: Secondary | ICD-10-CM | POA: Diagnosis not present

## 2019-01-03 DIAGNOSIS — M1711 Unilateral primary osteoarthritis, right knee: Secondary | ICD-10-CM | POA: Diagnosis not present

## 2019-01-09 DIAGNOSIS — M25572 Pain in left ankle and joints of left foot: Secondary | ICD-10-CM | POA: Diagnosis not present

## 2019-01-09 DIAGNOSIS — M216X2 Other acquired deformities of left foot: Secondary | ICD-10-CM | POA: Diagnosis not present

## 2019-01-09 DIAGNOSIS — M67962 Unspecified disorder of synovium and tendon, left lower leg: Secondary | ICD-10-CM | POA: Diagnosis not present

## 2019-01-09 DIAGNOSIS — M79672 Pain in left foot: Secondary | ICD-10-CM | POA: Diagnosis not present

## 2019-01-21 DIAGNOSIS — M79672 Pain in left foot: Secondary | ICD-10-CM | POA: Diagnosis not present

## 2019-01-23 DIAGNOSIS — M7672 Peroneal tendinitis, left leg: Secondary | ICD-10-CM | POA: Diagnosis not present

## 2019-01-23 DIAGNOSIS — M216X2 Other acquired deformities of left foot: Secondary | ICD-10-CM | POA: Diagnosis not present

## 2019-02-14 DIAGNOSIS — M1712 Unilateral primary osteoarthritis, left knee: Secondary | ICD-10-CM | POA: Diagnosis not present

## 2019-02-14 DIAGNOSIS — M25562 Pain in left knee: Secondary | ICD-10-CM | POA: Diagnosis not present

## 2019-02-21 DIAGNOSIS — M25562 Pain in left knee: Secondary | ICD-10-CM | POA: Diagnosis not present

## 2019-02-21 DIAGNOSIS — M1712 Unilateral primary osteoarthritis, left knee: Secondary | ICD-10-CM | POA: Diagnosis not present

## 2019-03-01 DIAGNOSIS — M25562 Pain in left knee: Secondary | ICD-10-CM | POA: Diagnosis not present

## 2019-03-01 DIAGNOSIS — M1712 Unilateral primary osteoarthritis, left knee: Secondary | ICD-10-CM | POA: Diagnosis not present

## 2019-03-30 ENCOUNTER — Other Ambulatory Visit: Payer: Self-pay | Admitting: *Deleted

## 2019-03-30 DIAGNOSIS — Z20822 Contact with and (suspected) exposure to covid-19: Secondary | ICD-10-CM

## 2019-04-05 LAB — NOVEL CORONAVIRUS, NAA: SARS-CoV-2, NAA: NOT DETECTED

## 2019-04-11 DIAGNOSIS — M1712 Unilateral primary osteoarthritis, left knee: Secondary | ICD-10-CM | POA: Diagnosis not present

## 2019-04-25 DIAGNOSIS — M7672 Peroneal tendinitis, left leg: Secondary | ICD-10-CM | POA: Diagnosis not present

## 2019-04-30 DIAGNOSIS — M7672 Peroneal tendinitis, left leg: Secondary | ICD-10-CM | POA: Diagnosis not present

## 2019-05-02 DIAGNOSIS — M7672 Peroneal tendinitis, left leg: Secondary | ICD-10-CM | POA: Diagnosis not present

## 2019-05-07 DIAGNOSIS — M7672 Peroneal tendinitis, left leg: Secondary | ICD-10-CM | POA: Diagnosis not present

## 2019-05-23 DIAGNOSIS — M1712 Unilateral primary osteoarthritis, left knee: Secondary | ICD-10-CM | POA: Diagnosis not present

## 2019-05-24 ENCOUNTER — Telehealth: Payer: Self-pay | Admitting: *Deleted

## 2019-05-24 NOTE — Telephone Encounter (Signed)
   [facility] Pre-operative Risk Assessment    Request for surgical clearance:  1. What type of surgery is being performed? R TOTAL KNEE ARTHOPLASTY  2. When is this surgery scheduled? 07/01/19  3. What type of clearance is required (medical clearance vs. Pharmacy clearance to hold med vs. Both)? BOTH  4. Are there any medications that need to be held prior to surgery and how long? PLAVIX  5. Practice name and name of physician performing surgery? [facility]  6. What is your office phone number [phone]   7.   What is your office fax number [phone]  8.   Anesthesia type (None, local, MAC, general) ? CHOICE   Devra Dopp 05/24/2019, 3:41 PM  _________________________________________________________________   (provider comments below)

## 2019-05-28 NOTE — Telephone Encounter (Signed)
   Primary [place], MD  Chart reviewed as part of pre-operative protocol coverage. Because of Robert [name]'s past medical history and time since last visit, he/she will require a follow-up visit in order to better assess preoperative cardiovascular risk.  Pre-op covering staff: - Please schedule appointment and call patient to inform them. - Please contact requesting surgeon's office via preferred method (i.e, phone, fax) to inform them of need for appointment prior to surgery.  If applicable, this message will also be routed to pharmacy pool and/or primary cardiologist for input on holding anticoagulant/antiplatelet agent as requested below so that this information is available at time of patient's appointment.   Ledora Bottcher, PA  05/28/2019, 2:37 PM

## 2019-05-28 NOTE — Telephone Encounter (Signed)
Left a detailed message for the patient informing him that he needs to get scheduled for an appointment for clearance for his upcoming procedure on 07/01/2019 and to give our office a call and someone will be glad to assist him with getting scheduled

## 2019-05-29 NOTE — Telephone Encounter (Signed)
Tried calling patient to get him scheduled for an appointment for pre-op clearance. Will route to the scheduling pool

## 2019-05-30 NOTE — Progress Notes (Addendum)
Virtual Visit via Telephone Note   This visit type was conducted due to national recommendations for restrictions regarding the COVID-19 Pandemic (e.g. social distancing) in an effort to limit this patient's exposure and mitigate transmission in our community.  Due to his co-morbid illnesses, this patient is at least at moderate risk for complications without adequate follow up.  This format is felt to be most appropriate for this patient at this time.  The patient did not have access to video technology/had technical difficulties with video requiring transitioning to audio format only (telephone).  All issues noted in this document were discussed and addressed.  No physical exam could be performed with this format.  The patient did verbally consent to telehealth for [facility].   Date:  05/31/2019   ID:  [name], DOB 03-Sep-1946, MRN [id number]  Patient Location: Home Provider Location: Home  PCP:  Biagio Borg, MD  Cardiologist:  Sherren Mocha, MD   Electrophysiologist:  None   Evaluation Performed:  Follow-Up Visit  Chief Complaint:  CAD, surgical clearance   History of Present Illness:    DONOVANN ORRICK is a 73 y.o. male with:   Coronary artery disease   S/p CABG in 1989  S/p multiple PCIs since CABG  Cath 9/18: OM2 stent patent, S-RCA stent patent, L-LAD patent, S-Dx 54 (old), EF 50-55  Hypertension   Hyperlipidemia   PVCs  GERD  Mr. Winker was last seen in October 2019 by Melina Copa, PA-C.  Today, he is seen for annual follow-up.  He also needs surgical clearance for upcoming right total knee replacement with Dr. Wynelle Link on 07/01/2019.  Overall, he is doing well without chest discomfort, shortness of breath, syncope, orthopnea, lower extremity swelling.  He uses a CPAP at night.  He is able to continue to remain active despite his knee pain.  He continues to play golf and continue work around his house without difficulty.  The patient does not  have symptoms concerning for COVID-19 infection (fever, chills, cough, or new shortness of breath).    Past Medical History:  Diagnosis Date   ALLERGIC RHINITIS 08/20/2007   Anemia    Arthritis    "back; knees; shoulders"  (06/08/2017)   Asthma    "very slight"    Barrett esophagus    BENIGN PROSTATIC HYPERTROPHY 08/20/2007   Coronary artery disease involving native heart with angina pectoris ([place])    a. s/p CABG x 3 in 1989;  b. MI in 1995 with BMS to VG->RCA;  c. 07/2000 PCI/BMS to native LCX;  d. Known CTO of VG->Diag;  e. Mult caths w/ stable anatomy;  f. 04/2013 Cath: LM nl, ostLAD 100, LCX 10%isr, 110m, OM1/2/3 min irregs, RI min irregs, pRCA 80-90%/ 100& mid, VG->RCA ostial Stent w/ 20-30isr, LIMA->LAD ok, Occluded VG-Diag. EF 60%-->Med Rx.   ELBOW PAIN, LEFT 04/15/2008   ERECTILE DYSFUNCTION 08/20/2007   Gallbladder polyp 08/27/2015   Noted on [place] Abd   GERD (gastroesophageal reflux disease)    Hepatic cyst 09/06/2015   Large simple right hepatic lobe, noted on [place] Abd   History of cardiomegaly 05/15/2013   Noted on CXR   History of meningitis    2nd grade, 8th grade, 9th grade, age 74   HYPERLIPIDEMIA TYPE I / IV 09/03/2008   HYPERTENSION    "been on RX since CABG but never a problem" (06/08/2017)   INSOMNIA-SLEEP DISORDER-UNSPEC 03/17/2008   Microcytosis    Myocardial infarction [facility]) June 1995  OSA on CPAP    PREMATURE VENTRICULAR CONTRACTIONS 08/20/2007   SCROTAL MASS 10/09/2008   URINARY INCONTINENCE, MALE 10/09/2008   UTI 04/24/2009   "just this once" (05/15/2013)   Past Surgical History:  Procedure Laterality Date   ANKLE FRACTURE SURGERY Right [place]  X 2   CARDIAC CATHETERIZATION N/A 04/01/2016   Procedure: Left Heart Cath and Cors/Grafts Angiography;  Surgeon: Leonie Man, MD;  Location: [place] [facility];  Service: Cardiovascular;  Laterality: N/A;   CARDIAC CATHETERIZATION N/A 04/01/2016    Procedure: Intravascular Pressure Wire/FFR Study;  Surgeon: Leonie Man, MD;  Location: [place] [facility];  Service: Cardiovascular;  Laterality: N/A;   CARDIAC CATHETERIZATION N/A 04/01/2016   Procedure: Coronary Stent Intervention;  Surgeon: Leonie Man, MD;  Location: [place] [facility];  Service: Cardiovascular;  Laterality: N/A;   CARDIAC CATHETERIZATION  06/08/2017   COLONOSCOPY  08/31/2011   COLONOSCOPY W/ BIOPSIES AND POLYPECTOMY  11/20/2003   adenomatous polyp, internal and external hemorrhoids   CORONARY ANGIOPLASTY  95, 98, 2000, [place]- 04/01/2016   "total of 4 stents counting today" (04/01/2016)   CORONARY ARTERY BYPASS GRAFT  1989   triple bypass [facility]C   CYSTOSCOPY  08/07/2009   and right hydrocelectomy   FOOT TENDON SURGERY Left 2012   "donor tendon"    HEMI-MICRODISCECTOMY LUMBAR LAMINECTOMY LEVEL 1 Right 09/18/2013   Procedure: CENTRAL DECOMPRESSION @ L4-5 FOR SPINAL STENOSIS. EXCISION OF SYNOVIAL CYST @ L4-5 RIGHT, MICRODISCECTOMY @L4 -5 RIGHT ;  Surgeon: Tobi Bastos, MD;  Location: [facility];  Service: Orthopedics;  Laterality: Right;   INGUINAL HERNIA REPAIR Bilateral 1970's   "2 on one side; 1 on the other"    KNEE ARTHROSCOPY WITH MEDIAL MENISECTOMY Left 09/18/2018   Procedure: DIAGNOSTIC KNEE ARTHROSCOPY WITH MEDIAL MENISECTOMY;  Surgeon: Latanya Maudlin, MD;  Location: [facility];  Service: Orthopedics;  Laterality: Left;  63min   LEFT HEART CATH AND CORS/GRAFTS ANGIOGRAPHY N/A 06/08/2017   Procedure: LEFT HEART CATH AND CORS/GRAFTS ANGIOGRAPHY;  Surgeon: Lorretta Harp, MD;  Location: [place] [facility];  Service: Cardiovascular;  Laterality: N/A;   LEFT HEART CATHETERIZATION WITH CORONARY ANGIOGRAM Bilateral 05/15/2013   Procedure: LEFT HEART CATHETERIZATION WITH CORONARY ANGIOGRAM;  Surgeon: Wellington Hampshire, MD;  Location: [place] [facility];  Service: Cardiovascular;  Laterality: Bilateral;   SHOULDER OPEN  ROTATOR CUFF REPAIR Right 2009   [place]   SURGERY SCROTAL / TESTICULAR Right 2012   "removed the sac" (05/15/2013)   TONSILLECTOMY AND ADENOIDECTOMY  ~1961   TRANSURETHRAL RESECTION OF PROSTATE  10/02/2009   [place] transurethral resection    UPPER GI ENDOSCOPY  09/06/2011     Current Meds  Medication Sig   albuterol (PROAIR HFA) 108 (90 BASE) MCG/ACT inhaler Inhale 1-2 puffs into the lungs every 6 (six) hours as needed for wheezing or shortness of breath.    amitriptyline (ELAVIL) 25 MG tablet Take 1 tablet (25 mg total) by mouth at bedtime. Reported on 01/25/2016   amoxicillin (AMOXIL) 500 MG capsule Take 2,000 mg by mouth as directed. Take 2000 mg 1 hour prior to dental work   atenolol (TENORMIN) 25 MG tablet TAKE 1 TABLET AT BEDTIME   atorvastatin (LIPITOR) 40 MG tablet Take 1 tablet (40 mg total) by mouth daily. (Patient taking differently: Take 40 mg by mouth daily at 2 PM. )   clopidogrel (PLAVIX) 75  MG tablet TAKE 1 TABLET (75 MG TOTAL) BY MOUTH DAILY. (Patient taking differently: Take 75 mg by mouth daily. )   isosorbide mononitrate (IMDUR) 30 MG 24 hr tablet TAKE 1 TABLET TWICE DAILY   lisinopril (PRINIVIL,ZESTRIL) 10 MG tablet TAKE 1 TABLET AT BEDTIME   nitroGLYCERIN (NITROSTAT) 0.4 MG SL tablet Place 1 tablet (0.4 mg total) under the tongue every 5 (five) minutes as needed for chest pain.   omeprazole (PRILOSEC) 20 MG capsule TAKE 1 CAPSULE DAILY (NEED PHYSICAL FOR ADDITIONAL REFILLS)   pantoprazole (PROTONIX) 40 MG tablet Take 1 tablet (40 mg total) by mouth daily.   sulindac (CLINORIL) 200 MG tablet Take 200 mg by mouth 2 (two) times daily.    temazepam (RESTORIL) 30 MG capsule Take 1 capsule (30 mg total) by mouth at bedtime as needed for sleep. (Patient taking differently: Take 30 mg by mouth at bedtime. )     Allergies:   Metoprolol and Adhesive [tape]   Social History   Tobacco Use   Smoking status: Former Smoker    Packs/day: 1.00    Years: 45.00     Pack years: 45.00    Types: Cigarettes    Quit date: 04/23/1994    Years since quitting: 25.1   Smokeless tobacco: Never Used  Substance Use Topics   Alcohol use: Not Currently    Comment:  "I've  quit drinking in 1989"   Drug use: No     Family Hx: The patient's family history includes Coronary artery disease in his father and mother; Esophageal cancer in his brother; Heart disease in his brother.  ROS:   Please see the history of present illness.    He has not had fever, cough, melena, hematochezia, hematuria. All other systems reviewed and are negative.   Prior CV studies:   The following studies were reviewed today:  Cardiac catheterization 06/08/17 LAD ostial 100, distal 30 LCx okay, OM 2 stent patent then 40 RCA proximal 80, mid 100 SVG-distal RCA stent patent with 50 ISR  LIMA-LAD patent SVG-diagonal known to be occluded EF 50-55   Labs/Other Tests and Data Reviewed:    EKG:  No ECG reviewed.  Recent Labs: 09/07/2018: ALT 24; BUN 18; Creatinine, Ser 0.84; Hemoglobin 15.1; Platelets 216; Potassium 4.0; Sodium 140   Recent Lipid Panel Lab Results  Component Value Date/Time   CHOL 119 08/29/2018 02:16 PM   TRIG 72 08/29/2018 02:16 PM   HDL 38 (L) 08/29/2018 02:16 PM   CHOLHDL 3.1 08/29/2018 02:16 PM   CHOLHDL 3.8 06/09/2017 02:15 AM   LDLCALC 67 08/29/2018 02:16 PM    Wt Readings from Last 3 Encounters:  05/31/19 240 lb (108.9 kg)  09/21/18 251 lb (113.9 kg)  09/18/18 242 lb (109.8 kg)     Objective:    Vital Signs:  BP 120/72    Pulse 61    Ht 6' (1.829 m)    Wt 240 lb (108.9 kg)    BMI 32.55 kg/m    VITAL SIGNS:  reviewed GEN:  no acute distress RESPIRATORY:  No labored breathing NEURO:  Alert and oriented PSYCH:  Normal mood  ASSESSMENT & PLAN:    1. Coronary artery disease involving native coronary artery of native heart with angina pectoris ([place]) History of CABG in 1989 and multiple subsequent PCI procedures.  He had a myocardial  infarction in the mid 1990s.  His most recent cardiac catheterization in 2018 demonstrated patent stent in the OM2, patent stent in the SVG-RCA  and patent LIMA-LAD.  SVG-DX was known to be occluded.  He is doing well without anginal symptoms.  Continue clopidogrel, atorvastatin, isosorbide, atenolol.  2. Essential hypertension The patient's blood pressure is controlled on his current regimen.  Continue current therapy.   3. Hyperlipidemia, unspecified hyperlipidemia type LDL optimal on most recent lab work.  Continue current Rx.    4. Preoperative cardiovascular examination The Revised Cardiac Risk Index indicates that his Perioperative Risk of Major Cardiac Event is (%): 0.9.  Therefore, he is at low risk for perioperative complications.  His Functional Capacity in METs is good at 5.07 as indicated by the Duke Activity Status Index (DASI).  According to ACC/AHA guidelines, no further cardiovascular testing needed.  The patient may proceed to surgery at acceptable risk.   If the bleeding risk is too great, he may hold Plavix for 5 days prior to his surgery and resume it postoperatively as soon as it is felt to be safe.  Time:   Today, I have spent 10 minutes with the patient with telehealth technology discussing the above problems.     Medication Adjustments/Labs and Tests Ordered: Current medicines are reviewed at length with the patient today.  Concerns regarding medicines are outlined above.   Tests Ordered: No orders of the defined types were placed in this encounter.   Medication Changes: No orders of the defined types were placed in this encounter.   Follow Up:  In Person in 1 year(s)  Signed, Richardson Dopp, PA-C  05/31/2019 10:13 AM    [place]

## 2019-05-31 ENCOUNTER — Other Ambulatory Visit: Payer: Self-pay

## 2019-05-31 ENCOUNTER — Encounter: Payer: Self-pay | Admitting: Physician Assistant

## 2019-05-31 ENCOUNTER — Telehealth (INDEPENDENT_AMBULATORY_CARE_PROVIDER_SITE_OTHER): Payer: Medicare HMO | Admitting: Physician Assistant

## 2019-05-31 VITALS — BP 120/72 | HR 61 | Ht 72.0 in | Wt 240.0 lb

## 2019-05-31 DIAGNOSIS — E785 Hyperlipidemia, unspecified: Secondary | ICD-10-CM

## 2019-05-31 DIAGNOSIS — I25119 Atherosclerotic heart disease of native coronary artery with unspecified angina pectoris: Secondary | ICD-10-CM | POA: Diagnosis not present

## 2019-05-31 DIAGNOSIS — Z01810 Encounter for preprocedural cardiovascular examination: Secondary | ICD-10-CM

## 2019-05-31 DIAGNOSIS — I10 Essential (primary) hypertension: Secondary | ICD-10-CM

## 2019-05-31 NOTE — Patient Instructions (Signed)
Medication Instructions:  Your physician recommends that you continue on your current medications as directed. Please refer to the Current Medication list given to you today.  *DO NOT use Prilosec as it can interfere with Plavix.   *If the surgeon requires it, you can hold Plavix for 5 days prior to your upcoming knee surgery. You should resume postop as soon as it is safe. The surgeon will call you when to resume it.   If you need a refill on your cardiac medications before your next appointment, please call your pharmacy.   Lab work: NONE If you have labs (blood work) drawn today and your tests are completely normal, you will receive your results only by: Marland Kitchen MyChart Message (if you have MyChart) OR . A paper copy in the mail If you have any lab test that is abnormal or we need to change your treatment, we will call you to review the results.  Testing/Procedures: NONE  Follow-Up: At [facility], you and your health needs are our priority.  As part of our continuing mission to provide you with exceptional heart care, we have created designated Provider Care Teams.  These Care Teams include your primary Cardiologist (physician) and Advanced Practice Providers (APPs -  Physician Assistants and Nurse Practitioners) who all work together to provide you with the care you need, when you need it. You will need a follow up appointment in:  12 months.  Please call our office 2 months in advance to schedule this appointment.  You may see Sherren Mocha, MD or Richardson Dopp, PA-C   Any Other Special Instructions Will Be Listed Below (If Applicable).

## 2019-06-20 NOTE — Patient Instructions (Addendum)
DUE TO COVID-19 ONLY ONE VISITOR IS ALLOWED TO COME WITH YOU AND STAY IN THE WAITING ROOM ONLY DURING PRE OP AND PROCEDURE DAY OF SURGERY. THE 1 VISITOR MAY VISIT WITH YOU AFTER SURGERY IN YOUR PRIVATE ROOM DURING VISITING HOURS ONLY!  YOU NEED TO HAVE A COVID 19 TEST ON_10/8______ @_9 :30______, THIS TEST MUST BE DONE BEFORE SURGERY, COME  [address], [place] , [address].  ([name]) ONCE YOUR COVID TEST IS COMPLETED, PLEASE BEGIN THE QUARANTINE INSTRUCTIONS AS OUTLINED IN YOUR HANDOUT.                Robert [name]   Your procedure is scheduled on: 10/12   Report to [facility] Main  Entrance   Report to admitting at 9:00 AM     Call this number if you have problems the morning of surgery [phone]   . BRUSH YOUR TEETH MORNING OF SURGERY AND RINSE YOUR MOUTH OUT, NO CHEWING GUM CANDY OR MINTS . Do not eat food After Midnight    CLEAR LIQUID DIET   Foods Allowed                                                                     Foods Excluded  Coffee and tea, regular and decaf                             liquids that you cannot  Plain Jell-O any favor except red or purple                                           see through such as: Fruit ices (not with fruit pulp)                                     milk, soups, orange juice  Iced Popsicles                                    All solid food Carbonated beverages, regular and diet                                    Cranberry, grape and apple juices Sports drinks like Gatorade Lightly seasoned clear broth or consume(fat free) Sugar, honey syrup Break _____________________________________________________________________   . YOU MAY HAVE CLEAR LIQUIDS FROM MIDNIGHT UNTIL8:30AM.   At 8:30AM Please finish the prescribed Pre-Surgery  drink.   Nothing by mouth after you finish the  drink !    Take these medicines the morning of surgery with A SIP OF WATER: Prilosec.   Bring your  inhaler and you tube and mask to the hospital with you.                                 You  may not have any metal on your body including piercings              Do not wear jewelry, make-up, lotions, powders or perfumes, deodorant              Men may shave face and neck.   Do not bring valuables to the hospital. [place].  Contacts, dentures or bridgework may not be worn into surgery.      Name and phone number of your driver:  Special Instructions: N/A              Please read over the following fact sheets you were given: _____________________________________________________________________             [facility] - Preparing for Surgery  Before surgery, you can play an important role.   Because skin is not sterile, your skin needs to be as free of germs as possible.   You can reduce the number of germs on your skin by washing with CHG (chlorahexidine gluconate) soap before surgery.   CHG is an antiseptic cleaner which kills germs and bonds with the skin to continue killing germs even after washing. Please DO NOT use if you have an allergy to CHG or antibacterial soaps. If your skin becomes reddened/irritated stop using the CHG and inform your nurse when you arrive at Short Stay. .  You may shave your face/neck.  Please follow these instructions carefully:  1.  Shower with CHG Soap the night before surgery and the  morning of Surgery.  2.  If you choose to wash your hair, wash your hair first as usual with your  normal  shampoo.  3.  After you shampoo, rinse your hair and body thoroughly to remove the  shampoo.                                        4.  Use CHG as you would any other liquid soap.  You can apply chg directly  to the skin and wash                       Gently with a scrungie or clean washcloth.  5.  Apply the CHG Soap to your body ONLY FROM THE NECK DOWN.   Do not use on face/ open                            Wound or open sores. Avoid contact with eyes, ears mouth and genitals (private parts).                       Wash face,  Genitals (private parts) with your normal soap.             6.  Wash thoroughly, paying special attention to the area where your surgery  will be performed.  7.  Thoroughly rinse your body with warm water from the neck down.  8.  DO NOT shower/wash with your normal soap after using and rinsing off  the CHG Soap.                9.  Pat yourself dry with a clean towel.  10.  Wear clean pajamas.            11.  Place clean sheets on your bed the night of your first shower and do not  sleep with pets. Day of Surgery : Do not apply any lotions/deodorants the morning of surgery.  Please wear clean clothes to the hospital/surgery center.   FAILURE TO FOLLOW THESE INSTRUCTIONS MAY RESULT IN THE CANCELLATION OF YOUR SURGERY PATIENT SIGNATURE_________________________________  NURSE SIGNATURE__________________________________  ________________________________________________________________________   Adam Phenix  An incentive spirometer is a tool that can help keep your lungs clear and active. This tool measures how well you are filling your lungs with each breath. Taking long deep breaths may help reverse or decrease the chance of developing breathing (pulmonary) problems (especially infection) following:  A long period of time when you are unable to move or be active. BEFORE THE PROCEDURE   If the spirometer includes an indicator to show your best effort, your nurse or respiratory therapist will set it to a desired goal.  If possible, sit up straight or lean slightly forward. Try not to slouch.  Hold the incentive spirometer in an upright position. INSTRUCTIONS FOR USE  1. Sit on the edge of your bed if possible, or sit up as far as you can in bed or on a chair. 2. Hold the incentive spirometer in an upright position. 3. Breathe out normally. 4. Place  the mouthpiece in your mouth and seal your lips tightly around it. 5. Breathe in slowly and as deeply as possible, raising the piston or the ball toward the top of the column. 6. Hold your breath for 3-5 seconds or for as long as possible. Allow the piston or ball to fall to the bottom of the column. 7. Remove the mouthpiece from your mouth and breathe out normally. 8. Rest for a few seconds and repeat Steps 1 through 7 at least 10 times every 1-2 hours when you are awake. Take your time and take a few normal breaths between deep breaths. 9. The spirometer may include an indicator to show your best effort. Use the indicator as a goal to work toward during each repetition. 10. After each set of 10 deep breaths, practice coughing to be sure your lungs are clear. If you have an incision (the cut made at the time of surgery), support your incision when coughing by placing a pillow or rolled up towels firmly against it. Once you are able to get out of bed, walk around indoors and cough well. You may stop using the incentive spirometer when instructed by your caregiver.  RISKS AND COMPLICATIONS  Take your time so you do not get dizzy or light-headed.  If you are in pain, you may need to take or ask for pain medication before doing incentive spirometry. It is harder to take a deep breath if you are having pain. AFTER USE  Rest and breathe slowly and easily.  It can be helpful to keep track of a log of your progress. Your caregiver can provide you with a simple table to help with this. If you are using the spirometer at home, follow these instructions: [place] IF:   You are having difficultly using the spirometer.  You have trouble using the spirometer as often as instructed.  Your pain medication is not giving enough relief while using the spirometer.  You develop fever of 100.5 F (38.1 C) or higher. SEEK IMMEDIATE MEDICAL CARE IF:   You cough up bloody  sputum that had not been  present before.  You develop fever of 102 F (38.9 C) or greater.  You develop worsening pain at or near the incision site. MAKE SURE YOU:   Understand these instructions.  Will watch your condition.  Will get help right away if you are not doing well or get worse. Document Released: 01/16/2007 Document Revised: 11/28/2011 Document Reviewed: 03/19/2007 ExitCare Patient Information 2014 Exit[employer], [place].   ________________________________________________________________________  WHAT IS A BLOOD TRANSFUSION? Blood Transfusion Information  A transfusion is the replacement of blood or some of its parts. Blood is made up of multiple cells which provide different functions.  Red blood cells carry oxygen and are used for blood loss replacement.  White blood cells fight against infection.  Platelets control bleeding.  Plasma helps clot blood.  Other blood products are available for specialized needs, such as hemophilia or other clotting disorders. BEFORE THE TRANSFUSION  Who gives blood for transfusions?   Healthy volunteers who are fully evaluated to make sure their blood is safe. This is blood bank blood. Transfusion therapy is the safest it has ever been in the practice of medicine. Before blood is taken from a donor, a complete history is taken to make sure that person has no history of diseases nor engages in risky social behavior (examples are intravenous drug use or sexual activity with multiple partners). The donor's travel history is screened to minimize risk of transmitting infections, such as malaria. The donated blood is tested for signs of infectious diseases, such as HIV and hepatitis. The blood is then tested to be sure it is compatible with you in order to minimize the chance of a transfusion reaction. If you or a relative donates blood, this is often done in anticipation of surgery and is not appropriate for emergency situations. It takes many days to process the donated  blood. RISKS AND COMPLICATIONS Although transfusion therapy is very safe and saves many lives, the main dangers of transfusion include:   Getting an infectious disease.  Developing a transfusion reaction. This is an allergic reaction to something in the blood you were given. Every precaution is taken to prevent this. The decision to have a blood transfusion has been considered carefully by your caregiver before blood is given. Blood is not given unless the benefits outweigh the risks. AFTER THE TRANSFUSION  Right after receiving a blood transfusion, you will usually feel much better and more energetic. This is especially true if your red blood cells have gotten low (anemic). The transfusion raises the level of the red blood cells which carry oxygen, and this usually causes an energy increase.  The nurse administering the transfusion will monitor you carefully for complications. HOME CARE INSTRUCTIONS  No special instructions are needed after a transfusion. You may find your energy is better. Speak with your caregiver about any limitations on activity for underlying diseases you may have. SEEK MEDICAL CARE IF:   Your condition is not improving after your transfusion.  You develop redness or irritation at the intravenous (IV) site. SEEK IMMEDIATE MEDICAL CARE IF:  Any of the following symptoms occur over the next 12 hours:  Shaking chills.  You have a temperature by mouth above 102 F (38.9 C), not controlled by medicine.  Chest, back, or muscle pain.  People around you feel you are not acting correctly or are confused.  Shortness of breath or difficulty breathing.  Dizziness and fainting.  You get a rash or develop hives.  You have a  decrease in urine output.  Your urine turns a dark color or changes to pink, red, or brown. Any of the following symptoms occur over the next 10 days:  You have a temperature by mouth above 102 F (38.9 C), not controlled by  medicine.  Shortness of breath.  Weakness after normal activity.  The white part of the eye turns yellow (jaundice).  You have a decrease in the amount of urine or are urinating less often.  Your urine turns a dark color or changes to pink, red, or brown. Document Released: 09/02/2000 Document Revised: 11/28/2011 Document Reviewed: 04/21/2008 [facility] Patient Information 2014 [place], [place].  _______________________________________________________________________

## 2019-06-21 ENCOUNTER — Other Ambulatory Visit: Payer: Self-pay | Admitting: Internal Medicine

## 2019-06-21 ENCOUNTER — Other Ambulatory Visit: Payer: Self-pay | Admitting: Physician Assistant

## 2019-06-24 ENCOUNTER — Other Ambulatory Visit: Payer: Self-pay

## 2019-06-24 ENCOUNTER — Encounter (HOSPITAL_COMMUNITY)
Admission: RE | Admit: 2019-06-24 | Discharge: 2019-06-24 | Disposition: A | Discharge status: Home / Self Care | Payer: Medicare HMO | Source: Ambulatory Visit | Attending: Orthopedic Surgery | Admitting: Orthopedic Surgery

## 2019-06-24 ENCOUNTER — Encounter (HOSPITAL_COMMUNITY): Payer: Self-pay

## 2019-06-24 DIAGNOSIS — M1711 Unilateral primary osteoarthritis, right knee: Secondary | ICD-10-CM | POA: Diagnosis not present

## 2019-06-24 DIAGNOSIS — Z01818 Encounter for other preprocedural examination: Secondary | ICD-10-CM | POA: Diagnosis not present

## 2019-06-24 LAB — COMPREHENSIVE METABOLIC PANEL
ALT: 21 U/L (ref 0–44)
AST: 22 U/L (ref 15–41)
Albumin: 4 g/dL (ref 3.5–5.0)
Alkaline Phosphatase: 95 U/L (ref 38–126)
Anion gap: 10 (ref 5–15)
BUN: 19 mg/dL (ref 8–23)
CO2: 21 mmol/L — ABNORMAL LOW (ref 22–32)
Calcium: 8.9 mg/dL (ref 8.9–10.3)
Chloride: 106 mmol/L (ref 98–111)
Creatinine, Ser: 0.84 mg/dL (ref 0.61–1.24)
GFR calc Af Amer: >60 mL/min (ref >60)
GFR calc non Af Amer: >60 mL/min (ref >60)
Glucose, Bld: 93 mg/dL (ref 70–99)
Potassium: 4.2 mmol/L (ref 3.5–5.1)
Sodium: 137 mmol/L (ref 135–145)
Total Bilirubin: 0.7 mg/dL (ref 0.3–1.2)
Total Protein: 6.9 g/dL (ref 6.5–8.1)

## 2019-06-24 LAB — CBC WITH DIFFERENTIAL/PLATELET
Abs Immature Granulocytes: 0.02 10*3/uL (ref 0.00–0.07)
Basophils Absolute: 0.1 10*3/uL (ref 0.0–0.1)
Basophils Relative: 1 %
Eosinophils Absolute: 0.5 10*3/uL (ref 0.0–0.5)
Eosinophils Relative: 8 %
HCT: 48.5 % (ref 39.0–52.0)
Hemoglobin: 15.1 g/dL (ref 13.0–17.0)
Immature Granulocytes: 0 %
Lymphocytes Relative: 27 %
Lymphs Abs: 1.6 10*3/uL (ref 0.7–4.0)
MCH: 24.3 pg — ABNORMAL LOW (ref 26.0–34.0)
MCHC: 31.1 g/dL (ref 30.0–36.0)
MCV: 78 fL — ABNORMAL LOW (ref 80.0–100.0)
Monocytes Absolute: 0.7 10*3/uL (ref 0.1–1.0)
Monocytes Relative: 12 %
Neutro Abs: 3.1 10*3/uL (ref 1.7–7.7)
Neutrophils Relative %: 52 %
Platelets: 220 10*3/uL (ref 150–400)
RBC: 6.22 MIL/uL — ABNORMAL HIGH (ref 4.22–5.81)
RDW: 15.8 % — ABNORMAL HIGH (ref 11.5–15.5)
WBC: 5.9 10*3/uL (ref 4.0–10.5)
nRBC: 0 % (ref 0.0–0.2)

## 2019-06-24 LAB — PROTIME-INR
INR: 1.1 (ref 0.8–1.2)
Prothrombin Time: 13.6 seconds (ref 11.4–15.2)

## 2019-06-24 LAB — SURGICAL PCR SCREEN
MRSA, PCR: NEGATIVE
Staphylococcus aureus: NEGATIVE

## 2019-06-24 LAB — APTT: aPTT: 34 seconds (ref 24–36)

## 2019-06-24 NOTE — Progress Notes (Signed)
PCP - Dr. Marshall Cork Cardiologist - Dr. Jerilynn Mages cooper  Chest x-ray - 06/09/19 EKG - 07/18/17 Stress Test - no ECHO - no Cardiac Cath - 06/08/17  Sleep Study - yes CPAP - yes  Fasting Blood Sugar - NA Checks Blood Sugar _____ times a day  Blood Thinner Instructions:stop Plavix 5 days prior to surgery Aspirin Instructions:instructions given by Dr. Anne Fu office Last Dose:He will take his last dose 06/26/19  Anesthesia review:   Patient denies shortness of breath, fever, cough and chest pain at PAT appointment yes  Patient verbalized understanding of instructions that were given to them at the PAT appointment. Patient was also instructed that they will need to review over the PAT instructions again at home before surgery. Yes  The Patient said that it was his understanding that he was having his left knee done. The orders were for Total  Right knee. Pt signed and changed consent before telling me it was not correct. That consent was not witnessed. The office was called to alert them of the discrepancy. Message was given to the PA . A new consent is on the chart to be signed DOS.

## 2019-06-25 NOTE — Progress Notes (Signed)
Anesthesia Chart Review   Case: [id number] Date/Time: 07/01/19 1120   Procedure: LEFT TOTAL KNEE ARTHROPLASTY (Left Knee) - 6min   Anesthesia type: Choice   Pre-op diagnosis: LEFT knee osteoarthritis   [facility] 09 / [facility]   Surgeon: Gaynelle Arabian, MD      DISCUSSION:73 y.o. former smoker (45 pack years, quit 04/23/94) with h/o CAD (CABG x 3 1989, S/p multiple PCIs since CABG), HLD, GERD, HTN, asthma, OSA on CPAP, left knee OA scheduled for above procedure 07/01/2019 with Dr. Gaynelle Arabian.   Pt last seen by cardiology 05/31/2019.  Seen by Richardson Dopp, PA-C.  Per OV note, "The Revised Cardiac Risk Index indicates that his Perioperative Risk of Major Cardiac Event is (%): 0.9.  Therefore, he is at low risk for perioperative complications.  His Functional Capacity in METs is good at 5.07 as indicated by the Duke Activity Status Index (DASI).  According to ACC/AHA guidelines, no further cardiovascular testing needed.  The patient may proceed to surgery at acceptable risk.   If the bleeding risk is too great, he may hold Plavix for 5 days prior to his surgery and resume it postoperatively as soon as it is felt to be safe."  Anticipate pt can proceed with planned procedure barring acute status change.   VS: BP 139/85   Pulse 69   Temp 36.9 C (Oral)   Resp 18   Ht 6' (1.829 m)   Wt 112 kg   SpO2 98%   BMI 33.50 kg/m   PROVIDERS: Biagio Borg, MD is PCP   Sherren Mocha, MD is Cardiologist  LABS: Labs reviewed: Acceptable for surgery. (all labs ordered are listed, but only abnormal results are displayed)  Labs Reviewed  CBC WITH DIFFERENTIAL/PLATELET - Abnormal; Notable for the following components:      Result Value   RBC 6.22 (*)    MCV 78.0 (*)    MCH 24.3 (*)    RDW 15.8 (*)    All other components within normal limits  COMPREHENSIVE METABOLIC PANEL - Abnormal; Notable for the following components:   CO2 21 (*)    All other components within normal limits  SURGICAL  PCR SCREEN  APTT  PROTIME-INR  TYPE AND SCREEN     IMAGES:   EKG: 07/18/2018 Rate 71 bpm Normal sinus rhythm  Inferior infarct, age undetermined   CV: Cardiac Cath 06/08/2017  Mid RCA to Dist RCA lesion, 100 %stenosed.  SVG.  Origin to Prox Graft lesion, 50 %stenosed.  Ost LAD lesion, 100 %stenosed.  LIMA.  Dist LAD lesion, 30 %stenosed.  Ost 2nd Mrg to 2nd Mrg lesion, 0 %stenosed.  2nd Mrg lesion, 40 %stenosed.  Prox RCA lesion, 80 %stenosed.  The left ventricular systolic function is normal.  LV end diastolic pressure is normal.  The left ventricular ejection fraction is 50-55% by visual estimate. Past Medical History:  Diagnosis Date  . ALLERGIC RHINITIS 08/20/2007  . Anemia   . Arthritis    "back; knees; shoulders"  (06/08/2017)  . Asthma    "very slight"   . Barrett esophagus   . BENIGN PROSTATIC HYPERTROPHY 08/20/2007  . Coronary artery disease involving native heart with angina pectoris ([place])    a. s/p CABG x 3 in 1989;  b. MI in 1995 with BMS to VG->RCA;  c. 07/2000 PCI/BMS to native LCX;  d. Known CTO of VG->Diag;  e. Mult caths w/ stable anatomy;  f. 04/2013 Cath: LM nl, ostLAD 100, LCX 10%isr,  71m, OM1/2/3 min irregs, RI min irregs, pRCA 80-90%/ 100& mid, VG->RCA ostial Stent w/ 20-30isr, LIMA->LAD ok, Occluded VG-Diag. EF 60%-->Med Rx.  . ELBOW PAIN, LEFT 04/15/2008  . ERECTILE DYSFUNCTION 08/20/2007  . Gallbladder polyp 08/27/2015   Noted on [place] Abd  . GERD (gastroesophageal reflux disease)   . Hepatic cyst 09/06/2015   Large simple right hepatic lobe, noted on [place] Abd  . History of cardiomegaly 05/15/2013   Noted on CXR  . History of meningitis    2nd grade, 8th grade, 9th grade, age 89  . HYPERLIPIDEMIA TYPE I / IV 09/03/2008  . HYPERTENSION    "been on RX since CABG but never a problem" (06/08/2017)  . INSOMNIA-SLEEP DISORDER-UNSPEC 03/17/2008  . Microcytosis   . Myocardial infarction [facility]) June 1995  . OSA on CPAP   . PREMATURE VENTRICULAR  CONTRACTIONS 08/20/2007  . SCROTAL MASS 10/09/2008  . URINARY INCONTINENCE, MALE 10/09/2008  . UTI 04/24/2009   "just this once" (05/15/2013)    Past Surgical History:  Procedure Laterality Date  . ANKLE FRACTURE SURGERY Right 1984  . BACK SURGERY    . CARDIAC CATHETERIZATION  X 2  . CARDIAC CATHETERIZATION N/A 04/01/2016   Procedure: Left Heart Cath and Cors/Grafts Angiography;  Surgeon: Leonie Man, MD;  Location: [place] [facility] LAB;  Service: Cardiovascular;  Laterality: N/A;  . CARDIAC CATHETERIZATION N/A 04/01/2016   Procedure: Intravascular Pressure Wire/FFR Study;  Surgeon: Leonie Man, MD;  Location: [place] CV LAB;  Service: Cardiovascular;  Laterality: N/A;  . CARDIAC CATHETERIZATION N/A 04/01/2016   Procedure: Coronary Stent Intervention;  Surgeon: Leonie Man, MD;  Location: [place] CV LAB;  Service: Cardiovascular;  Laterality: N/A;  . CARDIAC CATHETERIZATION  06/08/2017  . COLONOSCOPY  08/31/2011  . COLONOSCOPY W/ BIOPSIES AND POLYPECTOMY  11/20/2003   adenomatous polyp, internal and external hemorrhoids  . CORONARY ANGIOPLASTY  95, 98, 2000, 2001  . [place]- 04/01/2016   "total of 4 stents counting today" (04/01/2016)  . CORONARY ARTERY BYPASS GRAFT  1989   triple bypass [facility]  . CYSTOSCOPY  08/07/2009   and right hydrocelectomy  . FOOT TENDON SURGERY Left 2012   "donor tendon"   . HEMI-MICRODISCECTOMY LUMBAR LAMINECTOMY LEVEL 1 Right 09/18/2013   Procedure: CENTRAL DECOMPRESSION @ L4-5 FOR SPINAL STENOSIS. EXCISION OF SYNOVIAL CYST @ L4-5 RIGHT, MICRODISCECTOMY @L4 -5 RIGHT ;  Surgeon: Tobi Bastos, MD;  [facility];  Service: Orthopedics;  Laterality: Right;  . INGUINAL HERNIA REPAIR Bilateral 1970's   "2 on one side; 1 on the other"   . KNEE ARTHROSCOPY WITH MEDIAL MENISECTOMY Left 09/18/2018   Procedure: DIAGNOSTIC KNEE ARTHROSCOPY WITH MEDIAL MENISECTOMY;  Surgeon: Latanya Maudlin, MD;  [facility];   Service: Orthopedics;  Laterality: Left;  13min  . LEFT HEART CATH AND CORS/GRAFTS ANGIOGRAPHY N/A 06/08/2017   Procedure: LEFT HEART CATH AND CORS/GRAFTS ANGIOGRAPHY;  Surgeon: Lorretta Harp, MD;  Location: [place] [facility] LAB;  Service: Cardiovascular;  Laterality: N/A;  . LEFT HEART CATHETERIZATION WITH CORONARY ANGIOGRAM Bilateral 05/15/2013   Procedure: LEFT HEART CATHETERIZATION WITH CORONARY ANGIOGRAM;  Surgeon: Wellington Hampshire, MD;  Location: [place] CATH LAB;  Service: Cardiovascular;  Laterality: Bilateral;  . SHOULDER OPEN ROTATOR CUFF REPAIR Right 2009   [place]  . SURGERY SCROTAL / TESTICULAR Right 2012   "removed the sac" (05/15/2013)  . TONSILLECTOMY AND ADENOIDECTOMY  ~1961  . TRANSURETHRAL RESECTION OF PROSTATE  10/02/2009  [place] transurethral resection   . UPPER GI ENDOSCOPY  09/06/2011    MEDICATIONS: . albuterol (PROAIR HFA) 108 (90 BASE) MCG/ACT inhaler  . amitriptyline (ELAVIL) 25 MG tablet  . atenolol (TENORMIN) 25 MG tablet  . atorvastatin (LIPITOR) 40 MG tablet  . clopidogrel (PLAVIX) 75 MG tablet  . isosorbide mononitrate (IMDUR) 30 MG 24 hr tablet  . lisinopril (PRINIVIL,ZESTRIL) 10 MG tablet  . nitroGLYCERIN (NITROSTAT) 0.4 MG SL tablet  . omeprazole (PRILOSEC) 20 MG capsule  . pantoprazole (PROTONIX) 40 MG tablet  . sennosides-docusate sodium (SENOKOT-S) 8.6-50 MG tablet  . sulindac (CLINORIL) 200 MG tablet  . temazepam (RESTORIL) 30 MG capsule   No current facility-administered medications for this encounter.     Maia Plan [facility] Pre-Surgical Testing [phone] 06/25/19  4:03 PM

## 2019-06-25 NOTE — Anesthesia Preprocedure Evaluation (Addendum)
Anesthesia Evaluation  Patient identified by MRN, date of birth, ID band Patient awake    Reviewed: Allergy & Precautions, NPO status , Patient's Chart, lab work & pertinent test results  Airway Mallampati: II  TM Distance: >3 FB Neck ROM: Full    Dental  (+) Missing, Poor Dentition   Pulmonary neg pulmonary ROS, former smoker,    Pulmonary exam normal breath sounds clear to auscultation       Cardiovascular hypertension, + angina + CAD, + Past MI, + Cardiac Stents (last in 2014) and + CABG (1989)  Normal cardiovascular exam Rhythm:Regular Rate:Normal  EKG: 07/18/2018 Rate 71 bpm Normal sinus rhythm  Inferior infarct, age undetermined   CV: Cardiac Cath 06/08/2017 Mid RCA to Dist RCA lesion, 100 %stenosed  SVG. Origin to Prox Graft lesion, 50 %stenosed. Ost LAD lesion, 100 %stenosed. LIMA. Dist LAD lesion, 30 %stenosed. Ost 2nd Mrg to 2nd Mrg lesion, 0 %stenosed. 2nd Mrg lesion, 40 %stenosed. Prox RCA lesion, 80 %stenosed. The left ventricular systolic function is normal. LV end diastolic pressure is normal. The left ventricular ejection fraction is 50-55% by visual estimate.   Neuro/Psych negative neurological ROS  negative psych ROS   GI/Hepatic Neg liver ROS, GERD  ,  Endo/Other  negative endocrine ROS  Renal/GU negative Renal ROS  negative genitourinary   Musculoskeletal  (+) Arthritis ,   Abdominal   Peds  Hematology   Anesthesia Other Findings On plavix, last dose 10/5   Reproductive/Obstetrics                          Anesthesia Physical Anesthesia Plan  ASA: III  Anesthesia Plan: Spinal and Regional   Post-op Pain Management:  Regional for Post-op pain   Induction:   PONV Risk Score and Plan: 1 and Treatment may vary due to age or medical condition and Propofol infusion  Airway Management Planned: Natural Airway  Additional Equipment:   Intra-op  Plan:   Post-operative Plan:   Informed Consent: I have reviewed the patients History and Physical, chart, labs and discussed the procedure including the risks, benefits and alternatives for the proposed anesthesia with the patient or authorized representative who has indicated his/her understanding and acceptance.     Dental advisory given  Plan Discussed with: CRNA  Anesthesia Plan Comments: (See PAT note 06/24/2019, Konrad Felix, PA-C)       Anesthesia Quick Evaluation

## 2019-06-26 NOTE — H&P (Signed)
TOTAL KNEE ADMISSION H&P  Patient is being admitted for left total knee arthroplasty.  Subjective:  Chief Complaint:left knee pain.  HPI: Robert [name], 73 y.o. male, has a history of pain and functional disability in the left knee due to arthritis and has failed non-surgical conservative treatments for greater than 12 weeks to includecorticosteriod injections, viscosupplementation injections and activity modification.  Onset of symptoms was gradual, starting 1 years ago with gradually worsening course since that time. The patient noted prior procedures on the knee to include  arthroscopy and menisectomy on the left knee(s).  Patient currently rates pain in the left knee(s) at 5 out of 10 with activity. Patient has worsening of pain with activity and weight bearing and pain that interferes with activities of daily living.  Patient has evidence of bone-on-bone arthritis in the medial compartment with significant patellofemoral narrowing. by imaging studies. There is no active infection.  Patient Active Problem List   Diagnosis Date Noted   Acute hearing loss, right 09/21/2018   Finger laceration 10/09/2017   Cough 10/17/2016   Right-sided chest pain 10/11/2016   Corn of foot 06/10/2016   Chest pain 04/01/2016   Coronary artery disease involving native heart with angina pectoris (HCC)    S/P CABG x 3    Hypertensive heart disease with heart failure (HCC)    Angina pectoris ([place])    Hearing loss of both ears 05/21/2015   Rash 05/21/2015   Rash and nonspecific skin eruption 09/24/2014   Spinal stenosis, lumbar region, with neurogenic claudication 09/18/2013   Unstable angina ([place]) 05/16/2013   OSA (obstructive sleep apnea) 05/16/2013   Morbid obesity ([place]) 05/16/2013   Barrett's esophagus 09/12/2011   Iron deficiency anemia, unspecified 08/24/2011   Long term current use of Plavix and aspirin due to coronary artery disease 08/19/2011   Personal history of  adenomatous colonic polyps 04/30/2011   Impaired glucose tolerance 04/29/2011   Microcytosis 04/29/2011   Preventative health care 04/29/2011   SCROTAL MASS 10/09/2008   URINARY INCONTINENCE, MALE 10/09/2008   HYPERLIPIDEMIA TYPE I / IV 09/03/2008   ELBOW PAIN, LEFT 04/15/2008   INSOMNIA-SLEEP DISORDER-UNSPEC 03/17/2008   Hyperlipidemia type IV 08/20/2007   ERECTILE DYSFUNCTION 08/20/2007   Essential hypertension 08/20/2007   PREMATURE VENTRICULAR CONTRACTIONS 08/20/2007   ALLERGIC RHINITIS 08/20/2007   BENIGN PROSTATIC HYPERTROPHY 08/20/2007   LOW BACK PAIN 08/20/2007   Past Medical History:  Diagnosis Date   ALLERGIC RHINITIS 08/20/2007   Anemia    Arthritis    "back; knees; shoulders"  (06/08/2017)   Asthma    "very slight"    Barrett esophagus    BENIGN PROSTATIC HYPERTROPHY 08/20/2007   Coronary artery disease involving native heart with angina pectoris ([place])    a. s/p CABG x 3 in 1989;  b. MI in 1995 with BMS to VG->RCA;  c. 07/2000 PCI/BMS to native LCX;  d. Known CTO of VG->Diag;  e. Mult caths w/ stable anatomy;  f. 04/2013 Cath: LM nl, ostLAD 100, LCX 10%isr, 91m, OM1/2/3 min irregs, RI min irregs, pRCA 80-90%/ 100& mid, VG->RCA ostial Stent w/ 20-30isr, LIMA->LAD ok, Occluded VG-Diag. EF 60%-->Med Rx.   ELBOW PAIN, LEFT 04/15/2008   ERECTILE DYSFUNCTION 08/20/2007   Gallbladder polyp 08/27/2015   Noted on [place] Abd   GERD (gastroesophageal reflux disease)    Hepatic cyst 09/06/2015   Large simple right hepatic lobe, noted on [place] Abd   History of cardiomegaly 05/15/2013   Noted on CXR   History of meningitis  2nd grade, 8th grade, 9th grade, age 35   HYPERLIPIDEMIA TYPE I / IV 09/03/2008   HYPERTENSION    "been on RX since CABG but never a problem" (06/08/2017)   INSOMNIA-SLEEP DISORDER-UNSPEC 03/17/2008   Microcytosis    Myocardial infarction [facility]) June 1995   OSA on CPAP    PREMATURE VENTRICULAR CONTRACTIONS 08/20/2007   SCROTAL  MASS 10/09/2008   URINARY INCONTINENCE, MALE 10/09/2008   UTI 04/24/2009   "just this once" (05/15/2013)    Past Surgical History:  Procedure Laterality Date   ANKLE FRACTURE SURGERY Right [place]  X 2   CARDIAC CATHETERIZATION N/A 04/01/2016   Procedure: Left Heart Cath and Cors/Grafts Angiography;  Surgeon: Leonie Man, MD;  Location: [place] [facility];  Service: Cardiovascular;  Laterality: N/A;   CARDIAC CATHETERIZATION N/A 04/01/2016   Procedure: Intravascular Pressure Wire/FFR Study;  Surgeon: Leonie Man, MD;  Location: [place] [facility];  Service: Cardiovascular;  Laterality: N/A;   CARDIAC CATHETERIZATION N/A 04/01/2016   Procedure: Coronary Stent Intervention;  Surgeon: Leonie Man, MD;  Location: [place] [facility];  Service: Cardiovascular;  Laterality: N/A;   CARDIAC CATHETERIZATION  06/08/2017   COLONOSCOPY  08/31/2011   COLONOSCOPY W/ BIOPSIES AND POLYPECTOMY  11/20/2003   adenomatous polyp, internal and external hemorrhoids   CORONARY ANGIOPLASTY  95, 98, 2000, [place]- 04/01/2016   "total of 4 stents counting today" (04/01/2016)   CORONARY ARTERY BYPASS GRAFT  1989   triple bypass MUSC   CYSTOSCOPY  08/07/2009   and right hydrocelectomy   FOOT TENDON SURGERY Left 2012   "donor tendon"    HEMI-MICRODISCECTOMY LUMBAR LAMINECTOMY LEVEL 1 Right 09/18/2013   Procedure: CENTRAL DECOMPRESSION @ L4-5 FOR SPINAL STENOSIS. EXCISION OF SYNOVIAL CYST @ L4-5 RIGHT, MICRODISCECTOMY @L4 -5 RIGHT ;  Surgeon: Tobi Bastos, MD;  Location: [facility];  Service: Orthopedics;  Laterality: Right;   INGUINAL HERNIA REPAIR Bilateral 1970's   "2 on one side; 1 on the other"    KNEE ARTHROSCOPY WITH MEDIAL MENISECTOMY Left 09/18/2018   Procedure: DIAGNOSTIC KNEE ARTHROSCOPY WITH MEDIAL MENISECTOMY;  Surgeon: Latanya Maudlin, MD;  Location: [facility];  Service: Orthopedics;  Laterality:  Left;  22min   LEFT HEART CATH AND CORS/GRAFTS ANGIOGRAPHY N/A 06/08/2017   Procedure: LEFT HEART CATH AND CORS/GRAFTS ANGIOGRAPHY;  Surgeon: Lorretta Harp, MD;  Location: [place] [facility];  Service: Cardiovascular;  Laterality: N/A;   LEFT HEART CATHETERIZATION WITH CORONARY ANGIOGRAM Bilateral 05/15/2013   Procedure: LEFT HEART CATHETERIZATION WITH CORONARY ANGIOGRAM;  Surgeon: Wellington Hampshire, MD;  Location: [place] [facility];  Service: Cardiovascular;  Laterality: Bilateral;   SHOULDER OPEN ROTATOR CUFF REPAIR Right 2009   [facility]re   SURGERY SCROTAL / TESTICULAR Right 2012   "removed the sac" (05/15/2013)   TONSILLECTOMY AND ADENOIDECTOMY  ~1961   TRANSURETHRAL RESECTION OF PROSTATE  10/02/2009   [place] transurethral resection    UPPER GI ENDOSCOPY  09/06/2011    No current facility-administered medications for this encounter.    Current Outpatient Medications  Medication Sig Dispense Refill Last Dose   albuterol (PROAIR HFA) 108 (90 BASE) MCG/ACT inhaler Inhale 1-2 puffs into the lungs every 6 (six) hours as needed for wheezing or shortness of breath.       amitriptyline (ELAVIL) 25 MG tablet Take 1 tablet (25 mg total) by mouth at bedtime. Reported on 01/25/2016 (Patient  taking differently: Take 25 mg by mouth at bedtime. ) 90 tablet 1    atenolol (TENORMIN) 25 MG tablet TAKE 1 TABLET AT BEDTIME (Patient taking differently: Take 25 mg by mouth at bedtime. ) 90 tablet 1    atorvastatin (LIPITOR) 40 MG tablet Take 1 tablet (40 mg total) by mouth daily. (Patient taking differently: Take 40 mg by mouth daily at 2 PM. ) 90 tablet 3    clopidogrel (PLAVIX) 75 MG tablet TAKE 1 TABLET (75 MG TOTAL) BY MOUTH DAILY. (Patient taking differently: Take 75 mg by mouth daily. ) 90 tablet 0    lisinopril (PRINIVIL,ZESTRIL) 10 MG tablet TAKE 1 TABLET AT BEDTIME (Patient taking differently: Take 10 mg by mouth at bedtime. ) 90 tablet 1    nitroGLYCERIN (NITROSTAT) 0.4 MG SL tablet Place 1 tablet  (0.4 mg total) under the tongue every 5 (five) minutes as needed for chest pain. 25 tablet 3    omeprazole (PRILOSEC) 20 MG capsule Take 20 mg by mouth daily.      sennosides-docusate sodium (SENOKOT-S) 8.6-50 MG tablet Take 1 tablet by mouth at bedtime.      sulindac (CLINORIL) 200 MG tablet Take 200 mg by mouth 2 (two) times daily.       temazepam (RESTORIL) 30 MG capsule Take 1 capsule (30 mg total) by mouth at bedtime as needed for sleep. (Patient taking differently: Take 30 mg by mouth at bedtime. ) 30 capsule 5    isosorbide mononitrate (IMDUR) 30 MG 24 hr tablet TAKE 1 TABLET TWICE DAILY 180 tablet 3    pantoprazole (PROTONIX) 40 MG tablet Take 1 tablet (40 mg total) by mouth daily. (Patient not taking: Reported on 06/19/2019) 14 tablet 0 Not Taking at Unknown time   Allergies  Allergen Reactions   Metoprolol Other (See Comments)    severe bradycardia   Adhesive [Tape] Rash    Social History   Tobacco Use   Smoking status: Former Smoker    Packs/day: 1.00    Years: 45.00    Pack years: 45.00    Types: Cigarettes    Quit date: 04/23/1994    Years since quitting: 25.1   Smokeless tobacco: Never Used  Substance Use Topics   Alcohol use: Not Currently    Comment:  "I've  quit drinking in 1989"    Family History  Problem Relation Age of Onset   Coronary artery disease Father    Coronary artery disease Mother        pacemaker   Heart disease Brother    Esophageal cancer Brother      Review of Systems  Constitutional: Negative for chills and fever.  Respiratory: Negative for cough and shortness of breath.   Cardiovascular: Negative for chest pain and palpitations.  Gastrointestinal: Negative for nausea and vomiting.  Musculoskeletal: Positive for joint pain.    Objective:  Physical Exam Patient is a 73 year old male.  Well nourished and well developed. General: Alert and oriented x3, cooperative and pleasant, no acute distress. Head: normocephalic,  atraumatic, neck supple. Eyes: EOMI. Respiratory: breath sounds clear in all fields, no wheezing, rales, or rhonchi. Cardiovascular: Regular rate and rhythm, no murmurs, gallops or rubs. Abdomen: non-tender to palpation and soft, normoactive bowel sounds.  Musculoskeletal: Left Knee Exam: No effusion. Slight varus deformity. Range of motion is 0 to 125 degrees. Positive crepitus on range of motion of the knee. Positive medial joint line tenderness No lateral joint line tenderness. Stable knee.  Calves soft and nontender.  Motor function intact in LE. Strength 5/5 LE bilaterally. Neuro: Distal pulses 2+. Sensation to light touch intact in LE.  Vital signs in last 24 hours:    Labs:  Estimated body mass index is 33.5 kg/m as calculated from the following:   Height as of 06/24/19: 6' (1.829 m).   Weight as of 06/24/19: 112 kg.   Imaging Review Plain radiographs demonstrate severe degenerative joint disease of the left knee(s). The overall alignment isneutral. The bone quality appears to be adequate for age and reported activity level.  Assessment/Plan:  End stage arthritis, left knee   The patient history, physical examination, clinical judgment of the provider and imaging studies are consistent with end stage degenerative joint disease of the left knee(s) and total knee arthroplasty is deemed medically necessary. The treatment options including medical management, injection therapy arthroscopy and arthroplasty were discussed at length. The risks and benefits of total knee arthroplasty were presented and reviewed. The risks due to aseptic loosening, infection, stiffness, patella tracking problems, thromboembolic complications and other imponderables were discussed. The patient acknowledged the explanation, agreed to proceed with the plan and consent was signed. Patient is being admitted for inpatient treatment for surgery, pain control, PT, OT, prophylactic antibiotics, VTE  prophylaxis, progressive ambulation and ADL's and discharge planning. The patient is planning to be discharged home.  Therapy Plans: outpatient therapy at [facility] Disposition: Home with wife Planned DVT Prophylaxis: Plavix + Aspirin (hx of MI, on plavix at home) DME needed: none PCP: Dr. Cathlean Cower Cardiologist: Dr. Sherren Mocha TXA: IV Allergies: metoprolol - bradycardia Anesthesia Concerns: none BMI: 33.9  Other: Hx of MI in the 1990's, s/p CABG. Hx of four cardiac stents.   Patient's anticipated LOS is less than 2 midnights, meeting these requirements: - Lives within 1 hour of care - Has a competent adult at home to recover with post-op recover - NO history of  - Chronic pain requiring opiods  - Diabetes  - Heart failure  - Stroke  - DVT/VTE  - Cardiac arrhythmia  - Respiratory Failure/COPD  - Renal failure  - Anemia  - Advanced Liver disease - Patient was instructed on what medications to stop prior to surgery. - Follow-up visit in 2 weeks with Dr. Wynelle Link - Begin physical therapy following surgery - Pre-operative lab work as pre-surgical testing - Prescriptions will be provided in hospital at time of discharge  Griffith Citron, PA-C Orthopedic Surgery [facility] [place] [phone]

## 2019-06-27 ENCOUNTER — Other Ambulatory Visit (HOSPITAL_COMMUNITY)
Admission: RE | Admit: 2019-06-27 | Discharge: 2019-06-27 | Disposition: A | Discharge status: Home / Self Care | Payer: Medicare HMO | Source: Ambulatory Visit | Attending: Orthopedic Surgery | Admitting: Orthopedic Surgery

## 2019-06-27 DIAGNOSIS — Z01812 Encounter for preprocedural laboratory examination: Secondary | ICD-10-CM | POA: Insufficient documentation

## 2019-06-27 DIAGNOSIS — Z20828 Contact with and (suspected) exposure to other viral communicable diseases: Secondary | ICD-10-CM | POA: Diagnosis not present

## 2019-06-28 LAB — NOVEL CORONAVIRUS, NAA (HOSP ORDER, SEND-OUT TO REF LAB; TAT 18-24 HRS): SARS-CoV-2, NAA: NOT DETECTED

## 2019-06-29 ENCOUNTER — Other Ambulatory Visit: Payer: Self-pay | Admitting: Internal Medicine

## 2019-06-30 MED ORDER — BUPIVACAINE LIPOSOME 1.3 % IJ SUSP
20.0000 mL | Freq: Once | INTRAMUSCULAR | Status: DC
  Filled 2019-06-30: qty 20

## 2019-07-01 ENCOUNTER — Encounter (HOSPITAL_COMMUNITY): Payer: Self-pay | Admitting: *Deleted

## 2019-07-01 ENCOUNTER — Ambulatory Visit (HOSPITAL_COMMUNITY)
Admission: RE | Admit: 2019-07-01 | Discharge: 2019-07-02 | Disposition: A | Discharge status: Home / Self Care | Payer: Medicare HMO | Source: Other Acute Inpatient Hospital | Attending: Orthopedic Surgery | Admitting: Orthopedic Surgery

## 2019-07-01 ENCOUNTER — Ambulatory Visit (HOSPITAL_COMMUNITY): Payer: Medicare HMO | Admitting: Physician Assistant

## 2019-07-01 ENCOUNTER — Encounter (HOSPITAL_COMMUNITY)
Admission: RE | Disposition: A | Discharge status: Home / Self Care | Payer: Self-pay | Source: Other Acute Inpatient Hospital | Attending: Orthopedic Surgery

## 2019-07-01 ENCOUNTER — Ambulatory Visit (HOSPITAL_COMMUNITY): Payer: Medicare HMO | Admitting: Anesthesiology

## 2019-07-01 ENCOUNTER — Other Ambulatory Visit: Payer: Self-pay

## 2019-07-01 DIAGNOSIS — M1712 Unilateral primary osteoarthritis, left knee: Secondary | ICD-10-CM | POA: Insufficient documentation

## 2019-07-01 DIAGNOSIS — G4700 Insomnia, unspecified: Secondary | ICD-10-CM | POA: Insufficient documentation

## 2019-07-01 DIAGNOSIS — Z955 Presence of coronary angioplasty implant and graft: Secondary | ICD-10-CM | POA: Insufficient documentation

## 2019-07-01 DIAGNOSIS — K2270 Barrett's esophagus without dysplasia: Secondary | ICD-10-CM | POA: Insufficient documentation

## 2019-07-01 DIAGNOSIS — I509 Heart failure, unspecified: Secondary | ICD-10-CM | POA: Insufficient documentation

## 2019-07-01 DIAGNOSIS — M25529 Pain in unspecified elbow: Secondary | ICD-10-CM | POA: Insufficient documentation

## 2019-07-01 DIAGNOSIS — G8918 Other acute postprocedural pain: Secondary | ICD-10-CM | POA: Diagnosis not present

## 2019-07-01 DIAGNOSIS — J45909 Unspecified asthma, uncomplicated: Secondary | ICD-10-CM | POA: Insufficient documentation

## 2019-07-01 DIAGNOSIS — I252 Old myocardial infarction: Secondary | ICD-10-CM | POA: Diagnosis not present

## 2019-07-01 DIAGNOSIS — H9193 Unspecified hearing loss, bilateral: Secondary | ICD-10-CM | POA: Diagnosis not present

## 2019-07-01 DIAGNOSIS — Z8719 Personal history of other diseases of the digestive system: Secondary | ICD-10-CM | POA: Insufficient documentation

## 2019-07-01 DIAGNOSIS — G479 Sleep disorder, unspecified: Secondary | ICD-10-CM | POA: Diagnosis not present

## 2019-07-01 DIAGNOSIS — Z6833 Body mass index (BMI) 33.0-33.9, adult: Secondary | ICD-10-CM | POA: Diagnosis not present

## 2019-07-01 DIAGNOSIS — K219 Gastro-esophageal reflux disease without esophagitis: Secondary | ICD-10-CM | POA: Diagnosis not present

## 2019-07-01 DIAGNOSIS — R21 Rash and other nonspecific skin eruption: Secondary | ICD-10-CM | POA: Diagnosis not present

## 2019-07-01 DIAGNOSIS — M48062 Spinal stenosis, lumbar region with neurogenic claudication: Secondary | ICD-10-CM | POA: Diagnosis not present

## 2019-07-01 DIAGNOSIS — Z91048 Other nonmedicinal substance allergy status: Secondary | ICD-10-CM | POA: Insufficient documentation

## 2019-07-01 DIAGNOSIS — I25119 Atherosclerotic heart disease of native coronary artery with unspecified angina pectoris: Secondary | ICD-10-CM | POA: Diagnosis not present

## 2019-07-01 DIAGNOSIS — Z79899 Other long term (current) drug therapy: Secondary | ICD-10-CM | POA: Insufficient documentation

## 2019-07-01 DIAGNOSIS — D649 Anemia, unspecified: Secondary | ICD-10-CM | POA: Insufficient documentation

## 2019-07-01 DIAGNOSIS — D509 Iron deficiency anemia, unspecified: Secondary | ICD-10-CM | POA: Insufficient documentation

## 2019-07-01 DIAGNOSIS — Z951 Presence of aortocoronary bypass graft: Secondary | ICD-10-CM | POA: Insufficient documentation

## 2019-07-01 DIAGNOSIS — G4733 Obstructive sleep apnea (adult) (pediatric): Secondary | ICD-10-CM | POA: Insufficient documentation

## 2019-07-01 DIAGNOSIS — E119 Type 2 diabetes mellitus without complications: Secondary | ICD-10-CM | POA: Diagnosis not present

## 2019-07-01 DIAGNOSIS — M1710 Unilateral primary osteoarthritis, unspecified knee: Secondary | ICD-10-CM | POA: Diagnosis present

## 2019-07-01 DIAGNOSIS — I110 Hypertensive heart disease with heart failure: Secondary | ICD-10-CM | POA: Diagnosis not present

## 2019-07-01 DIAGNOSIS — E785 Hyperlipidemia, unspecified: Secondary | ICD-10-CM | POA: Diagnosis not present

## 2019-07-01 DIAGNOSIS — Z87891 Personal history of nicotine dependence: Secondary | ICD-10-CM | POA: Insufficient documentation

## 2019-07-01 DIAGNOSIS — M179 Osteoarthritis of knee, unspecified: Secondary | ICD-10-CM | POA: Diagnosis present

## 2019-07-01 DIAGNOSIS — Z7902 Long term (current) use of antithrombotics/antiplatelets: Secondary | ICD-10-CM | POA: Insufficient documentation

## 2019-07-01 DIAGNOSIS — I2510 Atherosclerotic heart disease of native coronary artery without angina pectoris: Secondary | ICD-10-CM | POA: Diagnosis not present

## 2019-07-01 DIAGNOSIS — Z800 Family history of malignant neoplasm of digestive organs: Secondary | ICD-10-CM | POA: Insufficient documentation

## 2019-07-01 DIAGNOSIS — Z8249 Family history of ischemic heart disease and other diseases of the circulatory system: Secondary | ICD-10-CM | POA: Insufficient documentation

## 2019-07-01 DIAGNOSIS — I493 Ventricular premature depolarization: Secondary | ICD-10-CM | POA: Diagnosis not present

## 2019-07-01 DIAGNOSIS — Z888 Allergy status to other drugs, medicaments and biological substances status: Secondary | ICD-10-CM | POA: Insufficient documentation

## 2019-07-01 DIAGNOSIS — Z7951 Long term (current) use of inhaled steroids: Secondary | ICD-10-CM | POA: Insufficient documentation

## 2019-07-01 DIAGNOSIS — R32 Unspecified urinary incontinence: Secondary | ICD-10-CM | POA: Insufficient documentation

## 2019-07-01 HISTORY — PX: TOTAL KNEE ARTHROPLASTY: SHX125

## 2019-07-01 LAB — TYPE AND SCREEN
ABO/RH(D): A POS
Antibody Screen: POSITIVE
DAT, IgG: POSITIVE
Unit division: 0
Unit division: 0

## 2019-07-01 LAB — BPAM RBC
Blood Product Expiration Date: 202011112359
Blood Product Expiration Date: 202011112359
Unit Type and Rh: 6200
Unit Type and Rh: 6200

## 2019-07-01 SURGERY — ARTHROPLASTY, KNEE, TOTAL
Anesthesia: Choice | Site: Knee | Laterality: Left

## 2019-07-01 MED ORDER — STERILE WATER FOR IRRIGATION IR SOLN
Status: DC | PRN
  Administered 2019-07-01: 2000 mL

## 2019-07-01 MED ORDER — TRANEXAMIC ACID-NACL 1000-0.7 MG/100ML-% IV SOLN
1000.0000 mg | INTRAVENOUS | Status: AC
  Administered 2019-07-01: 12:00:00 1000 mg via INTRAVENOUS
  Filled 2019-07-01: qty 100

## 2019-07-01 MED ORDER — ONDANSETRON HCL 4 MG/2ML IJ SOLN
INTRAMUSCULAR | Status: DC | PRN
  Administered 2019-07-01: 4 mg via INTRAVENOUS

## 2019-07-01 MED ORDER — ALBUTEROL SULFATE (2.5 MG/3ML) 0.083% IN NEBU: 2.5000 mg | INHALATION_SOLUTION | Freq: Four times a day (QID) | RESPIRATORY_TRACT | Status: DC | PRN

## 2019-07-01 MED ORDER — DEXAMETHASONE SODIUM PHOSPHATE 10 MG/ML IJ SOLN: 8.0000 mg | Freq: Once | INTRAMUSCULAR | Status: DC

## 2019-07-01 MED ORDER — DIPHENHYDRAMINE HCL 12.5 MG/5ML PO ELIX: 12.5000 mg | ORAL_SOLUTION | ORAL | Status: DC | PRN

## 2019-07-01 MED ORDER — NITROGLYCERIN 0.4 MG SL SUBL: 0.4000 mg | SUBLINGUAL_TABLET | SUBLINGUAL | Status: DC | PRN

## 2019-07-01 MED ORDER — FENTANYL CITRATE (PF) 100 MCG/2ML IJ SOLN
50.0000 ug | INTRAMUSCULAR | Status: DC
  Administered 2019-07-01: 50 ug via INTRAVENOUS
  Filled 2019-07-01: qty 2

## 2019-07-01 MED ORDER — OXYCODONE HCL 5 MG PO TABS
5.0000 mg | ORAL_TABLET | ORAL | Status: DC | PRN
  Administered 2019-07-01 – 2019-07-02 (×3): 10 mg via ORAL
  Filled 2019-07-01 (×3): qty 2

## 2019-07-01 MED ORDER — CHLORHEXIDINE GLUCONATE 4 % EX LIQD: 60.0000 mL | Freq: Once | CUTANEOUS | Status: DC

## 2019-07-01 MED ORDER — ATENOLOL 25 MG PO TABS
25.0000 mg | ORAL_TABLET | Freq: Every day | ORAL | Status: DC
  Administered 2019-07-01: 25 mg via ORAL
  Filled 2019-07-01: qty 1

## 2019-07-01 MED ORDER — SODIUM CHLORIDE (PF) 0.9 % IJ SOLN
INTRAMUSCULAR | Status: DC | PRN
  Administered 2019-07-01: 60 mL

## 2019-07-01 MED ORDER — PROPOFOL 10 MG/ML IV BOLUS
INTRAVENOUS | Status: AC
  Filled 2019-07-01: qty 20

## 2019-07-01 MED ORDER — DOCUSATE SODIUM 100 MG PO CAPS
100.0000 mg | ORAL_CAPSULE | Freq: Two times a day (BID) | ORAL | Status: DC
  Administered 2019-07-01 – 2019-07-02 (×2): 100 mg via ORAL
  Filled 2019-07-01 (×2): qty 1

## 2019-07-01 MED ORDER — BUPIVACAINE IN DEXTROSE 0.75-8.25 % IT SOLN
INTRATHECAL | Status: DC | PRN
  Administered 2019-07-01: 1.4 mL via INTRATHECAL

## 2019-07-01 MED ORDER — METHOCARBAMOL 500 MG PO TABS: 500.0000 mg | ORAL_TABLET | Freq: Four times a day (QID) | ORAL | Status: DC | PRN

## 2019-07-01 MED ORDER — CLOPIDOGREL BISULFATE 75 MG PO TABS
75.0000 mg | ORAL_TABLET | Freq: Every day | ORAL | Status: DC
  Administered 2019-07-02: 09:00:00 75 mg via ORAL
  Filled 2019-07-01: qty 1

## 2019-07-01 MED ORDER — CEFAZOLIN SODIUM-DEXTROSE 2-4 GM/100ML-% IV SOLN
2.0000 g | INTRAVENOUS | Status: AC
  Administered 2019-07-01: 12:00:00 2 g via INTRAVENOUS
  Filled 2019-07-01: qty 100

## 2019-07-01 MED ORDER — BISACODYL 10 MG RE SUPP: 10.0000 mg | Freq: Every day | RECTAL | Status: DC | PRN

## 2019-07-01 MED ORDER — SODIUM CHLORIDE 0.9 % IR SOLN
Status: DC | PRN
  Administered 2019-07-01: 1000 mL

## 2019-07-01 MED ORDER — ISOSORBIDE MONONITRATE ER 30 MG PO TB24
30.0000 mg | ORAL_TABLET | Freq: Two times a day (BID) | ORAL | Status: DC
  Administered 2019-07-01 – 2019-07-02 (×2): 30 mg via ORAL
  Filled 2019-07-01 (×2): qty 1

## 2019-07-01 MED ORDER — MIDAZOLAM HCL 2 MG/2ML IJ SOLN
1.0000 mg | INTRAMUSCULAR | Status: DC
  Filled 2019-07-01: qty 2

## 2019-07-01 MED ORDER — PROPOFOL 10 MG/ML IV BOLUS
INTRAVENOUS | Status: DC | PRN
  Administered 2019-07-01 (×2): 20 ug via INTRAVENOUS
  Administered 2019-07-01: 30 ug via INTRAVENOUS
  Administered 2019-07-01: 20 ug via INTRAVENOUS

## 2019-07-01 MED ORDER — CEFAZOLIN SODIUM-DEXTROSE 2-4 GM/100ML-% IV SOLN
2.0000 g | Freq: Four times a day (QID) | INTRAVENOUS | Status: AC
  Administered 2019-07-01 – 2019-07-02 (×2): 2 g via INTRAVENOUS
  Filled 2019-07-01 (×2): qty 100

## 2019-07-01 MED ORDER — ROPIVACAINE HCL 5 MG/ML IJ SOLN
INTRAMUSCULAR | Status: DC | PRN
  Administered 2019-07-01: 20 mL via PERINEURAL

## 2019-07-01 MED ORDER — MENTHOL 3 MG MT LOZG: 1.0000 | LOZENGE | OROMUCOSAL | Status: DC | PRN

## 2019-07-01 MED ORDER — GABAPENTIN 300 MG PO CAPS
300.0000 mg | ORAL_CAPSULE | Freq: Three times a day (TID) | ORAL | Status: DC
  Administered 2019-07-01 – 2019-07-02 (×3): 300 mg via ORAL
  Filled 2019-07-01 (×3): qty 1

## 2019-07-01 MED ORDER — PANTOPRAZOLE SODIUM 40 MG PO TBEC
40.0000 mg | DELAYED_RELEASE_TABLET | Freq: Every day | ORAL | Status: DC
  Administered 2019-07-02: 40 mg via ORAL
  Filled 2019-07-01: qty 1

## 2019-07-01 MED ORDER — ONDANSETRON HCL 4 MG/2ML IJ SOLN: 4.0000 mg | Freq: Four times a day (QID) | INTRAMUSCULAR | Status: DC | PRN

## 2019-07-01 MED ORDER — ONDANSETRON HCL 4 MG PO TABS: 4.0000 mg | ORAL_TABLET | Freq: Four times a day (QID) | ORAL | Status: DC | PRN

## 2019-07-01 MED ORDER — SENNOSIDES-DOCUSATE SODIUM 8.6-50 MG PO TABS
1.0000 | ORAL_TABLET | Freq: Every day | ORAL | Status: DC
  Administered 2019-07-01: 1 via ORAL
  Filled 2019-07-01: qty 1

## 2019-07-01 MED ORDER — ACETAMINOPHEN 500 MG PO TABS
1000.0000 mg | ORAL_TABLET | Freq: Four times a day (QID) | ORAL | Status: DC
  Administered 2019-07-01 – 2019-07-02 (×3): 1000 mg via ORAL
  Filled 2019-07-01 (×3): qty 2

## 2019-07-01 MED ORDER — ASPIRIN EC 325 MG PO TBEC
325.0000 mg | DELAYED_RELEASE_TABLET | Freq: Every day | ORAL | Status: DC
  Administered 2019-07-02: 325 mg via ORAL
  Filled 2019-07-01: qty 1

## 2019-07-01 MED ORDER — ACETAMINOPHEN 10 MG/ML IV SOLN
1000.0000 mg | Freq: Four times a day (QID) | INTRAVENOUS | Status: DC
  Administered 2019-07-01: 1000 mg via INTRAVENOUS
  Filled 2019-07-01: qty 100

## 2019-07-01 MED ORDER — 0.9 % SODIUM CHLORIDE (POUR BTL) OPTIME
TOPICAL | Status: DC | PRN
  Administered 2019-07-01: 1000 mL

## 2019-07-01 MED ORDER — METOCLOPRAMIDE HCL 5 MG/ML IJ SOLN: 5.0000 mg | Freq: Three times a day (TID) | INTRAMUSCULAR | Status: DC | PRN

## 2019-07-01 MED ORDER — ONDANSETRON HCL 4 MG/2ML IJ SOLN
INTRAMUSCULAR | Status: AC
  Filled 2019-07-01: qty 2

## 2019-07-01 MED ORDER — MORPHINE SULFATE (PF) 4 MG/ML IV SOLN: 1.0000 mg | INTRAVENOUS | Status: DC | PRN

## 2019-07-01 MED ORDER — TRAMADOL HCL 50 MG PO TABS
50.0000 mg | ORAL_TABLET | Freq: Four times a day (QID) | ORAL | Status: DC | PRN
  Administered 2019-07-01: 50 mg via ORAL
  Filled 2019-07-01: qty 1

## 2019-07-01 MED ORDER — PHENOL 1.4 % MT LIQD: 1.0000 | OROMUCOSAL | Status: DC | PRN

## 2019-07-01 MED ORDER — SODIUM CHLORIDE (PF) 0.9 % IJ SOLN
INTRAMUSCULAR | Status: AC
  Filled 2019-07-01: qty 50

## 2019-07-01 MED ORDER — SODIUM CHLORIDE (PF) 0.9 % IJ SOLN
INTRAMUSCULAR | Status: AC
  Filled 2019-07-01: qty 10

## 2019-07-01 MED ORDER — FLEET ENEMA 7-19 GM/118ML RE ENEM: 1.0000 | ENEMA | Freq: Once | RECTAL | Status: DC | PRN

## 2019-07-01 MED ORDER — METOCLOPRAMIDE HCL 5 MG PO TABS: 5.0000 mg | ORAL_TABLET | Freq: Three times a day (TID) | ORAL | Status: DC | PRN

## 2019-07-01 MED ORDER — TEMAZEPAM 15 MG PO CAPS
30.0000 mg | ORAL_CAPSULE | Freq: Every day | ORAL | Status: DC
  Administered 2019-07-01: 30 mg via ORAL
  Filled 2019-07-01: qty 2

## 2019-07-01 MED ORDER — BUPIVACAINE LIPOSOME 1.3 % IJ SUSP
INTRAMUSCULAR | Status: DC | PRN
  Administered 2019-07-01: 20 mL

## 2019-07-01 MED ORDER — DEXAMETHASONE SODIUM PHOSPHATE 4 MG/ML IJ SOLN
INTRAMUSCULAR | Status: DC | PRN
  Administered 2019-07-01: 4 mg via PERINEURAL

## 2019-07-01 MED ORDER — POVIDONE-IODINE 10 % EX SWAB
2.0000 "application " | Freq: Once | CUTANEOUS | Status: AC
  Administered 2019-07-01: 2 via TOPICAL

## 2019-07-01 MED ORDER — METHOCARBAMOL 500 MG IVPB - SIMPLE MED
500.0000 mg | Freq: Four times a day (QID) | INTRAVENOUS | Status: DC | PRN
  Filled 2019-07-01: qty 50

## 2019-07-01 MED ORDER — DEXAMETHASONE SODIUM PHOSPHATE 10 MG/ML IJ SOLN
10.0000 mg | Freq: Once | INTRAMUSCULAR | Status: AC
  Administered 2019-07-02: 10 mg via INTRAVENOUS
  Filled 2019-07-01: qty 1

## 2019-07-01 MED ORDER — PROPOFOL 500 MG/50ML IV EMUL
INTRAVENOUS | Status: AC
  Filled 2019-07-01: qty 100

## 2019-07-01 MED ORDER — SODIUM CHLORIDE 0.9 % IV SOLN
INTRAVENOUS | Status: DC
  Administered 2019-07-01 – 2019-07-02 (×2): via INTRAVENOUS

## 2019-07-01 MED ORDER — POLYETHYLENE GLYCOL 3350 17 G PO PACK: 17.0000 g | PACK | Freq: Every day | ORAL | Status: DC | PRN

## 2019-07-01 MED ORDER — PROPOFOL 10 MG/ML IV BOLUS
INTRAVENOUS | Status: AC
  Filled 2019-07-01: qty 40

## 2019-07-01 MED ORDER — LACTATED RINGERS IV SOLN
INTRAVENOUS | Status: DC
  Administered 2019-07-01 (×2): via INTRAVENOUS

## 2019-07-01 MED ORDER — PROPOFOL 500 MG/50ML IV EMUL
INTRAVENOUS | Status: DC | PRN
  Administered 2019-07-01: 4 ug/kg/min via INTRAVENOUS

## 2019-07-01 MED ORDER — AMITRIPTYLINE HCL 25 MG PO TABS
25.0000 mg | ORAL_TABLET | Freq: Every day | ORAL | Status: DC
  Administered 2019-07-01: 25 mg via ORAL
  Filled 2019-07-01 (×2): qty 1

## 2019-07-01 MED ORDER — ATORVASTATIN CALCIUM 40 MG PO TABS
40.0000 mg | ORAL_TABLET | Freq: Every day | ORAL | Status: DC
  Administered 2019-07-01: 17:00:00 40 mg via ORAL
  Filled 2019-07-01: qty 1

## 2019-07-01 SURGICAL SUPPLY — 64 items
ATTUNE MED DOME PAT 41 KNEE (Knees) ×1 IMPLANT
ATTUNE MED DOME PAT 41MM KNEE (Knees) ×1 IMPLANT
ATTUNE PS FEM LT SZ 7 CEM KNEE (Femur) ×2 IMPLANT
ATTUNE PSRP INSR SZ7 8 KNEE (Insert) ×1 IMPLANT
ATTUNE PSRP INSR SZ7 8MM KNEE (Insert) ×1 IMPLANT
BAG SPEC THK2 15X12 ZIP CLS (MISCELLANEOUS) ×1
BAG ZIPLOCK 12X15 (MISCELLANEOUS) ×3 IMPLANT
BASE TIBIAL ROT PLAT SZ 7 KNEE (Knees) IMPLANT
BLADE SAG 18X100X1.27 (BLADE) ×3 IMPLANT
BLADE SAW SGTL 11.0X1.19X90.0M (BLADE) ×3 IMPLANT
BLADE SURG SZ10 CARB STEEL (BLADE) ×6 IMPLANT
BNDG ELASTIC 6X5.8 VLCR STR LF (GAUZE/BANDAGES/DRESSINGS) ×3 IMPLANT
BOWL SMART MIX CTS (DISPOSABLE) ×3 IMPLANT
BSPLAT TIB 7 CMNT ROT PLAT STR (Knees) ×1 IMPLANT
CEMENT HV SMART SET (Cement) ×6 IMPLANT
CLOSURE STERI-STRIP 1/2X4 (GAUZE/BANDAGES/DRESSINGS) ×2
CLOSURE WOUND 1/2 X4 (GAUZE/BANDAGES/DRESSINGS) ×2
CLSR STERI-STRIP ANTIMIC 1/2X4 (GAUZE/BANDAGES/DRESSINGS) ×2 IMPLANT
COVER SURGICAL LIGHT HANDLE (MISCELLANEOUS) ×3 IMPLANT
COVER WAND RF STERILE (DRAPES) ×2 IMPLANT
CUFF TOURN SGL QUICK 34 (TOURNIQUET CUFF) ×3
CUFF TRNQT CYL 34X4.125X (TOURNIQUET CUFF) ×1 IMPLANT
DECANTER SPIKE VIAL GLASS SM (MISCELLANEOUS) ×3 IMPLANT
DRAPE U-SHAPE 47X51 STRL (DRAPES) ×3 IMPLANT
DRSG ADAPTIC 3X8 NADH LF (GAUZE/BANDAGES/DRESSINGS) ×3 IMPLANT
DRSG PAD ABDOMINAL 8X10 ST (GAUZE/BANDAGES/DRESSINGS) ×3 IMPLANT
DURAPREP 26ML APPLICATOR (WOUND CARE) ×3 IMPLANT
ELECT REM PT RETURN 15FT ADLT (MISCELLANEOUS) ×3 IMPLANT
EVACUATOR 1/8 PVC DRAIN (DRAIN) ×3 IMPLANT
GAUZE SPONGE 4X4 12PLY STRL (GAUZE/BANDAGES/DRESSINGS) ×3 IMPLANT
GLOVE BIO SURGEON STRL SZ7 (GLOVE) ×3 IMPLANT
GLOVE BIO SURGEON STRL SZ8 (GLOVE) ×3 IMPLANT
GLOVE BIOGEL PI IND STRL 6.5 (GLOVE) ×1 IMPLANT
GLOVE BIOGEL PI IND STRL 7.0 (GLOVE) ×1 IMPLANT
GLOVE BIOGEL PI IND STRL 8 (GLOVE) ×1 IMPLANT
GLOVE BIOGEL PI INDICATOR 6.5 (GLOVE) ×2
GLOVE BIOGEL PI INDICATOR 7.0 (GLOVE) ×2
GLOVE BIOGEL PI INDICATOR 8 (GLOVE) ×2
GLOVE SURG SS PI 6.5 STRL IVOR (GLOVE) ×3 IMPLANT
GOWN STRL REUS W/TWL LRG LVL3 (GOWN DISPOSABLE) ×9 IMPLANT
HANDPIECE INTERPULSE COAX TIP (DISPOSABLE) ×3
HOLDER FOLEY CATH W/STRAP (MISCELLANEOUS) IMPLANT
IMMOBILIZER KNEE 20 (SOFTGOODS) ×3
IMMOBILIZER KNEE 20 THIGH 36 (SOFTGOODS) ×1 IMPLANT
KIT TURNOVER KIT A (KITS) IMPLANT
MANIFOLD NEPTUNE II (INSTRUMENTS) ×3 IMPLANT
NS IRRIG 1000ML POUR BTL (IV SOLUTION) ×3 IMPLANT
PACK TOTAL KNEE CUSTOM (KITS) ×3 IMPLANT
PADDING CAST COTTON 6X4 STRL (CAST SUPPLIES) ×7 IMPLANT
PIN DRILL FIX HALF THREAD (BIT) ×2 IMPLANT
PIN STEINMAN FIXATION KNEE (PIN) ×2 IMPLANT
PROTECTOR NERVE ULNAR (MISCELLANEOUS) ×3 IMPLANT
SET HNDPC FAN SPRY TIP SCT (DISPOSABLE) ×1 IMPLANT
STRIP CLOSURE SKIN 1/2X4 (GAUZE/BANDAGES/DRESSINGS) ×4 IMPLANT
SUT MNCRL AB 4-0 PS2 18 (SUTURE) ×3 IMPLANT
SUT STRATAFIX 0 PDS 27 VIOLET (SUTURE) ×3
SUT VIC AB 2-0 CT1 27 (SUTURE) ×9
SUT VIC AB 2-0 CT1 TAPERPNT 27 (SUTURE) ×3 IMPLANT
SUTURE STRATFX 0 PDS 27 VIOLET (SUTURE) ×1 IMPLANT
TIBIAL BASE ROT PLAT SZ 7 KNEE (Knees) ×3 IMPLANT
TRAY FOLEY MTR SLVR 16FR STAT (SET/KITS/TRAYS/PACK) ×3 IMPLANT
WATER STERILE IRR 1000ML POUR (IV SOLUTION) ×6 IMPLANT
WRAP KNEE MAXI GEL POST OP (GAUZE/BANDAGES/DRESSINGS) ×3 IMPLANT
YANKAUER SUCT BULB TIP 10FT TU (MISCELLANEOUS) ×3 IMPLANT

## 2019-07-01 NOTE — Interval H&P Note (Signed)
History and Physical Interval Note:  07/01/2019 9:25 AM  Robert [name]  has presented today for surgery, with the diagnosis of LEFT knee osteoarthritis.  The various methods of treatment have been discussed with the patient and family. After consideration of risks, benefits and other options for treatment, the patient has consented to  Procedure(s) with comments: LEFT TOTAL KNEE ARTHROPLASTY (Left) - 60min as a surgical intervention.  The patient's history has been reviewed, patient examined, no change in status, stable for surgery.  I have reviewed the patient's chart and labs.  Questions were answered to the patient's satisfaction.     Pilar Plate [name]

## 2019-07-01 NOTE — Anesthesia Procedure Notes (Signed)
Spinal  Patient location during procedure: OR Start time: 07/01/2019 11:48 AM End time: 07/01/2019 11:58 AM Staffing Anesthesiologist: Freddrick March, MD Performed: anesthesiologist  Preanesthetic Checklist Completed: patient identified, surgical consent, pre-op evaluation, timeout performed, IV checked, risks and benefits discussed and monitors and equipment checked Spinal Block Patient position: sitting Prep: site prepped and draped and DuraPrep Patient monitoring: cardiac monitor, continuous pulse ox and blood pressure Approach: midline Location: L3-4 Injection technique: single-shot Needle Needle type: Pencan  Needle gauge: 24 G Needle length: 9 cm Assessment Sensory level: T6 Additional Notes Functioning IV was confirmed and monitors were applied. Sterile prep and drape, including hand hygiene and sterile gloves were used. The patient was positioned and the spine was prepped. The skin was anesthetized with lidocaine.  Free flow of clear CSF was obtained prior to injecting local anesthetic into the CSF.  The spinal needle aspirated freely following injection.  The needle was carefully withdrawn.  The patient tolerated the procedure well.

## 2019-07-01 NOTE — Anesthesia Postprocedure Evaluation (Signed)
Anesthesia Post Note  Patient: Robert [name]  Procedure(s) Performed: LEFT TOTAL KNEE ARTHROPLASTY (Left Knee)     Patient location during evaluation: PACU Anesthesia Type: Spinal and Regional Level of consciousness: oriented and awake and alert Pain management: pain level controlled Vital Signs Assessment: post-procedure vital signs reviewed and stable Respiratory status: spontaneous breathing, respiratory function stable and patient connected to nasal cannula oxygen Cardiovascular status: blood pressure returned to baseline and stable Postop Assessment: no headache, no backache and no apparent nausea or vomiting Anesthetic complications: no    Last Vitals:  Vitals:   07/01/19 1445 07/01/19 1500  BP: (!) 142/89 (!) 149/95  Pulse: (!) 52 (!) 56  Resp: 15 14  Temp:    SpO2: 99% 98%    Last Pain:  Vitals:   07/01/19 1445  TempSrc:   PainSc: 0-No pain                 Bailee Metter L Berlie Persky

## 2019-07-01 NOTE — Discharge Instructions (Signed)
° °Dr. Frank Aluisio °Total Joint Specialist °[facility] °[address] °[place], [place] [address] °[phone] ° °TOTAL KNEE REPLACEMENT POSTOPERATIVE DIRECTIONS ° °Knee Rehabilitation, Guidelines Following Surgery  °Results after knee surgery are often greatly improved when you follow the exercise, range of motion and muscle strengthening exercises prescribed by your doctor. Safety measures are also important to protect the knee from further injury. Any time any of these exercises cause you to have increased pain or swelling in your knee joint, decrease the amount until you are comfortable again and slowly increase them. If you have problems or questions, call your caregiver or physical therapist for advice.  ° °HOME CARE INSTRUCTIONS  °• Remove items at home which could result in a fall. This includes throw rugs or furniture in walking pathways.  °· ICE to the affected knee every three hours for 30 minutes at a time and then as needed for pain and swelling.  Continue to use ice on the knee for pain and swelling from surgery. You may notice swelling that will progress down to the foot and ankle.  This is normal after surgery.  Elevate the leg when you are not up walking on it.   °· Continue to use the breathing machine which will help keep your temperature down.  It is common for your temperature to cycle up and down following surgery, especially at night when you are not up moving around and exerting yourself.  The breathing machine keeps your lungs expanded and your temperature down. °· Do not place pillow under knee, focus on keeping the knee straight while resting ° °DIET °You may resume your previous home diet once your are discharged from the hospital. ° °DRESSING / WOUND CARE / SHOWERING °You may shower 3 days after surgery, but keep the wounds dry during showering.  You may use an occlusive plastic wrap (Press'n Seal for example), NO SOAKING/SUBMERGING IN THE BATHTUB.  If the bandage  gets wet, change with a clean dry gauze.  If the incision gets wet, pat the wound dry with a clean towel. °You may start showering once you are discharged home but do not submerge the incision under water. Just pat the incision dry and apply a dry gauze dressing on daily. °Change the surgical dressing daily and reapply a dry dressing each time. ° °ACTIVITY °Walk with your walker as instructed. °Use walker as long as suggested by your caregivers. °Avoid periods of inactivity such as sitting longer than an hour when not asleep. This helps prevent blood clots.  °You may resume a sexual relationship in one month or when given the OK by your doctor.  °You may return to work once you are cleared by your doctor.  °Do not drive a car for 6 weeks or until released by you surgeon.  °Do not drive while taking narcotics. ° °WEIGHT BEARING °Weight bearing as tolerated with assist device (walker, cane, etc) as directed, use it as long as suggested by your surgeon or therapist, typically at least 4-6 weeks. ° °POSTOPERATIVE CONSTIPATION PROTOCOL °Constipation - defined medically as fewer than three stools per week and severe constipation as less than one stool per week. ° °One of the most common issues patients have following surgery is constipation.  Even if you have a regular bowel pattern at home, your normal regimen is likely to be disrupted due to multiple reasons following surgery.  Combination of anesthesia, postoperative narcotics, change in appetite and fluid intake all can affect your bowels.    In order to avoid complications following surgery, here are some recommendations in order to help you during your recovery period. ° °Colace (docusate) - Pick up an over-the-counter form of Colace or another stool softener and take twice a day as long as you are requiring postoperative pain medications.  Take with a full glass of water daily.  If you experience loose stools or diarrhea, hold the colace until you stool forms back  up.  If your symptoms do not get better within 1 week or if they get worse, check with your doctor. ° °Dulcolax (bisacodyl) - Pick up over-the-counter and take as directed by the product packaging as needed to assist with the movement of your bowels.  Take with a full glass of water.  Use this product as needed if not relieved by Colace only.  ° °MiraLax (polyethylene glycol) - Pick up over-the-counter to have on hand.  MiraLax is a solution that will increase the amount of water in your bowels to assist with bowel movements.  Take as directed and can mix with a glass of water, juice, soda, coffee, or tea.  Take if you go more than two days without a movement. °Do not use MiraLax more than once per day. Call your doctor if you are still constipated or irregular after using this medication for 7 days in a row. ° °If you continue to have problems with postoperative constipation, please contact the office for further assistance and recommendations.  If you experience "the worst abdominal pain ever" or develop nausea or vomiting, please contact the office immediatly for further recommendations for treatment. ° °ITCHING ° If you experience itching with your medications, try taking only a single pain pill, or even half a pain pill at a time.  You can also use Benadryl over the counter for itching or also to help with sleep.  ° °TED HOSE STOCKINGS °Wear the elastic stockings on both legs for three weeks following surgery during the day but you may remove then at night for sleeping. ° °MEDICATIONS °See your medication summary on the “After Visit Summary” that the nursing staff will review with you prior to discharge.  You may have some home medications which will be placed on hold until you complete the course of blood thinner medication.  It is important for you to complete the blood thinner medication as prescribed by your surgeon.  Continue your approved medications as instructed at time of discharge. ° °PRECAUTIONS °If  you experience chest pain or shortness of breath - call 911 immediately for transfer to the hospital emergency department.  °If you develop a fever greater that 101 F, purulent drainage from wound, increased redness or drainage from wound, foul odor from the wound/dressing, or calf pain - CONTACT YOUR SURGEON.   °                                                °FOLLOW-UP APPOINTMENTS °Make sure you keep all of your appointments after your operation with your surgeon and caregivers. You should call the office at the above phone number and make an appointment for approximately two weeks after the date of your surgery or on the date instructed by your surgeon outlined in the "After Visit Summary". ° ° °RANGE OF MOTION AND STRENGTHENING EXERCISES  °Rehabilitation of the knee is important following a knee injury or   an operation. After just a few days of immobilization, the muscles of the thigh which control the knee become weakened and shrink (atrophy). Knee exercises are designed to build up the tone and strength of the thigh muscles and to improve knee motion. Often times heat used for twenty to thirty minutes before working out will loosen up your tissues and help with improving the range of motion but do not use heat for the first two weeks following surgery. These exercises can be done on a training (exercise) mat, on the floor, on a table or on a bed. Use what ever works the best and is most comfortable for you Knee exercises include:  °• Leg Lifts - While your knee is still immobilized in a splint or cast, you can do straight leg raises. Lift the leg to 60 degrees, hold for 3 sec, and slowly lower the leg. Repeat 10-20 times 2-3 times daily. Perform this exercise against resistance later as your knee gets better.  °• Quad and Hamstring Sets - Tighten up the muscle on the front of the thigh (Quad) and hold for 5-10 sec. Repeat this 10-20 times hourly. Hamstring sets are done by pushing the foot backward against an  object and holding for 5-10 sec. Repeat as with quad sets.  °· Leg Slides: Lying on your back, slowly slide your foot toward your buttocks, bending your knee up off the floor (only go as far as is comfortable). Then slowly slide your foot back down until your leg is flat on the floor again. °· Angel Wings: Lying on your back spread your legs to the side as far apart as you can without causing discomfort.  °A rehabilitation program following serious knee injuries can speed recovery and prevent re-injury in the future due to weakened muscles. Contact your doctor or a physical therapist for more information on knee rehabilitation.  ° °IF YOU ARE TRANSFERRED TO A SKILLED REHAB FACILITY °If the patient is transferred to a skilled rehab facility following release from the hospital, a list of the current medications will be sent to the facility for the patient to continue.  When discharged from the skilled rehab facility, please have the facility set up the patient's Home Health Physical Therapy prior to being released. Also, the skilled facility will be responsible for providing the patient with their medications at time of release from the facility to include their pain medication, the muscle relaxants, and their blood thinner medication. If the patient is still at the rehab facility at time of the two week follow up appointment, the skilled rehab facility will also need to assist the patient in arranging follow up appointment in our office and any transportation needs. ° °MAKE SURE YOU:  °• Understand these instructions.  °• Get help right away if you are not doing well or get worse.  ° ° °Pick up stool softner and laxative for home use following surgery while on pain medications. °Do not submerge incision under water. °Please use good hand washing techniques while changing dressing each day. °May shower starting three days after surgery. °Please use a clean towel to pat the incision dry following showers. °Continue to  use ice for pain and swelling after surgery. °Do not use any lotions or creams on the incision until instructed by your surgeon. ° °

## 2019-07-01 NOTE — Evaluation (Signed)
Physical Therapy Evaluation Patient Details Name: Robert [name] MRN: [id number] DOB: 03/16/1946 Today's Date: 07/01/2019   History of Present Illness  s/p L TKA. 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  Pt amb 100' with RW and min guard for safety. Anticipate steady progress in acute setting  Pt will benefit from skilled PT to increase their independence and safety with mobility to allow discharge to the venue listed below.      Follow Up Recommendations Follow surgeon's recommendation for DC plan and follow-up therapies    Equipment Recommendations  None recommended by PT    Recommendations for Other Services       Precautions / Restrictions Precautions Precautions: Knee;Fall Required Braces or Orthoses: Knee Immobilizer - Left Knee Immobilizer - Left: Discontinue once straight leg raise with < 10 degree lag Restrictions Weight Bearing Restrictions: No      Mobility  Bed Mobility Overal bed mobility: Needs Assistance Bed Mobility: Supine to Sit     Supine to sit: Min guard     General bed mobility comments: for safety  Transfers Overall transfer level: Needs assistance Equipment used: Rolling walker (2 wheeled) Transfers: Sit to/from Stand Sit to Stand: Min guard         General transfer comment: cues for hand placement and LLE position  Ambulation/Gait Ambulation/Gait assistance: Min guard;Min assist Gait Distance (Feet): 100 Feet Assistive device: Rolling walker (2 wheeled) Gait Pattern/deviations: Step-to pattern;Decreased stance time - left     General Gait Details: cues for sequence and RW position  Stairs            Wheelchair Mobility    Modified Rankin (Stroke Patients Only)       Balance                                             Pertinent Vitals/Pain Pain Assessment: 0-10 Pain Location: L knee Pain Descriptors /  Indicators: Sore;Guarding Pain Intervention(s): Limited activity within patient's tolerance;Monitored during session;Repositioned;Ice applied    Home Living Family/patient expects to be discharged to:: Private residence Living Arrangements: Spouse/significant other Available Help at Discharge: Family;Available 24 hours/day Type of Home: House Home Access: Stairs to enter Entrance Stairs-Rails: None Entrance Stairs-Number of Steps: 2 and 1 Home Layout: One level Home Equipment: Walker - 2 wheels;Bedside commode;Cane - single point;Shower seat      Prior Function Level of Independence: Independent               Hand Dominance        Extremity/Trunk Assessment   Upper Extremity Assessment Upper Extremity Assessment: Overall WFL for tasks assessed    Lower Extremity Assessment Lower Extremity Assessment: LLE deficits/detail LLE Deficits / Details: ankle WFL; knee and hip grossly 3/5,  knee AAROM ~ 6 to 60 degrees flexion       Communication   Communication: No difficulties  Cognition Arousal/Alertness: Awake/alert Behavior During Therapy: WFL for tasks assessed/performed Overall Cognitive Status: Within Functional Limits for tasks assessed                                        General Comments      Exercises Total Joint Exercises Ankle  Circles/Pumps: AROM;10 reps;Both Quad Sets: AROM;Both;10 reps   Assessment/Plan    PT Assessment Patient needs continued PT services  PT Problem List Decreased strength;Decreased range of motion;Decreased activity tolerance;Decreased mobility;Pain;Decreased knowledge of use of DME       PT Treatment Interventions DME instruction;Therapeutic exercise;Gait training;Stair training;Functional mobility training;Therapeutic activities;Patient/family education    PT Goals (Current goals can be found in the Care Plan section)  Acute Rehab PT Goals PT Goal Formulation: With patient Time For Goal Achievement:  07/08/19 Potential to Achieve Goals: Good    Frequency 7X/week   Barriers to discharge        Co-evaluation               AM-PAC PT "6 Clicks" Mobility  Outcome Measure Help needed turning from your back to your side while in a flat bed without using bedrails?: A Little Help needed moving from lying on your back to sitting on the side of a flat bed without using bedrails?: A Little Help needed moving to and from a bed to a chair (including a wheelchair)?: A Little Help needed standing up from a chair using your arms (e.g., wheelchair or bedside chair)?: A Little Help needed to walk in hospital room?: A Little Help needed climbing 3-5 steps with a railing? : A Lot 6 Click Score: 17    End of Session Equipment Utilized During Treatment: Gait belt Activity Tolerance: Patient tolerated treatment well Patient left: in chair;with call bell/phone within reach;with chair alarm set;with family/visitor present   PT Visit Diagnosis: Difficulty in walking, not elsewhere classified (R26.2)    Time: 1750-1806 PT Time Calculation (min) (ACUTE ONLY): 16 min   Charges:   PT Evaluation $PT Eval Low Complexity: 1 Low          Kenyon Ana, PT  Pager: [phone] Acute Rehab Dept [facility]): [phone]   07/01/2019   [facility] 07/01/2019, 6:20 PM

## 2019-07-01 NOTE — Progress Notes (Signed)
Assisted Dr. Hulan Fray with left, ultrasound guided, adductor canal block. Side rails up, monitors on throughout procedure. See vital signs in flow sheet. Tolerated Procedure well.

## 2019-07-01 NOTE — Anesthesia Procedure Notes (Signed)
Anesthesia Regional Block: Adductor canal block   Pre-Anesthetic Checklist: ,, timeout performed, Correct Patient, Correct Site, Correct Laterality, Correct Procedure, Correct Position, site marked, Risks and benefits discussed,  Surgical consent,  Pre-op evaluation,  At surgeon's request and post-op pain management  Laterality: Left  Prep: Maximum Sterile Barrier Precautions used, chloraprep       Needles:  Injection technique: Single-shot  Needle Type: Echogenic Stimulator Needle     Needle Length: 9cm  Needle Gauge: 22     Additional Needles:   Procedures:,,,, ultrasound used (permanent image in chart),,,,  Narrative:  Start time: 07/01/2019 11:08 AM End time: 07/01/2019 11:18 AM Injection made incrementally with aspirations every 5 mL.  Performed by: Personally  Anesthesiologist: Freddrick March, MD  Additional Notes: Monitors applied. No increased pain on injection. No increased resistance to injection. Injection made in 5cc increments. Good needle visualization. Patient tolerated procedure well.

## 2019-07-01 NOTE — Op Note (Signed)
OPERATIVE REPORT-TOTAL KNEE ARTHROPLASTY   Pre-operative diagnosis- Osteoarthritis  Left knee(s)  Post-operative diagnosis- Osteoarthritis Left knee(s)  Procedure-  Left  Total Knee Arthroplasty  Surgeon- Dione Plover. Talaya Lamprecht, MD  Assistant- Ardeen Jourdain, PA-C   Anesthesia-  Adductor canal block and spinal  EBL-100 mL   Drains Hemovac  Tourniquet time- 39 minutes @300  mm Hg  Complications- None  Condition-PACU - hemodynamically stable.   Brief Clinical Note   Robert [name] is a 73 y.o. year old male with end stage OA of his left knee with progressively worsening pain and dysfunction. He has constant pain, with activity and at rest and significant functional deficits with difficulties even with ADLs. He has had extensive non-op management including analgesics, injections of cortisone and viscosupplements, and home exercise program, but remains in significant pain with significant dysfunction. Radiographs show bone on bone arthritis medial and patellofemoral. He presents now for left Total Knee Arthroplasty.    Procedure in detail---   The patient is brought into the operating room and positioned supine on the operating table. After successful administration of  Adductor canal block and spinal,   a tourniquet is placed high on the  Left thigh(s) and the lower extremity is prepped and draped in the usual sterile fashion. Time out is performed by the operating team and then the  Left lower extremity is wrapped in Esmarch, knee flexed and the tourniquet inflated to 300 mmHg.       A midline incision is made with a ten blade through the subcutaneous tissue to the level of the extensor mechanism. A fresh blade is used to make a medial parapatellar arthrotomy. Soft tissue over the proximal medial tibia is subperiosteally elevated to the joint line with a knife and into the semimembranosus bursa with a Cobb elevator. Soft tissue over the proximal lateral tibia is elevated with attention  being paid to avoiding the patellar tendon on the tibial tubercle. The patella is everted, knee flexed 90 degrees and the ACL and PCL are removed. Findings are bone on bone medial and patellofemoral with large global osteophytes.        The drill is used to create a starting hole in the distal femur and the canal is thoroughly irrigated with sterile saline to remove the fatty contents. The 5 degree Left  valgus alignment guide is placed into the femoral canal and the distal femoral cutting block is pinned to remove 9 mm off the distal femur. Resection is made with an oscillating saw.      The tibia is subluxed forward and the menisci are removed. The extramedullary alignment guide is placed referencing proximally at the medial aspect of the tibial tubercle and distally along the second metatarsal axis and tibial crest. The block is pinned to remove 27mm off the more deficient medial  side. Resection is made with an oscillating saw. Size 7is the most appropriate size for the tibia and the proximal tibia is prepared with the modular drill and keel punch for that size.      The femoral sizing guide is placed and size 7 is most appropriate. Rotation is marked off the epicondylar axis and confirmed by creating a rectangular flexion gap at 90 degrees. The size 7 cutting block is pinned in this rotation and the anterior, posterior and chamfer cuts are made with the oscillating saw. The intercondylar block is then placed and that cut is made.      Trial size 7 tibial component, trial size 7 posterior  stabilized femur and a 8  mm posterior stabilized rotating platform insert trial is placed. Full extension is achieved with excellent varus/valgus and anterior/posterior balance throughout full range of motion. The patella is everted and thickness measured to be 27  mm. Free hand resection is taken to 15 mm, a 41 template is placed, lug holes are drilled, trial patella is placed, and it tracks normally. Osteophytes are  removed off the posterior femur with the trial in place. All trials are removed and the cut bone surfaces prepared with pulsatile lavage. Cement is mixed and once ready for implantation, the size 7 tibial implant, size  7 posterior stabilized femoral component, and the size 41 patella are cemented in place and the patella is held with the clamp. The trial insert is placed and the knee held in full extension. The Exparel (20 ml mixed with 60 ml saline) is injected into the extensor mechanism, posterior capsule, medial and lateral gutters and subcutaneous tissues.  All extruded cement is removed and once the cement is hard the permanent 8 mm posterior stabilized rotating platform insert is placed into the tibial tray.      The wound is copiously irrigated with saline solution and the extensor mechanism closed over a hemovac drain with #1 V-loc suture. The tourniquet is released for a total tourniquet time of 39  minutes. Flexion against gravity is 140 degrees and the patella tracks normally. Subcutaneous tissue is closed with 2.0 vicryl and subcuticular with running 4.0 Monocryl. The incision is cleaned and dried and steri-strips and a bulky sterile dressing are applied. The limb is placed into a knee immobilizer and the patient is awakened and transported to recovery in stable condition.      Please note that a surgical assistant was a medical necessity for this procedure in order to perform it in a safe and expeditious manner. Surgical assistant was necessary to retract the ligaments and vital neurovascular structures to prevent injury to them and also necessary for proper positioning of the limb to allow for anatomic placement of the prosthesis.   Dione Plover Leyna Vanderkolk, MD    07/01/2019, 1:08 PM

## 2019-07-01 NOTE — Transfer of Care (Signed)
Immediate Anesthesia Transfer of Care Note  Patient: Robert [name]  Procedure(s) Performed: LEFT TOTAL KNEE ARTHROPLASTY (Left Knee)  Patient Location: PACU  Anesthesia Type:MAC, Regional and Spinal  Level of Consciousness: awake and alert   Airway & Oxygen Therapy: Patient Spontanous Breathing and Patient connected to nasal cannula oxygen  Post-op Assessment: Report given to RN and Post -op Vital signs reviewed and stable  Post vital signs: Reviewed and stable  Last Vitals:  Vitals Value Taken Time  BP 119/95 07/01/19 1322  Temp    Pulse 66 07/01/19 1323  Resp    SpO2 99 % 07/01/19 1323  Vitals shown include unvalidated device data.  Last Pain:  Vitals:   07/01/19 1128  TempSrc:   PainSc: 0-No pain      Patients Stated Pain Goal: 3 (40/81/44 [id number])  Complications: No apparent anesthesia complications

## 2019-07-02 ENCOUNTER — Encounter (HOSPITAL_COMMUNITY): Payer: Self-pay | Admitting: Orthopedic Surgery

## 2019-07-02 DIAGNOSIS — I25119 Atherosclerotic heart disease of native coronary artery with unspecified angina pectoris: Secondary | ICD-10-CM | POA: Diagnosis not present

## 2019-07-02 DIAGNOSIS — M1712 Unilateral primary osteoarthritis, left knee: Secondary | ICD-10-CM | POA: Diagnosis not present

## 2019-07-02 DIAGNOSIS — M48062 Spinal stenosis, lumbar region with neurogenic claudication: Secondary | ICD-10-CM | POA: Diagnosis not present

## 2019-07-02 DIAGNOSIS — I110 Hypertensive heart disease with heart failure: Secondary | ICD-10-CM | POA: Diagnosis not present

## 2019-07-02 DIAGNOSIS — G4733 Obstructive sleep apnea (adult) (pediatric): Secondary | ICD-10-CM | POA: Diagnosis not present

## 2019-07-02 DIAGNOSIS — R21 Rash and other nonspecific skin eruption: Secondary | ICD-10-CM | POA: Diagnosis not present

## 2019-07-02 DIAGNOSIS — H9193 Unspecified hearing loss, bilateral: Secondary | ICD-10-CM | POA: Diagnosis not present

## 2019-07-02 DIAGNOSIS — Z955 Presence of coronary angioplasty implant and graft: Secondary | ICD-10-CM | POA: Diagnosis not present

## 2019-07-02 DIAGNOSIS — I509 Heart failure, unspecified: Secondary | ICD-10-CM | POA: Diagnosis not present

## 2019-07-02 LAB — BASIC METABOLIC PANEL
Anion gap: 7 (ref 5–15)
BUN: 19 mg/dL (ref 8–23)
CO2: 24 mmol/L (ref 22–32)
Calcium: 8.3 mg/dL — ABNORMAL LOW (ref 8.9–10.3)
Chloride: 105 mmol/L (ref 98–111)
Creatinine, Ser: 1.02 mg/dL (ref 0.61–1.24)
GFR calc Af Amer: >60 mL/min (ref >60)
GFR calc non Af Amer: >60 mL/min (ref >60)
Glucose, Bld: 140 mg/dL — ABNORMAL HIGH (ref 70–99)
Potassium: 4.8 mmol/L (ref 3.5–5.1)
Sodium: 136 mmol/L (ref 135–145)

## 2019-07-02 LAB — CBC
HCT: 45 % (ref 39.0–52.0)
Hemoglobin: 13.6 g/dL (ref 13.0–17.0)
MCH: 24.5 pg — ABNORMAL LOW (ref 26.0–34.0)
MCHC: 30.2 g/dL (ref 30.0–36.0)
MCV: 80.9 fL (ref 80.0–100.0)
Platelets: 218 10*3/uL (ref 150–400)
RBC: 5.56 MIL/uL (ref 4.22–5.81)
RDW: 15.5 % (ref 11.5–15.5)
WBC: 15.1 10*3/uL — ABNORMAL HIGH (ref 4.0–10.5)
nRBC: 0 % (ref 0.0–0.2)

## 2019-07-02 MED ORDER — TRAMADOL HCL 50 MG PO TABS: 50.0000 mg | ORAL_TABLET | Freq: Four times a day (QID) | ORAL | 0 refills | Status: DC | PRN

## 2019-07-02 MED ORDER — ASPIRIN 325 MG PO TBEC: 325.0000 mg | DELAYED_RELEASE_TABLET | Freq: Every day | ORAL | 0 refills | Status: AC

## 2019-07-02 MED ORDER — METHOCARBAMOL 500 MG PO TABS: 500.0000 mg | ORAL_TABLET | Freq: Four times a day (QID) | ORAL | 0 refills | Status: DC | PRN

## 2019-07-02 MED ORDER — OXYCODONE HCL 5 MG PO TABS: 5.0000 mg | ORAL_TABLET | Freq: Four times a day (QID) | ORAL | 0 refills | Status: DC | PRN

## 2019-07-02 MED ORDER — GABAPENTIN 300 MG PO CAPS: 300.0000 mg | ORAL_CAPSULE | Freq: Three times a day (TID) | ORAL | 0 refills | Status: DC

## 2019-07-02 NOTE — Progress Notes (Signed)
Subjective: 1 Day Post-Op Procedure(s) (LRB): LEFT TOTAL KNEE ARTHROPLASTY (Left) Patient reports pain as mild.   Patient seen in rounds by Dr. Wynelle Link. Patient is well, and has had no acute complaints or problems other than discomfort in the left knee. No acute events overnight. Patient ambulated 100 feet with PT yesterday. Foley catheter removed, positive flatus. Denies CP, SHOB.  We will continue therapy today.   Objective: Vital signs in last 24 hours: Temp:  [97.5 F (36.4 C)-99 F (37.2 C)] 98.3 F (36.8 C) (10/13 0513) Pulse Rate:  [52-67] 67 (10/13 0513) Resp:  [12-20] 17 (10/13 0513) BP: (102-158)/(69-99) 102/72 (10/13 0513) SpO2:  [96 %-100 %] 96 % (10/13 0513) Weight:  [id number] kg] 112 kg (10/12 0943)  Intake/Output from previous day:  Intake/Output Summary (Last 24 hours) at 07/02/2019 0708 Last data filed at 07/02/2019 [id number] Gross per 24 hour  Intake 2243.84 ml  Output 2082.5 ml  Net 161.34 ml     Intake/Output this shift: No intake/output data recorded.  Labs: Recent Labs    07/02/19 0342  HGB 13.6   Recent Labs    07/02/19 0342  WBC 15.1*  RBC 5.56  HCT 45.0  PLT 218   Recent Labs    07/02/19 0342  NA 136  K 4.8  CL 105  CO2 24  BUN 19  CREATININE 1.02  GLUCOSE 140*  CALCIUM 8.3*   No results for input(s): LABPT, INR in the last 72 hours.  Exam: General - Patient is Alert and Oriented Extremity - Neurologically intact Sensation intact distally Intact pulses distally Dorsiflexion/Plantar flexion intact Dressing - dressing C/D/I Motor Function - intact, moving foot and toes well on exam.   Past Medical History:  Diagnosis Date  . ALLERGIC RHINITIS 08/20/2007  . Anemia   . Arthritis    "back; knees; shoulders"  (06/08/2017)  . Asthma    "very slight"   . Barrett esophagus   . BENIGN PROSTATIC HYPERTROPHY 08/20/2007  . Coronary artery disease involving native heart with angina pectoris ([place])    a. s/p CABG x 3 in 1989;  b. MI in  1995 with BMS to VG->RCA;  c. 07/2000 PCI/BMS to native LCX;  d. Known CTO of VG->Diag;  e. Mult caths w/ stable anatomy;  f. 04/2013 Cath: LM nl, ostLAD 100, LCX 10%isr, 24m, OM1/2/3 min irregs, RI min irregs, pRCA 80-90%/ 100& mid, VG->RCA ostial Stent w/ 20-30isr, LIMA->LAD ok, Occluded VG-Diag. EF 60%-->Med Rx.  . ELBOW PAIN, LEFT 04/15/2008  . ERECTILE DYSFUNCTION 08/20/2007  . Gallbladder polyp 08/27/2015   Noted on [place] Abd  . GERD (gastroesophageal reflux disease)   . Hepatic cyst 09/06/2015   Large simple right hepatic lobe, noted on [place] Abd  . History of cardiomegaly 05/15/2013   Noted on CXR  . History of meningitis    2nd grade, 8th grade, 9th grade, age 3  . HYPERLIPIDEMIA TYPE I / IV 09/03/2008  . HYPERTENSION    "been on RX since CABG but never a problem" (06/08/2017)  . INSOMNIA-SLEEP DISORDER-UNSPEC 03/17/2008  . Microcytosis   . Myocardial infarction [facility]) June 1995  . OSA on CPAP   . PREMATURE VENTRICULAR CONTRACTIONS 08/20/2007  . SCROTAL MASS 10/09/2008  . URINARY INCONTINENCE, MALE 10/09/2008  . UTI 04/24/2009   "just this once" (05/15/2013)    Assessment/Plan: 1 Day Post-Op Procedure(s) (LRB): LEFT TOTAL KNEE ARTHROPLASTY (Left) Principal Problem:   OA (osteoarthritis) of knee  Estimated body mass index is 33.5 kg/m as  calculated from the following:   Height as of this encounter: 6' (1.829 m).   Weight as of this encounter: 112 kg. Advance diet Up with therapy D/C IV fluids   Patient's anticipated LOS is less than 2 midnights, meeting these requirements: - Lives within 1 hour of care - Has a competent adult at home to recover with post-op recover - NO history of  - Chronic pain requiring opiods  - Diabetes  - Coronary Artery Disease  - Heart failure  - Stroke  - DVT/VTE  - Cardiac arrhythmia  - Respiratory Failure/COPD  - Renal failure  - Anemia  - Advanced Liver disease  DVT Prophylaxis - Aspirin and Plavix Weight bearing as tolerated. D/C O2 and  pulse ox and try on room air. Hemovac pulled without difficulty, will begin therapy today.  Plan is to go Home after hospital stay. Plan for discharge today following 1-2 sessions of therapy as long as he is meeting his goals. Scheduled for OPPT at [facility]. Follow up in the office in 2 weeks.   Griffith Citron, PA-C Orthopedic Surgery [phone] 07/02/2019, 7:08 AM

## 2019-07-02 NOTE — Progress Notes (Signed)
Physical Therapy Treatment Patient Details Name: Robert [name] MRN: [id number] DOB: October 24, 1945 Today's Date: 07/02/2019    History of Present Illness s/p L TKA. PMH: lum lam L4-5,  CABG    PT Comments    Pt progressing well; will see for a second session for stair training. Should be ready to d/c this pm   Follow Up Recommendations  Follow surgeon's recommendation for DC plan and follow-up therapies     Equipment Recommendations  None recommended by PT    Recommendations for Other Services       Precautions / Restrictions Precautions Precautions: Knee;Fall Precaution Comments: IND SLRs, reviewed use of KI when side sleeping if incr flexion/difficulty with extension in mornings Required Braces or Orthoses: Knee Immobilizer - Left Knee Immobilizer - Left: Discontinue once straight leg raise with < 10 degree lag Restrictions Weight Bearing Restrictions: No    Mobility  Bed Mobility Overal bed mobility: Needs Assistance Bed Mobility: Supine to Sit;Sit to Supine     Supine to sit: Supervision Sit to supine: Supervision   General bed mobility comments: for safety  Transfers Overall transfer level: Needs assistance Equipment used: Rolling walker (2 wheeled) Transfers: Sit to/from Stand Sit to Stand: Min guard         General transfer comment: cues for hand placement and LLE position  Ambulation/Gait Ambulation/Gait assistance: Min guard;Supervision Gait Distance (Feet): 160 Feet Assistive device: Rolling walker (2 wheeled) Gait Pattern/deviations: Step-to pattern;Decreased stance time - left     General Gait Details: cues for sequence and RW position   Stairs             Wheelchair Mobility    Modified Rankin (Stroke Patients Only)       Balance                                            Cognition Arousal/Alertness: Awake/alert Behavior During Therapy: WFL for tasks assessed/performed Overall Cognitive Status:  Within Functional Limits for tasks assessed                                        Exercises Total Joint Exercises Ankle Circles/Pumps: AROM;10 reps;Both Quad Sets: AROM;Both;10 reps Short Arc Quad: AROM;Left;10 reps Heel Slides: AAROM;Left;10 reps Hip ABduction/ADduction: AROM;Left;10 reps Straight Leg Raises: AROM;AAROM;Left;10 reps Goniometric ROM: grossly 5 to 75 degrees flexion AAROM    General Comments        Pertinent Vitals/Pain Pain Assessment: 0-10 Pain Score: 3  Pain Location: L knee Pain Descriptors / Indicators: Sore;Guarding Pain Intervention(s): Limited activity within patient's tolerance;Monitored during session;Premedicated before session;Repositioned;Ice applied    Home Living                      Prior Function            PT Goals (current goals can now be found in the care plan section) Acute Rehab PT Goals PT Goal Formulation: With patient Time For Goal Achievement: 07/08/19 Potential to Achieve Goals: Good Progress towards PT goals: Progressing toward goals    Frequency    7X/week      PT Plan Current plan remains appropriate    Co-evaluation              AM-PAC PT "6 Clicks" Mobility  Outcome Measure  Help needed turning from your back to your side while in a flat bed without using bedrails?: A Little Help needed moving from lying on your back to sitting on the side of a flat bed without using bedrails?: A Little Help needed moving to and from a bed to a chair (including a wheelchair)?: A Little Help needed standing up from a chair using your arms (e.g., wheelchair or bedside chair)?: A Little Help needed to walk in hospital room?: A Little Help needed climbing 3-5 steps with a railing? : A Little 6 Click Score: 18    End of Session Equipment Utilized During Treatment: Gait belt Activity Tolerance: Patient tolerated treatment well Patient left: in bed;with call bell/phone within reach;with bed alarm  set   PT Visit Diagnosis: Difficulty in walking, not elsewhere classified (R26.2)     Time: [id number] PT Time Calculation (min) (ACUTE ONLY): 32 min  Charges:  $Gait Training: 8-22 mins $Therapeutic Exercise: 8-22 mins                     Kenyon Ana, PT  Pager: [phone] Acute Rehab Dept [facility]): [phone]   07/02/2019    [facility] 07/02/2019, 11:21 AM

## 2019-07-02 NOTE — Progress Notes (Signed)
   07/02/19 1457  PT Visit Information  Last PT Received On 07/02/19 Pt continues to make excellent progress, assisted with dressing per pt request to prepare for d/c(min assist); reviewed stairs. Ready for d/c from PT standpoint.   Assistance Needed +1  History of Present Illness s/p L TKA. PMH: lum lam L4-5,  CABG  Precautions  Precautions Knee;Fall  Precaution Comments IND SLRs, reviewed use of KI when side sleeping if incr flexion/difficulty with extension in mornings  Required Braces or Orthoses Knee Immobilizer - Left  Knee Immobilizer - Left Discontinue once straight leg raise with < 10 degree lag  Restrictions  Weight Bearing Restrictions No  Pain Assessment  Pain Assessment 0-10  Pain Score 3  Pain Location L knee  Pain Descriptors / Indicators Sore;Guarding  Pain Intervention(s) Limited activity within patient's tolerance;Monitored during session;Repositioned  Cognition  Arousal/Alertness Awake/alert  Behavior During Therapy WFL for tasks assessed/performed  Overall Cognitive Status Within Functional Limits for tasks assessed  Bed Mobility  Overal bed mobility Needs Assistance  Bed Mobility Supine to Sit;Sit to Supine  Supine to sit Supervision  General bed mobility comments for safety  Transfers  Overall transfer level Needs assistance  Equipment used Rolling walker (2 wheeled)  Transfers Sit to/from Stand  Sit to Stand Supervision  General transfer comment cues for hand placement and LLE position  Ambulation/Gait  Ambulation/Gait assistance Supervision  Gait Distance (Feet) 160 Feet  Assistive device Rolling walker (2 wheeled)  Gait Pattern/deviations Step-to pattern;Decreased stance time - left  General Gait Details improved fluidity of gait, cues for progression, heel to toe/step through; steady gait-no LOB with support of RW  Stairs Yes  Stairs assistance Min assist;Min guard  Stair Management No rails;Step to pattern;Backwards;With walker  Number of Stairs 2   General stair comments cues for sequence and technique  PT - End of Session  Equipment Utilized During Treatment Gait belt  Activity Tolerance Patient tolerated treatment well  Patient left in bed;with call bell/phone within reach;with bed alarm set   PT - Assessment/Plan  PT Plan Current plan remains appropriate  PT Visit Diagnosis Difficulty in walking, not elsewhere classified (R26.2)  PT Frequency (ACUTE ONLY) 7X/week  Follow Up Recommendations Follow surgeon's recommendation for DC plan and follow-up therapies  PT equipment None recommended by PT  AM-PAC PT "6 Clicks" Mobility Outcome Measure (Version 2)  Help needed turning from your back to your side while in a flat bed without using bedrails? 3  Help needed moving from lying on your back to sitting on the side of a flat bed without using bedrails? 3  Help needed moving to and from a bed to a chair (including a wheelchair)? 3  Help needed standing up from a chair using your arms (e.g., wheelchair or bedside chair)? 3  Help needed to walk in hospital room? 3  Help needed climbing 3-5 steps with a railing?  3  6 Click Score 18  Consider Recommendation of Discharge To: Home with [facility]  PT Goal Progression  Progress towards PT goals Progressing toward goals  Acute Rehab PT Goals  PT Goal Formulation With patient  Time For Goal Achievement 07/08/19  Potential to Achieve Goals Good  PT Time Calculation  PT Start Time (ACUTE ONLY) 1426  PT Stop Time (ACUTE ONLY) 1449  PT Time Calculation (min) (ACUTE ONLY) 23 min  PT General Charges  $$ ACUTE PT VISIT 1 Visit  PT Treatments  $Gait Training 23-37 mins

## 2019-07-04 LAB — TYPE AND SCREEN
ABO/RH(D): A POS
Antibody Screen: POSITIVE
Unit division: 0
Unit division: 0

## 2019-07-04 LAB — BPAM RBC
Blood Product Expiration Date: 202011112359
Blood Product Expiration Date: 202011112359
Unit Type and Rh: 6200
Unit Type and Rh: 6200

## 2019-07-05 DIAGNOSIS — M25562 Pain in left knee: Secondary | ICD-10-CM | POA: Diagnosis not present

## 2019-07-05 DIAGNOSIS — M25662 Stiffness of left knee, not elsewhere classified: Secondary | ICD-10-CM | POA: Diagnosis not present

## 2019-07-09 DIAGNOSIS — Z471 Aftercare following joint replacement surgery: Secondary | ICD-10-CM | POA: Diagnosis not present

## 2019-07-09 DIAGNOSIS — Z96652 Presence of left artificial knee joint: Secondary | ICD-10-CM | POA: Diagnosis not present

## 2019-07-12 DIAGNOSIS — M25662 Stiffness of left knee, not elsewhere classified: Secondary | ICD-10-CM | POA: Diagnosis not present

## 2019-07-12 DIAGNOSIS — M25562 Pain in left knee: Secondary | ICD-10-CM | POA: Diagnosis not present

## 2019-07-15 DIAGNOSIS — M25562 Pain in left knee: Secondary | ICD-10-CM | POA: Diagnosis not present

## 2019-07-15 DIAGNOSIS — M25662 Stiffness of left knee, not elsewhere classified: Secondary | ICD-10-CM | POA: Diagnosis not present

## 2019-07-18 DIAGNOSIS — M25562 Pain in left knee: Secondary | ICD-10-CM | POA: Diagnosis not present

## 2019-07-18 DIAGNOSIS — M25662 Stiffness of left knee, not elsewhere classified: Secondary | ICD-10-CM | POA: Diagnosis not present

## 2019-07-22 DIAGNOSIS — M25562 Pain in left knee: Secondary | ICD-10-CM | POA: Diagnosis not present

## 2019-07-22 DIAGNOSIS — M25662 Stiffness of left knee, not elsewhere classified: Secondary | ICD-10-CM | POA: Diagnosis not present

## 2019-07-24 DIAGNOSIS — M25562 Pain in left knee: Secondary | ICD-10-CM | POA: Diagnosis not present

## 2019-07-24 DIAGNOSIS — M25662 Stiffness of left knee, not elsewhere classified: Secondary | ICD-10-CM | POA: Diagnosis not present

## 2019-07-26 ENCOUNTER — Other Ambulatory Visit: Payer: Self-pay | Admitting: Physician Assistant

## 2019-07-29 DIAGNOSIS — M25662 Stiffness of left knee, not elsewhere classified: Secondary | ICD-10-CM | POA: Diagnosis not present

## 2019-07-29 DIAGNOSIS — M25562 Pain in left knee: Secondary | ICD-10-CM | POA: Diagnosis not present

## 2019-08-01 DIAGNOSIS — M25662 Stiffness of left knee, not elsewhere classified: Secondary | ICD-10-CM | POA: Diagnosis not present

## 2019-08-01 DIAGNOSIS — M25562 Pain in left knee: Secondary | ICD-10-CM | POA: Diagnosis not present

## 2019-08-06 DIAGNOSIS — Z96652 Presence of left artificial knee joint: Secondary | ICD-10-CM | POA: Diagnosis not present

## 2019-08-06 DIAGNOSIS — Z471 Aftercare following joint replacement surgery: Secondary | ICD-10-CM | POA: Diagnosis not present

## 2019-08-09 DIAGNOSIS — M25662 Stiffness of left knee, not elsewhere classified: Secondary | ICD-10-CM | POA: Diagnosis not present

## 2019-08-09 DIAGNOSIS — M25562 Pain in left knee: Secondary | ICD-10-CM | POA: Diagnosis not present

## 2019-08-13 DIAGNOSIS — M25562 Pain in left knee: Secondary | ICD-10-CM | POA: Diagnosis not present

## 2019-08-13 DIAGNOSIS — M25662 Stiffness of left knee, not elsewhere classified: Secondary | ICD-10-CM | POA: Diagnosis not present

## 2019-08-20 DIAGNOSIS — M25562 Pain in left knee: Secondary | ICD-10-CM | POA: Diagnosis not present

## 2019-08-20 DIAGNOSIS — M25662 Stiffness of left knee, not elsewhere classified: Secondary | ICD-10-CM | POA: Diagnosis not present

## 2019-08-22 DIAGNOSIS — M25562 Pain in left knee: Secondary | ICD-10-CM | POA: Diagnosis not present

## 2019-08-22 DIAGNOSIS — M25662 Stiffness of left knee, not elsewhere classified: Secondary | ICD-10-CM | POA: Diagnosis not present

## 2019-08-27 DIAGNOSIS — M25662 Stiffness of left knee, not elsewhere classified: Secondary | ICD-10-CM | POA: Diagnosis not present

## 2019-08-27 DIAGNOSIS — M25562 Pain in left knee: Secondary | ICD-10-CM | POA: Diagnosis not present

## 2019-08-29 ENCOUNTER — Ambulatory Visit (INDEPENDENT_AMBULATORY_CARE_PROVIDER_SITE_OTHER): Payer: Medicare HMO | Admitting: Internal Medicine

## 2019-08-29 ENCOUNTER — Other Ambulatory Visit: Payer: Self-pay

## 2019-08-29 ENCOUNTER — Other Ambulatory Visit (INDEPENDENT_AMBULATORY_CARE_PROVIDER_SITE_OTHER): Payer: Medicare HMO

## 2019-08-29 ENCOUNTER — Encounter: Payer: Self-pay | Admitting: Internal Medicine

## 2019-08-29 ENCOUNTER — Other Ambulatory Visit: Payer: Self-pay | Admitting: Internal Medicine

## 2019-08-29 VITALS — BP 118/78 | HR 60 | Temp 98.5°F | Ht 72.0 in | Wt 236.0 lb

## 2019-08-29 DIAGNOSIS — E559 Vitamin D deficiency, unspecified: Secondary | ICD-10-CM

## 2019-08-29 DIAGNOSIS — R7302 Impaired glucose tolerance (oral): Secondary | ICD-10-CM

## 2019-08-29 DIAGNOSIS — E611 Iron deficiency: Secondary | ICD-10-CM

## 2019-08-29 DIAGNOSIS — E538 Deficiency of other specified B group vitamins: Secondary | ICD-10-CM | POA: Diagnosis not present

## 2019-08-29 DIAGNOSIS — Z0000 Encounter for general adult medical examination without abnormal findings: Secondary | ICD-10-CM | POA: Diagnosis not present

## 2019-08-29 LAB — BASIC METABOLIC PANEL
BUN: 19 mg/dL (ref 6–23)
CO2: 27 mEq/L (ref 19–32)
Calcium: 9.3 mg/dL (ref 8.4–10.5)
Chloride: 104 mEq/L (ref 96–112)
Creatinine, Ser: 0.95 mg/dL (ref 0.40–1.50)
GFR: 77.7 mL/min (ref >60.00)
Glucose, Bld: 103 mg/dL — ABNORMAL HIGH (ref 70–99)
Potassium: 4.8 mEq/L (ref 3.5–5.1)
Sodium: 139 mEq/L (ref 135–145)

## 2019-08-29 LAB — CBC WITH DIFFERENTIAL/PLATELET
Basophils Absolute: 0.1 10*3/uL (ref 0.0–0.1)
Basophils Relative: 1 % (ref 0.0–3.0)
Eosinophils Absolute: 0.2 10*3/uL (ref 0.0–0.7)
Eosinophils Relative: 2.6 % (ref 0.0–5.0)
HCT: 45.2 % (ref 39.0–52.0)
Hemoglobin: 14.6 g/dL (ref 13.0–17.0)
Lymphocytes Relative: 24.3 % (ref 12.0–46.0)
Lymphs Abs: 1.9 10*3/uL (ref 0.7–4.0)
MCHC: 32.2 g/dL (ref 30.0–36.0)
MCV: 76.4 fl — ABNORMAL LOW (ref 78.0–100.0)
Monocytes Absolute: 0.8 10*3/uL (ref 0.1–1.0)
Monocytes Relative: 10.5 % (ref 3.0–12.0)
Neutro Abs: 4.8 10*3/uL (ref 1.4–7.7)
Neutrophils Relative %: 61.6 % (ref 43.0–77.0)
Platelets: 257 10*3/uL (ref 150.0–400.0)
RBC: 5.92 Mil/uL — ABNORMAL HIGH (ref 4.22–5.81)
RDW: 16.4 % — ABNORMAL HIGH (ref 11.5–15.5)
WBC: 7.8 10*3/uL (ref 4.0–10.5)

## 2019-08-29 LAB — HEPATIC FUNCTION PANEL
ALT: 17 U/L (ref 0–53)
AST: 19 U/L (ref 0–37)
Albumin: 4.2 g/dL (ref 3.5–5.2)
Alkaline Phosphatase: 135 U/L — ABNORMAL HIGH (ref 39–117)
Bilirubin, Direct: 0.1 mg/dL (ref 0.0–0.3)
Total Bilirubin: 0.6 mg/dL (ref 0.2–1.2)
Total Protein: 7 g/dL (ref 6.0–8.3)

## 2019-08-29 LAB — IBC PANEL
Iron: 54 ug/dL (ref 42–165)
Saturation Ratios: 12.7 % — ABNORMAL LOW (ref 20.0–50.0)
Transferrin: 303 mg/dL (ref 212.0–360.0)

## 2019-08-29 LAB — URINALYSIS, ROUTINE W REFLEX MICROSCOPIC
Bilirubin Urine: NEGATIVE
Hgb urine dipstick: NEGATIVE
Ketones, ur: NEGATIVE
Leukocytes,Ua: NEGATIVE
Nitrite: NEGATIVE
Specific Gravity, Urine: 1.015 (ref 1.000–1.030)
Total Protein, Urine: NEGATIVE
Urine Glucose: NEGATIVE
Urobilinogen, UA: 1 (ref 0.0–1.0)
pH: 7 (ref 5.0–8.0)

## 2019-08-29 LAB — LIPID PANEL
Cholesterol: 121 mg/dL (ref 0–200)
HDL: 34.7 mg/dL — ABNORMAL LOW (ref >39.00)
LDL Cholesterol: 70 mg/dL (ref 0–99)
NonHDL: 86.58
Total CHOL/HDL Ratio: 3
Triglycerides: 81 mg/dL (ref 0.0–149.0)
VLDL: 16.2 mg/dL (ref 0.0–40.0)

## 2019-08-29 LAB — VITAMIN D 25 HYDROXY (VIT D DEFICIENCY, FRACTURES): VITD: 26.64 ng/mL — ABNORMAL LOW (ref 30.00–100.00)

## 2019-08-29 LAB — VITAMIN B12: Vitamin B-12: 162 pg/mL — ABNORMAL LOW (ref 211–911)

## 2019-08-29 LAB — PSA: PSA: 0.83 ng/mL (ref 0.10–4.00)

## 2019-08-29 LAB — HEMOGLOBIN A1C: Hgb A1c MFr Bld: 5.5 % (ref 4.6–6.5)

## 2019-08-29 LAB — TSH: TSH: 1.37 u[IU]/mL (ref 0.35–4.50)

## 2019-08-29 MED ORDER — TEMAZEPAM 30 MG PO CAPS: 30.0000 mg | ORAL_CAPSULE | Freq: Every evening | ORAL | 5 refills | Status: DC | PRN

## 2019-08-29 MED ORDER — VITAMIN D (ERGOCALCIFEROL) 1.25 MG (50000 UNIT) PO CAPS: 50000.0000 [IU] | ORAL_CAPSULE | ORAL | 0 refills | Status: DC

## 2019-08-29 NOTE — Progress Notes (Signed)
Subjective:    Patient ID: Robert [name], male    DOB: 1946/06/06, 73 y.o.   MRN: [id number]  HPI  Here for wellness and f/u;  Overall doing ok;  Pt denies Chest pain, worsening SOB, DOE, wheezing, orthopnea, PND, worsening LE edema, palpitations, dizziness or syncope.  Pt denies neurological change such as new headache, facial or extremity weakness.  Pt denies polydipsia, polyuria, or low sugar symptoms. Pt states overall good compliance with treatment and medications, good tolerability, and has been trying to follow appropriate diet.  Pt denies worsening depressive symptoms, suicidal ideation or panic. No fever, night sweats, wt loss, loss of appetite, or other constitutional symptoms.  Pt states good ability with ADL's, has low fall risk, home safety reviewed and adequate, no other significant changes in hearing or vision, and only occasionally active with exercise.  S/p left TKR doing well s/p PT.  Plans to call himself for the colonoscopy due now  No new complaints Past Medical History:  Diagnosis Date  . ALLERGIC RHINITIS 08/20/2007  . Anemia   . Arthritis    "back; knees; shoulders"  (06/08/2017)  . Asthma    "very slight"   . Barrett esophagus   . BENIGN PROSTATIC HYPERTROPHY 08/20/2007  . Coronary artery disease involving native heart with angina pectoris ([place])    a. s/p CABG x 3 in 1989;  b. MI in 1995 with BMS to VG->RCA;  c. 07/2000 PCI/BMS to native LCX;  d. Known CTO of VG->Diag;  e. Mult caths w/ stable anatomy;  f. 04/2013 Cath: LM nl, ostLAD 100, LCX 10%isr, 68m, OM1/2/3 min irregs, RI min irregs, pRCA 80-90%/ 100& mid, VG->RCA ostial Stent w/ 20-30isr, LIMA->LAD ok, Occluded VG-Diag. EF 60%-->Med Rx.  . ELBOW PAIN, LEFT 04/15/2008  . ERECTILE DYSFUNCTION 08/20/2007  . Gallbladder polyp 08/27/2015   Noted on [place] Abd  . GERD (gastroesophageal reflux disease)   . Hepatic cyst 09/06/2015   Large simple right hepatic lobe, noted on [place] Abd  . History of cardiomegaly 05/15/2013   Noted on CXR  . History of meningitis    2nd grade, 8th grade, 9th grade, age 54  . HYPERLIPIDEMIA TYPE I / IV 09/03/2008  . HYPERTENSION    "been on RX since CABG but never a problem" (06/08/2017)  . INSOMNIA-SLEEP DISORDER-UNSPEC 03/17/2008  . Microcytosis   . Myocardial infarction [facility]) June 1995  . OSA on CPAP   . PREMATURE VENTRICULAR CONTRACTIONS 08/20/2007  . SCROTAL MASS 10/09/2008  . URINARY INCONTINENCE, MALE 10/09/2008  . UTI 04/24/2009   "just this once" (05/15/2013)   Past Surgical History:  Procedure Laterality Date  . ANKLE FRACTURE SURGERY Right 1984  . BACK SURGERY    . CARDIAC CATHETERIZATION  X 2  . CARDIAC CATHETERIZATION N/A 04/01/2016   Procedure: Left Heart Cath and Cors/Grafts Angiography;  Surgeon: Leonie Man, MD;  Location: [place] [facility];  Service: Cardiovascular;  Laterality: N/A;  . CARDIAC CATHETERIZATION N/A 04/01/2016   Procedure: Intravascular Pressure Wire/FFR Study;  Surgeon: Leonie Man, MD;  Location: [place] [facility];  Service: Cardiovascular;  Laterality: N/A;  . CARDIAC CATHETERIZATION N/A 04/01/2016   Procedure: Coronary Stent Intervention;  Surgeon: Leonie Man, MD;  Location: [place] [facility];  Service: Cardiovascular;  Laterality: N/A;  . CARDIAC CATHETERIZATION  06/08/2017  . COLONOSCOPY  08/31/2011  . COLONOSCOPY W/ BIOPSIES AND POLYPECTOMY  11/20/2003   adenomatous polyp, internal and external hemorrhoids  . CORONARY ANGIOPLASTY  95,  98, 2000, 2001  . [place]- 04/01/2016   "total of 4 stents counting today" (04/01/2016)  . CORONARY ARTERY BYPASS GRAFT  1989   triple bypass [facility]C  . CYSTOSCOPY  08/07/2009   and right hydrocelectomy  . FOOT TENDON SURGERY Left 2012   "donor tendon"   . HEMI-MICRODISCECTOMY LUMBAR LAMINECTOMY LEVEL 1 Right 09/18/2013   Procedure: CENTRAL DECOMPRESSION @ L4-5 FOR SPINAL STENOSIS. EXCISION OF SYNOVIAL CYST @ L4-5 RIGHT, MICRODISCECTOMY @L4 -5 RIGHT ;   Surgeon: Tobi Bastos, MD;  Location: [facility];  Service: Orthopedics;  Laterality: Right;  . INGUINAL HERNIA REPAIR Bilateral 1970's   "2 on one side; 1 on the other"   . KNEE ARTHROSCOPY WITH MEDIAL MENISECTOMY Left 09/18/2018   Procedure: DIAGNOSTIC KNEE ARTHROSCOPY WITH MEDIAL MENISECTOMY;  Surgeon: Latanya Maudlin, MD;  Location: [facility];  Service: Orthopedics;  Laterality: Left;  66min  . LEFT HEART CATH AND CORS/GRAFTS ANGIOGRAPHY N/A 06/08/2017   Procedure: LEFT HEART CATH AND CORS/GRAFTS ANGIOGRAPHY;  Surgeon: Lorretta Harp, MD;  Location: [place] [facility];  Service: Cardiovascular;  Laterality: N/A;  . LEFT HEART CATHETERIZATION WITH CORONARY ANGIOGRAM Bilateral 05/15/2013   Procedure: LEFT HEART CATHETERIZATION WITH CORONARY ANGIOGRAM;  Surgeon: Wellington Hampshire, MD;  Location: [place] [facility];  Service: Cardiovascular;  Laterality: Bilateral;  . SHOULDER OPEN ROTATOR CUFF REPAIR Right 2009   [place]  . SURGERY SCROTAL / TESTICULAR Right 2012   "removed the sac" (05/15/2013)  . TONSILLECTOMY AND ADENOIDECTOMY  ~1961  . TOTAL KNEE ARTHROPLASTY Left 07/01/2019   Procedure: LEFT TOTAL KNEE ARTHROPLASTY;  Surgeon: Gaynelle Arabian, MD;  Location: [facility];  Service: Orthopedics;  Laterality: Left;  28min  . TRANSURETHRAL RESECTION OF PROSTATE  10/02/2009   Gyrus transurethral resection   . UPPER GI ENDOSCOPY  09/06/2011    reports that he quit smoking about 25 years ago. His smoking use included cigarettes. He has a 45.00 pack-year smoking history. He has never used smokeless tobacco. He reports previous alcohol use. He reports that he does not use drugs. family history includes Coronary artery disease in his father and mother; Esophageal cancer in his brother; Heart disease in his brother. Allergies  Allergen Reactions  . Metoprolol Other (See Comments)    severe bradycardia  . Adhesive [Tape] Rash   Current Outpatient Medications on File Prior to Visit  Medication Sig Dispense Refill   . albuterol (PROAIR HFA) 108 (90 BASE) MCG/ACT inhaler Inhale 1-2 puffs into the lungs every 6 (six) hours as needed for wheezing or shortness of breath.     Marland Kitchen amitriptyline (ELAVIL) 25 MG tablet Take 1 tablet (25 mg total) by mouth at bedtime. Reported on 01/25/2016 (Patient taking differently: Take 25 mg by mouth at bedtime. ) 90 tablet 1  . atenolol (TENORMIN) 25 MG tablet TAKE 1 TABLET AT BEDTIME (Patient taking differently: Take 25 mg by mouth at bedtime. ) 90 tablet 1  . atorvastatin (LIPITOR) 40 MG tablet TAKE 1 TABLET (40 MG TOTAL) BY MOUTH DAILY. 90 tablet 2  . clopidogrel (PLAVIX) 75 MG tablet TAKE 1 TABLET (75 MG TOTAL) BY MOUTH DAILY. (Patient taking differently: Take 75 mg by mouth daily. ) 90 tablet 0  . gabapentin (NEURONTIN) 300 MG capsule Take 1 capsule (300 mg total) by mouth 3 (three) times daily. Take a 300 mg capsule three times a day for two weeks following surgery.Then take a 300 mg capsule two times a day for two weeks. Then  take a 300 mg capsule once a day for two weeks. Then discontinue the Gabapentin. 84 capsule 0  . isosorbide mononitrate (IMDUR) 30 MG 24 hr tablet TAKE 1 TABLET TWICE DAILY 180 tablet 3  . lisinopril (ZESTRIL) 10 MG tablet TAKE 1 TABLET AT BEDTIME 90 tablet 1  . methocarbamol (ROBAXIN) 500 MG tablet Take 1 tablet (500 mg total) by mouth every 6 (six) hours as needed for muscle spasms. 40 tablet 0  . nitroGLYCERIN (NITROSTAT) 0.4 MG SL tablet Place 1 tablet (0.4 mg total) under the tongue every 5 (five) minutes as needed for chest pain. 25 tablet 3  . omeprazole (PRILOSEC) 20 MG capsule Take 20 mg by mouth daily.    Marland Kitchen oxyCODONE (OXY IR/ROXICODONE) 5 MG immediate release tablet Take 1-2 tablets (5-10 mg total) by mouth every 6 (six) hours as needed for moderate pain or severe pain. 56 tablet 0  . pantoprazole (PROTONIX) 40 MG tablet Take 1 tablet (40 mg total) by mouth daily. 14 tablet 0  . sennosides-docusate sodium (SENOKOT-S) 8.6-50 MG tablet Take 1 tablet  by mouth at bedtime.    . traMADol (ULTRAM) 50 MG tablet Take 1-2 tablets (50-100 mg total) by mouth every 6 (six) hours as needed for moderate pain. 40 tablet 0   No current facility-administered medications on file prior to visit.   Review of Systems Constitutional: Negative for other unusual diaphoresis, sweats, appetite or weight changes HENT: Negative for other worsening hearing loss, ear pain, facial swelling, mouth sores or neck stiffness.   Eyes: Negative for other worsening pain, redness or other visual disturbance.  Respiratory: Negative for other stridor or swelling Cardiovascular: Negative for other palpitations or other chest pain  Gastrointestinal: Negative for worsening diarrhea or loose stools, blood in stool, distention or other pain Genitourinary: Negative for hematuria, flank pain or other change in urine volume.  Musculoskeletal: Negative for myalgias or other joint swelling.  Skin: Negative for other color change, or other wound or worsening drainage.  Neurological: Negative for other syncope or numbness. Hematological: Negative for other adenopathy or swelling Psychiatric/Behavioral: Negative for hallucinations, other worsening agitation, SI, self-injury, or new decreased concentration All otherwise neg per pt     Objective:   Physical Exam BP 118/78   Pulse 60   Temp 98.5 F (36.9 C) (Oral)   Ht 6' (1.829 m)   Wt 236 lb (107 kg)   SpO2 97%   BMI 32.01 kg/m  VS noted,  Constitutional: Pt is oriented to person, place, and time. Appears well-developed and well-nourished, in no significant distress and comfortable Head: Normocephalic and atraumatic  Eyes: Conjunctivae and EOM are normal. Pupils are equal, round, and reactive to light Right Ear: External ear normal without discharge Left Ear: External ear normal without discharge Nose: Nose without discharge or deformity Mouth/Throat: Oropharynx is without other ulcerations and moist  Neck: Normal range of  motion. Neck supple. No JVD present. No tracheal deviation present or significant neck LA or mass Cardiovascular: Normal rate, regular rhythm, normal heart sounds and intact distal pulses.   Pulmonary/Chest: WOB normal and breath sounds without rales or wheezing  Abdominal: Soft. Bowel sounds are normal. NT. No HSM  Musculoskeletal: Normal range of motion. Exhibits no edema Lymphadenopathy: Has no other cervical adenopathy.  Neurological: Pt is alert and oriented to person, place, and time. Pt has normal reflexes. No cranial nerve deficit. Motor grossly intact, Gait intact Skin: Skin is warm and dry. No rash noted or new ulcerations Psychiatric:  Has normal mood and affect. Behavior is normal without agitation All otherwise neg per pt Lab Results  Component Value Date   WBC 7.8 08/29/2019   HGB 14.6 08/29/2019   HCT 45.2 08/29/2019   PLT 257.0 08/29/2019   GLUCOSE 103 (H) 08/29/2019   CHOL 121 08/29/2019   TRIG 81.0 08/29/2019   HDL 34.70 (L) 08/29/2019   LDLCALC 70 08/29/2019   ALT 17 08/29/2019   AST 19 08/29/2019   NA 139 08/29/2019   K 4.8 08/29/2019   CL 104 08/29/2019   CREATININE 0.95 08/29/2019   BUN 19 08/29/2019   CO2 27 08/29/2019   TSH 1.37 08/29/2019   PSA 0.83 08/29/2019   INR 1.1 06/24/2019   HGBA1C 5.5 08/29/2019       Assessment & Plan:

## 2019-08-29 NOTE — Assessment & Plan Note (Signed)
stable overall by history and exam, recent data reviewed with pt, and pt to continue medical treatment as before,  to f/u any worsening symptoms or concerns  

## 2019-08-29 NOTE — Patient Instructions (Addendum)
Please call for your colonoscopy soon.  Please continue all other medications as before, and refills have been done if requested.  Please have the pharmacy call with any other refills you may need.  Please continue your efforts at being more active, low cholesterol diet, and weight control.  You are otherwise up to date with prevention measures today.  Please keep your appointments with your specialists as you may have planned  Please go to the LAB in the Basement (turn left off the elevator) for the tests to be done today  You will be contacted by phone if any changes need to be made immediately.  Otherwise, you will receive a letter about your results with an explanation, but please check with MyChart first.  Please remember to sign up for MyChart if you have not done so, as this will be important to you in the future with finding out test results, communicating by private email, and scheduling acute appointments online when needed.  Please return in 1 year for your yearly visit, or sooner if needed

## 2019-08-29 NOTE — Assessment & Plan Note (Signed)

## 2019-08-30 ENCOUNTER — Telehealth: Payer: Self-pay | Admitting: Internal Medicine

## 2019-08-30 DIAGNOSIS — M25562 Pain in left knee: Secondary | ICD-10-CM | POA: Diagnosis not present

## 2019-08-30 DIAGNOSIS — M25662 Stiffness of left knee, not elsewhere classified: Secondary | ICD-10-CM | POA: Diagnosis not present

## 2019-08-30 NOTE — Telephone Encounter (Signed)
Result note read to patient, verbalizes understanding. TE as not routed to [name].

## 2019-09-03 DIAGNOSIS — M25562 Pain in left knee: Secondary | ICD-10-CM | POA: Diagnosis not present

## 2019-09-03 DIAGNOSIS — M25662 Stiffness of left knee, not elsewhere classified: Secondary | ICD-10-CM | POA: Diagnosis not present

## 2019-11-10 ENCOUNTER — Ambulatory Visit: Payer: Medicare HMO | Attending: Internal Medicine

## 2019-11-10 DIAGNOSIS — Z23 Encounter for immunization: Secondary | ICD-10-CM | POA: Insufficient documentation

## 2019-11-10 NOTE — Progress Notes (Signed)
   Covid-19 Vaccination Clinic  Name:  Robert [name]    MRN: [id number] DOB: [birth year]  11/10/2019  [name] was observed post Covid-19 immunization for 15 minutes without incidence. He was provided with Vaccine Information Sheet and instruction to access the V-Safe system.   [name] was instructed to call 911 with any severe reactions post vaccine: Marland Kitchen Difficulty breathing  . Swelling of your face and throat  . A fast heartbeat  . A bad rash all over your body  . Dizziness and weakness    Immunizations Administered    Name Date Dose VIS Date Route   Pfizer COVID-19 Vaccine 11/10/2019 10:43 AM 0.3 mL 08/30/2019 Intramuscular   Manufacturer: [place]   Lot: [id number]   [place]: [id number]

## 2019-12-02 ENCOUNTER — Ambulatory Visit: Payer: Medicare HMO | Attending: Internal Medicine

## 2019-12-02 DIAGNOSIS — Z23 Encounter for immunization: Secondary | ICD-10-CM

## 2019-12-02 NOTE — Progress Notes (Signed)
   Covid-19 Vaccination Clinic  Name:  Robert [name]    MRN: [id number] DOB: 1945/09/21  12/02/2019  [name] was observed post Covid-19 immunization for 15 minutes without incident. He was provided with Vaccine Information Sheet and instruction to access the V-Safe system.   [name] was instructed to call 911 with any severe reactions post vaccine: Marland Kitchen Difficulty breathing  . Swelling of face and throat  . A fast heartbeat  . A bad rash all over body  . Dizziness and weakness   Immunizations Administered    Name Date Dose VIS Date Route   Pfizer COVID-19 Vaccine 12/02/2019 11:18 AM 0.3 mL 08/30/2019 Intramuscular   Manufacturer: [place]   Lot: [id number]   [place]: [id number]

## 2019-12-12 DIAGNOSIS — M25511 Pain in right shoulder: Secondary | ICD-10-CM | POA: Diagnosis not present

## 2019-12-16 ENCOUNTER — Ambulatory Visit: Payer: Medicare HMO

## 2019-12-19 ENCOUNTER — Ambulatory Visit (INDEPENDENT_AMBULATORY_CARE_PROVIDER_SITE_OTHER): Payer: Medicare HMO | Admitting: *Deleted

## 2019-12-19 ENCOUNTER — Other Ambulatory Visit: Payer: Self-pay

## 2019-12-19 DIAGNOSIS — E538 Deficiency of other specified B group vitamins: Secondary | ICD-10-CM

## 2019-12-19 MED ORDER — CYANOCOBALAMIN 1000 MCG/ML IJ SOLN
1000.0000 ug | Freq: Once | INTRAMUSCULAR | Status: AC
  Administered 2019-12-19: 1000 ug via INTRAMUSCULAR

## 2019-12-19 NOTE — Progress Notes (Signed)
Pls cosign for B12 inj../lmb  

## 2020-01-14 DIAGNOSIS — H524 Presbyopia: Secondary | ICD-10-CM | POA: Diagnosis not present

## 2020-01-14 DIAGNOSIS — H25013 Cortical age-related cataract, bilateral: Secondary | ICD-10-CM | POA: Diagnosis not present

## 2020-01-14 DIAGNOSIS — D23112 Other benign neoplasm of skin of right lower eyelid, including canthus: Secondary | ICD-10-CM | POA: Diagnosis not present

## 2020-01-14 DIAGNOSIS — H2513 Age-related nuclear cataract, bilateral: Secondary | ICD-10-CM | POA: Diagnosis not present

## 2020-01-24 ENCOUNTER — Ambulatory Visit (INDEPENDENT_AMBULATORY_CARE_PROVIDER_SITE_OTHER): Payer: Medicare HMO | Admitting: *Deleted

## 2020-01-24 ENCOUNTER — Other Ambulatory Visit: Payer: Self-pay

## 2020-01-24 DIAGNOSIS — E538 Deficiency of other specified B group vitamins: Secondary | ICD-10-CM

## 2020-01-24 MED ORDER — CYANOCOBALAMIN 1000 MCG/ML IJ SOLN
1000.0000 ug | Freq: Once | INTRAMUSCULAR | Status: AC
  Administered 2020-01-24: 1000 ug via INTRAMUSCULAR

## 2020-01-24 NOTE — Progress Notes (Signed)
Pls cosign for B12 inj../lmb  

## 2020-01-29 DIAGNOSIS — H2513 Age-related nuclear cataract, bilateral: Secondary | ICD-10-CM | POA: Diagnosis not present

## 2020-01-29 DIAGNOSIS — H25013 Cortical age-related cataract, bilateral: Secondary | ICD-10-CM | POA: Diagnosis not present

## 2020-01-29 DIAGNOSIS — H1789 Other corneal scars and opacities: Secondary | ICD-10-CM | POA: Diagnosis not present

## 2020-02-13 DIAGNOSIS — H2511 Age-related nuclear cataract, right eye: Secondary | ICD-10-CM | POA: Diagnosis not present

## 2020-02-13 DIAGNOSIS — H25011 Cortical age-related cataract, right eye: Secondary | ICD-10-CM | POA: Diagnosis not present

## 2020-02-13 DIAGNOSIS — H25811 Combined forms of age-related cataract, right eye: Secondary | ICD-10-CM | POA: Diagnosis not present

## 2020-02-19 ENCOUNTER — Other Ambulatory Visit: Payer: Self-pay | Admitting: Physician Assistant

## 2020-02-19 ENCOUNTER — Telehealth: Payer: Self-pay

## 2020-02-19 MED ORDER — SIMVASTATIN 40 MG PO TABS: 40.0000 mg | ORAL_TABLET | Freq: Every day | ORAL | 3 refills | Status: DC

## 2020-02-19 NOTE — Telephone Encounter (Signed)
New message    Pt c/o medication issue:  1. Name of Medication: atorvastatin (LIPITOR) 40 MG tablet(Expired)  2. How are you currently taking this medication (dosage and times per day)? Once a day   3. Are you having a reaction (difficulty breathing--STAT)? No   4. What is your medication issue? Bad ingestion   5. Wants switch back to simvastatin 40 mg  6. [facility] - [place], [place]

## 2020-02-19 NOTE — Telephone Encounter (Signed)
Ok change is done, but let pt know worsening reflux would be quite unusual for the lipitor  If reflux continues without change, he should consider change back to lipitor as it is more effective, and consider other tx for reflux

## 2020-02-19 NOTE — Telephone Encounter (Signed)
Md is out of the office will forward to MD desktop to review once he return.Marland KitchenJohny Chess

## 2020-02-20 ENCOUNTER — Other Ambulatory Visit: Payer: Self-pay | Admitting: Internal Medicine

## 2020-02-20 ENCOUNTER — Other Ambulatory Visit: Payer: Self-pay

## 2020-02-20 DIAGNOSIS — M25561 Pain in right knee: Secondary | ICD-10-CM | POA: Diagnosis not present

## 2020-02-20 DIAGNOSIS — Z96652 Presence of left artificial knee joint: Secondary | ICD-10-CM | POA: Diagnosis not present

## 2020-02-20 DIAGNOSIS — M1711 Unilateral primary osteoarthritis, right knee: Secondary | ICD-10-CM | POA: Diagnosis not present

## 2020-02-20 MED ORDER — SIMVASTATIN 40 MG PO TABS: 40.0000 mg | ORAL_TABLET | Freq: Every day | ORAL | 3 refills | Status: DC

## 2020-02-20 NOTE — Telephone Encounter (Signed)
Script faxed in today to [facility].

## 2020-02-20 NOTE — Telephone Encounter (Signed)
Error

## 2020-02-20 NOTE — Telephone Encounter (Signed)
New message:   Pt is calling and states we sent simvastatin (ZOCOR) 40 MG tablet to [facility] for him but he only uses [facility] order. He would like this resent please. Please advise.

## 2020-02-20 NOTE — Telephone Encounter (Addendum)
Notified pt w/MD response. Md send rx to [facility] pt want rx top go mail service [employer]. Inform pt will send to [facility].Marland KitchenJohny Chess.Marland KitchenJohny Chess

## 2020-02-20 NOTE — Addendum Note (Signed)
Addended by: Earnstine Regal on: 02/20/2020 11:14 AM   Modules accepted: Orders

## 2020-02-24 ENCOUNTER — Ambulatory Visit (INDEPENDENT_AMBULATORY_CARE_PROVIDER_SITE_OTHER): Payer: Medicare HMO | Admitting: *Deleted

## 2020-02-24 ENCOUNTER — Other Ambulatory Visit: Payer: Self-pay

## 2020-02-24 DIAGNOSIS — E538 Deficiency of other specified B group vitamins: Secondary | ICD-10-CM | POA: Diagnosis not present

## 2020-02-24 MED ORDER — CYANOCOBALAMIN 1000 MCG/ML IJ SOLN
1000.0000 ug | Freq: Once | INTRAMUSCULAR | Status: AC
  Administered 2020-02-24: 1000 ug via INTRAMUSCULAR

## 2020-02-24 NOTE — Progress Notes (Signed)
Pls cosign for B12 inj../lmb  

## 2020-03-03 ENCOUNTER — Telehealth: Payer: Self-pay

## 2020-03-03 NOTE — Telephone Encounter (Signed)
Received fax from [facility] stating that pt needs PA for Atorvastatin. I submitted this on CoverMyMeds ([id number]) but it stated that pt did not need PA for this medication. I called [employer] and the problem was that Simvastatin was still active on the pt's med list. Per O/V note from Melina Copa, PA on 07/18/18, Pt was switched from Simvastatin to Atorvastatin. I discontinued Simvastatin from med list.  [employer] will fax a form to our office for [place] to fill out for documentation and approval of Atorvastatin.

## 2020-03-03 NOTE — Telephone Encounter (Signed)
Reference # for call to [facility]: [id number]

## 2020-03-04 NOTE — Telephone Encounter (Signed)
**Note De-Identified Robert [name] Obfuscation** Letter received Robert [name] fax from [facility] stating that have approved the pts Atorvastatin PA. Approval good until 09/18/2020.  The pt uses [employer] [facility] so they are aware of their decision.

## 2020-03-09 ENCOUNTER — Other Ambulatory Visit: Payer: Self-pay | Admitting: Internal Medicine

## 2020-03-09 NOTE — Telephone Encounter (Signed)
Done erx 

## 2020-03-12 DIAGNOSIS — H25012 Cortical age-related cataract, left eye: Secondary | ICD-10-CM | POA: Diagnosis not present

## 2020-03-12 DIAGNOSIS — H25812 Combined forms of age-related cataract, left eye: Secondary | ICD-10-CM | POA: Diagnosis not present

## 2020-03-12 DIAGNOSIS — H2512 Age-related nuclear cataract, left eye: Secondary | ICD-10-CM | POA: Diagnosis not present

## 2020-03-25 ENCOUNTER — Other Ambulatory Visit: Payer: Self-pay

## 2020-03-25 ENCOUNTER — Ambulatory Visit (INDEPENDENT_AMBULATORY_CARE_PROVIDER_SITE_OTHER): Payer: Medicare HMO | Admitting: *Deleted

## 2020-03-25 DIAGNOSIS — E538 Deficiency of other specified B group vitamins: Secondary | ICD-10-CM

## 2020-03-25 MED ORDER — CYANOCOBALAMIN 1000 MCG/ML IJ SOLN
1000.0000 ug | Freq: Once | INTRAMUSCULAR | Status: AC
  Administered 2020-03-25: 1000 ug via INTRAMUSCULAR

## 2020-03-25 NOTE — Progress Notes (Signed)
Pls cosign for B12 inj../lmb  

## 2020-04-27 ENCOUNTER — Other Ambulatory Visit: Payer: Self-pay

## 2020-04-27 ENCOUNTER — Ambulatory Visit (INDEPENDENT_AMBULATORY_CARE_PROVIDER_SITE_OTHER): Payer: Medicare HMO | Admitting: *Deleted

## 2020-04-27 DIAGNOSIS — E538 Deficiency of other specified B group vitamins: Secondary | ICD-10-CM | POA: Diagnosis not present

## 2020-04-27 MED ORDER — CYANOCOBALAMIN 1000 MCG/ML IJ SOLN
1000.0000 ug | Freq: Once | INTRAMUSCULAR | Status: AC
  Administered 2020-04-27: 1000 ug via INTRAMUSCULAR

## 2020-04-27 NOTE — Progress Notes (Signed)
Pls cosign for B12 inj in absence of PCP../lmb   

## 2020-05-27 ENCOUNTER — Other Ambulatory Visit: Payer: Self-pay

## 2020-05-27 ENCOUNTER — Ambulatory Visit (INDEPENDENT_AMBULATORY_CARE_PROVIDER_SITE_OTHER): Payer: Medicare HMO

## 2020-05-27 DIAGNOSIS — E538 Deficiency of other specified B group vitamins: Secondary | ICD-10-CM

## 2020-05-27 MED ORDER — CYANOCOBALAMIN 1000 MCG/ML IJ SOLN
1000.0000 ug | Freq: Once | INTRAMUSCULAR | Status: AC
  Administered 2020-05-27: 1000 ug via INTRAMUSCULAR

## 2020-05-27 NOTE — Progress Notes (Signed)
Pt here for 6th monthly (& last) B12 injection per Dr Jenny Reichmann  B12 1044mcg given IM left deltoid and pt tolerated injection well.

## 2020-08-05 ENCOUNTER — Other Ambulatory Visit: Payer: Self-pay

## 2020-08-05 ENCOUNTER — Ambulatory Visit: Payer: Medicare HMO | Admitting: Cardiovascular Disease

## 2020-08-05 ENCOUNTER — Encounter: Payer: Self-pay | Admitting: Cardiovascular Disease

## 2020-08-05 VITALS — BP 120/80 | HR 58 | Ht 72.0 in | Wt 245.6 lb

## 2020-08-05 DIAGNOSIS — I25119 Atherosclerotic heart disease of native coronary artery with unspecified angina pectoris: Secondary | ICD-10-CM | POA: Diagnosis not present

## 2020-08-05 DIAGNOSIS — E782 Mixed hyperlipidemia: Secondary | ICD-10-CM | POA: Diagnosis not present

## 2020-08-05 DIAGNOSIS — I10 Essential (primary) hypertension: Secondary | ICD-10-CM | POA: Diagnosis not present

## 2020-08-05 NOTE — Patient Instructions (Signed)

## 2020-08-05 NOTE — Progress Notes (Signed)
Cardiology Office Note:    Date:  08/05/2020   ID:  Robert [name], DOB May 30, 1946, MRN [id number]  PCP:  Biagio Borg, MD  [facility] Cardiologist:  Sherren Mocha, MD  [place] Electrophysiologist:  None   Referring MD: Biagio Borg, MD   Chief Complaint  Patient presents with  . Coronary Artery Disease    History of Present Illness:    Robert [name] is a 74 y.o. male with a hx of:  Coronary artery disease  ? S/p CABG in 1989 ? S/p multiple PCIs since CABG ? Cath 9/18: OM2 stent patent, S-RCA stent patent, L-LAD patent, S-Dx 19 (old), EF 50-55  Hypertension   Hyperlipidemia   PVCs  GERD  He brings in labs from the [place] system today.  These are reviewed and show hemoglobin 14.8 platelet count 260,000 cholesterol 117 triglycerides 86 HDL 36 LDL 62 glucose 101 creatinine 0.9 sodium 140 potassium 4.2.  The patient is here alone today.  He has been doing fairly well without any clear-cut chest pain or pressure.  He denies shortness of breath.  He had his knee replaced last year and has done well with this.  He gets out and plays golf on a regular basis with no exertional symptoms.  He states that sometimes he has an uneasy feeling in his chest and takes nitroglycerin for that.  It is not like prior angina.  Overall he is pleased with how he is doing.  He denies leg edema, orthopnea, PND, or heart palpitations.  Past Medical History:  Diagnosis Date  . ALLERGIC RHINITIS 08/20/2007  . Anemia   . Arthritis    "back; knees; shoulders"  (06/08/2017)  . Asthma    "very slight"   . Barrett esophagus   . BENIGN PROSTATIC HYPERTROPHY 08/20/2007  . Coronary artery disease involving native heart with angina pectoris ([place])    a. s/p CABG x 3 in 1989;  b. MI in 1995 with BMS to VG->RCA;  c. 07/2000 PCI/BMS to native LCX;  d. Known CTO of VG->Diag;  e. Mult caths w/ stable anatomy;  f. 04/2013 Cath: LM nl, ostLAD 100, LCX 10%isr, 28m, OM1/2/3 min irregs, RI min irregs,  pRCA 80-90%/ 100& mid, VG->RCA ostial Stent w/ 20-30isr, LIMA->LAD ok, Occluded VG-Diag. EF 60%-->Med Rx.  . ELBOW PAIN, LEFT 04/15/2008  . ERECTILE DYSFUNCTION 08/20/2007  . Gallbladder polyp 08/27/2015   Noted on [place] Abd  . GERD (gastroesophageal reflux disease)   . Hepatic cyst 09/06/2015   Large simple right hepatic lobe, noted on [place] Abd  . History of cardiomegaly 05/15/2013   Noted on CXR  . History of meningitis    2nd grade, 8th grade, 9th grade, age 43  . HYPERLIPIDEMIA TYPE I / IV 09/03/2008  . HYPERTENSION    "been on RX since CABG but never a problem" (06/08/2017)  . INSOMNIA-SLEEP DISORDER-UNSPEC 03/17/2008  . Microcytosis   . Myocardial infarction [facility]) June 1995  . OSA on CPAP   . PREMATURE VENTRICULAR CONTRACTIONS 08/20/2007  . SCROTAL MASS 10/09/2008  . URINARY INCONTINENCE, MALE 10/09/2008  . UTI 04/24/2009   "just this once" (05/15/2013)    Past Surgical History:  Procedure Laterality Date  . ANKLE FRACTURE SURGERY Right 1984  . BACK SURGERY    . CARDIAC CATHETERIZATION  X 2  . CARDIAC CATHETERIZATION N/A 04/01/2016   Procedure: Left Heart Cath and Cors/Grafts Angiography;  Surgeon: Leonie Man, MD;  Location: [place] CV LAB;  Service:  Cardiovascular;  Laterality: N/A;  . CARDIAC CATHETERIZATION N/A 04/01/2016   Procedure: Intravascular Pressure Wire/FFR Study;  Surgeon: Leonie Man, MD;  Location: [place] CV LAB;  Service: Cardiovascular;  Laterality: N/A;  . CARDIAC CATHETERIZATION N/A 04/01/2016   Procedure: Coronary Stent Intervention;  Surgeon: Leonie Man, MD;  Location: [place] CV LAB;  Service: Cardiovascular;  Laterality: N/A;  . CARDIAC CATHETERIZATION  06/08/2017  . COLONOSCOPY  08/31/2011  . COLONOSCOPY W/ BIOPSIES AND POLYPECTOMY  11/20/2003   adenomatous polyp, internal and external hemorrhoids  . CORONARY ANGIOPLASTY  95, 98, 2000, 2001  . [place]- 04/01/2016   "total of 4 stents counting today"  (04/01/2016)  . CORONARY ARTERY BYPASS GRAFT  1989   triple bypass MUSC  . CYSTOSCOPY  08/07/2009   and right hydrocelectomy  . FOOT TENDON SURGERY Left 2012   "donor tendon"   . HEMI-MICRODISCECTOMY LUMBAR LAMINECTOMY LEVEL 1 Right 09/18/2013   Procedure: CENTRAL DECOMPRESSION @ L4-5 FOR SPINAL STENOSIS. EXCISION OF SYNOVIAL CYST @ L4-5 RIGHT, MICRODISCECTOMY @L4 -5 RIGHT ;  Surgeon: Tobi Bastos, MD;  Location: [facility];  Service: Orthopedics;  Laterality: Right;  . INGUINAL HERNIA REPAIR Bilateral 1970's   "2 on one side; 1 on the other"   . KNEE ARTHROSCOPY WITH MEDIAL MENISECTOMY Left 09/18/2018   Procedure: DIAGNOSTIC KNEE ARTHROSCOPY WITH MEDIAL MENISECTOMY;  Surgeon: Latanya Maudlin, MD;  Location: [facility];  Service: Orthopedics;  Laterality: Left;  82min  . LEFT HEART CATH AND CORS/GRAFTS ANGIOGRAPHY N/A 06/08/2017   Procedure: LEFT HEART CATH AND CORS/GRAFTS ANGIOGRAPHY;  Surgeon: Lorretta Harp, MD;  Location: [place] CV LAB;  Service: Cardiovascular;  Laterality: N/A;  . LEFT HEART CATHETERIZATION WITH CORONARY ANGIOGRAM Bilateral 05/15/2013   Procedure: LEFT HEART CATHETERIZATION WITH CORONARY ANGIOGRAM;  Surgeon: Wellington Hampshire, MD;  Location: [place] CATH LAB;  Service: Cardiovascular;  Laterality: Bilateral;  . SHOULDER OPEN ROTATOR CUFF REPAIR Right 2009   [place]  . SURGERY SCROTAL / TESTICULAR Right 2012   "removed the sac" (05/15/2013)  . TONSILLECTOMY AND ADENOIDECTOMY  ~1961  . TOTAL KNEE ARTHROPLASTY Left 07/01/2019   Procedure: LEFT TOTAL KNEE ARTHROPLASTY;  Surgeon: Gaynelle Arabian, MD;  Location: [facility];  Service: Orthopedics;  Laterality: Left;  30min  . TRANSURETHRAL RESECTION OF PROSTATE  10/02/2009   [place] transurethral resection   . UPPER GI ENDOSCOPY  09/06/2011    Current Medications: Current Meds  Medication Sig  . albuterol (PROAIR HFA) 108 (90 BASE) MCG/ACT inhaler Inhale 1-2 puffs into the lungs every 6 (six) hours as needed for wheezing or shortness  of breath.   Marland Kitchen amitriptyline (ELAVIL) 25 MG tablet Take 1 tablet (25 mg total) by mouth at bedtime. Reported on 01/25/2016  . amoxicillin (AMOXIL) 500 MG capsule amoxicillin 500 mg capsule  TAKE FOUR CAPSULES BY MOUTH ONE HOUR BEFORE APPOINTMENT  . atenolol (TENORMIN) 25 MG tablet TAKE 1 TABLET AT BEDTIME  . clopidogrel (PLAVIX) 75 MG tablet TAKE 1 TABLET (75 MG TOTAL) BY MOUTH DAILY.  . isosorbide mononitrate (IMDUR) 30 MG 24 hr tablet TAKE 1 TABLET TWICE DAILY  . lisinopril (ZESTRIL) 10 MG tablet TAKE 1 TABLET AT BEDTIME  . nitroGLYCERIN (NITROSTAT) 0.4 MG SL tablet Place 1 tablet (0.4 mg total) under the tongue every 5 (five) minutes as needed for chest pain.  Marland Kitchen omeprazole (PRILOSEC) 20 MG capsule Take 20 mg by mouth daily.  . sennosides-docusate sodium (SENOKOT-S) 8.6-50 MG tablet Take 1 tablet  by mouth at bedtime.  . simvastatin (ZOCOR) 40 MG tablet   . temazepam (RESTORIL) 30 MG capsule TAKE 1 CAPSULE BY MOUTH AT BEDTIME AS NEEDED FOR SLEEP  . Vitamin D, Ergocalciferol, (DRISDOL) 1.25 MG (50000 UT) CAPS capsule Take 1 capsule (50,000 Units total) by mouth every 7 (seven) days.     Allergies:   Metoprolol, Lipitor [atorvastatin], and Adhesive [tape]   Social History   Socioeconomic History  . Marital status: Married    Spouse name: Not on file  . Number of children: 1  . Years of education: Not on file  . Highest education level: Not on file  Occupational History  . Occupation: retired Holiday representative [employer]    Comment: part time golf course  Tobacco Use  . Smoking status: Former Smoker    Packs/day: 1.00    Years: 45.00    Pack years: 45.00    Types: Cigarettes    Quit date: 04/23/1994    Years since quitting: 26.3  . Smokeless tobacco: Never Used  Vaping Use  . Vaping Use: Never used  Substance and Sexual Activity  . Alcohol use: Not Currently    Comment:  "I've  quit drinking in 1989"  . Drug use: No  . Sexual activity: Yes  Other Topics Concern  . Not on file   Social History Narrative  . Not on file   Social Determinants of Health   Financial Resource Strain:   . Difficulty of Paying Living Expenses: Not on file  Food Insecurity:   . Worried About Charity fundraiser in the Last Year: Not on file  . Ran Out of Food in the Last Year: Not on file  Transportation Needs:   . Lack of Transportation (Medical): Not on file  . Lack of Transportation (Non-Medical): Not on file  Physical Activity:   . Days of Exercise per Week: Not on file  . Minutes of Exercise per Session: Not on file  Stress:   . Feeling of Stress : Not on file  Social Connections:   . Frequency of Communication with Friends and Family: Not on file  . Frequency of Social Gatherings with Friends and Family: Not on file  . Attends Religious Services: Not on file  . Active Member of Clubs or Organizations: Not on file  . Attends Archivist Meetings: Not on file  . Marital Status: Not on file     Family History: The patient's family history includes Coronary artery disease in his father and mother; Esophageal cancer in his brother; Heart disease in his brother.  ROS:   Please see the history of present illness.    All other systems reviewed and are negative.  EKGs/Labs/Other Studies Reviewed:    EKG:  EKG is not ordered today.    Recent Labs: 08/29/2019: ALT 17; BUN 19; Creatinine, Ser 0.95; Hemoglobin 14.6; Platelets 257.0; Potassium 4.8; Sodium 139; TSH 1.37  Recent Lipid Panel    Component Value Date/Time   CHOL 121 08/29/2019 1157   CHOL 119 08/29/2018 1416   TRIG 81.0 08/29/2019 1157   HDL 34.70 (L) 08/29/2019 1157   HDL 38 (L) 08/29/2018 1416   CHOLHDL 3 08/29/2019 1157   VLDL 16.2 08/29/2019 1157   LDLCALC 70 08/29/2019 1157   LDLCALC 67 08/29/2018 1416     Risk Assessment/Calculations:       Physical Exam:    VS:  BP 120/80   Pulse (!) 58   Ht 6' (1.829 m)  Wt 245 lb 9.6 oz (111.4 kg)   SpO2 98%   BMI 33.31 kg/m     Wt Readings  from Last 3 Encounters:  08/05/20 245 lb 9.6 oz (111.4 kg)  08/29/19 236 lb (107 kg)  07/01/19 247 lb (112 kg)     GEN:  Well nourished, well developed in no acute distress HEENT: Normal NECK: No JVD; No carotid bruits LYMPHATICS: No lymphadenopathy CARDIAC: RRR, no murmurs, rubs, gallops RESPIRATORY:  Clear to auscultation without rales, wheezing or rhonchi  ABDOMEN: Soft, non-tender, non-distended MUSCULOSKELETAL:  No edema; No deformity  SKIN: Warm and dry NEUROLOGIC:  Alert and oriented x 3 PSYCHIATRIC:  Normal affect   ASSESSMENT:    1. Coronary artery disease involving native coronary artery of native heart with angina pectoris ([place])   2. Essential hypertension   3. Mixed hyperlipidemia    PLAN:    In order of problems listed above:  1. The patient's symptoms appear to be well controlled on a combination of atenolol and isosorbide.  He remains on antiplatelet therapy with clopidogrel.  He is treated with a high intensity statin drug.  No changes are made in his medical program today.  He will return in 1 year for follow-up evaluation unless problems arise in the interim. 2. Blood pressure well controlled on atenolol, isosorbide, and lisinopril.  Labs through the [place] system are reviewed as outlined in the HPI. 3. Lipids are at goal on atorvastatin 40 mg daily.    Shared Decision Making/Informed Consent        Medication Adjustments/Labs and Tests Ordered: Current medicines are reviewed at length with the patient today.  Concerns regarding medicines are outlined above.  No orders of the defined types were placed in this encounter.  No orders of the defined types were placed in this encounter.   Patient Instructions  Medication Instructions:  Your provider recommends that you continue on your current medications as directed. Please refer to the Current Medication list given to you today.   *If you need a refill on your cardiac medications before your next  appointment, please call your pharmacy*   Follow-Up: At [facility], you and your health needs are our priority.  As part of our continuing mission to provide you with exceptional heart care, we have created designated Provider Care Teams.  These Care Teams include your primary Cardiologist (physician) and Advanced Practice Providers (APPs -  Physician Assistants and Nurse Practitioners) who all work together to provide you with the care you need, when you need it. Your next appointment:   12 month(s) The format for your next appointment:   In Person Provider:   You may see Sherren Mocha, MD or one of the following Advanced Practice Providers on your designated Care Team:    Richardson Dopp, PA-C  Robbie Lis, [place]      Signed, Sherren Mocha, MD  08/05/2020 4:15 PM    [place]

## 2020-08-07 ENCOUNTER — Ambulatory Visit: Payer: Medicare HMO | Admitting: Cardiovascular Disease

## 2020-08-28 ENCOUNTER — Encounter (HOSPITAL_COMMUNITY)
Admission: EM | Disposition: A | Discharge status: Home / Self Care | Payer: Self-pay | Source: Home / Self Care | Attending: Cardiovascular Disease

## 2020-08-28 ENCOUNTER — Emergency Department (HOSPITAL_COMMUNITY): Payer: Medicare HMO

## 2020-08-28 ENCOUNTER — Inpatient Hospital Stay (HOSPITAL_COMMUNITY)
Admission: EM | Admit: 2020-08-28 | Discharge: 2020-08-29 | DRG: 247 | Disposition: A | Discharge status: Home / Self Care | Payer: Medicare HMO | Attending: Cardiovascular Disease | Admitting: Cardiovascular Disease

## 2020-08-28 DIAGNOSIS — K219 Gastro-esophageal reflux disease without esophagitis: Secondary | ICD-10-CM | POA: Diagnosis present

## 2020-08-28 DIAGNOSIS — R079 Chest pain, unspecified: Secondary | ICD-10-CM

## 2020-08-28 DIAGNOSIS — R0602 Shortness of breath: Secondary | ICD-10-CM | POA: Diagnosis not present

## 2020-08-28 DIAGNOSIS — Z87891 Personal history of nicotine dependence: Secondary | ICD-10-CM

## 2020-08-28 DIAGNOSIS — E785 Hyperlipidemia, unspecified: Secondary | ICD-10-CM | POA: Diagnosis present

## 2020-08-28 DIAGNOSIS — R0902 Hypoxemia: Secondary | ICD-10-CM | POA: Diagnosis not present

## 2020-08-28 DIAGNOSIS — I214 Non-ST elevation (NSTEMI) myocardial infarction: Principal | ICD-10-CM

## 2020-08-28 DIAGNOSIS — I2510 Atherosclerotic heart disease of native coronary artery without angina pectoris: Secondary | ICD-10-CM | POA: Diagnosis not present

## 2020-08-28 DIAGNOSIS — I160 Hypertensive urgency: Secondary | ICD-10-CM | POA: Diagnosis present

## 2020-08-28 DIAGNOSIS — Z9861 Coronary angioplasty status: Secondary | ICD-10-CM

## 2020-08-28 DIAGNOSIS — E7800 Pure hypercholesterolemia, unspecified: Secondary | ICD-10-CM | POA: Diagnosis not present

## 2020-08-28 DIAGNOSIS — I252 Old myocardial infarction: Secondary | ICD-10-CM

## 2020-08-28 DIAGNOSIS — I200 Unstable angina: Secondary | ICD-10-CM

## 2020-08-28 DIAGNOSIS — Z96652 Presence of left artificial knee joint: Secondary | ICD-10-CM | POA: Diagnosis not present

## 2020-08-28 DIAGNOSIS — R0789 Other chest pain: Secondary | ICD-10-CM | POA: Diagnosis not present

## 2020-08-28 DIAGNOSIS — Z20822 Contact with and (suspected) exposure to covid-19: Secondary | ICD-10-CM | POA: Diagnosis not present

## 2020-08-28 DIAGNOSIS — I10 Essential (primary) hypertension: Secondary | ICD-10-CM | POA: Diagnosis present

## 2020-08-28 DIAGNOSIS — I517 Cardiomegaly: Secondary | ICD-10-CM | POA: Diagnosis not present

## 2020-08-28 DIAGNOSIS — Z7902 Long term (current) use of antithrombotics/antiplatelets: Secondary | ICD-10-CM | POA: Diagnosis not present

## 2020-08-28 DIAGNOSIS — Z951 Presence of aortocoronary bypass graft: Secondary | ICD-10-CM | POA: Diagnosis not present

## 2020-08-28 DIAGNOSIS — Z79899 Other long term (current) drug therapy: Secondary | ICD-10-CM

## 2020-08-28 DIAGNOSIS — Z955 Presence of coronary angioplasty implant and graft: Secondary | ICD-10-CM | POA: Diagnosis not present

## 2020-08-28 DIAGNOSIS — I25119 Atherosclerotic heart disease of native coronary artery with unspecified angina pectoris: Secondary | ICD-10-CM | POA: Diagnosis present

## 2020-08-28 DIAGNOSIS — E781 Pure hyperglyceridemia: Secondary | ICD-10-CM | POA: Diagnosis present

## 2020-08-28 HISTORY — PX: LEFT HEART CATH AND CORS/GRAFTS ANGIOGRAPHY: CATH118250

## 2020-08-28 LAB — RESP PANEL BY RT-PCR (FLU A&B, COVID) ARPGX2
Influenza A by PCR: NEGATIVE
Influenza B by PCR: NEGATIVE
SARS Coronavirus 2 by RT PCR: NEGATIVE

## 2020-08-28 LAB — COMPREHENSIVE METABOLIC PANEL
ALT: 22 U/L (ref 0–44)
AST: 27 U/L (ref 15–41)
Albumin: 3.5 g/dL (ref 3.5–5.0)
Alkaline Phosphatase: 88 U/L (ref 38–126)
Anion gap: 9 (ref 5–15)
BUN: 18 mg/dL (ref 8–23)
CO2: 23 mmol/L (ref 22–32)
Calcium: 8.3 mg/dL — ABNORMAL LOW (ref 8.9–10.3)
Chloride: 105 mmol/L (ref 98–111)
Creatinine, Ser: 0.89 mg/dL (ref 0.61–1.24)
GFR, Estimated: >60 mL/min (ref >60)
Glucose, Bld: 112 mg/dL — ABNORMAL HIGH (ref 70–99)
Potassium: 4.5 mmol/L (ref 3.5–5.1)
Sodium: 137 mmol/L (ref 135–145)
Total Bilirubin: 0.3 mg/dL (ref 0.3–1.2)
Total Protein: 6.3 g/dL — ABNORMAL LOW (ref 6.5–8.1)

## 2020-08-28 LAB — HEPARIN LEVEL (UNFRACTIONATED): Heparin Unfractionated: 2.08 IU/mL — ABNORMAL HIGH (ref 0.30–0.70)

## 2020-08-28 LAB — CBC
HCT: 44.6 % (ref 39.0–52.0)
Hemoglobin: 13.4 g/dL (ref 13.0–17.0)
MCH: 22.9 pg — ABNORMAL LOW (ref 26.0–34.0)
MCHC: 30 g/dL (ref 30.0–36.0)
MCV: 76.2 fL — ABNORMAL LOW (ref 80.0–100.0)
Platelets: 223 10*3/uL (ref 150–400)
RBC: 5.85 MIL/uL — ABNORMAL HIGH (ref 4.22–5.81)
RDW: 16 % — ABNORMAL HIGH (ref 11.5–15.5)
WBC: 5.7 10*3/uL (ref 4.0–10.5)
nRBC: 0 % (ref 0.0–0.2)

## 2020-08-28 LAB — TROPONIN I (HIGH SENSITIVITY)
Troponin I (High Sensitivity): 33 ng/L — ABNORMAL HIGH (ref <18)
Troponin I (High Sensitivity): 103 ng/L (ref <18)
Troponin I (High Sensitivity): 174 ng/L (ref <18)
Troponin I (High Sensitivity): 189 ng/L (ref <18)

## 2020-08-28 LAB — CBG MONITORING, ED: Glucose-Capillary: 106 mg/dL — ABNORMAL HIGH (ref 70–99)

## 2020-08-28 LAB — POCT ACTIVATED CLOTTING TIME: Activated Clotting Time: 315 seconds

## 2020-08-28 LAB — LIPASE, BLOOD: Lipase: 33 U/L (ref 11–51)

## 2020-08-28 SURGERY — LEFT HEART CATH AND CORS/GRAFTS ANGIOGRAPHY
Anesthesia: LOCAL

## 2020-08-28 MED ORDER — MIDAZOLAM HCL 2 MG/2ML IJ SOLN
INTRAMUSCULAR | Status: AC
  Filled 2020-08-28: qty 2

## 2020-08-28 MED ORDER — SODIUM CHLORIDE 0.9% FLUSH: 3.0000 mL | Freq: Two times a day (BID) | INTRAVENOUS | Status: DC

## 2020-08-28 MED ORDER — ONDANSETRON HCL 4 MG/2ML IJ SOLN: 4.0000 mg | Freq: Four times a day (QID) | INTRAMUSCULAR | Status: DC | PRN

## 2020-08-28 MED ORDER — ATENOLOL 25 MG PO TABS
25.0000 mg | ORAL_TABLET | Freq: Every day | ORAL | Status: DC
  Administered 2020-08-28: 25 mg via ORAL
  Filled 2020-08-28: qty 1

## 2020-08-28 MED ORDER — TEMAZEPAM 15 MG PO CAPS
30.0000 mg | ORAL_CAPSULE | Freq: Every evening | ORAL | Status: DC | PRN
  Administered 2020-08-28: 30 mg via ORAL
  Filled 2020-08-28: qty 2

## 2020-08-28 MED ORDER — SODIUM CHLORIDE 0.9 % WEIGHT BASED INFUSION
1.0000 mL/kg/h | INTRAVENOUS | Status: AC
  Administered 2020-08-28: 1 mL/kg/h via INTRAVENOUS

## 2020-08-28 MED ORDER — SODIUM CHLORIDE 0.9 % IV SOLN
INTRAVENOUS | Status: AC | PRN
  Administered 2020-08-28: 10 mL/h via INTRAVENOUS

## 2020-08-28 MED ORDER — LISINOPRIL 10 MG PO TABS
10.0000 mg | ORAL_TABLET | Freq: Every day | ORAL | Status: DC
  Administered 2020-08-28: 10 mg via ORAL
  Filled 2020-08-28: qty 1

## 2020-08-28 MED ORDER — HEPARIN SODIUM (PORCINE) 1000 UNIT/ML IJ SOLN
INTRAMUSCULAR | Status: DC | PRN
  Administered 2020-08-28: 5000 [IU] via INTRAVENOUS
  Administered 2020-08-28: 6000 [IU] via INTRAVENOUS

## 2020-08-28 MED ORDER — ACETAMINOPHEN 325 MG PO TABS: 650.0000 mg | ORAL_TABLET | ORAL | Status: DC | PRN

## 2020-08-28 MED ORDER — NITROGLYCERIN 1 MG/10 ML FOR IR/CATH LAB
INTRA_ARTERIAL | Status: AC
  Filled 2020-08-28: qty 10

## 2020-08-28 MED ORDER — AMLODIPINE BESYLATE 5 MG PO TABS
5.0000 mg | ORAL_TABLET | Freq: Every day | ORAL | Status: DC
  Administered 2020-08-28 – 2020-08-29 (×2): 5 mg via ORAL
  Filled 2020-08-28 (×2): qty 1

## 2020-08-28 MED ORDER — CLOPIDOGREL BISULFATE 75 MG PO TABS
75.0000 mg | ORAL_TABLET | Freq: Every day | ORAL | Status: DC
  Administered 2020-08-29: 08:00:00 75 mg via ORAL
  Filled 2020-08-28: qty 1

## 2020-08-28 MED ORDER — NITROGLYCERIN IN D5W 200-5 MCG/ML-% IV SOLN
0.0000 ug/min | INTRAVENOUS | Status: DC
  Administered 2020-08-28: 5 ug/min via INTRAVENOUS
  Filled 2020-08-28: qty 250

## 2020-08-28 MED ORDER — VERAPAMIL HCL 2.5 MG/ML IV SOLN
INTRAVENOUS | Status: DC | PRN
  Administered 2020-08-28: 10 mL via INTRA_ARTERIAL

## 2020-08-28 MED ORDER — SIMVASTATIN 20 MG PO TABS
20.0000 mg | ORAL_TABLET | Freq: Every day | ORAL | Status: DC
  Administered 2020-08-28: 20 mg via ORAL
  Filled 2020-08-28: qty 1

## 2020-08-28 MED ORDER — PANTOPRAZOLE SODIUM 40 MG PO TBEC
40.0000 mg | DELAYED_RELEASE_TABLET | Freq: Every day | ORAL | Status: DC
  Administered 2020-08-28 – 2020-08-29 (×2): 40 mg via ORAL
  Filled 2020-08-28 (×2): qty 1

## 2020-08-28 MED ORDER — HEPARIN (PORCINE) 25000 UT/250ML-% IV SOLN
1300.0000 [IU]/h | INTRAVENOUS | Status: DC
  Administered 2020-08-28: 1300 [IU]/h via INTRAVENOUS
  Filled 2020-08-28: qty 250

## 2020-08-28 MED ORDER — NITROGLYCERIN 1 MG/10 ML FOR IR/CATH LAB
INTRA_ARTERIAL | Status: DC | PRN
  Administered 2020-08-28 (×2): 200 ug via INTRACORONARY

## 2020-08-28 MED ORDER — HEPARIN (PORCINE) IN NACL 1000-0.9 UT/500ML-% IV SOLN
INTRAVENOUS | Status: DC | PRN
  Administered 2020-08-28 (×2): 500 mL

## 2020-08-28 MED ORDER — FENTANYL CITRATE (PF) 100 MCG/2ML IJ SOLN
INTRAMUSCULAR | Status: AC
  Filled 2020-08-28: qty 2

## 2020-08-28 MED ORDER — ASPIRIN EC 81 MG PO TBEC
81.0000 mg | DELAYED_RELEASE_TABLET | Freq: Every day | ORAL | Status: DC
  Administered 2020-08-29: 08:00:00 81 mg via ORAL
  Filled 2020-08-28: qty 1

## 2020-08-28 MED ORDER — HYDRALAZINE HCL 20 MG/ML IJ SOLN: 10.0000 mg | INTRAMUSCULAR | Status: AC | PRN

## 2020-08-28 MED ORDER — SIMVASTATIN 20 MG PO TABS
40.0000 mg | ORAL_TABLET | Freq: Every day | ORAL | Status: DC
Start: 2020-08-28 — End: 2020-08-28

## 2020-08-28 MED ORDER — HEPARIN (PORCINE) IN NACL 1000-0.9 UT/500ML-% IV SOLN
INTRAVENOUS | Status: AC
  Filled 2020-08-28: qty 1000

## 2020-08-28 MED ORDER — SENNOSIDES-DOCUSATE SODIUM 8.6-50 MG PO TABS
1.0000 | ORAL_TABLET | Freq: Every day | ORAL | Status: DC
  Administered 2020-08-28: 1 via ORAL
  Filled 2020-08-28: qty 1

## 2020-08-28 MED ORDER — ALBUTEROL SULFATE HFA 108 (90 BASE) MCG/ACT IN AERS
1.0000 | INHALATION_SPRAY | Freq: Four times a day (QID) | RESPIRATORY_TRACT | Status: DC | PRN
  Filled 2020-08-28: qty 6.7

## 2020-08-28 MED ORDER — MIDAZOLAM HCL 2 MG/2ML IJ SOLN
INTRAMUSCULAR | Status: DC | PRN
  Administered 2020-08-28 (×2): 2 mg via INTRAVENOUS

## 2020-08-28 MED ORDER — LIDOCAINE HCL (PF) 1 % IJ SOLN
INTRAMUSCULAR | Status: AC
  Filled 2020-08-28: qty 30

## 2020-08-28 MED ORDER — LIDOCAINE HCL (PF) 1 % IJ SOLN
INTRAMUSCULAR | Status: DC | PRN
  Administered 2020-08-28: 2 mL

## 2020-08-28 MED ORDER — VERAPAMIL HCL 2.5 MG/ML IV SOLN
INTRAVENOUS | Status: AC
  Filled 2020-08-28: qty 2

## 2020-08-28 MED ORDER — SODIUM CHLORIDE 0.9% FLUSH: 3.0000 mL | INTRAVENOUS | Status: DC | PRN

## 2020-08-28 MED ORDER — HYDRALAZINE HCL 20 MG/ML IJ SOLN
INTRAMUSCULAR | Status: DC | PRN
  Administered 2020-08-28 (×2): 10 mg via INTRAVENOUS

## 2020-08-28 MED ORDER — AMITRIPTYLINE HCL 25 MG PO TABS
25.0000 mg | ORAL_TABLET | Freq: Every day | ORAL | Status: DC
  Administered 2020-08-28: 25 mg via ORAL
  Filled 2020-08-28: qty 1

## 2020-08-28 MED ORDER — FENTANYL CITRATE (PF) 100 MCG/2ML IJ SOLN
INTRAMUSCULAR | Status: DC | PRN
  Administered 2020-08-28 (×2): 25 ug via INTRAVENOUS

## 2020-08-28 MED ORDER — ISOSORBIDE MONONITRATE ER 30 MG PO TB24
30.0000 mg | ORAL_TABLET | Freq: Two times a day (BID) | ORAL | Status: DC
  Administered 2020-08-28 – 2020-08-29 (×2): 30 mg via ORAL
  Filled 2020-08-28 (×2): qty 1

## 2020-08-28 MED ORDER — HYDRALAZINE HCL 20 MG/ML IJ SOLN
INTRAMUSCULAR | Status: AC
  Filled 2020-08-28: qty 1

## 2020-08-28 MED ORDER — SODIUM CHLORIDE 0.9 % IV SOLN: 250.0000 mL | INTRAVENOUS | Status: DC | PRN

## 2020-08-28 MED ORDER — HEPARIN BOLUS VIA INFUSION
4000.0000 [IU] | Freq: Once | INTRAVENOUS | Status: AC
  Administered 2020-08-28: 4000 [IU] via INTRAVENOUS
  Filled 2020-08-28: qty 4000

## 2020-08-28 SURGICAL SUPPLY — 19 items
BALLN SAPPHIRE 2.5X15 (BALLOONS) ×2
BALLN SAPPHIRE ~~LOC~~ 3.5X18 (BALLOONS) ×1 IMPLANT
BALLOON SAPPHIRE 2.5X15 (BALLOONS) IMPLANT
CATH INFINITI 5 FR MPA2 (CATHETERS) ×1 IMPLANT
CATH INFINITI 5FR JL5 (CATHETERS) ×1 IMPLANT
CATH INFINITI 5FR MPB2 (CATHETERS) ×1 IMPLANT
CATH INFINITI 5FR MULTPACK ANG (CATHETERS) ×1 IMPLANT
CATH VISTA GUIDE 6FR XB4 (CATHETERS) ×1 IMPLANT
DEVICE RAD COMP TR BAND LRG (VASCULAR PRODUCTS) ×1 IMPLANT
GLIDESHEATH SLEND SS 6F .021 (SHEATH) ×1 IMPLANT
GUIDEWIRE INQWIRE 1.5J.035X260 (WIRE) IMPLANT
INQWIRE 1.5J .035X260CM (WIRE) ×2
KIT ENCORE 26 ADVANTAGE (KITS) ×1 IMPLANT
KIT HEART LEFT (KITS) ×2 IMPLANT
PACK CARDIAC CATHETERIZATION (CUSTOM PROCEDURE TRAY) ×2 IMPLANT
STENT RESOLUTE ONYX 3.0X26 (Permanent Stent) ×1 IMPLANT
TRANSDUCER W/STOPCOCK (MISCELLANEOUS) ×2 IMPLANT
TUBING CIL FLEX 10 FLL-RA (TUBING) ×2 IMPLANT
WIRE COUGAR XT STRL 190CM (WIRE) ×1 IMPLANT

## 2020-08-28 NOTE — Progress Notes (Signed)
ANTICOAGULATION CONSULT NOTE - Initial Consult  Pharmacy Consult for heparin Indication: chest pain/ACS  Allergies  Allergen Reactions  . Metoprolol Other (See Comments)    severe bradycardia  . Lipitor [Atorvastatin] Other (See Comments)    Worsening reflux  . Adhesive [Tape] Rash    Patient Measurements: Height: 6' (182.9 cm) Weight: 108.9 kg (240 lb) IBW/kg (Calculated) : 77.6 Heparin Dosing Weight: 100kg  Vital Signs: Temp: 98.3 F (36.8 C) (12/10 0720) Temp Source: Oral (12/10 0720) BP: 133/77 (12/10 0900) Pulse Rate: 55 (12/10 0900)  Labs: Recent Labs    08/28/20 0729  HGB 13.4  HCT 44.6  PLT 223  CREATININE 0.89  TROPONINIHS 33*    Estimated Creatinine Clearance: 92.8 mL/min (by C-G formula based on SCr of 0.89 mg/dL).   Medical History: Past Medical History:  Diagnosis Date  . ALLERGIC RHINITIS 08/20/2007  . Anemia   . Arthritis    "back; knees; shoulders"  (06/08/2017)  . Asthma    "very slight"   . Barrett esophagus   . BENIGN PROSTATIC HYPERTROPHY 08/20/2007  . Coronary artery disease involving native heart with angina pectoris ([place])    a. s/p CABG x 3 in 1989;  b. MI in 1995 with BMS to VG->RCA;  c. 07/2000 PCI/BMS to native LCX;  d. Known CTO of VG->Diag;  e. Mult caths w/ stable anatomy;  f. 04/2013 Cath: LM nl, ostLAD 100, LCX 10%isr, 75m, OM1/2/3 min irregs, RI min irregs, pRCA 80-90%/ 100& mid, VG->RCA ostial Stent w/ 20-30isr, LIMA->LAD ok, Occluded VG-Diag. EF 60%-->Med Rx.  . ELBOW PAIN, LEFT 04/15/2008  . ERECTILE DYSFUNCTION 08/20/2007  . Gallbladder polyp 08/27/2015   Noted on [place] Abd  . GERD (gastroesophageal reflux disease)   . Hepatic cyst 09/06/2015   Large simple right hepatic lobe, noted on [place] Abd  . History of cardiomegaly 05/15/2013   Noted on CXR  . History of meningitis    2nd grade, 8th grade, 9th grade, age 74  . HYPERLIPIDEMIA TYPE I / IV 09/03/2008  . HYPERTENSION    "been on RX since CABG but never a problem"  (06/08/2017)  . INSOMNIA-SLEEP DISORDER-UNSPEC 03/17/2008  . Microcytosis   . Myocardial infarction [facility]) June 1995  . OSA on CPAP   . PREMATURE VENTRICULAR CONTRACTIONS 08/20/2007  . SCROTAL MASS 10/09/2008  . URINARY INCONTINENCE, MALE 10/09/2008  . UTI 04/24/2009   "just this once" (05/15/2013)   Assessment: 74 YOM presenting with CP, hx CAD, elevated troponin.  Not on anticoagulation PTA, H/H/plts wnl.    Goal of Therapy:  Heparin level 0.3-0.7 units/ml Monitor platelets by anticoagulation protocol: Yes   Plan:  Heparin 4000 units IV x 1, and gtt at 1300 units/hr F/u 8 hour heparin level  Bertis Ruddy, PharmD Clinical Pharmacist ED Pharmacist Phone # [phone] 08/28/2020 9:21 AM

## 2020-08-28 NOTE — ED Notes (Signed)
Attempted to call nursing report to 3E  ?

## 2020-08-28 NOTE — ED Triage Notes (Signed)
Pt to ED via EMS from home c/o chest pain that started this morning around 3 am. Reports sharp and radiating down left arm. Chest pain has been intermittent over the past 5 days, but worse this morning. History : bypass 89, heart attack 95, 4 stent placements since. Denies n/v. Reports Some SHOB with exertion. Pt has received 5 nitro total, 324 mg aspirin, reports relief. Last VS: 136/90, 64, 95% ra, cbg 151. #18 LAC.

## 2020-08-28 NOTE — ED Notes (Signed)
Date and time results received: 08/28/20    Test: Troponin  Critical Value: 103  Name of Provider Notified: Ray MD

## 2020-08-28 NOTE — Interval H&P Note (Signed)
History and Physical Interval Note:  08/28/2020 4:34 PMCath Lab Visit (complete for each Cath Lab visit)  Clinical Evaluation Leading to the Procedure:   ACS: Yes.    Non-ACS:    Anginal Classification: CCS IV  Anti-ischemic medical therapy: Minimal Therapy (1 class of medications)  Non-Invasive Test Results: No non-invasive testing performed  Prior CABG: Previous CABG   Robert [name]  has presented today for surgery, with the diagnosis of chest pain.  The various methods of treatment have been discussed with the patient and family. After consideration of risks, benefits and other options for treatment, the patient has consented to  Procedure(s): LEFT HEART CATH AND CORONARY ANGIOGRAPHY (N/A) as a surgical intervention.  The patient's history has been reviewed, patient examined, no change in status, stable for surgery.  I have reviewed the patient's chart and labs.  Questions were answered to the patient's satisfaction.     Sherren Mocha

## 2020-08-28 NOTE — ED Notes (Signed)
Cardiology to bedside. 

## 2020-08-28 NOTE — ED Provider Notes (Signed)
[facility] EMERGENCY DEPARTMENT Provider Note   CSN: [id number] Arrival date & time: 08/28/20  1914     History Chief Complaint  Patient presents with  . Chest Pain    Robert [name] is a 74 y.o. male.  HPI  HPI: A 74 year old patient with a history of hypertension, hypercholesterolemia and obesity presents for evaluation of chest pain. Initial onset of pain was approximately 3-6 hours ago. The patient's chest pain is described as heaviness/pressure/tightness, is sharp, is not worse with exertion and is relieved by nitroglycerin. The patient's chest pain is middle- or left-sided, is not well-localized and does radiate to the arms/jaw/neck. The patient does not complain of nausea and denies diaphoresis. The patient has no history of stroke, has no history of peripheral artery disease, has not smoked in the past 90 days, denies any history of treated diabetes and has no relevant family history of coronary artery disease (first degree relative at less than age 74).   74 year old male history of coronary artery disease, status post CABG, status post stents presents today complaining of chest pain.  Patient reports that over the past several evenings he has had some chest discomfort when he walks around the house which resolves with rest.  He has not noted this during the day.  He has had some associated dyspnea and high blood pressure with this.  Last night he was woken from sleep at 3 AM with sharp left-sided chest pain.  He describes this is like his prior cardiac pain.  He took 3 nitroglycerin and the pain resolved.  Then returned at 4 AM.  He reports that it did not resolve after this and he eventually called 911.  EMS gave him 3 sublingual nitro in route.  Pain decreased from 9-4.  Patient had aspirin at home.  He reports taking medications as prescribed with the exception that he is on Imdur and he takes this in the morning.  The home medication list list this is being  taken twice daily.  He reports having all 3 Covid vaccines.  He denies any nasal congestion, cough, sore throat, fever, chills, nausea, vomiting, or diarrhea.  Patient lives in his own home with his wife.  He reports that he golfs once a week.  He has not golfed this week.  He does not report having any problems when he has been out golfing.  He did go out yesterday and had lunch with friends. Primary care is Dr. Cathlean Cower Cardiologist is Dr. Burt Knack.  He reports that he was seen by Dr. Burt Knack 2 weeks ago andNo new problems were identified. Past Medical History:  Diagnosis Date  . ALLERGIC RHINITIS 08/20/2007  . Anemia   . Arthritis    "back; knees; shoulders"  (06/08/2017)  . Asthma    "very slight"   . Barrett esophagus   . BENIGN PROSTATIC HYPERTROPHY 08/20/2007  . Coronary artery disease involving native heart with angina pectoris ([place])    a. s/p CABG x 3 in 1989;  b. MI in 1995 with BMS to VG->RCA;  c. 07/2000 PCI/BMS to native LCX;  d. Known CTO of VG->Diag;  e. Mult caths w/ stable anatomy;  f. 04/2013 Cath: LM nl, ostLAD 100, LCX 10%isr, 85m, OM1/2/3 min irregs, RI min irregs, pRCA 80-90%/ 100& mid, VG->RCA ostial Stent w/ 20-30isr, LIMA->LAD ok, Occluded VG-Diag. EF 60%-->Med Rx.  . ELBOW PAIN, LEFT 04/15/2008  . ERECTILE DYSFUNCTION 08/20/2007  . Gallbladder polyp 08/27/2015   Noted on [place]  Abd  . GERD (gastroesophageal reflux disease)   . Hepatic cyst 09/06/2015   Large simple right hepatic lobe, noted on [place] Abd  . History of cardiomegaly 05/15/2013   Noted on CXR  . History of meningitis    2nd grade, 8th grade, 9th grade, age 54  . HYPERLIPIDEMIA TYPE I / IV 09/03/2008  . HYPERTENSION    "been on RX since CABG but never a problem" (06/08/2017)  . INSOMNIA-SLEEP DISORDER-UNSPEC 03/17/2008  . Microcytosis   . Myocardial infarction [facility]) June 1995  . OSA on CPAP   . PREMATURE VENTRICULAR CONTRACTIONS 08/20/2007  . SCROTAL MASS 10/09/2008  . URINARY INCONTINENCE, MALE 10/09/2008  .  UTI 04/24/2009   "just this once" (05/15/2013)    Patient Active Problem List   Diagnosis Date Noted  . OA (osteoarthritis) of knee 07/01/2019  . Acute hearing loss, right 09/21/2018  . Finger laceration 10/09/2017  . Cough 10/17/2016  . Right-sided chest pain 10/11/2016  . Corn of foot 06/10/2016  . Chest pain 04/01/2016  . Coronary artery disease involving native heart with angina pectoris ([place])   . S/P CABG x 3   . Hypertensive heart disease with heart failure ([place])   . Angina pectoris ([place])   . Hearing loss of both ears 05/21/2015  . Rash 05/21/2015  . Rash and nonspecific skin eruption 09/24/2014  . Spinal stenosis, lumbar region, with neurogenic claudication 09/18/2013  . Unstable angina ([place]) 05/16/2013  . OSA (obstructive sleep apnea) 05/16/2013  . Morbid obesity ([place]) 05/16/2013  . Barrett's esophagus 09/12/2011  . Iron deficiency anemia, unspecified 08/24/2011  . Long term current use of Plavix and aspirin due to coronary artery disease 08/19/2011  . Personal history of adenomatous colonic polyps 04/30/2011  . Impaired glucose tolerance 04/29/2011  . Microcytosis 04/29/2011  . Preventative health care 04/29/2011  . SCROTAL MASS 10/09/2008  . URINARY INCONTINENCE, MALE 10/09/2008  . HYPERLIPIDEMIA TYPE I / IV 09/03/2008  . ELBOW PAIN, LEFT 04/15/2008  . INSOMNIA-SLEEP DISORDER-UNSPEC 03/17/2008  . Hyperlipidemia type IV 08/20/2007  . ERECTILE DYSFUNCTION 08/20/2007  . Essential hypertension 08/20/2007  . PREMATURE VENTRICULAR CONTRACTIONS 08/20/2007  . ALLERGIC RHINITIS 08/20/2007  . BENIGN PROSTATIC HYPERTROPHY 08/20/2007  . LOW BACK PAIN 08/20/2007    Past Surgical History:  Procedure Laterality Date  . ANKLE FRACTURE SURGERY Right 1984  . BACK SURGERY    . CARDIAC CATHETERIZATION  X 2  . CARDIAC CATHETERIZATION N/A 04/01/2016   Procedure: Left Heart Cath and Cors/Grafts Angiography;  Surgeon: Leonie Man, MD;  Location: [place] [facility];  Service:  Cardiovascular;  Laterality: N/A;  . CARDIAC CATHETERIZATION N/A 04/01/2016   Procedure: Intravascular Pressure Wire/FFR Study;  Surgeon: Leonie Man, MD;  Location: [place] [facility];  Service: Cardiovascular;  Laterality: N/A;  . CARDIAC CATHETERIZATION N/A 04/01/2016   Procedure: Coronary Stent Intervention;  Surgeon: Leonie Man, MD;  Location: [place] [facility];  Service: Cardiovascular;  Laterality: N/A;  . CARDIAC CATHETERIZATION  06/08/2017  . COLONOSCOPY  08/31/2011  . COLONOSCOPY W/ BIOPSIES AND POLYPECTOMY  11/20/2003   adenomatous polyp, internal and external hemorrhoids  . CORONARY ANGIOPLASTY  95, 98, 2000, 2001  . [place]- 04/01/2016   "total of 4 stents counting today" (04/01/2016)  . CORONARY ARTERY BYPASS GRAFT  1989   triple bypass [facility]  . CYSTOSCOPY  08/07/2009   and right hydrocelectomy  . FOOT TENDON SURGERY Left 2012   "donor tendon"   .  HEMI-MICRODISCECTOMY LUMBAR LAMINECTOMY LEVEL 1 Right 09/18/2013   Procedure: CENTRAL DECOMPRESSION @ L4-5 FOR SPINAL STENOSIS. EXCISION OF SYNOVIAL CYST @ L4-5 RIGHT, MICRODISCECTOMY @L4 -5 RIGHT ;  Surgeon: Tobi Bastos, MD;  Location: [facility];  Service: Orthopedics;  Laterality: Right;  . INGUINAL HERNIA REPAIR Bilateral 1970's   "2 on one side; 1 on the other"   . KNEE ARTHROSCOPY WITH MEDIAL MENISECTOMY Left 09/18/2018   Procedure: DIAGNOSTIC KNEE ARTHROSCOPY WITH MEDIAL MENISECTOMY;  Surgeon: Latanya Maudlin, MD;  Location: [facility];  Service: Orthopedics;  Laterality: Left;  30min  . LEFT HEART CATH AND CORS/GRAFTS ANGIOGRAPHY N/A 06/08/2017   Procedure: LEFT HEART CATH AND CORS/GRAFTS ANGIOGRAPHY;  Surgeon: Lorretta Harp, MD;  Location: [place] [facility];  Service: Cardiovascular;  Laterality: N/A;  . LEFT HEART CATHETERIZATION WITH CORONARY ANGIOGRAM Bilateral 05/15/2013   Procedure: LEFT HEART CATHETERIZATION WITH CORONARY ANGIOGRAM;  Surgeon: Wellington Hampshire, MD;  Location:  [place] [facility];  Service: Cardiovascular;  Laterality: Bilateral;  . SHOULDER OPEN ROTATOR CUFF REPAIR Right 2009   [place]  . SURGERY SCROTAL / TESTICULAR Right 2012   "removed the sac" (05/15/2013)  . TONSILLECTOMY AND ADENOIDECTOMY  ~1961  . TOTAL KNEE ARTHROPLASTY Left 07/01/2019   Procedure: LEFT TOTAL KNEE ARTHROPLASTY;  Surgeon: Gaynelle Arabian, MD;  Location: [facility];  Service: Orthopedics;  Laterality: Left;  21min  . TRANSURETHRAL RESECTION OF PROSTATE  10/02/2009   [place] transurethral resection   . UPPER GI ENDOSCOPY  09/06/2011       Family History  Problem Relation Age of Onset  . Coronary artery disease Father   . Coronary artery disease Mother        pacemaker  . Heart disease Brother   . Esophageal cancer Brother     Social History   Tobacco Use  . Smoking status: Former Smoker    Packs/day: 1.00    Years: 45.00    Pack years: 45.00    Types: Cigarettes    Quit date: 04/23/1994    Years since quitting: 26.3  . Smokeless tobacco: Never Used  Vaping Use  . Vaping Use: Never used  Substance Use Topics  . Alcohol use: Not Currently    Comment:  "I've  quit drinking in 1989"  . Drug use: No    Home Medications Prior to Admission medications   Medication Sig Start Date End Date Taking? Authorizing Provider  albuterol (PROAIR HFA) 108 (90 BASE) MCG/ACT inhaler Inhale 1-2 puffs into the lungs every 6 (six) hours as needed for wheezing or shortness of breath.     [provider]  amitriptyline (ELAVIL) 25 MG tablet Take 1 tablet (25 mg total) by mouth at bedtime. Reported on 01/25/2016 04/07/17   Biagio Borg, MD  amoxicillin (AMOXIL) 500 MG capsule amoxicillin 500 mg capsule  TAKE FOUR CAPSULES BY MOUTH ONE HOUR BEFORE APPOINTMENT    [provider]  atenolol (TENORMIN) 25 MG tablet TAKE 1 TABLET AT BEDTIME 12/31/18   Biagio Borg, MD  atorvastatin (LIPITOR) 40 MG tablet TAKE 1 TABLET (40 MG TOTAL) BY MOUTH DAILY. 02/20/20 05/20/20  Sherren Mocha, MD   clopidogrel (PLAVIX) 75 MG tablet TAKE 1 TABLET (75 MG TOTAL) BY MOUTH DAILY. 11/04/15   Biagio Borg, MD  isosorbide mononitrate (IMDUR) 30 MG 24 hr tablet TAKE 1 TABLET TWICE DAILY 06/21/19   Richardson Dopp T, PA-C  lisinopril (ZESTRIL) 10 MG tablet TAKE 1 TABLET AT BEDTIME 07/01/19   Biagio Borg, MD  nitroGLYCERIN (NITROSTAT) 0.4 MG SL tablet Place 1 tablet (0.4 mg total) under the tongue every 5 (five) minutes as needed for chest pain. 07/18/18   Dunn, Nedra Hai, PA-C  omeprazole (PRILOSEC) 20 MG capsule Take 20 mg by mouth daily.    [provider]  sennosides-docusate sodium (SENOKOT-S) 8.6-50 MG tablet Take 1 tablet by mouth at bedtime.    [provider]  simvastatin (ZOCOR) 40 MG tablet  07/07/20   [provider]  temazepam (RESTORIL) 30 MG capsule TAKE 1 CAPSULE BY MOUTH AT BEDTIME AS NEEDED FOR SLEEP 03/09/20   Biagio Borg, MD  Vitamin D, Ergocalciferol, (DRISDOL) 1.25 MG (50000 UT) CAPS capsule Take 1 capsule (50,000 Units total) by mouth every 7 (seven) days. 08/29/19   Biagio Borg, MD    Allergies    Metoprolol, Lipitor [atorvastatin], and Adhesive [tape]  Review of Systems   Review of Systems  Constitutional: Negative.   HENT: Negative.   Eyes: Negative.   Respiratory: Positive for shortness of breath.        Shortness of breath as per HPI with chest pain  Gastrointestinal: Negative.   Endocrine: Negative.   Genitourinary: Negative.   Musculoskeletal:       Patient had knee replacement last year.  He currently wears a brace on the left ankle due to ligamentous injuries.  Skin: Negative.        Denies any pain swelling or rash in the lower extremities.  Allergic/Immunologic: Negative.   Neurological: Negative.   Hematological: Negative.   Psychiatric/Behavioral: Negative.   All other systems reviewed and are negative.   Physical Exam Updated Vital Signs BP (!) 134/91   Pulse 62   Temp 98.3 F (36.8 C) (Oral)   Resp 20   Ht 1.829 m  (6')   Wt 108.9 kg   SpO2 97%   BMI 32.55 kg/m   Physical Exam Vitals and nursing note reviewed.  Constitutional:      General: He is not in acute distress.    Appearance: He is well-developed. He is obese. He is not ill-appearing.  HENT:     Head: Normocephalic.  Eyes:     Pupils: Pupils are equal, round, and reactive to light.  Cardiovascular:     Rate and Rhythm: Normal rate and regular rhythm.     Heart sounds: Normal heart sounds.  Pulmonary:     Effort: Pulmonary effort is normal.     Breath sounds: Normal breath sounds.  Abdominal:     General: Bowel sounds are normal.     Palpations: Abdomen is soft.  Musculoskeletal:        General: Normal range of motion.     Cervical back: Normal range of motion.     Right lower leg: No tenderness. No edema.     Left lower leg: No tenderness. No edema.  Skin:    General: Skin is warm.     Capillary Refill: Capillary refill takes less than 2 seconds.  Neurological:     General: No focal deficit present.     Mental Status: He is alert and oriented to person, place, and time.     Cranial Nerves: No cranial nerve deficit.     Motor: No weakness.  Psychiatric:        Mood and Affect: Mood normal.        Behavior: Behavior normal.     ED Results / Procedures / Treatments   Labs (all labs ordered are listed,  but only abnormal results are displayed) Labs Reviewed - No data to display  EKG None  Radiology No results found.  Procedures .Critical Care Performed by: Pattricia Boss, MD Authorized by: Pattricia Boss, MD   Critical care provider statement:    Critical care time (minutes):  45   Critical care was necessary to treat or prevent imminent or life-threatening deterioration of the following conditions:  Cardiac failure   Critical care was time spent personally by me on the following activities:  Discussions with consultants, evaluation of patient's response to treatment, examination of patient, ordering and performing  treatments and interventions, ordering and review of laboratory studies, ordering and review of radiographic studies, pulse oximetry, re-evaluation of patient's condition, obtaining history from patient or surrogate and review of old charts   (including critical care time)  Medications Ordered in ED Medications - No data to display  ED Course  I have reviewed the triage vital signs and the nursing notes.  Pertinent labs & imaging results that were available during my care of the patient were reviewed by me and considered in my medical decision making (see chart for details).    MDM Rules/Calculators/A&P HEAR Score: 6                       9:56 AM First troponin elevated at 33 Patient had heparin and nitro started Discussed with Trish and cardiology will see Pain at 2/10 1:01 PM Reviewed cardiology note Patient remains with decreased pain hemodynamically stable. Final Clinical Impression(s) / ED Diagnoses Final diagnoses:  Chest pain  NSTEMI (non-ST elevated myocardial infarction) [facility])    Rx / DC Orders ED Discharge Orders    None       Pattricia Boss, MD 08/28/20 1537

## 2020-08-28 NOTE — H&P (Addendum)
Cardiology Admission History and Physical:   Patient ID: Robert [name] MRN: [id number]; DOB: 25-Dec-1945   Admission date: 08/28/2020  Primary Care Provider: Biagio Borg, MD [facility] Cardiologist: Sherren Mocha, MD  [place] Electrophysiologist:  None   Chief Complaint:  Chest pain  Patient Profile:   Robert [name] is a 74 y.o. male with a history of CAD s/p CABG (1989) with subsequent PCIs, HTN, HLD, PVCs, and GERD.   History of Present Illness:   Robert [name] has a history of CAD dating back to 1989 with CABG x 3. He had MI in 1995 treated with BMS to SVG-RCA, 07/2000 had PCI with BMS to native LCx and known CTO of SVG-Diagonal. He had multiple heart catheterizations with stable anatomy. Last intervention was 207 with DES to OM2.  Last heart cath in September 2018 showed patent Cx stent and patent RCA stent with 40-50% ISR stable from cath in 2017. LIMA-LAD patent. He does receive care through the [place]. He was recently seen by Dr. Burt Knack 08/05/20 and was doing well on plavix, atenolol, and high intensity statin.   Unfortunately, he presented to [facility] with worsening chest pain this morning. He reports exertional chest pain for the last 4-5 days. In the evenings after walking to the car or doing a chore, he would experience chest pain in his center-left chest that radiated down his left arm rated 5/10. CP generally relieved with rest. He does note that these episodes were associated with hypertension/hypertensive urgency with SBP in the 170s. He is compliant on medications. The last two evenings, he took nitro 1-3 tablets (cumbled) to relieve his chest pain. He went to bed last night but chest pain 9/10 woke him from sleep at 0245 this morning associated with left arm radiation and shortness of breath. He denies diaphoresis and N/V. He took three nitro and was able to shower. He ate cereal at 3AM but woke his wife stating he needed to come to the ER and EMS was dispatched.  EMS gave him an additional 2 nitro.   HS troponin 33 --> 103 Heparin gtt and nitro gtt running EKG sinus HR 61, old inferior infarct  He also tells me his brother died several years ago around christmas time. His brother had also had CABG and died [place] the [place]. Robert [name] was supposed to golf today.    Past Medical History:  Diagnosis Date  . ALLERGIC RHINITIS 08/20/2007  . Anemia   . Arthritis    "back; knees; shoulders"  (06/08/2017)  . Asthma    "very slight"   . Barrett esophagus   . BENIGN PROSTATIC HYPERTROPHY 08/20/2007  . Coronary artery disease involving native heart with angina pectoris ([place])    a. s/p CABG x 3 in 1989;  b. MI in 1995 with BMS to VG->RCA;  c. 07/2000 PCI/BMS to native LCX;  d. Known CTO of VG->Diag;  e. Mult caths w/ stable anatomy;  f. 04/2013 Cath: LM nl, ostLAD 100, LCX 10%isr, 55m, OM1/2/3 min irregs, RI min irregs, pRCA 80-90%/ 100& mid, VG->RCA ostial Stent w/ 20-30isr, LIMA->LAD ok, Occluded VG-Diag. EF 60%-->Med Rx.  . ELBOW PAIN, LEFT 04/15/2008  . ERECTILE DYSFUNCTION 08/20/2007  . Gallbladder polyp 08/27/2015   Noted on [place] Abd  . GERD (gastroesophageal reflux disease)   . Hepatic cyst 09/06/2015   Large simple right hepatic lobe, noted on [place] Abd  . History of cardiomegaly 05/15/2013   Noted on CXR  . History of meningitis  2nd grade, 8th grade, 9th grade, age 88  . HYPERLIPIDEMIA TYPE I / IV 09/03/2008  . HYPERTENSION    "been on RX since CABG but never a problem" (06/08/2017)  . INSOMNIA-SLEEP DISORDER-UNSPEC 03/17/2008  . Microcytosis   . Myocardial infarction [facility]) June 1995  . OSA on CPAP   . PREMATURE VENTRICULAR CONTRACTIONS 08/20/2007  . SCROTAL MASS 10/09/2008  . URINARY INCONTINENCE, MALE 10/09/2008  . UTI 04/24/2009   "just this once" (05/15/2013)    Past Surgical History:  Procedure Laterality Date  . ANKLE FRACTURE SURGERY Right 1984  . BACK SURGERY    . CARDIAC CATHETERIZATION  X 2  . CARDIAC CATHETERIZATION N/A  04/01/2016   Procedure: Left Heart Cath and Cors/Grafts Angiography;  Surgeon: Leonie Man, MD;  Location: [place] CV LAB;  Service: Cardiovascular;  Laterality: N/A;  . CARDIAC CATHETERIZATION N/A 04/01/2016   Procedure: Intravascular Pressure Wire/FFR Study;  Surgeon: Leonie Man, MD;  Location: [place] CV LAB;  Service: Cardiovascular;  Laterality: N/A;  . CARDIAC CATHETERIZATION N/A 04/01/2016   Procedure: Coronary Stent Intervention;  Surgeon: Leonie Man, MD;  Location: [place] CV LAB;  Service: Cardiovascular;  Laterality: N/A;  . CARDIAC CATHETERIZATION  06/08/2017  . COLONOSCOPY  08/31/2011  . COLONOSCOPY W/ BIOPSIES AND POLYPECTOMY  11/20/2003   adenomatous polyp, internal and external hemorrhoids  . CORONARY ANGIOPLASTY  95, 98, 2000, 2001  . [place]- 04/01/2016   "total of 4 stents counting today" (04/01/2016)  . CORONARY ARTERY BYPASS GRAFT  1989   triple bypass MUSC  . CYSTOSCOPY  08/07/2009   and right hydrocelectomy  . FOOT TENDON SURGERY Left 2012   "donor tendon"   . HEMI-MICRODISCECTOMY LUMBAR LAMINECTOMY LEVEL 1 Right 09/18/2013   Procedure: CENTRAL DECOMPRESSION @ L4-5 FOR SPINAL STENOSIS. EXCISION OF SYNOVIAL CYST @ L4-5 RIGHT, MICRODISCECTOMY @L4 -5 RIGHT ;  Surgeon: Tobi Bastos, MD;  Location: [facility];  Service: Orthopedics;  Laterality: Right;  . INGUINAL HERNIA REPAIR Bilateral 1970's   "2 on one side; 1 on the other"   . KNEE ARTHROSCOPY WITH MEDIAL MENISECTOMY Left 09/18/2018   Procedure: DIAGNOSTIC KNEE ARTHROSCOPY WITH MEDIAL MENISECTOMY;  Surgeon: Latanya Maudlin, MD;  Location: [facility];  Service: Orthopedics;  Laterality: Left;  75min  . LEFT HEART CATH AND CORS/GRAFTS ANGIOGRAPHY N/A 06/08/2017   Procedure: LEFT HEART CATH AND CORS/GRAFTS ANGIOGRAPHY;  Surgeon: Lorretta Harp, MD;  Location: [place] [facility];  Service: Cardiovascular;  Laterality: N/A;  . LEFT HEART CATHETERIZATION WITH CORONARY  ANGIOGRAM Bilateral 05/15/2013   Procedure: LEFT HEART CATHETERIZATION WITH CORONARY ANGIOGRAM;  Surgeon: Wellington Hampshire, MD;  Location: [place] C[facility];  Service: Cardiovascular;  Laterality: Bilateral;  . SHOULDER OPEN ROTATOR CUFF REPAIR Right 2009   [place]  . SURGERY SCROTAL / TESTICULAR Right 2012   "removed the sac" (05/15/2013)  . TONSILLECTOMY AND ADENOIDECTOMY  ~1961  . TOTAL KNEE ARTHROPLASTY Left 07/01/2019   Procedure: LEFT TOTAL KNEE ARTHROPLASTY;  Surgeon: Gaynelle Arabian, MD;  Location: [facility];  Service: Orthopedics;  Laterality: Left;  3min  . TRANSURETHRAL RESECTION OF PROSTATE  10/02/2009   [place] transurethral resection   . UPPER GI ENDOSCOPY  09/06/2011     Medications Prior to Admission: Prior to Admission medications   Medication Sig Start Date End Date Taking? Authorizing Provider  albuterol (PROAIR HFA) 108 (90 BASE) MCG/ACT inhaler Inhale 1-2 puffs into the lungs every 6 (six) hours as needed for  wheezing or shortness of breath.     [provider]  amitriptyline (ELAVIL) 25 MG tablet Take 1 tablet (25 mg total) by mouth at bedtime. Reported on 01/25/2016 04/07/17   Biagio Borg, MD  amoxicillin (AMOXIL) 500 MG capsule amoxicillin 500 mg capsule  TAKE FOUR CAPSULES BY MOUTH ONE HOUR BEFORE APPOINTMENT    [provider]  atenolol (TENORMIN) 25 MG tablet TAKE 1 TABLET AT BEDTIME 12/31/18   Biagio Borg, MD  atorvastatin (LIPITOR) 40 MG tablet TAKE 1 TABLET (40 MG TOTAL) BY MOUTH DAILY. 02/20/20 05/20/20  Sherren Mocha, MD  clopidogrel (PLAVIX) 75 MG tablet TAKE 1 TABLET (75 MG TOTAL) BY MOUTH DAILY. 11/04/15   Biagio Borg, MD  isosorbide mononitrate (IMDUR) 30 MG 24 hr tablet TAKE 1 TABLET TWICE DAILY 06/21/19   Richardson Dopp T, PA-C  lisinopril (ZESTRIL) 10 MG tablet TAKE 1 TABLET AT BEDTIME 07/01/19   Biagio Borg, MD  nitroGLYCERIN (NITROSTAT) 0.4 MG SL tablet Place 1 tablet (0.4 mg total) under the tongue every 5 (five) minutes as needed for chest pain.  07/18/18   Dunn, Nedra Hai, PA-C  omeprazole (PRILOSEC) 20 MG capsule Take 20 mg by mouth daily.    [provider]  sennosides-docusate sodium (SENOKOT-S) 8.6-50 MG tablet Take 1 tablet by mouth at bedtime.    [provider]  simvastatin (ZOCOR) 40 MG tablet  07/07/20   [provider]  temazepam (RESTORIL) 30 MG capsule TAKE 1 CAPSULE BY MOUTH AT BEDTIME AS NEEDED FOR SLEEP 03/09/20   Biagio Borg, MD  Vitamin D, Ergocalciferol, (DRISDOL) 1.25 MG (50000 UT) CAPS capsule Take 1 capsule (50,000 Units total) by mouth every 7 (seven) days. 08/29/19   Biagio Borg, MD     Allergies:    Allergies  Allergen Reactions  . Metoprolol Other (See Comments)    severe bradycardia  . Lipitor [Atorvastatin] Other (See Comments)    Worsening reflux  . Adhesive [Tape] Rash    Social History:   Social History   Socioeconomic History  . Marital status: Married    Spouse name: Not on file  . Number of children: 1  . Years of education: Not on file  . Highest education level: Not on file  Occupational History  . Occupation: retired Holiday representative [employer]    Comment: part time golf course  Tobacco Use  . Smoking status: Former Smoker    Packs/day: 1.00    Years: 45.00    Pack years: 45.00    Types: Cigarettes    Quit date: 04/23/1994    Years since quitting: 26.3  . Smokeless tobacco: Never Used  Vaping Use  . Vaping Use: Never used  Substance and Sexual Activity  . Alcohol use: Not Currently    Comment:  "I've  quit drinking in 1989"  . Drug use: No  . Sexual activity: Yes  Other Topics Concern  . Not on file  Social History Narrative  . Not on file   Social Determinants of Health   Financial Resource Strain: Not on file  Food Insecurity: Not on file  Transportation Needs: Not on file  Physical Activity: Not on file  Stress: Not on file  Social Connections: Not on file  Intimate Partner Violence: Not on file    Family History:   The patient's  family history includes Coronary artery disease in his father and mother; Esophageal cancer in his brother; Heart disease in his brother.  ROS:  Please see the history of present illness.  All other ROS reviewed and negative.     Physical Exam/Data:   Vitals:   08/28/20 1130 08/28/20 1200 08/28/20 1230 08/28/20 1300  BP: 113/73 115/73 109/76 136/72  Pulse: (!) 56 (!) 52 (!) 53 (!) 56  Resp: 15 13 11 16   Temp:      TempSrc:      SpO2: 98% 95% 96% 99%  Weight:      Height:       No intake or output data in the 24 hours ending 08/28/20 1325 Last 3 Weights 08/28/2020 08/05/2020 08/29/2019  Weight (lbs) 240 lb 245 lb 9.6 oz 236 lb  Weight (kg) 108.863 kg 111.403 kg 107.049 kg     Body mass index is 32.55 kg/m.  General:  Well nourished, well developed, in no acute distress HEENT: normal Lymph: no adenopathy Neck: no JVD Endocrine:  No thryomegaly Vascular: No carotid bruits; FA pulses 2+ bilaterally without bruits  Cardiac:  normal S1, S2; RRR; no murmur  Lungs:  clear to auscultation bilaterally, no wheezing, rhonchi or rales  Abd: soft, nontender, no hepatomegaly  Ext: no edema Musculoskeletal:  No deformities, BUE and BLE strength normal and equal Skin: warm and dry  Neuro:  CNs 2-12 intact, no focal abnormalities noted Psych:  Normal affect    EKG:  The ECG that was done was personally reviewed and demonstrates sinus rhythm HR 61, old inferior infarct  Relevant CV Studies:  Left heart cath 05/2017: IMPRESSION: Robert [name] anatomy is unchanged from his cath last year. The circumflex obtuse marginal branch stent is widely patent. The RCA graft is widely patent as well as the stent in its origin with at most 40-50% "in-stent restenosis unchanged from prior cath. The diagonal vein graft was not visualized since it was known to be occluded in the past. The LIMA was large patent and filled the LAD with excellent filling of the diagonal branches. LV function was normal.  There are no culprit lesions to explain his symptoms. His groin was sealed with a MYNX device and he left the lab in stable condition.   Left heart cath 03/2016: 1. Not listed procedures: Fractional flow reserve measurement, Coronary stent intervention 2. 2nd Mrg lesion, 75% stenosed. Post FFR guided intervention with Promus DES 2.5 mm x 12 mm (2.7 mm), there is a 0% residual stenosis. 3. Widely patent proximal circumflex stent. 4. Ost LAD lesion, 100% stenosed. 5. Widely patent tortuous LIMA-dLAD 6. Prox LAD lesion, 95% stenosed. Mid LAD lesion, 80% stenosed. Both lesions are between D1 and D2 seen via retrograde flow. 7. Ost RCA lesion, 90% stenosed. Mid RCA to Dist RCA lesion, 100% stenosed. - Known occlusion 8. Ostial SVG-RCA stent has roughly 45% ISR. The remainder the graft is patent with minimal disease to a small distal RCA that gives off PDA and PL branches. segment. 9. SVG-Diag was not injected - known to be flush occluded. The nongrafted diagonal branches clearly have compromised flow from retrograde perfusion of the graft LAD 10. Preserved LVEF with normal LVEDP   Angiographically similar coronary artery disease as seen in 2014, however in one view the lesion in the OM 2. To be more significant. Due to his ongoing symptoms of exertional angina, a FFR was performed indicating FFR of 0.78 that is physiologically significant. Therefore decision was made to proceed with PCI.  Plan:  Overnight admission to post procedure unit. Expected discharge tomorrow. Standard TR band removal.  Continue  home dose of Plavix and add back aspirin  Can you other cardiac medications   Diagnostic Dominance: Right    Intervention      Laboratory Data:  High Sensitivity Troponin:   Recent Labs  Lab 08/28/20 0729 08/28/20 0936  TROPONINIHS 33* 103*      Chemistry Recent Labs  Lab 08/28/20 0729  NA 137  K 4.5  CL 105  CO2 23  GLUCOSE 112*  BUN 18  CREATININE 0.89  CALCIUM  8.3*  GFRNONAA >60  ANIONGAP 9    Recent Labs  Lab 08/28/20 0729  PROT 6.3*  ALBUMIN 3.5  AST 27  ALT 22  ALKPHOS 88  BILITOT 0.3   Hematology Recent Labs  Lab 08/28/20 0729  WBC 5.7  RBC 5.85*  HGB 13.4  HCT 44.6  MCV 76.2*  MCH 22.9*  MCHC 30.0  RDW 16.0*  PLT 223   BNPNo results for input(s): BNP, PROBNP in the last 168 hours.  DDimer No results for input(s): DDIMER in the last 168 hours.   Radiology/Studies:  DG Chest 1 View  Result Date: 08/28/2020 CLINICAL DATA:  Chest pain EXAM: CHEST  1 VIEW COMPARISON:  06/08/2017 FINDINGS: Cardiomegaly. Prior median sternotomy. There is no edema, consolidation, effusion, or pneumothorax. Artifact from EKG leads. IMPRESSION: No evidence of active disease. Electronically Signed   By: Monte Fantasia M.D.   On: 08/28/2020 07:58     Assessment and Plan:   Chest pain CAD s/p CABG x 3 (1989) and subsequent PCI - last heart cath 2018 with patent stents (RCA, LCx) and patent LIMA - pt presented with progression of chest pain describing exertional symptoms for the last several days, but woke him from sleep this morning - pt received SL nitro x 5 and is now on nitro gtt with relief - hs troponin 33--> 103 - EKG appears stable, no evidence of new ischemia - continue plavix - continue atenolol - continue heparin drip and nitro drip   Hypertension - continue imdur, lisinopril, and atenolol   Hyperlipidemia with LDL goal < 70 - need updated lipid panel - continue 40 mg simvastatin   Will admit to cardiology service with plans for definitive angiography this afternoon. Keep NPO prior to cath.       HEAR Score (for undifferentiated chest pain):  HEAR Score: 6       Severity of Illness: The appropriate patient status for this patient is INPATIENT. Inpatient status is judged to be reasonable and necessary in order to provide the required intensity of service to ensure the patient's safety. The patient's presenting  symptoms, physical exam findings, and initial radiographic and laboratory data in the context of their chronic comorbidities is felt to place them at high risk for further clinical deterioration. Furthermore, it is not anticipated that the patient will be medically stable for discharge from the hospital within 2 midnights of admission. The following factors support the patient status of inpatient.   " The patient's presenting symptoms include chest pain. " The worrisome physical exam findings include elevated troponin. " The initial radiographic and laboratory data are worrisome because of inferior infarct (old) on EKG " The chronic co-morbidities include CAD.   * I certify that at the point of admission it is my clinical judgment that the patient will require inpatient hospital care spanning beyond 2 midnights from the point of admission due to high intensity of service, high risk for further deterioration and high frequency of surveillance required.*  For questions or updates, please contact [place] Please consult [email or web address] for contact info under     Signed, Ledora Bottcher, PA  08/28/2020 1:25 PM   I have personally seen and examined this patient. I agree with the assessment and plan as outlined above.  Robert [name] is a 74 yo male with history of CAD s/p CABG and subsequent PCI, HTN, HLD, PVCs and GERD who is being admitted with chest pain c/w unstable angina. Troponin is elevated. EKG without ischemic changes.  He has had chest pain for several days that is occurring both at rest and with exertion.  His pain feels like his prior angina.  Labs reviewed by me. I have personally reviewed the EKG and it shows sinus with non-specific ST abnormalities.   My exam:  General: Well developed, well nourished, NAD  HEENT: OP clear, mucus membranes moist  SKIN: warm, dry. No rashes. Neuro: No focal deficits  Musculoskeletal: Muscle strength 5/5 all ext  Psychiatric: Mood and  affect normal  Neck: No JVD, no carotid bruits, no thyromegaly, no lymphadenopathy.  Lungs:Clear bilaterally, no wheezes, rhonci, crackles Cardiovascular: Regular rate and rhythm. No murmurs, gallops or rubs. Abdomen:Soft. Bowel sounds present. Non-tender.  Extremities: No lower extremity edema. Pulses are 2 + in the bilateral DP/PT.  Plan: He is presenting with worrisome symptoms that are similar to his prior angina. Given recent acceleration of his symptoms, this is concerning for unstable angina. His troponin is elevated. I think a cardiac cath is indicated. He agrees to this.  I have reviewed the risks, indications, and alternatives to cardiac catheterization, possible angioplasty, and stenting with the patient. Risks include but are not limited to bleeding, infection, vascular injury, stroke, myocardial infection, arrhythmia, kidney injury, radiation-related injury in the case of prolonged fluoroscopy use, emergency cardiac surgery, and death. The patient understands the risks of serious complication is 1-2 in 2248 with diagnostic cardiac cath and 1-2% or less with angioplasty/stenting. -Cardiac cath with probable PCI today -Continue IV heparin and IV NTG  Lauree Chandler 08/28/2020 1:55 PM

## 2020-08-29 ENCOUNTER — Other Ambulatory Visit: Payer: Self-pay | Admitting: Physician Assistant

## 2020-08-29 ENCOUNTER — Encounter (HOSPITAL_COMMUNITY): Payer: Self-pay | Admitting: Cardiovascular Disease

## 2020-08-29 ENCOUNTER — Other Ambulatory Visit: Payer: Self-pay

## 2020-08-29 DIAGNOSIS — I214 Non-ST elevation (NSTEMI) myocardial infarction: Secondary | ICD-10-CM

## 2020-08-29 LAB — BASIC METABOLIC PANEL
Anion gap: 10 (ref 5–15)
Anion gap: 7 (ref 5–15)
BUN: 13 mg/dL (ref 8–23)
BUN: 13 mg/dL (ref 8–23)
CO2: 21 mmol/L — ABNORMAL LOW (ref 22–32)
CO2: 24 mmol/L (ref 22–32)
Calcium: 8.2 mg/dL — ABNORMAL LOW (ref 8.9–10.3)
Calcium: 8.5 mg/dL — ABNORMAL LOW (ref 8.9–10.3)
Chloride: 105 mmol/L (ref 98–111)
Chloride: 103 mmol/L (ref 98–111)
Creatinine, Ser: 0.74 mg/dL (ref 0.61–1.24)
Creatinine, Ser: 0.78 mg/dL (ref 0.61–1.24)
GFR, Estimated: >60 mL/min (ref >60)
GFR, Estimated: >60 mL/min (ref >60)
Glucose, Bld: 97 mg/dL (ref 70–99)
Glucose, Bld: 103 mg/dL — ABNORMAL HIGH (ref 70–99)
Potassium: 4 mmol/L (ref 3.5–5.1)
Potassium: 4.3 mmol/L (ref 3.5–5.1)
Sodium: 136 mmol/L (ref 135–145)
Sodium: 134 mmol/L — ABNORMAL LOW (ref 135–145)

## 2020-08-29 LAB — CBC
HCT: 42.3 % (ref 39.0–52.0)
Hemoglobin: 13.6 g/dL (ref 13.0–17.0)
MCH: 23.6 pg — ABNORMAL LOW (ref 26.0–34.0)
MCHC: 32.2 g/dL (ref 30.0–36.0)
MCV: 73.3 fL — ABNORMAL LOW (ref 80.0–100.0)
Platelets: 222 10*3/uL (ref 150–400)
RBC: 5.77 MIL/uL (ref 4.22–5.81)
RDW: 16.3 % — ABNORMAL HIGH (ref 11.5–15.5)
WBC: 9.4 10*3/uL (ref 4.0–10.5)
nRBC: 0 % (ref 0.0–0.2)

## 2020-08-29 LAB — LIPID PANEL
Cholesterol: 122 mg/dL (ref 0–200)
HDL: 31 mg/dL — ABNORMAL LOW (ref >40)
LDL Cholesterol: 69 mg/dL (ref 0–99)
Total CHOL/HDL Ratio: 3.9 RATIO
Triglycerides: 111 mg/dL (ref <150)
VLDL: 22 mg/dL (ref 0–40)

## 2020-08-29 MED ORDER — SIMVASTATIN 20 MG PO TABS: 20.0000 mg | ORAL_TABLET | Freq: Every evening | ORAL | 6 refills | Status: DC

## 2020-08-29 MED ORDER — LANSOPRAZOLE 30 MG PO CPDR: 30.0000 mg | DELAYED_RELEASE_CAPSULE | Freq: Every day | ORAL | 1 refills | Status: DC

## 2020-08-29 MED ORDER — SODIUM CHLORIDE 0.9 % WEIGHT BASED INFUSION: 3.0000 mL/kg/h | INTRAVENOUS | Status: AC

## 2020-08-29 MED ORDER — AMLODIPINE BESYLATE 5 MG PO TABS: 5.0000 mg | ORAL_TABLET | Freq: Every day | ORAL | 6 refills | Status: DC

## 2020-08-29 MED ORDER — SODIUM CHLORIDE 0.9% FLUSH: 3.0000 mL | Freq: Two times a day (BID) | INTRAVENOUS | Status: DC

## 2020-08-29 MED ORDER — SODIUM CHLORIDE 0.9 % WEIGHT BASED INFUSION: 1.0000 mL/kg/h | INTRAVENOUS | Status: DC

## 2020-08-29 MED ORDER — SODIUM CHLORIDE 0.9% FLUSH: 3.0000 mL | INTRAVENOUS | Status: DC | PRN

## 2020-08-29 MED ORDER — SODIUM CHLORIDE 0.9 % IV SOLN: 250.0000 mL | INTRAVENOUS | Status: DC | PRN

## 2020-08-29 MED ORDER — ASPIRIN 81 MG PO CHEW: 81.0000 mg | CHEWABLE_TABLET | ORAL | Status: DC

## 2020-08-29 MED ORDER — ASPIRIN 81 MG PO TBEC: 81.0000 mg | DELAYED_RELEASE_TABLET | Freq: Every day | ORAL | 11 refills | Status: DC

## 2020-08-29 NOTE — Discharge Instructions (Signed)
Radial Site Care  This sheet gives you information about how to care for yourself after your procedure. Your health care provider may also give you more specific instructions. If you have problems or questions, contact your health care provider. What can I expect after the procedure? After the procedure, it is common to have:  Bruising and tenderness at the catheter insertion area. Follow these instructions at home: Medicines  Take over-the-counter and prescription medicines only as told by your health care provider. Insertion site care  Follow instructions from your health care provider about how to take care of your insertion site. Make sure you: ? Wash your hands with soap and water before you change your bandage (dressing). If soap and water are not available, use hand sanitizer. ? Change your dressing as told by your health care provider. ? Leave stitches (sutures), skin glue, or adhesive strips in place. These skin closures may need to stay in place for 2 weeks or longer. If adhesive strip edges start to loosen and curl up, you may trim the loose edges. Do not remove adhesive strips completely unless your health care provider tells you to do that.  Check your insertion site every day for signs of infection. Check for: ? Redness, swelling, or pain. ? Fluid or blood. ? Pus or a bad smell. ? Warmth.  Do not take baths, swim, or use a hot tub until your health care provider approves.  You may shower 24-48 hours after the procedure, or as directed by your health care provider. ? Remove the dressing and gently wash the site with plain soap and water. ? Pat the area dry with a clean towel. ? Do not rub the site. That could cause bleeding.  Do not apply powder or lotion to the site. Activity   For 24 hours after the procedure, or as directed by your health care provider: ? Do not flex or bend the affected arm. ? Do not push or pull heavy objects with the affected arm. ? Do not  drive yourself home from the hospital or clinic. You may drive 24 hours after the procedure unless your health care provider tells you not to. ? Do not operate machinery or power tools.  Do not lift anything that is heavier than 10 lb (4.5 kg), or the limit that you are told, until your health care provider says that it is safe.  Ask your health care provider when it is okay to: ? Return to work or school. ? Resume usual physical activities or sports. ? Resume sexual activity. General instructions  If the catheter site starts to bleed, raise your arm and put firm pressure on the site. If the bleeding does not stop, get help right away. This is a medical emergency.  If you went home on the same day as your procedure, a responsible adult should be with you for the first 24 hours after you arrive home.  Keep all follow-up visits as told by your health care provider. This is important. Contact a health care provider if:  You have a fever.  You have redness, swelling, or yellow drainage around your insertion site. Get help right away if:  You have unusual pain at the radial site.  The catheter insertion area swells very fast.  The insertion area is bleeding, and the bleeding does not stop when you hold steady pressure on the area.  Your arm or hand becomes pale, cool, tingly, or numb. These symptoms may represent a serious problem   that is an emergency. Do not wait to see if the symptoms will go away. Get medical help right away. Call your local emergency services (911 in the [place] Do not drive yourself to the hospital. Summary  After the procedure, it is common to have bruising and tenderness at the site.  Follow instructions from your health care provider about how to take care of your radial site wound. Check the wound every day for signs of infection.  Do not lift anything that is heavier than 10 lb (4.5 kg), or the limit that you are told, until your health care provider says  that it is safe. This information is not intended to replace advice given to you by your health care provider. Make sure you discuss any questions you have with your health care provider. Document Revised: 10/11/2017 Document Reviewed: 10/11/2017 Elsevier Patient Education  [place] Heart-healthy meal planning includes:  Eating less unhealthy fats.  Eating more healthy fats.  Making other changes in your diet. Talk with your doctor or a diet specialist (dietitian) to create an eating plan that is right for you. What is my plan? Your doctor may recommend an eating plan that includes:  Total fat: ______% or less of total calories a day.  Saturated fat: ______% or less of total calories a day.  Cholesterol: less than _________mg a day. What are tips for following this plan? Cooking Avoid frying your food. Try to bake, boil, grill, or broil it instead. You can also reduce fat by:  Removing the skin from poultry.  Removing all visible fats from meats.  Steaming vegetables in water or broth. Meal planning   At meals, divide your plate into four equal parts: ? Fill one-half of your plate with vegetables and green salads. ? Fill one-fourth of your plate with whole grains. ? Fill one-fourth of your plate with lean protein foods.  Eat 4-5 servings of vegetables per day. A serving of vegetables is: ? 1 cup of raw or cooked vegetables. ? 2 cups of raw leafy greens.  Eat 4-5 servings of fruit per day. A serving of fruit is: ? 1 medium whole fruit. ?  cup of dried fruit. ?  cup of fresh, frozen, or canned fruit. ?  cup of 100% fruit juice.  Eat more foods that have soluble fiber. These are apples, broccoli, carrots, beans, peas, and barley. Try to get 20-30 g of fiber per day.  Eat 4-5 servings of nuts, legumes, and seeds per week: ? 1 serving of dried beans or legumes equals  cup after being cooked. ? 1 serving of nuts is   cup. ? 1 serving of seeds equals 1 tablespoon. General information  Eat more home-cooked food. Eat less restaurant, buffet, and fast food.  Limit or avoid alcohol.  Limit foods that are high in starch and sugar.  Avoid fried foods.  Lose weight if you are overweight.  Keep track of how much salt (sodium) you eat. This is important if you have high blood pressure. Ask your doctor to tell you more about this.  Try to add vegetarian meals each week. Fats  Choose healthy fats. These include olive oil and canola oil, flaxseeds, walnuts, almonds, and seeds.  Eat more omega-3 fats. These include salmon, mackerel, sardines, tuna, flaxseed oil, and ground flaxseeds. Try to eat fish at least 2 times each week.  Check food labels. Avoid foods with trans fats or high amounts of saturated fat.  Limit saturated fats. ? These are often found in animal products, such as meats, butter, and cream. ? These are also found in plant foods, such as palm oil, palm kernel oil, and coconut oil.  Avoid foods with partially hydrogenated oils in them. These have trans fats. Examples are stick margarine, some tub margarines, cookies, crackers, and other baked goods. What foods can I eat? Fruits All fresh, canned (in natural juice), or frozen fruits. Vegetables Fresh or frozen vegetables (raw, steamed, roasted, or grilled). Green salads. Grains Most grains. Choose whole wheat and whole grains most of the time. Rice and pasta, including brown rice and pastas made with whole wheat. Meats and other proteins Lean, well-trimmed beef, veal, pork, and lamb. Chicken and [place] without skin. All fish and shellfish. Wild duck, rabbit, pheasant, and venison. Egg whites or low-cholesterol egg substitutes. Dried beans, peas, lentils, and tofu. Seeds and most nuts. Dairy Low-fat or nonfat cheeses, including ricotta and mozzarella. Skim or 1% milk that is liquid, powdered, or evaporated. Buttermilk that is made with  low-fat milk. Nonfat or low-fat yogurt. Fats and oils Non-hydrogenated (trans-free) margarines. Vegetable oils, including soybean, sesame, sunflower, olive, peanut, safflower, corn, canola, and cottonseed. Salad dressings or mayonnaise made with a vegetable oil. Beverages Mineral water. Coffee and tea. Diet carbonated beverages. Sweets and desserts Sherbet, gelatin, and fruit ice. Small amounts of dark chocolate. Limit all sweets and desserts. Seasonings and condiments All seasonings and condiments. The items listed above may not be a complete list of foods and drinks you can eat. Contact a dietitian for more options. What foods should I avoid? Fruits Canned fruit in heavy syrup. Fruit in cream or butter sauce. Fried fruit. Limit coconut. Vegetables Vegetables cooked in cheese, cream, or butter sauce. Fried vegetables. Grains Breads that are made with saturated or trans fats, oils, or whole milk. Croissants. Sweet rolls. Donuts. High-fat crackers, such as cheese crackers. Meats and other proteins Fatty meats, such as hot dogs, ribs, sausage, bacon, rib-eye roast or steak. High-fat deli meats, such as salami and bologna. Caviar. Domestic duck and goose. Organ meats, such as liver. Dairy Cream, sour cream, cream cheese, and creamed cottage cheese. Whole-milk cheeses. Whole or 2% milk that is liquid, evaporated, or condensed. Whole buttermilk. Cream sauce or high-fat cheese sauce. Yogurt that is made from whole milk. Fats and oils Meat fat, or shortening. Cocoa butter, hydrogenated oils, palm oil, coconut oil, palm kernel oil. Solid fats and shortenings, including bacon fat, salt pork, lard, and butter. Nondairy cream substitutes. Salad dressings with cheese or sour cream. Beverages Regular sodas and juice drinks with added sugar. Sweets and desserts Frosting. Pudding. Cookies. Cakes. Pies. Milk chocolate or white chocolate. Buttered syrups. Full-fat ice cream or ice cream drinks. The items  listed above may not be a complete list of foods and drinks to avoid. Contact a dietitian for more information. Summary  Heart-healthy meal planning includes eating less unhealthy fats, eating more healthy fats, and making other changes in your diet.  Eat a balanced diet. This includes fruits and vegetables, low-fat or nonfat dairy, lean protein, nuts and legumes, whole grains, and heart-healthy oils and fats. This information is not intended to replace advice given to you by your health care provider. Make sure you discuss any questions you have with your health care provider. Document Revised: 11/09/2017 Document Reviewed: 10/13/2017 Elsevier Patient Education  2020 [employer].

## 2020-08-29 NOTE — Discharge Summary (Addendum)
Discharge Summary    Patient ID: Robert [name] MRN: [id number]; DOB: [birth year]  Admit date: 08/28/2020 Discharge date: 08/29/2020  Primary Care Provider: Biagio Borg, MD  Primary Cardiologist: Sherren Mocha, MD  Primary Electrophysiologist:  None   Discharge Diagnoses    Principal Problem:   NSTEMI (non-ST elevated myocardial infarction) [facility]) Active Problems:   Hyperlipidemia type IV   Essential hypertension   Coronary artery disease involving native heart with angina pectoris ([place])   S/P CABG x 3    Diagnostic Studies/Procedures    LHC 08/28/20 1.  Severe native three-vessel coronary artery disease with total occlusion of the proximal LAD, total occlusion of the mid RCA, and severe stenosis of the proximal circumflex 2.  Status post aortocoronary bypass surgery with continued patency of the LIMA to LAD graft and saphenous vein graft to PDA.  The PDA graft has moderate restenosis in the proximal body within the previously implanted stent.  This is unchanged over multiple cath studies.  There is also some mild disease in the distal vessel beyond the graft insertion site, also unchanged.  This is appropriate for medical therapy. 3.  Successful PCI of the left circumflex with a 3.0 x 26 mm resolute Onyx DES for treatment of progressive 90% stenosis, reduced to 0% with TIMI-3 flow pre and post.   Recommendations: Continue dual antiplatelet therapy with aspirin and clopidogrel at least 12 months, favor long-term therapy if tolerated.  As long as no complications arise, patient can be discharged tomorrow morning.  _____________   History of Present Illness     Robert [name] is a 74 y.o. male with history of CAD s/p CABG 1989 with subsequent PCIs, HTN, HLD, PVCs, GERD who presented to [facility] with chest pain and NSTEMI.   He has a history of CAD dating back to 1989 with CABG x 3. He had MI in 1995 treated with BMS to SVG-RCA, 07/2000 had PCI with BMS to native LCx and known  CTO of SVG-Diagonal. He had multiple heart catheterizations with stable anatomy. Last intervention was 207 with DES to OM2.  Last heart cath in September 2018 showed patent Cx stent and patent RCA stent with 40-50% ISR stable from cath in 2017, LIMA-LAD patent. He does receive care through the [place]. He was recently seen by Dr. Burt Knack 08/05/20 and was doing well. Unfortunately he presented back to the hospital yesterday with worsening chest pain. He had reported exertional discomfort for 4-5 days that worsened Robert of admission. He had taken several SL NTG but chest pain recurred prompting him to see care. In the ED his EKG showed NSR with old inferior infarct and hsTroponin 33->103. He was admitted for further management and placed on heparin/NTG gtts.  Hospital Course     Troponin peaked at 189. Covid swab was negative. He underwent LHC with findings as above s/p PCI/DES to the LCx. It was recommended to continue dual antiplatelet therapy with aspirin and clopidogrel at least 12 months, favor long-term therapy if tolerated. He tolerated this procedure well. His blood pressure was elevated yesterday so amlodipine 5mg  daily was added. Due to drug interaction between amlodipine and simvastatin, the team subsequently lowered his simvastatin dose to 20mg  daily. (I clarified with the patient he previously was unable to tolerate atorvastatin due to worsening reflux - originally had both atorvastatin and simvastatin on his home med list.) Of note, drug interaction between omeprazole and Plavix was identified as well. Per discussion with Dr. Johnsie Cancel, the patient was changed  to Prevacid instead since he indicated Protonix did not work for him in the past. Dr. Johnsie Cancel also felt it was acceptable to continue his home chronic NSAID if it worked for him (sulindac) but we gave instructions for him to have ongoing discussion with the prescriber of his sulindac given his dual antiplatelet therapy. Bleeding precautions also relayed.  He was somewhat hypertensive this morning prior to BP medicines with f/u BP 142/82. He ambulated well with cardiac rehab independently and felt well. Dr. Johnsie Cancel has seen and examined the patient today and feels he is stable for discharge. I have sent a message to our office's scheduling team requesting a 2 week TOC follow-up appointment, and our office will call the patient with this information. The patient reports he has Plavix already at home and did not need refills of this. Dr. Johnsie Cancel feels he can have his echocardiogram as outpatient. As long as Prevacid is controlling his acid reflux, could consider alternative statin agent such as Crestor since his simvastatin dose was reduced this admission - but would make one med change at a time since the other statin (atorvastatin) previously worsened his reflux. He will need follow-up lipid panel in the future to reassess his cholesterol control with timing determined in the outpatient setting.   Did the patient have an acute coronary syndrome (MI, NSTEMI, STEMI, etc) this admission?:  Yes                               AHA/ACC Clinical Performance & Quality Measures: Aspirin prescribed? - Yes ADP Receptor Inhibitor (Plavix/Clopidogrel, Brilinta/Ticagrelor or Effient/Prasugrel) prescribed (includes medically managed patients)? - Yes Beta Blocker prescribed? - Yes High Intensity Statin (Lipitor 40-80mg  or Crestor 20-40mg ) prescribed? - No - acid reflux with atorvastatin, need to ensure he tolerates prevacid for acid reflux before changing statin otherwise EF assessed during THIS hospitalization? - No - per Dr. Johnsie Cancel plan to obtain echo as outpatient For EF <40%, was ACEI/ARB prescribed? - N/A, pending OP assessment For EF <40%, Aldosterone Antagonist (Spironolactone or Eplerenone) prescribed? - N/A, pending OP assessment Cardiac Rehab Phase II ordered (including medically managed patients)? - Yes    _____________  Discharge Vitals Blood pressure  142/82 per cardiac rehab, pulse 61, temperature (!) 96.8 F (36 C), temperature source Axillary, resp. rate 18, height 6' (1.829 m), weight 108.9 kg, SpO2 96 %.  Filed Weights   08/28/20 0727  Weight: 108.9 kg   Vital Signs. BP (!) 168/105 (BP Location: Right Arm)   Pulse 61   Temp (!) 96.8 F (36 C) (Axillary)   Resp 18   Ht 6' (1.829 m)   Wt 108.9 kg   SpO2 96%   BMI 32.55 kg/m    Exam as per MD who saw/examined patient, they will addend where necessary. General: Well developed, well nourished M in no acute distress. Head: Normocephalic, atraumatic, sclera non-icteric, no xanthomas, nares are without discharge. Neck: Negative for carotid bruits. JVP not elevated. Lungs: Clear bilaterally to auscultation without wheezes, rales, or rhonchi. Breathing is unlabored. Heart: RRR S1 S2 without murmurs, rubs, or gallops.  Abdomen: Soft, non-tender, non-distended with normoactive bowel sounds. No rebound/guarding. Extremities: No clubbing or cyanosis. No edema. Distal pedal pulses are 2+ and equal bilaterally. Left wrist radial cath site without hematoma or ecchymosis; good pulse. Neuro: Alert and oriented X 3. Moves all extremities spontaneously. Psych:  Responds to questions appropriately with a normal affect.   Labs &  Radiologic Studies    CBC Recent Labs    08/28/20 0729 08/29/20 0238  WBC 5.7 9.4  HGB 13.4 13.6  HCT 44.6 42.3  MCV 76.2* 73.3*  PLT 223 993   Basic Metabolic Panel Recent Labs    08/29/20 0238 08/29/20 0804  NA 136 134*  K 4.0 4.3  CL 105 103  CO2 21* 24  GLUCOSE 97 103*  BUN 13 13  CREATININE 0.74 0.78  CALCIUM 8.2* 8.5*   Liver Function Tests Recent Labs    08/28/20 0729  AST 27  ALT 22  ALKPHOS 88  BILITOT 0.3  PROT 6.3*  ALBUMIN 3.5   Recent Labs    08/28/20 0729  LIPASE 33   High Sensitivity Troponin:   Recent Labs  Lab 08/28/20 0729 08/28/20 0936 08/28/20 1838 08/28/20 2041  TROPONINIHS 33* 103* 174* 189*    Fasting  Lipid Panel Recent Labs    08/29/20 0238  CHOL 122  HDL 31*  LDLCALC 69  TRIG 111  CHOLHDL 3.9   _____________  DG Chest 1 View  Result Date: 08/28/2020 CLINICAL DATA:  Chest pain EXAM: CHEST  1 VIEW COMPARISON:  06/08/2017 FINDINGS: Cardiomegaly. Prior median sternotomy. There is no edema, consolidation, effusion, or pneumothorax. Artifact from EKG leads. IMPRESSION: No evidence of active disease. Electronically Signed   By: Monte Fantasia M.D.   On: 08/28/2020 07:58   CARDIAC CATHETERIZATION  Result Date: 08/28/2020 1.  Severe native three-vessel coronary artery disease with total occlusion of the proximal LAD, total occlusion of the mid RCA, and severe stenosis of the proximal circumflex 2.  Status post aortocoronary bypass surgery with continued patency of the LIMA to LAD graft and saphenous vein graft to PDA.  The PDA graft has moderate restenosis in the proximal body within the previously implanted stent.  This is unchanged over multiple cath studies.  There is also some mild disease in the distal vessel beyond the graft insertion site, also unchanged.  This is appropriate for medical therapy. 3.  Successful PCI of the left circumflex with a 3.0 x 26 mm resolute Onyx DES for treatment of progressive 90% stenosis, reduced to 0% with TIMI-3 flow pre and post. Recommendations: Continue dual antiplatelet therapy with aspirin and clopidogrel at least 12 months, favor long-term therapy if tolerated.  As long as no complications arise, patient can be discharged tomorrow morning.  Disposition   Pt is being discharged home today in good condition.  Follow-up Plans & Appointments     Follow-up Information     Sherren Mocha, MD Follow up.   Specialty: Cardiology Why: Dr. Antionette Char office will call you to arrange outpatient echocardiogram (heart ultrasound) and post-hospital office visit. Contact information: [address] [place] [address] [phone]                 Discharge Instructions     Amb Referral to Cardiac Rehabilitation   Complete by: As directed    Diagnosis:  NSTEMI PTCA     After initial evaluation and assessments completed: Virtual Based Care may be provided alone or in conjunction with Phase 2 Cardiac Rehab based on patient barriers.: Yes   Diet - low sodium heart healthy   Complete by: As directed    Discharge instructions   Complete by: As directed    You were started on a new medicine called amlodipine for blood pressure. You should check your blood pressure once a Robert at least 3 hours after morning  medicines and bring that log to your appointment. If you notice your blood pressure running less than 105 on the top number or having any dizziness, call the office.  You were also started on a baby aspirin to take in addition to your Plavix/clopidogrel. If you notice any bleeding such as blood in stool, black tarry stools, blood in urine, nosebleeds or any other unusual bleeding, call your doctor immediately. It is not normal to have this kind of bleeding while on a blood thinner and usually indicates there is an underlying problem with one of your body systems that needs to be checked out.   Some studies suggest Prilosec/Omeprazole interacts with Plavix. We changed your Prilosec/Omeprazole to the equivalent dose of Prevacid/Lansoprazole for less chance of interaction.   Your simvastatin dose was decreased from 40mg  to 20mg  due to drug interaction with amlodipine.  Patients taking blood thinners or with heart history should generally limit medicines like sulindac, Clinoril, ibuprofen, Advil, Motrin, naproxen, and Aleve due to risk of stomach bleeding. Dr. Johnsie Cancel feels you can continue your sulindac but we generally recommend you reach out to the provider that prescribes this medicine to make sure they are aware of your heart history and that you take blood thinners. We generally tell patients you may take Tylenol as directed  or talk to your primary prescriber about alternatives.   Increase activity slowly   Complete by: As directed    No driving for 1 week. No lifting over 10 lbs for 2 weeks. No sexual activity for 2 weeks. You may not return to work until cleared at your follow-up appointment. Keep procedure site clean & dry. If you notice increased pain, swelling, bleeding or pus, call/return!  You may shower, but no soaking baths/hot tubs/pools for 1 week.       Discharge Medications   Allergies as of 08/29/2020       Reactions   Metoprolol Other (See Comments)   severe bradycardia   Lipitor [atorvastatin] Other (See Comments)   Worsening reflux   Adhesive [tape] Rash        Medication List     STOP taking these medications    omeprazole 20 MG capsule Commonly known as: PRILOSEC       TAKE these medications    albuterol 108 (90 Base) MCG/ACT inhaler Commonly known as: VENTOLIN HFA Inhale 1-2 puffs into the lungs every 6 (six) hours as needed for wheezing or shortness of breath.   amitriptyline 25 MG tablet Commonly known as: ELAVIL Take 1 tablet (25 mg total) by mouth at bedtime. Reported on 01/25/2016   amLODipine 5 MG tablet Commonly known as: NORVASC Take 1 tablet (5 mg total) by mouth daily. Start taking on: August 30, 2020   aspirin 81 MG EC tablet Take 1 tablet (81 mg total) by mouth daily. Swallow whole. Start taking on: August 30, 2020   atenolol 25 MG tablet Commonly known as: TENORMIN TAKE 1 TABLET AT BEDTIME   cholecalciferol 25 MCG (1000 UNIT) tablet Commonly known as: VITAMIN D3 Take 1,000 Units by mouth daily.   clopidogrel 75 MG tablet Commonly known as: PLAVIX TAKE 1 TABLET (75 MG TOTAL) BY MOUTH DAILY. What changed: See the new instructions.   DRY EYES OP Place 1 drop into both eyes daily as needed (For dry eyes).   isosorbide mononitrate 30 MG 24 hr tablet Commonly known as: IMDUR TAKE 1 TABLET TWICE DAILY   lansoprazole 30 MG  capsule Commonly known as: Prevacid Take 1  capsule (30 mg total) by mouth daily.   lisinopril 10 MG tablet Commonly known as: ZESTRIL TAKE 1 TABLET AT BEDTIME   nitroGLYCERIN 0.4 MG SL tablet Commonly known as: NITROSTAT Place 1 tablet (0.4 mg total) under the tongue every 5 (five) minutes as needed for chest pain.   sennosides-docusate sodium 8.6-50 MG tablet Commonly known as: SENOKOT-S Take 1 tablet by mouth at bedtime.   simvastatin 20 MG tablet Commonly known as: ZOCOR Take 1 tablet (20 mg total) by mouth every evening. What changed:  medication strength how much to take   sulindac 200 MG tablet Commonly known as: CLINORIL Take 200 mg by mouth 2 (two) times daily.   temazepam 30 MG capsule Commonly known as: RESTORIL TAKE 1 CAPSULE BY MOUTH AT BEDTIME AS NEEDED FOR SLEEP What changed: See the new instructions.   vitamin B-12 100 MCG tablet Commonly known as: CYANOCOBALAMIN Take 100 mcg by mouth daily.   Vitamin D (Ergocalciferol) 1.25 MG (50000 UNIT) Caps capsule Commonly known as: DRISDOL Take 1 capsule (50,000 Units total) by mouth every 7 (seven) days.           Outstanding Labs/Studies   N/A  Duration of Discharge Encounter   Greater than 30 minutes including physician time.  Signed, Charlie Pitter, PA-C 08/29/2020, 11:10 AM   Patient examined chart reviewed. Left radial cath site ok Cath with DES to circumflex with good results On DAT Ok to d/c home today   Jenkins Rouge MD [facility]

## 2020-08-29 NOTE — Progress Notes (Signed)
PCP name is Kandee Keen

## 2020-08-29 NOTE — Progress Notes (Signed)
CARDIAC REHAB PHASE I   PRE:  Rate/Rhythm: 60 SR  BP:  Supine:   Sitting: 162/104     SaO2: 94% RA  MODE:  Ambulation: 470 ft   POST:  Rate/Rhythm: 74 SR  BP:  Supine:   Sitting: 142/82    SaO2: 96% RA  Pt was lying in bed and was independent with sitting to EOB and standing up from bed. Pt's BP was elevated while sitting up but he was asymptomatic and had just taking his BP medicine an hour ago. Pt ambulated 462ft independently with no complaints or concerns. Returned pt seated on EOB and BP came down to 142/82. Discussed NTG use, Aspirin and Plavix, exercise guidelines, restrictions, heart healthy diet, and CRP II. Pt voiced understanding. Will send referral to [place], [place], CEP 08/29/2020

## 2020-08-31 ENCOUNTER — Encounter (HOSPITAL_COMMUNITY): Payer: Self-pay | Admitting: Cardiovascular Disease

## 2020-09-01 ENCOUNTER — Other Ambulatory Visit: Payer: Self-pay | Admitting: *Deleted

## 2020-09-01 NOTE — Patient Outreach (Signed)
[place] [facility]) Care Management  09/01/2020  Robert [name] Aug 18, 1946 [id number]   EMMI-GENERAL DISCHARGE-SUCCESSFUL RED ON EMMI ALERT Day #1 Date:08/31/2020 Red Alert Reason: QUESTIONS/CONCERNS  OUTREACH#1 RN spoke with pt today and verified the above emmi. Pt had questions concerning his golfing activities. RN strongly encouraged the pt to follow up with his providers and offered their contact numbers. Pt has the numbers and will call concerning when he can start back playing golf. Verified pt has his medications/follow up appointments with no nother needs presented at this time.   Will close case with no other needs at this time.  Raina Mina, RN Care Management Coordinator [place] Office [phone]

## 2020-09-01 NOTE — Patient Outreach (Signed)
[place] [facility]) Care Management  09/01/2020  MICHOLAS DRUMWRIGHT 28-Aug-1946 [id number]   EMMI-GENERAL DISCHARGE-UNSUCCESSFUL RED ON EMMI ALERT Day #1 Date: 08/31/2020 Red Alert Reason: QUESTIONS AND CONCERNS D/C PAPERWORK  OUTREACH #1 RN attempted outreach call however unsuccessful. RN able to  Leave a HIPAA approved voice message requesting a call back.   Will rescheduled another outreach over the next week for pending services.  Raina Mina, RN Care Management Coordinator [place] Office [phone]

## 2020-09-02 ENCOUNTER — Telehealth (HOSPITAL_COMMUNITY): Payer: Self-pay

## 2020-09-02 NOTE — Telephone Encounter (Signed)
Called patient to see if he is interested in the Cardiac Rehab Program. Patient expressed interest. Explained scheduling process and went over insurance, patient verbalized understanding. Will contact patient for scheduling once f/u has been completed.  °

## 2020-09-04 ENCOUNTER — Ambulatory Visit: Payer: Medicare HMO | Admitting: *Deleted

## 2020-09-07 ENCOUNTER — Other Ambulatory Visit: Payer: Self-pay

## 2020-09-07 ENCOUNTER — Ambulatory Visit (HOSPITAL_COMMUNITY): Payer: Medicare HMO | Attending: Cardiovascular Disease

## 2020-09-07 DIAGNOSIS — I214 Non-ST elevation (NSTEMI) myocardial infarction: Secondary | ICD-10-CM | POA: Insufficient documentation

## 2020-09-07 LAB — ECHOCARDIOGRAM COMPLETE
Area-P 1/2: 3.87 cm2
S' Lateral: 3.1 cm

## 2020-09-07 MED ORDER — PERFLUTREN LIPID MICROSPHERE
1.0000 mL | INTRAVENOUS | Status: AC | PRN
  Administered 2020-09-07: 3 mL via INTRAVENOUS

## 2020-09-21 ENCOUNTER — Encounter: Payer: Self-pay | Admitting: Physician Assistant

## 2020-09-21 NOTE — Progress Notes (Signed)
Cardiology Office Note    Date:  09/23/2020   ID:  Robert, [name] 24-Feb-1946, MRN [id number]  PCP:  Biagio Borg, MD  Cardiologist:  Sherren Mocha, MD  Electrophysiologist:  None   Chief Complaint: f/u CAD, MI  History of Present Illness:   Robert [name] is a 75 y.o. male with history of CAD s/p CABG 1989 with subsequent PCIs (most recently 08/2020), HTN, HLD, PVCs, dilation of aorta, GERD who presents for post-hospital follow-up.  He has a history of CAD dating back to 1989 with CABG x 3. He had MI in 1995 treated with BMS to SVG-RCA, 07/2000 had PCI with BMS to native LCx and known CTO of SVG-Diagonal. He had multiple heart catheterizations with stable anatomy. Prior to most recent admission, previous PCI was in 2017 with DES to OM2.He also receives care through the [place]. He was recently in the hospital for chest pain and NSTEMI with low level troponin elevation (103). He underwent LHC with findings below s/p PCI/DES to the LCx. It was recommended to continue dual antiplatelet therapy with aspirin and clopidogrel at least 12 months, favor long-term therapy if tolerated. He tolerated this procedure well. Amlodipine was added for elevated BP and simvastatin dose adjusted (previously unable to tolerate atorvastatin due to acid reflux). Prilosec was changed to Prevacid (Protonix did not work for him). 2D echo 09/07/20 showed EF 60-65%, mild LVH, grade 1 DD, mild LAE, mild dilation of ascending aorta.   He returns for follow-up doing well without any recent chest pain, SOB, palpitations or syncope. Tolerating all med changes well. Brought in a log of home BP readings which are within a satisfactory range. He knows Robert [name]. He has gradually returned to [employer] exercise and is feeling good. He is planning to engage in the cardiac rehab program (obviously contingent on plans given Covid rise).   Labwork independently reviewed: 08/2020 Na 134, K 4.3, Cr 0.78, LDL 69, Trig  111, HDL 31, Hgb 13.6, MCV 73, plt 222, AST/ALT wnl, TSH wnl   Past Medical History:  Diagnosis Date  . ALLERGIC RHINITIS 08/20/2007  . Anemia   . Arthritis    "back; knees; shoulders"  (06/08/2017)  . Asthma    "very slight"   . Barrett esophagus   . BENIGN PROSTATIC HYPERTROPHY 08/20/2007  . Coronary artery disease involving native heart with angina pectoris ([place])    a. s/p CABG x 3 in 1989;  b. MI in 1995 with BMS to VG->RCA;  c. 07/2000 PCI/BMS to native LCX;  d. Known CTO of VG->Diag - multiple caths since then. e. PCI/DES to LCx 08/2020.  . ELBOW PAIN, LEFT 04/15/2008  . ERECTILE DYSFUNCTION 08/20/2007  . Gallbladder polyp 08/27/2015   Noted on [place] Abd  . GERD (gastroesophageal reflux disease)   . Hepatic cyst 09/06/2015   Large simple right hepatic lobe, noted on [place] Abd  . History of cardiomegaly 05/15/2013   Noted on CXR  . History of meningitis    2nd grade, 8th grade, 9th grade, age 40  . Hyperlipidemia LDL goal <70   . HYPERTENSION    "been on RX since CABG but never a problem" (06/08/2017)  . INSOMNIA-SLEEP DISORDER-UNSPEC 03/17/2008  . Microcytosis   . Mild dilation of ascending aorta (HCC)   . Myocardial infarction [facility]) June 1995  . OSA on CPAP   . PREMATURE VENTRICULAR CONTRACTIONS 08/20/2007  . SCROTAL MASS 10/09/2008  . URINARY INCONTINENCE, MALE 10/09/2008  . UTI 04/24/2009   "  just this once" (05/15/2013)    Past Surgical History:  Procedure Laterality Date  . ANKLE FRACTURE SURGERY Right 1984  . BACK SURGERY    . CARDIAC CATHETERIZATION  X 2  . CARDIAC CATHETERIZATION N/A 04/01/2016   Procedure: Left Heart Cath and Cors/Grafts Angiography;  Surgeon: Leonie Man, MD;  Location: [facility];  Service: Cardiovascular;  Laterality: N/A;  . CARDIAC CATHETERIZATION N/A 04/01/2016   Procedure: Intravascular Pressure Wire/FFR Study;  Surgeon: Leonie Man, MD;  Location: [facility];  Service: Cardiovascular;  Laterality: N/A;  . CARDIAC  CATHETERIZATION N/A 04/01/2016   Procedure: Coronary Stent Intervention;  Surgeon: Leonie Man, MD;  Location: [facility];  Service: Cardiovascular;  Laterality: N/A;  . CARDIAC CATHETERIZATION  06/08/2017  . COLONOSCOPY  08/31/2011  . COLONOSCOPY W/ BIOPSIES AND POLYPECTOMY  11/20/2003   adenomatous polyp, internal and external hemorrhoids  . CORONARY ANGIOPLASTY  95, 98, 2000, 2001  . [place]- 04/01/2016   "total of 4 stents counting today" (04/01/2016)  . CORONARY ARTERY BYPASS GRAFT  1989   triple bypass MUSC  . CYSTOSCOPY  08/07/2009   and right hydrocelectomy  . FOOT TENDON SURGERY Left 2012   "donor tendon"   . HEMI-MICRODISCECTOMY LUMBAR LAMINECTOMY LEVEL 1 Right 09/18/2013   Procedure: CENTRAL DECOMPRESSION @ L4-5 FOR SPINAL STENOSIS. EXCISION OF SYNOVIAL CYST @ L4-5 RIGHT, MICRODISCECTOMY @L4 -5 RIGHT ;  Surgeon: Tobi Bastos, MD;  Location: [facility];  Service: Orthopedics;  Laterality: Right;  . INGUINAL HERNIA REPAIR Bilateral 1970's   "2 on one side; 1 on the other"   . KNEE ARTHROSCOPY WITH MEDIAL MENISECTOMY Left 09/18/2018   Procedure: DIAGNOSTIC KNEE ARTHROSCOPY WITH MEDIAL MENISECTOMY;  Surgeon: Latanya Maudlin, MD;  Location: [facility];  Service: Orthopedics;  Laterality: Left;  95min  . LEFT HEART CATH AND CORS/GRAFTS ANGIOGRAPHY N/A 06/08/2017   Procedure: LEFT HEART CATH AND CORS/GRAFTS ANGIOGRAPHY;  Surgeon: Lorretta Harp, MD;  Location: [facility];  Service: Cardiovascular;  Laterality: N/A;  . LEFT HEART CATH AND CORS/GRAFTS ANGIOGRAPHY N/A 08/28/2020   Procedure: LEFT HEART CATH AND CORS/GRAFTS ANGIOGRAPHY;  Surgeon: Sherren Mocha, MD;  Location: [facility];  Service: Cardiovascular;  Laterality: N/A;  . LEFT HEART CATHETERIZATION WITH CORONARY ANGIOGRAM Bilateral 05/15/2013   Procedure: LEFT HEART CATHETERIZATION WITH CORONARY ANGIOGRAM;  Surgeon: Wellington Hampshire, MD;  Location: [facility];   Service: Cardiovascular;  Laterality: Bilateral;  . SHOULDER OPEN ROTATOR CUFF REPAIR Right 2009   [place]  . SURGERY SCROTAL / TESTICULAR Right 2012   "removed the sac" (05/15/2013)  . TONSILLECTOMY AND ADENOIDECTOMY  ~1961  . TOTAL KNEE ARTHROPLASTY Left 07/01/2019   Procedure: LEFT TOTAL KNEE ARTHROPLASTY;  Surgeon: Gaynelle Arabian, MD;  Location: [facility];  Service: Orthopedics;  Laterality: Left;  43min  . TRANSURETHRAL RESECTION OF PROSTATE  10/02/2009   [place] transurethral resection   . UPPER GI ENDOSCOPY  09/06/2011    Current Medications: Current Meds  Medication Sig  . albuterol (VENTOLIN HFA) 108 (90 Base) MCG/ACT inhaler Inhale 1-2 puffs into the lungs every 6 (six) hours as needed for wheezing or shortness of breath.  Marland Kitchen amitriptyline (ELAVIL) 25 MG tablet Take 1 tablet (25 mg total) by mouth at bedtime. Reported on 01/25/2016  . amLODipine (NORVASC) 5 MG tablet Take 1 tablet (5 mg total) by mouth daily.  . Artificial Tear Ointment (DRY EYES OP) Place 1 drop into  both eyes daily as needed (For dry eyes).  Marland Kitchen aspirin EC 81 MG EC tablet Take 1 tablet (81 mg total) by mouth daily. Swallow whole.  Marland Kitchen atenolol (TENORMIN) 25 MG tablet TAKE 1 TABLET AT BEDTIME (Patient taking differently: Take 25 mg by mouth at bedtime.)  . cholecalciferol (VITAMIN D3) 25 MCG (1000 UNIT) tablet Take 1,000 Units by mouth daily.  . clopidogrel (PLAVIX) 75 MG tablet TAKE 1 TABLET (75 MG TOTAL) BY MOUTH DAILY. (Patient taking differently: Take 75 mg by mouth daily.)  . isosorbide mononitrate (IMDUR) 30 MG 24 hr tablet TAKE 1 TABLET TWICE DAILY  . lansoprazole (PREVACID) 30 MG capsule Take 1 capsule (30 mg total) by mouth daily.  Marland Kitchen lisinopril (ZESTRIL) 10 MG tablet TAKE 1 TABLET AT BEDTIME  . nitroGLYCERIN (NITROSTAT) 0.4 MG SL tablet Place 1 tablet (0.4 mg total) under the tongue every 5 (five) minutes as needed for chest pain.  Marland Kitchen sennosides-docusate sodium (SENOKOT-S) 8.6-50 MG tablet Take 1 tablet by mouth at  bedtime.  . simvastatin (ZOCOR) 20 MG tablet Take 1 tablet (20 mg total) by mouth every evening.  . sulindac (CLINORIL) 200 MG tablet Take 200 mg by mouth 2 (two) times daily.  . temazepam (RESTORIL) 30 MG capsule TAKE 1 CAPSULE BY MOUTH AT BEDTIME AS NEEDED FOR SLEEP (Patient taking differently: Take 30 mg by mouth at bedtime as needed for sleep.)  . vitamin B-12 (CYANOCOBALAMIN) 100 MCG tablet Take 100 mcg by mouth daily.  . Vitamin D, Ergocalciferol, (DRISDOL) 1.25 MG (50000 UT) CAPS capsule Take 1 capsule (50,000 Units total) by mouth every 7 (seven) days.      Allergies:   Metoprolol, Lipitor [atorvastatin], and Adhesive [tape]   Social History   Socioeconomic History  . Marital status: Married    Spouse name: Not on file  . Number of children: 1  . Years of education: Not on file  . Highest education level: Not on file  Occupational History  . 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 Smoking status: Former Smoker    Packs/day: 1.00    Years: 45.00    Pack years: 45.00    Types: Cigarettes    Quit date: 04/23/1994    Years since quitting: 26.4  . Smokeless tobacco: Never Used  Vaping Use  . Vaping Use: Never used  Substance and Sexual Activity  . Alcohol use: Not Currently    Comment:  "I've  quit drinking in 1989"  . Drug use: No  . Sexual activity: Yes  Other Topics Concern  . Not on file  Social History Narrative  . Not on file   Social Determinants of Health   Financial Resource Strain: Not on file  Food Insecurity: Not on file  Transportation Needs: Not on file  Physical Activity: Not on file  Stress: Not on file  Social Connections: Not on file     Family History:  The patient's family history includes Coronary artery disease in his father and mother; Esophageal cancer in his brother; Heart disease in his brother.  ROS:   Please see the history of present illness. All other systems are reviewed  and otherwise negative.    EKGs/Labs/Other Studies Reviewed:    Studies reviewed are outlined and summarized above. Reports included below if pertinent.  LHC 08/28/20 1. Severe native three-vessel coronary artery disease with total occlusion of the proximal LAD, total occlusion of the mid RCA, and  severe stenosis of the proximal circumflex 2. Status post aortocoronary bypass surgery with continued patency of the LIMA to LAD graft and saphenous vein graft to PDA. The PDA graft has moderate restenosis in the proximal body within the previously implanted stent. This is unchanged over multiple cath studies. There is also some mild disease in the distal vessel beyond the graft insertion site, also unchanged. This is appropriate for medical therapy. 3. Successful PCI of the left circumflex with a 3.0 x 26 mm resolute Onyx DES for treatment of progressive 90% stenosis, reduced to 0% with TIMI-3 flow pre and post.  Recommendations: Continue dual antiplatelet therapy with aspirin and clopidogrel at least 12 months, favor long-term therapy if tolerated. As long as no complications arise, patient can be discharged tomorrow morning.  2D Echo 09/07/20 1. Left ventricular ejection fraction, by estimation, is 60 to 65%. The  left ventricle has normal function. The left ventricle has no regional  wall motion abnormalities. There is mild concentric left ventricular  hypertrophy. Left ventricular diastolic  parameters are consistent with Grade I diastolic dysfunction (impaired  relaxation).  2. Right ventricular systolic function is normal. The right ventricular  size is normal.  3. Left atrial size was mildly dilated.  4. The mitral valve is normal in structure. Trivial mitral valve  regurgitation. No evidence of mitral stenosis.  5. The aortic valve is normal in structure. Aortic valve regurgitation is  trivial. No aortic stenosis is present.  6. Aortic dilatation noted. There is mild  dilatation at the level of the  sinuses of Valsalva, measuring 42 mm. There is mild dilatation of the  ascending aorta.  7. The inferior vena cava is normal in size with greater than 50%  respiratory variability, suggesting right atrial pressure of 3 mmHg.      EKG:  EKG is ordered today, personally reviewed, demonstrating SB 57bpm, no acute STT Changes   Recent Labs: 08/28/2020: ALT 22 08/29/2020: BUN 13; Creatinine, Ser 0.78; Hemoglobin 13.6; Platelets 222; Potassium 4.3; Sodium 134  Recent Lipid Panel    Component Value Date/Time   CHOL 122 08/29/2020 0238   CHOL 119 08/29/2018 1416   TRIG 111 08/29/2020 0238   HDL 31 (L) 08/29/2020 0238   HDL 38 (L) 08/29/2018 1416   CHOLHDL 3.9 08/29/2020 0238   VLDL 22 08/29/2020 0238   LDLCALC 69 08/29/2020 0238   LDLCALC 67 08/29/2018 1416    PHYSICAL EXAM:    VS:  BP 122/78   Pulse (!) 57   Ht 6' (1.829 m)   Wt 247 lb (112 kg)   SpO2 97%   BMI 33.50 kg/m   BMI: Body mass index is 33.5 kg/m.  GEN: Well nourished, well developed WM, in no acute distress HEENT: normocephalic, atraumatic Neck: no JVD, carotid bruits, or masses Cardiac: RRR; no murmurs, rubs, or gallops, no edema  Respiratory:  clear to auscultation bilaterally, normal work of breathing GI: soft, nontender, nondistended, + BS MS: no deformity or atrophy Skin: warm and dry, no rash, left radial cath site without hematoma or ecchymosis; good pulse. Neuro:  Alert and Oriented x 3, Strength and sensation are intact, follows commands Psych: euthymic mood, full affect  Wt Readings from Last 3 Encounters:  09/23/20 247 lb (112 kg)  08/28/20 240 lb (108.9 kg)  08/05/20 245 lb 9.6 oz (111.4 kg)     ASSESSMENT & PLAN:   1. CAD - doing well post-PCI. Continue dual antiplatelet therapy with aspirin and clopidogrel at least  12 months, favor long-term therapy if tolerated. Continue beta blocker. See below regarding statin. He is also tolerating the transition from  Prilosec to Prevacid, chosen due to interaction with Plavix. He is also on sulindac chronically. In the hospital, Dr. Johnsie Cancel also felt it was acceptable to continue his home chronic NSAID if it worked for him but recommended ongoing discussion with the prescriber of his sulindac given his dual antiplatelet therapy.  2. Essential HTN - BP improved with addition of amlodipine. Continue current regimen.  3. Hyperlipidemia - statin dose downtitrated in the hospital due to interaction with amlodipine. He is also working on lifestyle modifications with healthier diet. Check fasting lipid/LFTs in 3 weeks - if LDL is >70 would suggest trial of switching to rosuvastatin. He did not tolerate atorvastatin in the past whatsoever per his report.  4. Mild dilation of ascending aorta - noted on echocardiogram recently, continue to monitor clinically for now. Can consider repeat echo in 08/2021 at the discretion of primary cardiologist.  Disposition: F/u with Dr. Burt Knack in 4 months, then can likely return to usual timeframe of follow-up.  Medication Adjustments/Labs and Tests Ordered: Current medicines are reviewed at length with the patient today.  Concerns regarding medicines are outlined above. Medication changes, Labs and Tests ordered today are summarized above and listed in the Patient Instructions accessible in Encounters.   Signed, Charlie Pitter, PA-C  09/23/2020 12:22 PM    [facility] [address], [place], [place]  [address] Phone: [phone]; Fax: [phone]

## 2020-09-23 ENCOUNTER — Ambulatory Visit: Payer: Medicare HMO | Admitting: Physician Assistant

## 2020-09-23 ENCOUNTER — Encounter: Payer: Self-pay | Admitting: Physician Assistant

## 2020-09-23 ENCOUNTER — Other Ambulatory Visit: Payer: Self-pay

## 2020-09-23 VITALS — BP 122/78 | HR 57 | Ht 72.0 in | Wt 247.0 lb

## 2020-09-23 DIAGNOSIS — I77810 Thoracic aortic ectasia: Secondary | ICD-10-CM

## 2020-09-23 DIAGNOSIS — I2510 Atherosclerotic heart disease of native coronary artery without angina pectoris: Secondary | ICD-10-CM

## 2020-09-23 DIAGNOSIS — I10 Essential (primary) hypertension: Secondary | ICD-10-CM | POA: Diagnosis not present

## 2020-09-23 DIAGNOSIS — E785 Hyperlipidemia, unspecified: Secondary | ICD-10-CM | POA: Diagnosis not present

## 2020-09-23 NOTE — Patient Instructions (Addendum)
Medication Instructions:  Your physician recommends that you continue on your current medications as directed. Please refer to the Current Medication list given to you today.  *If you need a refill on your cardiac medications before your next appointment, please call your pharmacy*   Lab Work: 10/15/20:  COME TO THE OFFICE, FASTING, ANYTIME AFTER 7:30 A.M FOR LIPID & LFT If you have labs (blood work) drawn today and your tests are completely normal, you will receive your results only by: Marland Kitchen MyChart Message (if you have MyChart) OR . A paper copy in the mail If you have any lab test that is abnormal or we need to change your treatment, we will call you to review the results.   Testing/Procedures: None ordered   Follow-Up: At [facility], you and your health needs are our priority.  As part of our continuing mission to provide you with exceptional heart care, we have created designated Provider Care Teams.  These Care Teams include your primary Cardiologist (physician) and Advanced Practice Providers (APPs -  Physician Assistants and Nurse Practitioners) who all work together to provide you with the care you need, when you need it.  We recommend signing up for the patient portal called "MyChart".  Sign up information is provided on this After Visit Summary.  MyChart is used to connect with patients for Virtual Visits (Telemedicine).  Patients are able to view lab/test results, encounter notes, upcoming appointments, etc.  Non-urgent messages can be sent to your provider as well.   To learn more about what you can do with MyChart, go to [email or web address].    Your next appointment:   4 month(s)  The format for your next appointment:   In Person  Provider:   You may see Tonny Bollman, MD or one of the following Advanced Practice Providers on your designated Care Team:    Tereso Newcomer, PA-C  Chelsea Aus, [place]    Other Instructions

## 2020-10-14 ENCOUNTER — Telehealth (HOSPITAL_COMMUNITY): Payer: Self-pay

## 2020-10-14 NOTE — Telephone Encounter (Signed)
Called patient to see if he was interested in participating in the Cardiac Rehab Program. Patient stated yes. Patient will come in for orientation on 11/19/20 @ 10AM and will attend the 9:15AM exercise class. Went over insurance, patient verbalized understanding.   Tourist information centre manager.

## 2020-10-14 NOTE — Telephone Encounter (Signed)
Pt insurance is active and benefits verified through [facility]. Co-pay $10.00, DED $0.00/$0.00 met, out of pocket $0.00/$0.00 met, co-insurance 0%. No pre-authorization required. Passport, 10/14/20 @ 1:51PM, GFQ#[id number]

## 2020-10-15 ENCOUNTER — Other Ambulatory Visit: Payer: Self-pay

## 2020-10-15 ENCOUNTER — Other Ambulatory Visit: Payer: Medicare HMO

## 2020-10-15 DIAGNOSIS — I77810 Thoracic aortic ectasia: Secondary | ICD-10-CM

## 2020-10-15 DIAGNOSIS — E785 Hyperlipidemia, unspecified: Secondary | ICD-10-CM

## 2020-10-15 DIAGNOSIS — I2510 Atherosclerotic heart disease of native coronary artery without angina pectoris: Secondary | ICD-10-CM | POA: Diagnosis not present

## 2020-10-15 DIAGNOSIS — I10 Essential (primary) hypertension: Secondary | ICD-10-CM

## 2020-10-15 LAB — HEPATIC FUNCTION PANEL
ALT: 17 IU/L (ref 0–44)
AST: 18 IU/L (ref 0–40)
Albumin: 4.1 g/dL (ref 3.7–4.7)
Alkaline Phosphatase: 117 IU/L (ref 44–121)
Bilirubin Total: 0.2 mg/dL (ref 0.0–1.2)
Bilirubin, Direct: 0.12 mg/dL (ref 0.00–0.40)
Total Protein: 6.6 g/dL (ref 6.0–8.5)

## 2020-10-15 LAB — LIPID PANEL
Chol/HDL Ratio: 3.8 ratio (ref 0.0–5.0)
Cholesterol, Total: 115 mg/dL (ref 100–199)
HDL: 30 mg/dL — ABNORMAL LOW (ref >39)
LDL Chol Calc (NIH): 73 mg/dL (ref 0–99)
Triglycerides: 52 mg/dL (ref 0–149)
VLDL Cholesterol Cal: 12 mg/dL (ref 5–40)

## 2020-10-19 ENCOUNTER — Encounter: Payer: Self-pay | Admitting: *Deleted

## 2020-10-23 ENCOUNTER — Telehealth (HOSPITAL_COMMUNITY): Payer: Self-pay

## 2020-10-23 NOTE — Telephone Encounter (Signed)
Pt called and stated that his wife is going through some health issues and that he wouldn't be able to do the cardiac rehab program, canceled his cardiac rehab sessions.

## 2020-11-19 ENCOUNTER — Ambulatory Visit (HOSPITAL_COMMUNITY): Payer: Medicare HMO

## 2020-11-23 ENCOUNTER — Ambulatory Visit (HOSPITAL_COMMUNITY): Payer: Medicare HMO

## 2020-11-23 ENCOUNTER — Other Ambulatory Visit: Payer: Self-pay | Admitting: Internal Medicine

## 2020-11-23 NOTE — Telephone Encounter (Signed)
Ok to let pt know  This is last temazepam refill   Needs ROV for further refills

## 2020-11-24 ENCOUNTER — Other Ambulatory Visit: Payer: Self-pay | Admitting: Internal Medicine

## 2020-11-24 NOTE — Telephone Encounter (Signed)
Pt notified and ov schedule for further refills

## 2020-11-25 ENCOUNTER — Ambulatory Visit (HOSPITAL_COMMUNITY): Payer: Medicare HMO

## 2020-11-25 NOTE — Telephone Encounter (Signed)
Please refill as per office routine med refill policy (all routine meds refilled for 3 mo or monthly per pt preference up to one year from last visit, then month to month grace period for 3 mo, then further med refills will have to be denied)  

## 2020-11-27 ENCOUNTER — Ambulatory Visit (HOSPITAL_COMMUNITY): Payer: Medicare HMO

## 2020-11-30 ENCOUNTER — Ambulatory Visit (HOSPITAL_COMMUNITY): Payer: Medicare HMO

## 2020-12-02 ENCOUNTER — Ambulatory Visit (HOSPITAL_COMMUNITY): Payer: Medicare HMO

## 2020-12-04 ENCOUNTER — Ambulatory Visit (HOSPITAL_COMMUNITY): Payer: Medicare HMO

## 2020-12-07 ENCOUNTER — Other Ambulatory Visit: Payer: Self-pay

## 2020-12-07 ENCOUNTER — Encounter: Payer: Self-pay | Admitting: Internal Medicine

## 2020-12-07 ENCOUNTER — Ambulatory Visit (INDEPENDENT_AMBULATORY_CARE_PROVIDER_SITE_OTHER): Payer: Medicare HMO | Admitting: Internal Medicine

## 2020-12-07 ENCOUNTER — Ambulatory Visit (HOSPITAL_COMMUNITY): Payer: Medicare HMO

## 2020-12-07 VITALS — BP 118/68 | HR 60 | Temp 98.6°F | Ht 72.0 in | Wt 246.8 lb

## 2020-12-07 DIAGNOSIS — I10 Essential (primary) hypertension: Secondary | ICD-10-CM

## 2020-12-07 DIAGNOSIS — E559 Vitamin D deficiency, unspecified: Secondary | ICD-10-CM | POA: Diagnosis not present

## 2020-12-07 DIAGNOSIS — E538 Deficiency of other specified B group vitamins: Secondary | ICD-10-CM | POA: Diagnosis not present

## 2020-12-07 DIAGNOSIS — R7302 Impaired glucose tolerance (oral): Secondary | ICD-10-CM | POA: Diagnosis not present

## 2020-12-07 DIAGNOSIS — Z0000 Encounter for general adult medical examination without abnormal findings: Secondary | ICD-10-CM

## 2020-12-07 DIAGNOSIS — I2510 Atherosclerotic heart disease of native coronary artery without angina pectoris: Secondary | ICD-10-CM | POA: Diagnosis not present

## 2020-12-07 MED ORDER — TEMAZEPAM 30 MG PO CAPS: ORAL_CAPSULE | ORAL | 1 refills | Status: DC

## 2020-12-07 MED ORDER — OMEPRAZOLE 20 MG PO CPDR: DELAYED_RELEASE_CAPSULE | ORAL | 3 refills | Status: DC

## 2020-12-07 MED ORDER — AMLODIPINE BESYLATE 5 MG PO TABS: 5.0000 mg | ORAL_TABLET | Freq: Every day | ORAL | 3 refills | Status: DC

## 2020-12-07 MED ORDER — ISOSORBIDE MONONITRATE ER 30 MG PO TB24: 30.0000 mg | ORAL_TABLET | Freq: Two times a day (BID) | ORAL | 3 refills | Status: DC

## 2020-12-07 MED ORDER — AMITRIPTYLINE HCL 25 MG PO TABS
25.0000 mg | ORAL_TABLET | Freq: Every day | ORAL | 1 refills | Status: DC
Start: 2020-12-07 — End: 2021-11-19

## 2020-12-07 MED ORDER — LISINOPRIL 10 MG PO TABS
10.0000 mg | ORAL_TABLET | Freq: Every day | ORAL | 3 refills | Status: DC
Start: 2020-12-07 — End: 2021-03-19

## 2020-12-07 MED ORDER — NITROGLYCERIN 0.4 MG SL SUBL: 0.4000 mg | SUBLINGUAL_TABLET | SUBLINGUAL | 3 refills | Status: DC | PRN

## 2020-12-07 MED ORDER — CLOPIDOGREL BISULFATE 75 MG PO TABS: 75.0000 mg | ORAL_TABLET | Freq: Every day | ORAL | 3 refills | Status: DC

## 2020-12-07 MED ORDER — ATENOLOL 25 MG PO TABS
25.0000 mg | ORAL_TABLET | Freq: Every day | ORAL | 3 refills | Status: DC
Start: 2020-12-07 — End: 2021-11-19

## 2020-12-07 NOTE — Assessment & Plan Note (Signed)
Lab Results  Component Value Date   HGBA1C 5.5 08/29/2019   Stable, pt to continue current medical treatment  - diet and wt control

## 2020-12-07 NOTE — Progress Notes (Signed)
Patient ID: Robert [name], male   DOB: 1945-11-20, 75 y.o.   MRN: [id number]         Chief Complaint:: wellness exam and Medication Refill         HPI:  Robert [name] is a 75 y.o. male here for wellness exam; declines colonoscopy for now with recent stent on plavix, o/w up to date with preventive referrals and immunizations                        Also Pt denies chest pain, increased sob or doe, wheezing, orthopnea, PND, increased LE swelling, palpitations, dizziness or syncope.   Pt denies polydipsia, polyuria,  Denies new worsening focal neuro s/s.   Pt denies fever, wt loss, night sweats, loss of appetite, or other constitutional symptoms  No new complaints    Wt Readings from Last 3 Encounters:  12/07/20 246 lb 12.8 oz (111.9 kg)  09/23/20 247 lb (112 kg)  08/28/20 240 lb (108.9 kg)   BP Readings from Last 3 Encounters:  12/07/20 118/68  09/23/20 122/78  08/29/20 (!) 142/83   Immunization History  Administered Date(s) Administered  . Fluad Quad(high Dose 65+) 08/13/2019, 06/19/2020  . H1N1 10/09/2008  . Influenza Whole 08/20/2007, 07/21/2008  . Influenza-Unspecified 07/20/2013, 08/04/2015  . PFIZER(Purple Top)SARS-COV-2 Vaccination 11/10/2019, 12/02/2019, 06/29/2020  . Pneumococcal Conjugate-13 06/24/2014  . Pneumococcal Polysaccharide-23 06/24/2013  . Pneumococcal-Unspecified 05/20/2010  . Td 10/09/2008   There are no preventive care reminders to display for this patient.    Past Medical History:  Diagnosis Date  . ALLERGIC RHINITIS 08/20/2007  . Anemia   . Arthritis    "back; knees; shoulders"  (06/08/2017)  . Asthma    "very slight"   . Barrett esophagus   . BENIGN PROSTATIC HYPERTROPHY 08/20/2007  . Coronary artery disease involving native heart with angina pectoris ([place])    a. s/p CABG x 3 in 1989;  b. MI in 1995 with BMS to VG->RCA;  c. 07/2000 PCI/BMS to native LCX;  d. Known CTO of VG->Diag - multiple caths since then. e. PCI/DES to LCx 08/2020.  . ELBOW  PAIN, LEFT 04/15/2008  . ERECTILE DYSFUNCTION 08/20/2007  . Gallbladder polyp 08/27/2015   Noted on [place] Abd  . GERD (gastroesophageal reflux disease)   . Hepatic cyst 09/06/2015   Large simple right hepatic lobe, noted on [place] Abd  . History of cardiomegaly 05/15/2013   Noted on CXR  . History of meningitis    2nd grade, 8th grade, 9th grade, age 25  . Hyperlipidemia LDL goal <70   . HYPERTENSION    "been on RX since CABG but never a problem" (06/08/2017)  . INSOMNIA-SLEEP DISORDER-UNSPEC 03/17/2008  . Microcytosis   . Mild dilation of ascending aorta (HCC)   . Myocardial infarction [facility]) June 1995  . OSA on CPAP   . PREMATURE VENTRICULAR CONTRACTIONS 08/20/2007  . SCROTAL MASS 10/09/2008  . URINARY INCONTINENCE, MALE 10/09/2008  . UTI 04/24/2009   "just this once" (05/15/2013)   Past Surgical History:  Procedure Laterality Date  . ANKLE FRACTURE SURGERY Right 1984  . BACK SURGERY    . CARDIAC CATHETERIZATION  X 2  . CARDIAC CATHETERIZATION N/A 04/01/2016   Procedure: Left Heart Cath and Cors/Grafts Angiography;  Surgeon: Leonie Man, MD;  Location: [place] [facility];  Service: Cardiovascular;  Laterality: N/A;  . CARDIAC CATHETERIZATION N/A 04/01/2016   Procedure: Intravascular Pressure Wire/FFR Study;  Surgeon: Leonie Man,  MD;  Location: [place] [facility];  Service: Cardiovascular;  Laterality: N/A;  . CARDIAC CATHETERIZATION N/A 04/01/2016   Procedure: Coronary Stent Intervention;  Surgeon: Leonie Man, MD;  Location: [place] [facility];  Service: Cardiovascular;  Laterality: N/A;  . CARDIAC CATHETERIZATION  06/08/2017  . COLONOSCOPY  08/31/2011  . COLONOSCOPY W/ BIOPSIES AND POLYPECTOMY  11/20/2003   adenomatous polyp, internal and external hemorrhoids  . CORONARY ANGIOPLASTY  95, 98, 2000, 2001  . [place]- 04/01/2016   "total of 4 stents counting today" (04/01/2016)  . CORONARY ARTERY BYPASS GRAFT  1989   triple bypass [facility]  .  CYSTOSCOPY  08/07/2009   and right hydrocelectomy  . FOOT TENDON SURGERY Left 2012   "donor tendon"   . HEMI-MICRODISCECTOMY LUMBAR LAMINECTOMY LEVEL 1 Right 09/18/2013   Procedure: CENTRAL DECOMPRESSION @ L4-5 FOR SPINAL STENOSIS. EXCISION OF SYNOVIAL CYST @ L4-5 RIGHT, MICRODISCECTOMY @L4 -5 RIGHT ;  Surgeon: Tobi Bastos, MD;  Location: [facility];  Service: Orthopedics;  Laterality: Right;  . INGUINAL HERNIA REPAIR Bilateral 1970's   "2 on one side; 1 on the other"   . KNEE ARTHROSCOPY WITH MEDIAL MENISECTOMY Left 09/18/2018   Procedure: DIAGNOSTIC KNEE ARTHROSCOPY WITH MEDIAL MENISECTOMY;  Surgeon: Latanya Maudlin, MD;  Location: [facility];  Service: Orthopedics;  Laterality: Left;  29min  . LEFT HEART CATH AND CORS/GRAFTS ANGIOGRAPHY N/A 06/08/2017   Procedure: LEFT HEART CATH AND CORS/GRAFTS ANGIOGRAPHY;  Surgeon: Lorretta Harp, MD;  Location: [place] [facility];  Service: Cardiovascular;  Laterality: N/A;  . LEFT HEART CATH AND CORS/GRAFTS ANGIOGRAPHY N/A 08/28/2020   Procedure: LEFT HEART CATH AND CORS/GRAFTS ANGIOGRAPHY;  Surgeon: Sherren Mocha, MD;  Location: [place] [facility];  Service: Cardiovascular;  Laterality: N/A;  . LEFT HEART CATHETERIZATION WITH CORONARY ANGIOGRAM Bilateral 05/15/2013   Procedure: LEFT HEART CATHETERIZATION WITH CORONARY ANGIOGRAM;  Surgeon: Wellington Hampshire, MD;  Location: [place] [facility];  Service: Cardiovascular;  Laterality: Bilateral;  . SHOULDER OPEN ROTATOR CUFF REPAIR Right 2009   [place]  . SURGERY SCROTAL / TESTICULAR Right 2012   "removed the sac" (05/15/2013)  . TONSILLECTOMY AND ADENOIDECTOMY  ~1961  . TOTAL KNEE ARTHROPLASTY Left 07/01/2019   Procedure: LEFT TOTAL KNEE ARTHROPLASTY;  Surgeon: Gaynelle Arabian, MD;  Location: [facility];  Service: Orthopedics;  Laterality: Left;  52min  . TRANSURETHRAL RESECTION OF PROSTATE  10/02/2009   [place] transurethral resection   . UPPER GI ENDOSCOPY  09/06/2011    reports that he quit smoking about 26 years  ago. His smoking use included cigarettes. He has a 45.00 pack-year smoking history. He has never used smokeless tobacco. He reports previous alcohol use. He reports that he does not use drugs. family history includes Coronary artery disease in his father and mother; Esophageal cancer in his brother; Heart disease in his brother. Allergies  Allergen Reactions  . Metoprolol Other (See Comments)    severe bradycardia  . Lipitor [Atorvastatin] Other (See Comments)    Worsening reflux  . Adhesive [Tape] Rash   Current Outpatient Medications on File Prior to Visit  Medication Sig Dispense Refill  . albuterol (VENTOLIN HFA) 108 (90 Base) MCG/ACT inhaler Inhale 1-2 puffs into the lungs every 6 (six) hours as needed for wheezing or shortness of breath.    . Artificial Tear Ointment (DRY EYES OP) Place 1 drop into both eyes daily as needed (For dry eyes).    Marland Kitchen aspirin EC 81 MG EC tablet  Take 1 tablet (81 mg total) by mouth daily. Swallow whole. 30 tablet 11  . cholecalciferol (VITAMIN D3) 25 MCG (1000 UNIT) tablet Take 1,000 Units by mouth daily.    . sennosides-docusate sodium (SENOKOT-S) 8.6-50 MG tablet Take 1 tablet by mouth at bedtime.    . simvastatin (ZOCOR) 40 MG tablet     . sulindac (CLINORIL) 200 MG tablet Take 200 mg by mouth 2 (two) times daily.    . vitamin B-12 (CYANOCOBALAMIN) 100 MCG tablet Take 100 mcg by mouth daily.     No current facility-administered medications on file prior to visit.        ROS:  All others reviewed and negative.  Objective        PE:  BP 118/68 (BP Location: Left Arm, Patient Position: Sitting, Cuff Size: Large)   Pulse 60   Temp 98.6 F (37 C) (Oral)   Ht 6' (1.829 m)   Wt 246 lb 12.8 oz (111.9 kg)   SpO2 93%   BMI 33.47 kg/m                 Constitutional: Pt appears in NAD               HENT: Head: NCAT.                Right Ear: External ear normal.                 Left Ear: External ear normal.                Eyes: . Pupils are equal,  round, and reactive to light. Conjunctivae and EOM are normal               Nose: without d/c or deformity               Neck: Neck supple. Gross normal ROM               Cardiovascular: Normal rate and regular rhythm.                 Pulmonary/Chest: Effort normal and breath sounds without rales or wheezing.                Abd:  Soft, NT, ND, + BS, no organomegaly               Neurological: Pt is alert. At baseline orientation, motor grossly intact               Skin: Skin is warm. No rashes, no other new lesions, LE edema - none               Psychiatric: Pt behavior is normal without agitation   Micro: none  Cardiac tracings I have personally interpreted today:  none  Pertinent Radiological findings (summarize): none   Lab Results  Component Value Date   WBC 9.4 08/29/2020   HGB 13.6 08/29/2020   HCT 42.3 08/29/2020   PLT 222 08/29/2020   GLUCOSE 103 (H) 08/29/2020   CHOL 115 10/15/2020   TRIG 52 10/15/2020   HDL 30 (L) 10/15/2020   LDLCALC 73 10/15/2020   ALT 17 10/15/2020   AST 18 10/15/2020   NA 134 (L) 08/29/2020   K 4.3 08/29/2020   CL 103 08/29/2020   CREATININE 0.78 08/29/2020   BUN 13 08/29/2020   CO2 24 08/29/2020   TSH 1.37 08/29/2019   PSA 0.83 08/29/2019   INR 1.1 06/24/2019  HGBA1C 5.5 08/29/2019   Assessment/Plan:  RASHIDI LOH is a 75 y.o. White or Caucasian [1] male with  has a past medical history of ALLERGIC RHINITIS (08/20/2007), Anemia, Arthritis, Asthma, Barrett esophagus, BENIGN PROSTATIC HYPERTROPHY (08/20/2007), Coronary artery disease involving native heart with angina pectoris ([place]), ELBOW PAIN, LEFT (04/15/2008), ERECTILE DYSFUNCTION (08/20/2007), Gallbladder polyp (08/27/2015), GERD (gastroesophageal reflux disease), Hepatic cyst (09/06/2015), History of cardiomegaly (05/15/2013), History of meningitis, Hyperlipidemia LDL goal <70, HYPERTENSION, INSOMNIA-SLEEP DISORDER-UNSPEC (03/17/2008), Microcytosis, Mild dilation of ascending aorta ([place]),  Myocardial infarction [facility]) (June 1995), OSA on CPAP, PREMATURE VENTRICULAR CONTRACTIONS (08/20/2007), SCROTAL MASS (10/09/2008), URINARY INCONTINENCE, MALE (10/09/2008), and UTI (04/24/2009).  Preventative health care Age and sex appropriate education and counseling updated with regular exercise and diet Referrals for preventative services - declines colonoscopy for now Immunizations addressed - none needed Smoking counseling  - none needed Evidence for depression or other mood disorder - none significant Most recent labs reviewed. I have personally reviewed and have noted: 1) the patient's medical and social history 2) The patient's current medications and supplements 3) The patient's height, weight, and BMI have been recorded in the chart   Impaired glucose tolerance Lab Results  Component Value Date   HGBA1C 5.5 08/29/2019   Stable, pt to continue current medical treatment  - diet and wt control   Essential hypertension BP Readings from Last 3 Encounters:  12/07/20 118/68  09/23/20 122/78  08/29/20 (!) 142/83   Stable, pt to continue medical treatment norvasc, lisinopril, tenormin   Followup: Return in about 6 months (around 06/09/2021).  Cathlean Cower, MD 12/07/2020 7:34 PM [facility]

## 2020-12-07 NOTE — Assessment & Plan Note (Signed)
BP Readings from Last 3 Encounters:  12/07/20 118/68  09/23/20 122/78  08/29/20 (!) 142/83   Stable, pt to continue medical treatment norvasc, lisinopril, tenormin

## 2020-12-07 NOTE — Patient Instructions (Addendum)
Please continue all other medications as before, and refills have been done if requested.  Please have the pharmacy call with any other refills you may need.  Please continue your efforts at being more active, low cholesterol diet, and weight control.  You are otherwise up to date with prevention measures today.  Please keep your appointments with your specialists as you may have planned  Please go to the LAB at the blood drawing area for the tests to be done - at the [facility] at your convenience  You will be contacted by phone if any changes need to be made immediately.  Otherwise, you will receive a letter about your results with an explanation, but please check with MyChart first.  Please remember to sign up for MyChart if you have not done so, as this will be important to you in the future with finding out test results, communicating by private email, and scheduling acute appointments online when needed.  Please make an Appointment to return in 6 months, or sooner if needed

## 2020-12-07 NOTE — Assessment & Plan Note (Signed)
Age and sex appropriate education and counseling updated with regular exercise and diet °Referrals for preventative services - declines colonoscopy for now °Immunizations addressed - none needed °Smoking counseling  - none needed °Evidence for depression or other mood disorder - none significant °Most recent labs reviewed. °I have personally reviewed and have noted: °1) the patient's medical and social history °2) The patient's current medications and supplements °3) The patient's height, weight, and BMI have been recorded in the chart ° °

## 2020-12-08 ENCOUNTER — Encounter: Payer: Self-pay | Admitting: Internal Medicine

## 2020-12-08 ENCOUNTER — Other Ambulatory Visit: Payer: Self-pay | Admitting: Internal Medicine

## 2020-12-08 ENCOUNTER — Other Ambulatory Visit (INDEPENDENT_AMBULATORY_CARE_PROVIDER_SITE_OTHER): Payer: Medicare HMO

## 2020-12-08 DIAGNOSIS — Z125 Encounter for screening for malignant neoplasm of prostate: Secondary | ICD-10-CM

## 2020-12-08 DIAGNOSIS — E559 Vitamin D deficiency, unspecified: Secondary | ICD-10-CM

## 2020-12-08 DIAGNOSIS — Z0000 Encounter for general adult medical examination without abnormal findings: Secondary | ICD-10-CM | POA: Diagnosis not present

## 2020-12-08 DIAGNOSIS — E538 Deficiency of other specified B group vitamins: Secondary | ICD-10-CM | POA: Diagnosis not present

## 2020-12-08 DIAGNOSIS — R7302 Impaired glucose tolerance (oral): Secondary | ICD-10-CM | POA: Diagnosis not present

## 2020-12-08 DIAGNOSIS — R3129 Other microscopic hematuria: Secondary | ICD-10-CM

## 2020-12-08 LAB — HEPATIC FUNCTION PANEL
ALT: 18 U/L (ref 0–53)
AST: 19 U/L (ref 0–37)
Albumin: 4.3 g/dL (ref 3.5–5.2)
Alkaline Phosphatase: 110 U/L (ref 39–117)
Bilirubin, Direct: 0.1 mg/dL (ref 0.0–0.3)
Total Bilirubin: 0.5 mg/dL (ref 0.2–1.2)
Total Protein: 6.9 g/dL (ref 6.0–8.3)

## 2020-12-08 LAB — CBC WITH DIFFERENTIAL/PLATELET
Basophils Absolute: 0.1 10*3/uL (ref 0.0–0.1)
Basophils Relative: 0.8 % (ref 0.0–3.0)
Eosinophils Absolute: 0.2 10*3/uL (ref 0.0–0.7)
Eosinophils Relative: 3 % (ref 0.0–5.0)
HCT: 41.8 % (ref 39.0–52.0)
Hemoglobin: 13.6 g/dL (ref 13.0–17.0)
Lymphocytes Relative: 30 % (ref 12.0–46.0)
Lymphs Abs: 2 10*3/uL (ref 0.7–4.0)
MCHC: 32.6 g/dL (ref 30.0–36.0)
MCV: 72.3 fl — ABNORMAL LOW (ref 78.0–100.0)
Monocytes Absolute: 0.7 10*3/uL (ref 0.1–1.0)
Monocytes Relative: 10.5 % (ref 3.0–12.0)
Neutro Abs: 3.8 10*3/uL (ref 1.4–7.7)
Neutrophils Relative %: 55.7 % (ref 43.0–77.0)
Platelets: 236 10*3/uL (ref 150.0–400.0)
RBC: 5.78 Mil/uL (ref 4.22–5.81)
RDW: 16.4 % — ABNORMAL HIGH (ref 11.5–15.5)
WBC: 6.8 10*3/uL (ref 4.0–10.5)

## 2020-12-08 LAB — URINALYSIS, ROUTINE W REFLEX MICROSCOPIC
Bilirubin Urine: NEGATIVE
Hgb urine dipstick: NEGATIVE
Leukocytes,Ua: NEGATIVE
Nitrite: NEGATIVE
Specific Gravity, Urine: 1.02 (ref 1.000–1.030)
Total Protein, Urine: NEGATIVE
Urine Glucose: NEGATIVE
Urobilinogen, UA: 4 — AB (ref 0.0–1.0)
pH: 6 (ref 5.0–8.0)

## 2020-12-08 LAB — VITAMIN D 25 HYDROXY (VIT D DEFICIENCY, FRACTURES): VITD: 64.46 ng/mL (ref 30.00–100.00)

## 2020-12-08 LAB — BASIC METABOLIC PANEL
BUN: 21 mg/dL (ref 6–23)
CO2: 27 mEq/L (ref 19–32)
Calcium: 9 mg/dL (ref 8.4–10.5)
Chloride: 103 mEq/L (ref 96–112)
Creatinine, Ser: 1.01 mg/dL (ref 0.40–1.50)
GFR: 73.36 mL/min (ref >60.00)
Glucose, Bld: 100 mg/dL — ABNORMAL HIGH (ref 70–99)
Potassium: 4.5 mEq/L (ref 3.5–5.1)
Sodium: 138 mEq/L (ref 135–145)

## 2020-12-08 LAB — VITAMIN B12: Vitamin B-12: >1506 pg/mL — ABNORMAL HIGH (ref 211–911)

## 2020-12-08 LAB — LIPID PANEL
Cholesterol: 121 mg/dL (ref 0–200)
HDL: 31.7 mg/dL — ABNORMAL LOW (ref >39.00)
NonHDL: 89.51
Total CHOL/HDL Ratio: 4
Triglycerides: 210 mg/dL — ABNORMAL HIGH (ref 0.0–149.0)
VLDL: 42 mg/dL — ABNORMAL HIGH (ref 0.0–40.0)

## 2020-12-08 LAB — PSA: PSA: 0.91 ng/mL (ref 0.10–4.00)

## 2020-12-08 LAB — LDL CHOLESTEROL, DIRECT: Direct LDL: 71 mg/dL

## 2020-12-08 LAB — TSH: TSH: 1.81 u[IU]/mL (ref 0.35–4.50)

## 2020-12-08 LAB — HEMOGLOBIN A1C: Hgb A1c MFr Bld: 6.3 % (ref 4.6–6.5)

## 2020-12-09 ENCOUNTER — Ambulatory Visit (HOSPITAL_COMMUNITY): Payer: Medicare HMO

## 2020-12-11 ENCOUNTER — Ambulatory Visit (HOSPITAL_COMMUNITY): Payer: Medicare HMO

## 2020-12-14 ENCOUNTER — Ambulatory Visit (HOSPITAL_COMMUNITY): Payer: Medicare HMO

## 2020-12-16 ENCOUNTER — Ambulatory Visit (HOSPITAL_COMMUNITY): Payer: Medicare HMO

## 2020-12-18 ENCOUNTER — Ambulatory Visit (HOSPITAL_COMMUNITY): Payer: Medicare HMO

## 2020-12-21 ENCOUNTER — Ambulatory Visit (HOSPITAL_COMMUNITY): Payer: Medicare HMO

## 2020-12-23 ENCOUNTER — Ambulatory Visit (HOSPITAL_COMMUNITY): Payer: Medicare HMO

## 2020-12-25 ENCOUNTER — Ambulatory Visit (HOSPITAL_COMMUNITY): Payer: Medicare HMO

## 2020-12-28 ENCOUNTER — Ambulatory Visit (HOSPITAL_COMMUNITY): Payer: Medicare HMO

## 2020-12-30 ENCOUNTER — Ambulatory Visit (HOSPITAL_COMMUNITY): Payer: Medicare HMO

## 2021-01-01 ENCOUNTER — Ambulatory Visit (HOSPITAL_COMMUNITY): Payer: Medicare HMO

## 2021-01-04 ENCOUNTER — Ambulatory Visit (HOSPITAL_COMMUNITY): Payer: Medicare HMO

## 2021-01-06 ENCOUNTER — Ambulatory Visit (HOSPITAL_COMMUNITY): Payer: Medicare HMO

## 2021-01-08 ENCOUNTER — Ambulatory Visit (HOSPITAL_COMMUNITY): Payer: Medicare HMO

## 2021-01-11 ENCOUNTER — Ambulatory Visit (HOSPITAL_COMMUNITY): Payer: Medicare HMO

## 2021-01-13 ENCOUNTER — Ambulatory Visit (HOSPITAL_COMMUNITY): Payer: Medicare HMO

## 2021-01-15 ENCOUNTER — Ambulatory Visit (HOSPITAL_COMMUNITY): Payer: Medicare HMO

## 2021-01-18 NOTE — Progress Notes (Addendum)
Cardiology Office Note:    Date:  01/19/2021   ID:  SHREYANSH CLEMENSON, DOB [birth year], MRN [id number]  PCP:  Biagio Borg, MD   [facility] Providers Cardiologist:  Sherren Mocha, MD    Referring MD: Biagio Borg, MD   Chief Complaint:  Follow-up (CAD)    Patient Profile:    Robert [name] is a 75 y.o. male with:   Coronary artery disease  ? S/p CABG in 1989 ? S/p multiple PCIs since CABG ? Cath 9/18: OM2 stent patent, S-RCA stent patent, L-LAD patent, S-Dx 24 (old), EF 50-55 ? NSTEMI 08/2020 s/p DES to pLCx ? Residual at cath: L-LAD patent, S-PDA patent with 50 ISR, mLCx stent patent  Hypertension   Hyperlipidemia   PVCs  GERD  Ascending aorta and SoV (42 mm) dilation (Echocardiogram 12/21)  Prior CV studies: LEFT HEART CATH AND CORS/GRAFTS ANGIOGRAPHY 08/28/2020 Narrative 1.  Severe native three-vessel coronary artery disease with total occlusion of the proximal LAD, total occlusion of the mid RCA, and severe stenosis of the proximal circumflex; mid LCx stent patent 2.  Status post aortocoronary bypass surgery with continued patency of the LIMA to LAD graft and saphenous vein graft to PDA.  The PDA graft has moderate restenosis in the proximal body within the previously implanted stent.  This is unchanged over multiple cath studies.  There is also some mild disease in the distal vessel beyond the graft insertion site, also unchanged.  This is appropriate for medical therapy.  S-Dx known to be occluded  3.  Successful PCI of the left circumflex with a 3.0 x 26 mm resolute Onyx DES for treatment of progressive 90% stenosis, reduced to 0% with TIMI-3 flow pre and post. Recommendations: Continue dual antiplatelet therapy with aspirin and clopidogrel at least 12 months, favor long-term therapy if tolerated.  As long as no complications arise, patient can be discharged tomorrow morning.   Echocardiogram 09/07/20 EF 60-65, no RWMA, mild LVH, GR 1 DD, normal RVSF,  mild LAE, trivial MR, trivial AI, mild dilation at sinuses of Valsalva (42 mm)   Cardiac catheterization 06/08/17 LAD ostial 100, distal 30 LCx okay, OM 2 stent patent then 40 RCA proximal 80, mid 100 SVG-distal RCA stent patent with 50 ISR  LIMA-LAD patent SVG-diagonal known to be occluded EF 50-55   History of Present Illness: Robert [name] was last seen in clinic in 09/2020.  He returns for f/u.  He is here alone.  He is doing well without anginal symptoms.  He has not had chest discomfort or shortness of breath.  He has not had orthopnea, syncope.  He has some chronic swelling in his left leg.  He has had left knee surgery as well as left ankle surgery.        Past Medical History:  Diagnosis Date  . ALLERGIC RHINITIS 08/20/2007  . Anemia   . Arthritis    "back; knees; shoulders"  (06/08/2017)  . Asthma    "very slight"   . Barrett esophagus   . BENIGN PROSTATIC HYPERTROPHY 08/20/2007  . Coronary artery disease involving native heart with angina pectoris ([place])    a. s/p CABG x 3 in 1989;  b. MI in 1995 with BMS to VG->RCA;  c. 07/2000 PCI/BMS to native LCX;  d. Known CTO of VG->Diag - multiple caths since then. e. PCI/DES to LCx 08/2020.  . ELBOW PAIN, LEFT 04/15/2008  . ERECTILE DYSFUNCTION 08/20/2007  . Gallbladder polyp 08/27/2015   Noted on  [place] Abd  . GERD (gastroesophageal reflux disease)   . Hepatic cyst 09/06/2015   Large simple right hepatic lobe, noted on [place] Abd  . History of cardiomegaly 05/15/2013   Noted on CXR  . History of meningitis    2nd grade, 8th grade, 9th grade, age 89  . Hyperlipidemia LDL goal <70   . HYPERTENSION    "been on RX since CABG but never a problem" (06/08/2017)  . INSOMNIA-SLEEP DISORDER-UNSPEC 03/17/2008  . Microcytosis   . Mild dilation of ascending aorta (HCC)   . Myocardial infarction [facility]) June 1995  . OSA on CPAP   . PREMATURE VENTRICULAR CONTRACTIONS 08/20/2007  . SCROTAL MASS 10/09/2008  . URINARY INCONTINENCE, MALE 10/09/2008  . UTI  04/24/2009   "just this once" (05/15/2013)    Current Medications: Current Meds  Medication Sig  . albuterol (VENTOLIN HFA) 108 (90 Base) MCG/ACT inhaler Inhale 1-2 puffs into the lungs every 6 (six) hours as needed for wheezing or shortness of breath.  Marland Kitchen amitriptyline (ELAVIL) 25 MG tablet Take 1 tablet (25 mg total) by mouth at bedtime. Reported on 01/25/2016  . amLODipine (NORVASC) 5 MG tablet Take 1 tablet (5 mg total) by mouth daily.  Marland Kitchen aspirin EC 81 MG EC tablet Take 1 tablet (81 mg total) by mouth daily. Swallow whole.  Marland Kitchen atenolol (TENORMIN) 25 MG tablet Take 1 tablet (25 mg total) by mouth at bedtime.  . cholecalciferol (VITAMIN D3) 25 MCG (1000 UNIT) tablet Take 1,000 Units by mouth daily.  . clopidogrel (PLAVIX) 75 MG tablet Take 1 tablet (75 mg total) by mouth daily.  Marland Kitchen ezetimibe (ZETIA) 10 MG tablet Take 1 tablet (10 mg total) by mouth daily.  . famotidine (PEPCID) 20 MG tablet Take 1 tablet (20 mg total) by mouth 2 (two) times daily.  . isosorbide mononitrate (IMDUR) 30 MG 24 hr tablet Take 1 tablet (30 mg total) by mouth 2 (two) times daily.  Marland Kitchen lisinopril (ZESTRIL) 10 MG tablet Take 1 tablet (10 mg total) by mouth at bedtime.  . nitroGLYCERIN (NITROSTAT) 0.4 MG SL tablet Place 1 tablet (0.4 mg total) under the tongue every 5 (five) minutes as needed for chest pain.  Marland Kitchen sennosides-docusate sodium (SENOKOT-S) 8.6-50 MG tablet Take 1 tablet by mouth at bedtime.  . simvastatin (ZOCOR) 40 MG tablet   . sulindac (CLINORIL) 200 MG tablet Take 200 mg by mouth 2 (two) times daily.  . temazepam (RESTORIL) 30 MG capsule TAKE 1 CAPSULE BY MOUTH AT BEDTIME AS NEEDED FOR SLEEP  . vitamin B-12 (CYANOCOBALAMIN) 100 MCG tablet Take 100 mcg by mouth daily.  . [DISCONTINUED] omeprazole (PRILOSEC) 20 MG capsule TAKE ONE CAPSULE BY MOUTH once daily     Allergies:   Metoprolol, Lipitor [atorvastatin], and Adhesive [tape]   Social History   Tobacco Use  . Smoking status: Former Smoker    Packs/day:  1.00    Years: 45.00    Pack years: 45.00    Types: Cigarettes    Quit date: 04/23/1994    Years since quitting: 26.7  . Smokeless tobacco: Never Used  Vaping Use  . Vaping Use: Never used  Substance Use Topics  . Alcohol use: Not Currently    Comment:  "I've  quit drinking in 1989"  . Drug use: No     Family Hx: The patient's family history includes Coronary artery disease in his father and mother; Esophageal cancer in his brother; Heart disease in his brother.  Review of Systems  Gastrointestinal:  Negative for hematochezia and melena.  Genitourinary: Negative for hematuria.     EKGs/Labs/Other Test Reviewed:    EKG:  EKG is not  ordered today.  The ekg ordered today demonstrates n/a  Recent Labs: 12/08/2020: ALT 18; BUN 21; Creatinine, Ser 1.01; Hemoglobin 13.6; Platelets 236.0; Potassium 4.5; Sodium 138; TSH 1.81   Recent Lipid Panel Lab Results  Component Value Date/Time   CHOL 121 12/08/2020 01:31 PM   CHOL 115 10/15/2020 09:47 AM   TRIG 210.0 (H) 12/08/2020 01:31 PM   HDL 31.70 (L) 12/08/2020 01:31 PM   HDL 30 (L) 10/15/2020 09:47 AM   CHOLHDL 4 12/08/2020 01:31 PM   LDLCALC 73 10/15/2020 09:47 AM   LDLDIRECT 71.0 12/08/2020 01:31 PM      Risk Assessment/Calculations:      Physical Exam:    VS:  BP 120/70   Pulse 64   Ht 6\' 5"  (1.956 m)   Wt 249 lb 12.8 oz (113.3 kg)   SpO2 97%   BMI 29.62 kg/m     Wt Readings from Last 3 Encounters:  01/19/21 249 lb 12.8 oz (113.3 kg)  12/07/20 246 lb 12.8 oz (111.9 kg)  09/23/20 247 lb (112 kg)     Constitutional:      Appearance: Healthy appearance. Not in distress.  Neck:     Vascular: JVD normal.  Pulmonary:     Effort: Pulmonary effort is normal.     Breath sounds: No wheezing. No rales.  Cardiovascular:     Normal rate. Regular rhythm. Normal S1. Normal S2.     Murmurs: There is no murmur.  Edema:    Pretibial: bilateral trace edema of the pretibial area. Abdominal:     General: There is no  distension.     Palpations: Abdomen is soft.  Skin:    General: Skin is warm and dry.  Neurological:     General: No focal deficit present.     Mental Status: Alert and oriented to person, place and time.     Cranial Nerves: Cranial nerves are intact.         ASSESSMENT & PLAN:    1. Coronary artery disease involving native coronary artery of native heart without angina pectoris History of CABG in 1989 and multiple subsequent PCI procedures.  He had a non-STEMI in 08/2020 and underwent PCI with a DES to the LCx.  He is doing well without anginal symptoms.  Continue current dose of aspirin, clopidogrel, atenolol, amlodipine, isosorbide, simvastatin.  Follow-up in 08/2021.  2. Essential hypertension The patient's blood pressure is controlled on his current regimen.  Continue current therapy.   3. Hyperlipidemia LDL goal <70 Recent LDL above goal at 71.  Given his history, I think we should be aggressive in getting his LDL below 70.  We discussed switching simvastatin to rosuvastatin.  However, he has several bottles of simvastatin at home.  He had issues with atorvastatin in the past.  I will place him on ezetimibe 10 mg daily.  Obtain follow-up lipids and LFTs in 3 months.  4. Mild dilation of ascending aorta (HCC) Sinuses of Valsalva 42 mm on recent echocardiogram.  Repeat echocardiogram in 08/2021 prior to follow-up visit.  Consider MRA if appears larger.    5. GERD He continues to take omeprazole.  We discussed the potential interaction with clopidogrel. He is agreeable to try Famotidine.  He had no relief of symptoms in the past with Pantoprazole.    -DC Omeprazole  -Start Famotidine 20  mg twice daily   Dispo:  Return in about 7 months (around 08/21/2021) for Routine Follow Up, w/ Dr. Burt Knack, in person.   Medication Adjustments/Labs and Tests Ordered: Current medicines are reviewed at length with the patient today.  Concerns regarding medicines are outlined above.  Tests  Ordered: Orders Placed This Encounter  Procedures  . Lipid Profile  . Hepatic function panel  . Lipid Profile  . Hepatic function panel  . ECHOCARDIOGRAM COMPLETE   Medication Changes: Meds ordered this encounter  Medications  . famotidine (PEPCID) 20 MG tablet    Sig: Take 1 tablet (20 mg total) by mouth 2 (two) times daily.    Dispense:  120 tablet    Refill:  3  . ezetimibe (ZETIA) 10 MG tablet    Sig: Take 1 tablet (10 mg total) by mouth daily.    Dispense:  90 tablet    Refill:  3    Signed, Robert Dopp, PA-C  01/19/2021 12:47 PM    [facility] [place], [place], [place]  [address] Phone: [phone]; Fax: [phone]

## 2021-01-19 ENCOUNTER — Ambulatory Visit: Payer: Medicare HMO | Admitting: Physician Assistant

## 2021-01-19 ENCOUNTER — Encounter: Payer: Self-pay | Admitting: Physician Assistant

## 2021-01-19 ENCOUNTER — Other Ambulatory Visit: Payer: Self-pay

## 2021-01-19 VITALS — BP 120/70 | HR 64 | Ht 77.0 in | Wt 249.8 lb

## 2021-01-19 DIAGNOSIS — I10 Essential (primary) hypertension: Secondary | ICD-10-CM

## 2021-01-19 DIAGNOSIS — K219 Gastro-esophageal reflux disease without esophagitis: Secondary | ICD-10-CM | POA: Diagnosis not present

## 2021-01-19 DIAGNOSIS — I77810 Thoracic aortic ectasia: Secondary | ICD-10-CM | POA: Diagnosis not present

## 2021-01-19 DIAGNOSIS — I2510 Atherosclerotic heart disease of native coronary artery without angina pectoris: Secondary | ICD-10-CM | POA: Diagnosis not present

## 2021-01-19 DIAGNOSIS — E785 Hyperlipidemia, unspecified: Secondary | ICD-10-CM | POA: Diagnosis not present

## 2021-01-19 MED ORDER — EZETIMIBE 10 MG PO TABS: 10.0000 mg | ORAL_TABLET | Freq: Every day | ORAL | 3 refills | Status: DC

## 2021-01-19 MED ORDER — FAMOTIDINE 20 MG PO TABS: 20.0000 mg | ORAL_TABLET | Freq: Two times a day (BID) | ORAL | 3 refills | Status: DC

## 2021-01-19 NOTE — Patient Instructions (Addendum)
Medication Instructions:   Your physician has recommended you make the following change in your medication:   1.  Discontinue omeprazole  2.  Start Famotidine( pepcid) one tablet by mouth ( 20 mg) twice daily.  3.  Start Zetia one tablet ( 10 mg) daily, sent both medications in for # 90 to requested pharmacy.     *If you need a refill on your cardiac medications before your next appointment, please call your pharmacy*   Lab Work: Your physician recommends that you return for a FASTING lipid profile: LIPID/LFT on Wednesday, August 17 fasting from 12 midnight the night before between 7:30 - 4:30.    If you have labs (blood work) drawn today and your tests are completely normal, you will receive your results only by: [place] MyChart Message (if you have MyChart) OR . A paper copy in the mail If you have any lab test that is abnormal or we need to change your treatment, we will call you to review the results.   Testing/Procedures: Your physician has requested that you have an echocardiogram. Tuesday, December 13 @ 9:15 a week prior than appointment with Dr.Cooper.  Echocardiography is a painless test that uses sound waves to create images of your heart. It provides your doctor with information about the size and shape of your heart and how well your heart's chambers and valves are working. This procedure takes approximately one hour. There are no restrictions for this procedure.   Echocardiogram An echocardiogram is a test that uses sound waves (ultrasound) to produce images of the heart. Images from an echocardiogram can provide important information about:  Heart size and shape.  The size and thickness and movement of your heart's walls.  Heart muscle function and strength.  Heart valve function or if you have stenosis. Stenosis is when the heart valves are too narrow.  If blood is flowing backward through the heart valves (regurgitation).  A tumor or infectious growth around the  heart valves.  Areas of heart muscle that are not working well because of poor blood flow or injury from a heart attack.  Aneurysm detection. An aneurysm is a weak or damaged part of an artery wall. The wall bulges out from the normal force of blood pumping through the body. Tell a health care provider about:  Any allergies you have.  All medicines you are taking, including vitamins, herbs, eye drops, creams, and over-the-counter medicines.  Any blood disorders you have.  Any surgeries you have had.  Any medical conditions you have.  Whether you are pregnant or may be pregnant. What are the risks? Generally, this is a safe test. However, problems may occur, including an allergic reaction to dye (contrast) that may be used during the test. What happens before the test? No specific preparation is needed. You may eat and drink normally. What happens during the test?  You will take off your clothes from the waist up and put on a hospital gown.  Electrodes or electrocardiogram (ECG)patches may be placed on your chest. The electrodes or patches are then connected to a device that monitors your heart rate and rhythm.  You will lie down on a table for an ultrasound exam. A gel will be applied to your chest to help sound waves pass through your skin.  A handheld device, called a transducer, will be pressed against your chest and moved over your heart. The transducer produces sound waves that travel to your heart and bounce back (or "echo" back) to  the transducer. These sound waves will be captured in real-time and changed into images of your heart that can be viewed on a video monitor. The images will be recorded on a computer and reviewed by your health care provider.  You may be asked to change positions or hold your breath for a short time. This makes it easier to get different views or better views of your heart.  In some cases, you may receive contrast through an IV in one of your  veins. This can improve the quality of the pictures from your heart. The procedure may vary among health care providers and hospitals.   What can I expect after the test? You may return to your normal, everyday life, including diet, activities, and medicines, unless your health care provider tells you not to do that. Follow these instructions at home:  It is up to you to get the results of your test. Ask your health care provider, or the department that is doing the test, when your results will be ready.  Keep all follow-up visits. This is important. Summary  An echocardiogram is a test that uses sound waves (ultrasound) to produce images of the heart.  Images from an echocardiogram can provide important information about the size and shape of your heart, heart muscle function, heart valve function, and other possible heart problems.  You do not need to do anything to prepare before this test. You may eat and drink normally.  After the echocardiogram is completed, you may return to your normal, everyday life, unless your health care provider tells you not to do that. This information is not intended to replace advice given to you by your health care provider. Make sure you discuss any questions you have with your health care provider. Document Revised: 04/28/2020 Document Reviewed: 04/28/2020 Elsevier Patient Education  2021 [place]: At [facility], you and your health needs are our priority.  As part of our continuing mission to provide you with exceptional heart care, we have created designated Provider Care Teams.  These Care Teams include your primary Cardiologist (physician) and Advanced Practice Providers (APPs -  Physician Assistants and Nurse Practitioners) who all work together to provide you with the care you need, when you need it.  We recommend signing up for the patient portal called "MyChart".  Sign up information is provided on this After Visit  Summary.  MyChart is used to connect with patients for Virtual Visits (Telemedicine).  Patients are able to view lab/test results, encounter notes, upcoming appointments, etc.  Non-urgent messages can be sent to your provider as well.   To learn more about what you can do with MyChart, go to [email or web address].    Your next appointment:   7 month(s)  The format for your next appointment:   In Person  Provider:   Sherren Mocha, MD   Other Instructions Your physician wants you to follow-up in: 7 month with Dr.Cooper one week after echo.  You will receive a reminder letter in the mail two months in advance. If you don't receive a letter, please call our office to schedule the follow-up appointment.

## 2021-03-02 ENCOUNTER — Telehealth: Payer: Self-pay | Admitting: Internal Medicine

## 2021-03-02 ENCOUNTER — Telehealth: Payer: Self-pay | Admitting: Cardiovascular Disease

## 2021-03-02 NOTE — Telephone Encounter (Signed)
   Patient has an appointment with a specialist on 6/15. Requesting labs from March 22, be faxed to Dr Percell Miller at [phone]

## 2021-03-02 NOTE — Telephone Encounter (Signed)
Pt is requesting Lab work  results from 08/28/2020 sent to [facility] in [place], [place] Dr. Brett Canales [phone]. Pt has an appt at the Clinic tomorrow evening at 3pm

## 2021-03-02 NOTE — Telephone Encounter (Signed)
Results sent as requested

## 2021-03-04 ENCOUNTER — Telehealth: Payer: Self-pay | Admitting: Physician Assistant

## 2021-03-04 NOTE — Telephone Encounter (Signed)
New Message:     Pt called and said he thought he need an appt with Dr Burt Knack or Nicki Reaper . He said Nicki Reaper  said he needs an appt after his lab work in August. I told him the recall said October or November. He said to find out from [facility] when does he need his next appt?

## 2021-03-04 NOTE — Telephone Encounter (Signed)
Fax sent.

## 2021-03-04 NOTE — Telephone Encounter (Signed)
Reviewed with patient his upcoming appointments -labs in Aug, echo 12/13 and recommendation to see Dr. Burt Knack after that.  There is no availability in Dec for Dr. Burt Knack. Will send message to Dr. Antionette Char nurse to see if any availability earlier in Dec and if so echo could be rescheduled.  He is not opposed to APP but he was instructed by Nicki Reaper to see Dr. Burt Knack.

## 2021-03-05 NOTE — Telephone Encounter (Signed)
Rescheduled the patient's echo to 08/26/2021. Scheduled him with Dr. Burt Knack 08/30/21 to review results. He was grateful for call and agrees with plan.

## 2021-03-18 ENCOUNTER — Other Ambulatory Visit: Payer: Self-pay | Admitting: Cardiovascular Disease

## 2021-03-18 ENCOUNTER — Other Ambulatory Visit: Payer: Self-pay | Admitting: Internal Medicine

## 2021-03-18 NOTE — Telephone Encounter (Signed)
Please refill as per office routine med refill policy (all routine meds refilled for 3 mo or monthly per pt preference up to one year from last visit, then month to month grace period for 3 mo, then further med refills will have to be denied)  

## 2021-05-05 ENCOUNTER — Other Ambulatory Visit: Payer: Self-pay

## 2021-05-05 ENCOUNTER — Other Ambulatory Visit: Payer: Medicare HMO | Admitting: *Deleted

## 2021-05-05 DIAGNOSIS — I10 Essential (primary) hypertension: Secondary | ICD-10-CM | POA: Diagnosis not present

## 2021-05-05 DIAGNOSIS — I77810 Thoracic aortic ectasia: Secondary | ICD-10-CM | POA: Diagnosis not present

## 2021-05-05 DIAGNOSIS — E785 Hyperlipidemia, unspecified: Secondary | ICD-10-CM | POA: Diagnosis not present

## 2021-05-05 DIAGNOSIS — I2510 Atherosclerotic heart disease of native coronary artery without angina pectoris: Secondary | ICD-10-CM | POA: Diagnosis not present

## 2021-05-05 LAB — HEPATIC FUNCTION PANEL
ALT: 21 IU/L (ref 0–44)
AST: 26 IU/L (ref 0–40)
Albumin: 4.6 g/dL (ref 3.7–4.7)
Alkaline Phosphatase: 92 IU/L (ref 44–121)
Bilirubin Total: 0.4 mg/dL (ref 0.0–1.2)
Bilirubin, Direct: 0.16 mg/dL (ref 0.00–0.40)
Total Protein: 7 g/dL (ref 6.0–8.5)

## 2021-05-05 LAB — LIPID PANEL
Chol/HDL Ratio: 3.5 ratio (ref 0.0–5.0)
Cholesterol, Total: 118 mg/dL (ref 100–199)
HDL: 34 mg/dL — ABNORMAL LOW (ref >39)
LDL Chol Calc (NIH): 69 mg/dL (ref 0–99)
Triglycerides: 71 mg/dL (ref 0–149)
VLDL Cholesterol Cal: 15 mg/dL (ref 5–40)

## 2021-06-09 ENCOUNTER — Other Ambulatory Visit: Payer: Self-pay | Admitting: Internal Medicine

## 2021-06-09 ENCOUNTER — Other Ambulatory Visit: Payer: Self-pay | Admitting: Physician Assistant

## 2021-06-22 ENCOUNTER — Other Ambulatory Visit: Payer: Self-pay | Admitting: Internal Medicine

## 2021-06-22 DIAGNOSIS — I2510 Atherosclerotic heart disease of native coronary artery without angina pectoris: Secondary | ICD-10-CM

## 2021-07-02 ENCOUNTER — Telehealth: Payer: Self-pay | Admitting: Internal Medicine

## 2021-07-02 NOTE — Telephone Encounter (Signed)
[place] advising [place] to be on the look out for a statin medication form they faxed over on behalf of patient

## 2021-07-09 ENCOUNTER — Other Ambulatory Visit: Payer: Self-pay | Admitting: Internal Medicine

## 2021-07-09 NOTE — Telephone Encounter (Signed)
Please refill as per office routine med refill policy (all routine meds to be refilled for 3 mo or monthly (per pt preference) up to one year from last visit, then month to month grace period for 3 mo, then further med refills will have to be denied) ? ?

## 2021-07-09 NOTE — Telephone Encounter (Signed)
Done

## 2021-08-26 ENCOUNTER — Other Ambulatory Visit: Payer: Self-pay

## 2021-08-26 ENCOUNTER — Ambulatory Visit (HOSPITAL_COMMUNITY): Payer: Medicare HMO | Attending: Cardiovascular Disease

## 2021-08-26 DIAGNOSIS — I10 Essential (primary) hypertension: Secondary | ICD-10-CM | POA: Insufficient documentation

## 2021-08-26 DIAGNOSIS — I2510 Atherosclerotic heart disease of native coronary artery without angina pectoris: Secondary | ICD-10-CM | POA: Insufficient documentation

## 2021-08-26 DIAGNOSIS — I77810 Thoracic aortic ectasia: Secondary | ICD-10-CM | POA: Diagnosis not present

## 2021-08-26 DIAGNOSIS — E785 Hyperlipidemia, unspecified: Secondary | ICD-10-CM | POA: Diagnosis not present

## 2021-08-26 LAB — ECHOCARDIOGRAM COMPLETE
Area-P 1/2: 5.27 cm2
S' Lateral: 3.7 cm

## 2021-08-26 MED ORDER — PERFLUTREN LIPID MICROSPHERE
1.0000 mL | INTRAVENOUS | Status: AC | PRN
  Administered 2021-08-26: 2 mL via INTRAVENOUS
  Administered 2021-08-26: 1 mL via INTRAVENOUS

## 2021-08-27 ENCOUNTER — Other Ambulatory Visit: Payer: Self-pay | Admitting: *Deleted

## 2021-08-27 ENCOUNTER — Encounter: Payer: Self-pay | Admitting: Physician Assistant

## 2021-08-27 DIAGNOSIS — I77810 Thoracic aortic ectasia: Secondary | ICD-10-CM

## 2021-08-30 ENCOUNTER — Encounter: Payer: Self-pay | Admitting: Cardiovascular Disease

## 2021-08-30 ENCOUNTER — Ambulatory Visit: Payer: Medicare HMO | Admitting: Cardiovascular Disease

## 2021-08-30 ENCOUNTER — Other Ambulatory Visit: Payer: Self-pay

## 2021-08-30 VITALS — BP 126/78 | HR 61 | Ht 72.0 in | Wt 252.4 lb

## 2021-08-30 DIAGNOSIS — I77810 Thoracic aortic ectasia: Secondary | ICD-10-CM | POA: Diagnosis not present

## 2021-08-30 DIAGNOSIS — I25119 Atherosclerotic heart disease of native coronary artery with unspecified angina pectoris: Secondary | ICD-10-CM

## 2021-08-30 DIAGNOSIS — E782 Mixed hyperlipidemia: Secondary | ICD-10-CM

## 2021-08-30 DIAGNOSIS — I10 Essential (primary) hypertension: Secondary | ICD-10-CM | POA: Diagnosis not present

## 2021-08-30 NOTE — Patient Instructions (Signed)
Medication Instructions:  Your physician recommends that you continue on your current medications as directed. Please refer to the Current Medication list given to you today.  *If you need a refill on your cardiac medications before your next appointment, please call your pharmacy*   Lab Work: None If you have labs (blood work) drawn today and your tests are completely normal, you will receive your results only by: [place] (if you have MyChart) OR A paper copy in the mail If you have any lab test that is abnormal or we need to change your treatment, we will call you to review the results.   Testing/Procedures: None   Follow-Up: At [facility], you and your health needs are our priority.  As part of our continuing mission to provide you with exceptional heart care, we have created designated Provider Care Teams.  These Care Teams include your primary Cardiologist (physician) and Advanced Practice Providers (APPs -  Physician Assistants and Nurse Practitioners) who all work together to provide you with the care you need, when you need it.  We recommend signing up for the patient portal called "MyChart".  Sign up information is provided on this After Visit Summary.  MyChart is used to connect with patients for Virtual Visits (Telemedicine).  Patients are able to view lab/test results, encounter notes, upcoming appointments, etc.  Non-urgent messages can be sent to your provider as well.   To learn more about what you can do with MyChart, go to [email or web address].    Your next appointment:   6 month(s)  The format for your next appointment:   In Person  Provider:   Robbie Lis, PA-C, Christen Bame, NP, or Richardson Dopp, PA-C     Then, Sherren Mocha, MD will plan to see you again in 1 year(s).    Other Instructions

## 2021-08-30 NOTE — Progress Notes (Signed)
Cardiology Office Note:    Date:  08/30/2021   ID:  Robert [name], DOB 1945-12-20, MRN [id number]  PCP:  Biagio Borg, MD   [facility] Providers Cardiologist:  Sherren Mocha, MD     Referring MD: Biagio Borg, MD   Chief Complaint  Patient presents with   Coronary Artery Disease    History of Present Illness:    Robert [name] is a 75 y.o. 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  He has done very well since his last PCI procedure 1 year ago.  Has had no recurrence of chest pain or pressure.  He denies dyspnea, orthopnea, PND, leg edema, or heart palpitations.  The patient is compliant with his medications.  His LDL cholesterol was above goal and he was started on ezetimibe several months ago with improvement in his lipids.  He continues on simvastatin 40 mg daily as well.  His last LDL cholesterol was 69 mg/dL.  He is exercising at the gym 3 days/week without exertional symptoms.  He had a recent echocardiogram that showed mild dilatation of the ascending aorta and he is scheduled for a CT angiogram in the near future.  Past Medical History:  Diagnosis Date   ALLERGIC RHINITIS 08/20/2007   Anemia    Arthritis    "back; knees; shoulders"  (06/08/2017)   Asthma    "very slight"    Barrett esophagus    BENIGN PROSTATIC HYPERTROPHY 08/20/2007   Coronary artery disease involving native heart with angina pectoris ([place])    a. s/p CABG x 3 in 1989;  b. MI in 1995 with BMS to VG->RCA;  c. 07/2000 PCI/BMS to native LCX;  d. Known CTO of VG->Diag - multiple caths since then. e. PCI/DES to LCx  08/2020.   ELBOW PAIN, LEFT 04/15/2008   ERECTILE DYSFUNCTION 08/20/2007   Gallbladder polyp 08/27/2015   Noted on [place] Abd   GERD (gastroesophageal reflux disease)    Hepatic cyst 09/06/2015   Large simple right hepatic lobe, noted on [place] Abd   History of cardiomegaly 05/15/2013   Noted on CXR   History of meningitis    2nd grade, 8th grade, 9th grade, age 15   Hyperlipidemia LDL goal <70    HYPERTENSION    "been on RX since CABG but never a problem" (06/08/2017)   INSOMNIA-SLEEP DISORDER-UNSPEC 03/17/2008   Microcytosis    Mild dilation of ascending aorta ([place])    Echo 12/22: EF 55-60, no RWMA, GR 1 DD, normal RVSF, severe LAE, mild RAE, mild MR aortic root 41, ascending aorta 42   Myocardial infarction ([place]) 02/1994   OSA on CPAP    PREMATURE VENTRICULAR CONTRACTIONS 08/20/2007   SCROTAL MASS 10/09/2008   URINARY INCONTINENCE, MALE 10/09/2008   UTI 04/24/2009   "just this once" (05/15/2013)    Past Surgical History:  Procedure Laterality Date   ANKLE FRACTURE SURGERY Right [place]  X 2   CARDIAC CATHETERIZATION N/A 04/01/2016   Procedure: Left Heart Cath and Cors/Grafts Angiography;  Surgeon: Leonie Man,  MD;  Location: [facility];  Service: Cardiovascular;  Laterality: N/A;   CARDIAC CATHETERIZATION N/A 04/01/2016   Procedure: Intravascular Pressure Wire/FFR Study;  Surgeon: Leonie Man, MD;  Location: [facility];  Service: Cardiovascular;  Laterality: N/A;   CARDIAC CATHETERIZATION N/A 04/01/2016   Procedure: Coronary Stent Intervention;  Surgeon: Leonie Man, MD;  Location: [facility];  Service: Cardiovascular;  Laterality: N/A;   CARDIAC CATHETERIZATION  06/08/2017   COLONOSCOPY  08/31/2011   COLONOSCOPY W/ BIOPSIES AND POLYPECTOMY  11/20/2003   adenomatous polyp, internal and external hemorrhoids   CORONARY ANGIOPLASTY  95, 98, 2000, [place]- 04/01/2016    "total of 4 stents counting today" (04/01/2016)   CORONARY ARTERY BYPASS GRAFT  1989   triple bypass MUSC   CYSTOSCOPY  08/07/2009   and right hydrocelectomy   FOOT TENDON SURGERY Left 2012   "donor tendon"    HEMI-MICRODISCECTOMY LUMBAR LAMINECTOMY LEVEL 1 Right 09/18/2013   Procedure: CENTRAL DECOMPRESSION @ L4-5 FOR SPINAL STENOSIS. EXCISION OF SYNOVIAL CYST @ L4-5 RIGHT, MICRODISCECTOMY @L4 -5 RIGHT ;  Surgeon: Tobi Bastos, MD;  Location: [facility];  Service: Orthopedics;  Laterality: Right;   INGUINAL HERNIA REPAIR Bilateral 1970's   "2 on one side; 1 on the other"    KNEE ARTHROSCOPY WITH MEDIAL MENISECTOMY Left 09/18/2018   Procedure: DIAGNOSTIC KNEE ARTHROSCOPY WITH MEDIAL MENISECTOMY;  Surgeon: Latanya Maudlin, MD;  Location: [facility];  Service: Orthopedics;  Laterality: Left;  41min   LEFT HEART CATH AND CORS/GRAFTS ANGIOGRAPHY N/A 06/08/2017   Procedure: LEFT HEART CATH AND CORS/GRAFTS ANGIOGRAPHY;  Surgeon: Lorretta Harp, MD;  Location: [facility];  Service: Cardiovascular;  Laterality: N/A;   LEFT HEART CATH AND CORS/GRAFTS ANGIOGRAPHY N/A 08/28/2020   Procedure: LEFT HEART CATH AND CORS/GRAFTS ANGIOGRAPHY;  Surgeon: Sherren Mocha, MD;  Location: [facility];  Service: Cardiovascular;  Laterality: N/A;   LEFT HEART CATHETERIZATION WITH CORONARY ANGIOGRAM Bilateral 05/15/2013   Procedure: LEFT HEART CATHETERIZATION WITH CORONARY ANGIOGRAM;  Surgeon: Wellington Hampshire, MD;  Location: [facility];  Service: Cardiovascular;  Laterality: Bilateral;   SHOULDER OPEN ROTATOR CUFF REPAIR Right 2009   [place]   SURGERY SCROTAL / TESTICULAR Right 2012   "removed the sac" (05/15/2013)   TONSILLECTOMY AND ADENOIDECTOMY  ~1961   TOTAL KNEE ARTHROPLASTY Left 07/01/2019   Procedure: LEFT TOTAL KNEE ARTHROPLASTY;  Surgeon: Gaynelle Arabian, MD;  Location: [facility];  Service: Orthopedics;  Laterality: Left;  63min   TRANSURETHRAL RESECTION OF PROSTATE  10/02/2009   [place]  transurethral resection    UPPER GI ENDOSCOPY  09/06/2011    Current Medications: Current Meds  Medication Sig   albuterol (VENTOLIN HFA) 108 (90 Base) MCG/ACT inhaler Inhale 1-2 puffs into the lungs every 6 (six) hours as needed for wheezing or shortness of breath.   amLODipine (NORVASC) 5 MG tablet Take 1 tablet (5 mg total) by mouth daily.   aspirin EC 81 MG EC tablet Take 1 tablet (81 mg total) by mouth daily. Swallow whole.   atenolol (TENORMIN) 25 MG tablet Take 1 tablet (25 mg total) by mouth at bedtime.   cholecalciferol (VITAMIN D3) 25 MCG (1000 UNIT) tablet Take 1,000 Units by mouth daily.   clopidogrel (PLAVIX) 75 MG tablet Take 1 tablet (75 mg total) by mouth daily.   famotidine (PEPCID) 20 MG tablet TAKE 1 TABLET TWICE DAILY   isosorbide mononitrate (IMDUR) 30  MG 24 hr tablet Take 1 tablet (30 mg total) by mouth 2 (two) times daily.   lisinopril (ZESTRIL) 10 MG tablet Take 1 tablet (10 mg total) by mouth daily.   nitroGLYCERIN (NITROSTAT) 0.4 MG SL tablet DISSOLVE ONE TABLET UNDER THE TONGUE EVERY 5 MINUTES AS NEEDED FOR CHEST PAIN.  DO NOT EXCEED A TOTAL OF 3 DOSES IN 15 MINUTES   omeprazole (PRILOSEC) 20 MG capsule 20 mg daily.   sennosides-docusate sodium (SENOKOT-S) 8.6-50 MG tablet Take 1 tablet by mouth at bedtime.   simvastatin (ZOCOR) 40 MG tablet TAKE 1 TABLET (40 MG TOTAL) BY MOUTH AT BEDTIME.   sulindac (CLINORIL) 200 MG tablet Take 200 mg by mouth 2 (two) times daily.   temazepam (RESTORIL) 30 MG capsule TAKE 1 CAPSULE BY MOUTH AT BEDTIME AS NEEDED FOR SLEEP   vitamin B-12 (CYANOCOBALAMIN) 100 MCG tablet Take 100 mcg by mouth daily.     Allergies:   Metoprolol, Lipitor [atorvastatin], and Adhesive [tape]   Social History   Socioeconomic History   Marital status: Married    Spouse name: Not on file   Number of children: 1   Years of education: Not on file   Highest education level: Not on file  Occupational History   Occupation: retired Lobbyist office  [employer]    Comment: part time golf course  Tobacco Use   Smoking status: Former    Packs/day: 1.00    Years: 45.00    Pack years: 45.00    Types: Cigarettes    Quit date: 04/23/1994    Years since quitting: 27.3   Smokeless tobacco: Never  Vaping Use   Vaping Use: Never used  Substance and Sexual Activity   Alcohol use: Not Currently    Comment:  "I've  quit drinking in 1989"   Drug use: No   Sexual activity: Yes  Other Topics Concern   Not on file  Social History Narrative   Not on file   Social Determinants of Health   Financial Resource Strain: Not on file  Food Insecurity: Not on file  Transportation Needs: Not on file  Physical Activity: Not on file  Stress: Not on file  Social Connections: Not on file     Family History: The patient's family history includes Coronary artery disease in his father and mother; Esophageal cancer in his brother; Heart disease in his brother.  ROS:   Please see the history of present illness.    All other systems reviewed and are negative.  EKGs/Labs/Other Studies Reviewed:    The following studies were reviewed today: Echo 08/26/2021:  1. Left ventricular ejection fraction, by estimation, is 55 to 60%. The  left ventricle has normal function. The left ventricle has no regional  wall motion abnormalities. Left ventricular diastolic parameters are  consistent with Grade I diastolic  dysfunction (impaired relaxation).   2. Right ventricular systolic function is normal. The right ventricular  size is normal.   3. Left atrial size was severely dilated.   4. Right atrial size was mildly dilated.   5. The mitral valve is normal in structure. Mild mitral valve  regurgitation. No evidence of mitral stenosis.   6. The aortic valve is tricuspid. Aortic valve regurgitation is not  visualized. No aortic stenosis is present.   7. There is mild dilatation of the aortic root, measuring 41 mm. There is  mild dilatation of the ascending aorta,  measuring 42 mm.   8. The inferior vena cava is normal  in size with greater than 50%  respiratory variability, suggesting right atrial pressure of 3 mmHg.   Comparison(s): No significant change from prior study. Prior images  reviewed side by side.   Cardiac Cath 08/28/21: 1.  Severe native three-vessel coronary artery disease with total occlusion of the proximal LAD, total occlusion of the mid RCA, and severe stenosis of the proximal circumflex 2.  Status post aortocoronary bypass surgery with continued patency of the LIMA to LAD graft and saphenous vein graft to PDA.  The PDA graft has moderate restenosis in the proximal body within the previously implanted stent.  This is unchanged over multiple cath studies.  There is also some mild disease in the distal vessel beyond the graft insertion site, also unchanged.  This is appropriate for medical therapy. 3.  Successful PCI of the left circumflex with a 3.0 x 26 mm resolute Onyx DES for treatment of progressive 90% stenosis, reduced to 0% with TIMI-3 flow pre and post.   Recommendations: Continue dual antiplatelet therapy with aspirin and clopidogrel at least 12 months, favor long-term therapy if tolerated.  As long as no complications arise, patient can be discharged tomorrow morning.  EKG:  EKG is ordered today.  The ekg ordered today demonstrates normal sinus rhythm 61 bpm, occasional PVC, age-indeterminate inferior infarct with no significant change since his prior tracing.  Recent Labs: 12/08/2020: BUN 21; Creatinine, Ser 1.01; Hemoglobin 13.6; Platelets 236.0; Potassium 4.5; Sodium 138; TSH 1.81 05/05/2021: ALT 21  Recent Lipid Panel    Component Value Date/Time   CHOL 118 05/05/2021 0923   TRIG 71 05/05/2021 0923   HDL 34 (L) 05/05/2021 0923   CHOLHDL 3.5 05/05/2021 0923   CHOLHDL 4 12/08/2020 1331   VLDL 42.0 (H) 12/08/2020 1331   LDLCALC 69 05/05/2021 0923   LDLDIRECT 71.0 12/08/2020 1331     Risk Assessment/Calculations:            Physical Exam:    VS:  BP 126/78   Pulse 61   Ht 6' (1.829 m)   Wt 252 lb 6.4 oz (114.5 kg)   SpO2 95%   BMI 34.23 kg/m     Wt Readings from Last 3 Encounters:  08/30/21 252 lb 6.4 oz (114.5 kg)  01/19/21 249 lb 12.8 oz (113.3 kg)  12/07/20 246 lb 12.8 oz (111.9 kg)     GEN:  Well nourished, well developed in no acute distress HEENT: Normal NECK: No JVD; No carotid bruits LYMPHATICS: No lymphadenopathy CARDIAC: RRR, no murmurs, rubs, gallops RESPIRATORY:  Clear to auscultation without rales, wheezing or rhonchi  ABDOMEN: Soft, non-tender, non-distended MUSCULOSKELETAL:  No edema; No deformity  SKIN: Warm and dry NEUROLOGIC:  Alert and oriented x 3 PSYCHIATRIC:  Normal affect   ASSESSMENT:    1. Mild dilation of ascending aorta (H[facility])   2. Coronary artery disease involving native coronary artery of native heart with angina pectoris ([place])   3. Essential hypertension   4. Mixed hyperlipidemia    PLAN:    In order of problems listed above:  Discussed the finding with patient.  His ascending aorta measured 41 mm by echo assessment.  He will have a CT angiogram to better evaluate in the near future. No angina on a combination of atenolol, amlodipine, and isosorbide.  He continues on clopidogrel and aspirin for antiplatelet therapy.  Lipid-lowering as detailed below. Blood pressure is well controlled on multidrug therapy.  Most recent labs reviewed when he had a creatinine of 1.0 and potassium  of 4.5. Lipids are at goal on a combination of simvastatin and Zetia.  Cholesterol is 118, HDL 34, LDL 69, triglycerides 71.     Medication Adjustments/Labs and Tests Ordered: Current medicines are reviewed at length with the patient today.  Concerns regarding medicines are outlined above.  Orders Placed This Encounter  Procedures   EKG 12-Lead    No orders of the defined types were placed in this encounter.   Patient Instructions  Medication Instructions:  Your  physician recommends that you continue on your current medications as directed. Please refer to the Current Medication list given to you today.  *If you need a refill on your cardiac medications before your next appointment, please call your pharmacy*   Lab Work: None If you have labs (blood work) drawn today and your tests are completely normal, you will receive your results only by: [place] (if you have MyChart) OR A paper copy in the mail If you have any lab test that is abnormal or we need to change your treatment, we will call you to review the results.   Testing/Procedures: None   Follow-Up: At [facility], you and your health needs are our priority.  As part of our continuing mission to provide you with exceptional heart care, we have created designated Provider Care Teams.  These Care Teams include your primary Cardiologist (physician) and Advanced Practice Providers (APPs -  Physician Assistants and Nurse Practitioners) who all work together to provide you with the care you need, when you need it.  We recommend signing up for the patient portal called "MyChart".  Sign up information is provided on this After Visit Summary.  MyChart is used to connect with patients for Virtual Visits (Telemedicine).  Patients are able to view lab/test results, encounter notes, upcoming appointments, etc.  Non-urgent messages can be sent to your provider as well.   To learn more about what you can do with MyChart, go to [email or web address].    Your next appointment:   6 month(s)  The format for your next appointment:   In Person  Provider:   Robbie Lis, PA-C, Christen Bame, NP, or Richardson Dopp, PA-C     Then, Sherren Mocha, MD will plan to see you again in 1 year(s).    Other Instructions     Signed, Sherren Mocha, MD  08/30/2021 1:36 PM    [facility]

## 2021-08-31 ENCOUNTER — Other Ambulatory Visit (HOSPITAL_COMMUNITY): Payer: Medicare HMO

## 2021-09-07 ENCOUNTER — Other Ambulatory Visit: Payer: Medicare HMO

## 2021-09-07 ENCOUNTER — Other Ambulatory Visit: Payer: Self-pay

## 2021-09-07 DIAGNOSIS — I77810 Thoracic aortic ectasia: Secondary | ICD-10-CM

## 2021-09-08 LAB — BASIC METABOLIC PANEL
BUN/Creatinine Ratio: 29 — ABNORMAL HIGH (ref 10–24)
BUN: 26 mg/dL (ref 8–27)
CO2: 22 mmol/L (ref 20–29)
Calcium: 8.7 mg/dL (ref 8.6–10.2)
Chloride: 104 mmol/L (ref 96–106)
Creatinine, Ser: 0.9 mg/dL (ref 0.76–1.27)
Glucose: 108 mg/dL — ABNORMAL HIGH (ref 70–99)
Potassium: 4.9 mmol/L (ref 3.5–5.2)
Sodium: 140 mmol/L (ref 134–144)
eGFR: 89 mL/min/{1.73_m2} (ref >59)

## 2021-09-16 ENCOUNTER — Ambulatory Visit (INDEPENDENT_AMBULATORY_CARE_PROVIDER_SITE_OTHER)
Admission: RE | Admit: 2021-09-16 | Discharge: 2021-09-16 | Disposition: A | Discharge status: Home / Self Care | Payer: Medicare HMO | Source: Ambulatory Visit | Attending: Physician Assistant | Admitting: Physician Assistant

## 2021-09-16 ENCOUNTER — Other Ambulatory Visit: Payer: Self-pay

## 2021-09-16 ENCOUNTER — Encounter: Payer: Self-pay | Admitting: Physician Assistant

## 2021-09-16 DIAGNOSIS — I77810 Thoracic aortic ectasia: Secondary | ICD-10-CM | POA: Insufficient documentation

## 2021-09-16 DIAGNOSIS — K449 Diaphragmatic hernia without obstruction or gangrene: Secondary | ICD-10-CM | POA: Diagnosis not present

## 2021-09-16 DIAGNOSIS — I700 Atherosclerosis of aorta: Secondary | ICD-10-CM | POA: Insufficient documentation

## 2021-09-16 HISTORY — DX: Atherosclerosis of aorta: I70.0

## 2021-09-16 MED ORDER — IOHEXOL 350 MG/ML SOLN
100.0000 mL | Freq: Once | INTRAVENOUS | Status: AC | PRN
  Administered 2021-09-16: 11:00:00 100 mL via INTRAVENOUS

## 2021-09-22 ENCOUNTER — Telehealth: Payer: Self-pay | Admitting: Physician Assistant

## 2021-09-22 NOTE — Telephone Encounter (Signed)
Liliane Shi, PA-C  09/16/2021  3:18 PM EST     CT shows stable mild dilation of ascending aorta at 41 mm. PLAN:  - Continue current medications/treatment plan and follow up as scheduled.  - Repeat CT in 1 year - Send copy to PCP Richardson Dopp, PA-C    09/16/2021 3:15 PM        Results given to patient,he is quite relieved. I will forward results to pcp.

## 2021-09-22 NOTE — Telephone Encounter (Signed)
Patient calling for results to his CT scan.

## 2021-09-24 ENCOUNTER — Other Ambulatory Visit: Payer: Self-pay | Admitting: *Deleted

## 2021-09-24 DIAGNOSIS — I77810 Thoracic aortic ectasia: Secondary | ICD-10-CM

## 2021-10-22 ENCOUNTER — Inpatient Hospital Stay: Admission: RE | Admit: 2021-10-22 | Payer: Medicare HMO | Source: Ambulatory Visit

## 2021-11-10 ENCOUNTER — Other Ambulatory Visit: Payer: Self-pay | Admitting: Internal Medicine

## 2021-11-10 NOTE — Telephone Encounter (Signed)
Ok to let pt know  We were unable to refill 4 of his meds today to the mail in pharmacy due to our office refill policy   - office routine med refill policy (all routine meds to be refilled for 3 mo or monthly (per pt preference) up to one year from last visit, then month to month grace period for 3 mo  So we can send one mo to a local pharmacy if he likes, just let us know

## 2021-11-11 NOTE — Telephone Encounter (Signed)
Called patient to inform him of his medication refill request . Patient has an appt on 11/19/2021. Patient states he will wait after his appt for the refill so it can be sent to [name] mail order

## 2021-11-19 ENCOUNTER — Other Ambulatory Visit: Payer: Self-pay

## 2021-11-19 ENCOUNTER — Encounter: Payer: Self-pay | Admitting: Internal Medicine

## 2021-11-19 ENCOUNTER — Ambulatory Visit (INDEPENDENT_AMBULATORY_CARE_PROVIDER_SITE_OTHER): Payer: Medicare HMO | Admitting: Internal Medicine

## 2021-11-19 VITALS — BP 116/72 | HR 64 | Temp 99.5°F | Ht 72.0 in | Wt 253.0 lb

## 2021-11-19 DIAGNOSIS — I10 Essential (primary) hypertension: Secondary | ICD-10-CM

## 2021-11-19 DIAGNOSIS — R509 Fever, unspecified: Secondary | ICD-10-CM

## 2021-11-19 DIAGNOSIS — R7302 Impaired glucose tolerance (oral): Secondary | ICD-10-CM | POA: Diagnosis not present

## 2021-11-19 DIAGNOSIS — Z0001 Encounter for general adult medical examination with abnormal findings: Secondary | ICD-10-CM | POA: Insufficient documentation

## 2021-11-19 DIAGNOSIS — E781 Pure hyperglyceridemia: Secondary | ICD-10-CM | POA: Diagnosis not present

## 2021-11-19 MED ORDER — AMLODIPINE BESYLATE 5 MG PO TABS: 5.0000 mg | ORAL_TABLET | Freq: Every day | ORAL | 3 refills | Status: DC

## 2021-11-19 MED ORDER — ATENOLOL 25 MG PO TABS: 25.0000 mg | ORAL_TABLET | Freq: Every day | ORAL | 3 refills | Status: DC

## 2021-11-19 MED ORDER — SIMVASTATIN 40 MG PO TABS: 40.0000 mg | ORAL_TABLET | Freq: Every day | ORAL | 3 refills | Status: DC

## 2021-11-19 MED ORDER — ISOSORBIDE MONONITRATE ER 30 MG PO TB24: 30.0000 mg | ORAL_TABLET | Freq: Two times a day (BID) | ORAL | 3 refills | Status: DC

## 2021-11-19 MED ORDER — LISINOPRIL 10 MG PO TABS: 10.0000 mg | ORAL_TABLET | Freq: Every day | ORAL | 3 refills | Status: DC

## 2021-11-19 MED ORDER — CLOPIDOGREL BISULFATE 75 MG PO TABS: 75.0000 mg | ORAL_TABLET | Freq: Every day | ORAL | 3 refills | Status: DC

## 2021-11-19 MED ORDER — TEMAZEPAM 30 MG PO CAPS: ORAL_CAPSULE | ORAL | 1 refills | Status: DC

## 2021-11-19 MED ORDER — OMEPRAZOLE 20 MG PO CPDR: 20.0000 mg | DELAYED_RELEASE_CAPSULE | Freq: Every day | ORAL | 3 refills | Status: DC

## 2021-11-19 NOTE — Progress Notes (Addendum)
Patient ID: Robert [name], male   DOB: [birth year], 76 y.o.   MRN: [id number]         Chief Complaint:: wellness exam and GERD, low grade temp, htn, hyperglycemia       HPI:  Robert [name] is a 76 y.o. male here for wellness exam; going to the gym 3 times per wk with silver sneakers. Declnies covid booster, colonoscopy, o/w up to date                        Also Denies worsening reflux, abd pain, dysphagia, n/v, bowel change or blood.  Pepcid never helped, so stopped this.  Had recent CPX labs done at [place].  Pt denies chest pain, increased sob or doe, wheezing, orthopnea, PND, increased LE swelling, palpitations, dizziness or syncope.   Pt denies polydipsia, polyuria, or new focal neuro s/s.    Pt denies fever, wt loss, night sweats, loss of appetite, or other constitutional symptoms  Denies urinary symptoms such as dysuria, frequency, urgency, flank pain, hematuria or n/v, fever, chills.  No cough or CP.   Wt Readings from Last 3 Encounters:  11/19/21 253 lb (114.8 kg)  08/30/21 252 lb 6.4 oz (114.5 kg)  01/19/21 249 lb 12.8 oz (113.3 kg)   BP Readings from Last 3 Encounters:  11/19/21 116/72  08/30/21 126/78  01/19/21 120/70   Immunization History  Administered Date(s) Administered   Fluad Quad(high Dose 65+) 08/13/2019, 06/19/2020   H1N1 10/09/2008   Influenza Whole 08/20/2007, 07/21/2008   Influenza-Unspecified 07/20/2013, 08/04/2015, 10/04/2021   PFIZER(Purple Top)SARS-COV-2 Vaccination 11/10/2019, 12/02/2019, 06/29/2020   Pneumococcal Conjugate-13 06/24/2014   Pneumococcal Polysaccharide-23 06/24/2013   Pneumococcal-Unspecified 05/20/2010   Td 10/09/2008   There are no preventive care reminders to display for this patient.     Past Medical History:  Diagnosis Date   ALLERGIC RHINITIS 08/20/2007   Anemia    Aortic atherosclerosis ([place])    CT in 08/2021   Arthritis    "back; knees; shoulders"  (06/08/2017)   Asthma    "very slight"    Barrett esophagus    BENIGN  PROSTATIC HYPERTROPHY 08/20/2007   Coronary artery disease involving native heart with angina pectoris ([place])    a. s/p CABG x 3 in 1989;  b. MI in 1995 with BMS to VG->RCA;  c. 07/2000 PCI/BMS to native LCX;  d. Known CTO of VG->Diag - multiple caths since then. e. PCI/DES to LCx 08/2020.   ELBOW PAIN, LEFT 04/15/2008   ERECTILE DYSFUNCTION 08/20/2007   Gallbladder polyp 08/27/2015   Noted on [place] Abd   GERD (gastroesophageal reflux disease)    Hepatic cyst 09/06/2015   Large simple right hepatic lobe, noted on [place] Abd   History of cardiomegaly 05/15/2013   Noted on CXR   History of meningitis    2nd grade, 8th grade, 9th grade, age 53   Hyperlipidemia LDL goal <70    HYPERTENSION    "been on RX since CABG but never a problem" (06/08/2017)   INSOMNIA-SLEEP DISORDER-UNSPEC 03/17/2008   Microcytosis    Mild dilation of ascending aorta ([place])    Echo 12/22: EF 55-60, no RWMA, GR 1 DD, normal RVSF, severe LAE, mild RAE, mild MR aortic root 41, ascending aorta 42 // Chest/Aorta CTA 12/22: Ascending Aorta 41 mm; aortic atherosclerosis   Myocardial infarction ([place]) 02/1994   OSA on CPAP    PREMATURE VENTRICULAR CONTRACTIONS 08/20/2007   SCROTAL MASS 10/09/2008   URINARY  INCONTINENCE, MALE 10/09/2008   UTI 04/24/2009   "just this once" (05/15/2013)   Past Surgical History:  Procedure Laterality Date   ANKLE FRACTURE SURGERY Right [place]  X 2   CARDIAC CATHETERIZATION N/A 04/01/2016   Procedure: Left Heart Cath and Cors/Grafts Angiography;  Surgeon: Leonie Man, MD;  Location: [facility];  Service: Cardiovascular;  Laterality: N/A;   CARDIAC CATHETERIZATION N/A 04/01/2016   Procedure: Intravascular Pressure Wire/FFR Study;  Surgeon: Leonie Man, MD;  Location: [facility];  Service: Cardiovascular;  Laterality: N/A;   CARDIAC CATHETERIZATION N/A 04/01/2016   Procedure: Coronary Stent Intervention;  Surgeon: Leonie Man, MD;   Location: [facility];  Service: Cardiovascular;  Laterality: N/A;   CARDIAC CATHETERIZATION  06/08/2017   COLONOSCOPY  08/31/2011   COLONOSCOPY W/ BIOPSIES AND POLYPECTOMY  11/20/2003   adenomatous polyp, internal and external hemorrhoids   CORONARY ANGIOPLASTY  95, 98, 2000, [place]- 04/01/2016   "total of 4 stents counting today" (04/01/2016)   CORONARY ARTERY BYPASS GRAFT  1989   triple bypass MUSC   CYSTOSCOPY  08/07/2009   and right hydrocelectomy   FOOT TENDON SURGERY Left 2012   "donor tendon"    HEMI-MICRODISCECTOMY LUMBAR LAMINECTOMY LEVEL 1 Right 09/18/2013   Procedure: CENTRAL DECOMPRESSION @ L4-5 FOR SPINAL STENOSIS. EXCISION OF SYNOVIAL CYST @ L4-5 RIGHT, MICRODISCECTOMY @L4 -5 RIGHT ;  Surgeon: Tobi Bastos, MD;  Location: [facility];  Service: Orthopedics;  Laterality: Right;   INGUINAL HERNIA REPAIR Bilateral 1970's   "2 on one side; 1 on the other"    KNEE ARTHROSCOPY WITH MEDIAL MENISECTOMY Left 09/18/2018   Procedure: DIAGNOSTIC KNEE ARTHROSCOPY WITH MEDIAL MENISECTOMY;  Surgeon: Latanya Maudlin, MD;  Location: [facility];  Service: Orthopedics;  Laterality: Left;  39min   LEFT HEART CATH AND CORS/GRAFTS ANGIOGRAPHY N/A 06/08/2017   Procedure: LEFT HEART CATH AND CORS/GRAFTS ANGIOGRAPHY;  Surgeon: Lorretta Harp, MD;  Location: [facility];  Service: Cardiovascular;  Laterality: N/A;   LEFT HEART CATH AND CORS/GRAFTS ANGIOGRAPHY N/A 08/28/2020   Procedure: LEFT HEART CATH AND CORS/GRAFTS ANGIOGRAPHY;  Surgeon: Sherren Mocha, MD;  Location: [facility];  Service: Cardiovascular;  Laterality: N/A;   LEFT HEART CATHETERIZATION WITH CORONARY ANGIOGRAM Bilateral 05/15/2013   Procedure: LEFT HEART CATHETERIZATION WITH CORONARY ANGIOGRAM;  Surgeon: Wellington Hampshire, MD;  Location: [facility];  Service: Cardiovascular;  Laterality: Bilateral;   SHOULDER OPEN ROTATOR CUFF REPAIR Right 2009   [facility]   SURGERY  SCROTAL / TESTICULAR Right 2012   "removed the sac" (05/15/2013)   TONSILLECTOMY AND ADENOIDECTOMY  ~1961   TOTAL KNEE ARTHROPLASTY Left 07/01/2019   Procedure: LEFT TOTAL KNEE ARTHROPLASTY;  Surgeon: Gaynelle Arabian, MD;  Location: [facility];  Service: Orthopedics;  Laterality: Left;  45min   TRANSURETHRAL RESECTION OF PROSTATE  10/02/2009   [place] transurethral resection    UPPER GI ENDOSCOPY  09/06/2011    reports that he quit smoking about 27 years ago. His smoking use included cigarettes. He has a 45.00 pack-year smoking history. He has never used smokeless tobacco. He reports that he does not currently use alcohol. He reports that he does not use drugs. family history includes Coronary artery disease in his father and mother; Esophageal cancer in his brother; Heart disease in his brother. Allergies  Allergen Reactions   Metoprolol Other (See Comments)  severe bradycardia   Lipitor [Atorvastatin] Other (See Comments)    Worsening reflux   Adhesive [Tape] Rash   Current Outpatient Medications on File Prior to Visit  Medication Sig Dispense Refill   albuterol (VENTOLIN HFA) 108 (90 Base) MCG/ACT inhaler Inhale 1-2 puffs into the lungs every 6 (six) hours as needed for wheezing or shortness of breath.     aspirin EC 81 MG EC tablet Take 1 tablet (81 mg total) by mouth daily. Swallow whole. 30 tablet 11   cholecalciferol (VITAMIN D3) 25 MCG (1000 UNIT) tablet Take 1,000 Units by mouth daily.     nitroGLYCERIN (NITROSTAT) 0.4 MG SL tablet DISSOLVE ONE TABLET UNDER THE TONGUE EVERY 5 MINUTES AS NEEDED FOR CHEST PAIN.  DO NOT EXCEED A TOTAL OF 3 DOSES IN 15 MINUTES 25 tablet 0   sennosides-docusate sodium (SENOKOT-S) 8.6-50 MG tablet Take 1 tablet by mouth at bedtime.     sulindac (CLINORIL) 200 MG tablet Take 200 mg by mouth 2 (two) times daily.     vitamin B-12 (CYANOCOBALAMIN) 500 MCG tablet TAKE ONE TABLET BY MOUTH DAILY FOR B12     No current facility-administered medications on file prior  to visit.        ROS:  All others reviewed and negative.  Objective        PE:  BP 116/72 (BP Location: Left Arm, Patient Position: Sitting, Cuff Size: Large)    Pulse 64    Temp 99.5 F (37.5 C) (Oral)    Ht 6' (1.829 m)    Wt 253 lb (114.8 kg)    SpO2 92%    BMI 34.31 kg/m                 Constitutional: Pt appears in NAD               HENT: Head: NCAT.                Right Ear: External ear normal.                 Left Ear: External ear normal.                Eyes: . Pupils are equal, round, and reactive to light. Conjunctivae and EOM are normal               Nose: without d/c or deformity               Neck: Neck supple. Gross normal ROM               Cardiovascular: Normal rate and regular rhythm.                 Pulmonary/Chest: Effort normal and breath sounds without rales or wheezing.                Abd:  Soft, NT, ND, + BS, no organomegaly               Neurological: Pt is alert. At baseline orientation, motor grossly intact               Skin: Skin is warm. No rashes, no other new lesions, LE edema - none               Psychiatric: Pt behavior is normal without agitation   Micro: none  Cardiac tracings I have personally interpreted today:  none  Pertinent Radiological findings (summarize): none   Lab Results  Component  Value Date   WBC 6.8 12/08/2020   HGB 13.6 12/08/2020   HCT 41.8 12/08/2020   PLT 236.0 12/08/2020   GLUCOSE 108 (H) 09/07/2021   CHOL 118 05/05/2021   TRIG 71 05/05/2021   HDL 34 (L) 05/05/2021   LDLDIRECT 71.0 12/08/2020   LDLCALC 69 05/05/2021   ALT 21 05/05/2021   AST 26 05/05/2021   NA 140 09/07/2021   K 4.9 09/07/2021   CL 104 09/07/2021   CREATININE 0.90 09/07/2021   BUN 26 09/07/2021   CO2 22 09/07/2021   TSH 1.81 12/08/2020   PSA 0.91 12/08/2020   INR 1.1 06/24/2019   HGBA1C 6.3 12/08/2020   Assessment/Plan:  UGO THOMA is a 76 y.o. White or Caucasian [1] male with  has a past medical history of ALLERGIC RHINITIS  (08/20/2007), Anemia, Aortic atherosclerosis ([place]), Arthritis, Asthma, Barrett esophagus, BENIGN PROSTATIC HYPERTROPHY (08/20/2007), Coronary artery disease involving native heart with angina pectoris ([place]), ELBOW PAIN, LEFT (04/15/2008), ERECTILE DYSFUNCTION (08/20/2007), Gallbladder polyp (08/27/2015), GERD (gastroesophageal reflux disease), Hepatic cyst (09/06/2015), History of cardiomegaly (05/15/2013), History of meningitis, Hyperlipidemia LDL goal <70, HYPERTENSION, INSOMNIA-SLEEP DISORDER-UNSPEC (03/17/2008), Microcytosis, Mild dilation of ascending aorta ([place]), Myocardial infarction ([place]) (02/1994), OSA on CPAP, PREMATURE VENTRICULAR CONTRACTIONS (08/20/2007), SCROTAL MASS (10/09/2008), URINARY INCONTINENCE, MALE (10/09/2008), and UTI (04/24/2009).  Encounter for well adult exam with abnormal findings Age and sex appropriate education and counseling updated with regular exercise and diet Referrals for preventative services - declines colonoscopy Immunizations addressed - none needed Smoking counseling  - none needed Evidence for depression or other mood disorder - none significant Most recent labs reviewed. I have personally reviewed and have noted: 1) the patient's medical and social history 2) The patient's current medications and supplements 3) The patient's height, weight, and BMI have been recorded in the chart   Low grade fever Low grade, asympt, exam benign, declines further lab or xray today,  to f/u any worsening symptoms or concerns  Impaired glucose tolerance Lab Results  Component Value Date   HGBA1C 6.3 12/08/2020   Stable, pt to continue current medical treatment  - diet   Essential hypertension BP Readings from Last 3 Encounters:  11/19/21 116/72  08/30/21 126/78  01/19/21 120/70   Stable, pt to continue medical treatment tenormin, lisinopril   Hyperlipidemia type IV Lab Results  Component Value Date   LDLCALC 69 05/05/2021   Stable, pt to continue  current statin zocor  Followup: Return in about 6 months (around 05/22/2022).  Cathlean Cower, MD 11/21/2021 2:05 PM [facility]

## 2021-11-19 NOTE — Patient Instructions (Signed)
Ok to stop the pepcid as you have done ? ?Please continue all other medications as before, and refills have been done if requested. ? ?Please have the pharmacy call with any other refills you may need. ? ?Please continue your efforts at being more active, low cholesterol diet, and weight control. ? ?You are otherwise up to date with prevention measures today. ? ?Please keep your appointments with your specialists as you may have planned ? ?We can hold on the Lab testing for now ? ?Please make an Appointment to return in 6 months, or sooner if needed ?

## 2021-11-21 ENCOUNTER — Encounter: Payer: Self-pay | Admitting: Internal Medicine

## 2021-11-21 NOTE — Assessment & Plan Note (Signed)
Lab Results  ?Component Value Date  ? HGBA1C 6.3 12/08/2020  ? ?Stable, pt to continue current medical treatment  - diet ? ?

## 2021-11-21 NOTE — Assessment & Plan Note (Signed)
Age and sex appropriate education and counseling updated with regular exercise and diet ?Referrals for preventative services - declines colonoscopy ?Immunizations addressed - none needed ?Smoking counseling  - none needed ?Evidence for depression or other mood disorder - none significant ?Most recent labs reviewed. ?I have personally reviewed and have noted: ?1) the patient's medical and social history ?2) The patient's current medications and supplements ?3) The patient's height, weight, and BMI have been recorded in the chart ? ?

## 2021-11-21 NOTE — Assessment & Plan Note (Signed)
Lab Results  ?Component Value Date  ? [place] 69 05/05/2021  ? ?Stable, pt to continue current statin zocor ? ?

## 2021-11-21 NOTE — Assessment & Plan Note (Signed)
Low grade, asympt, exam benign, declines further lab or xray today,  to f/u any worsening symptoms or concerns ?

## 2021-11-21 NOTE — Assessment & Plan Note (Signed)
BP Readings from Last 3 Encounters:  ?11/19/21 116/72  ?08/30/21 126/78  ?01/19/21 120/70  ? ?Stable, pt to continue medical treatment tenormin, lisinopril ? ?

## 2021-12-03 ENCOUNTER — Other Ambulatory Visit: Payer: Self-pay | Admitting: Internal Medicine

## 2021-12-03 DIAGNOSIS — I2510 Atherosclerotic heart disease of native coronary artery without angina pectoris: Secondary | ICD-10-CM

## 2021-12-16 NOTE — Telephone Encounter (Signed)
error 

## 2021-12-18 ENCOUNTER — Other Ambulatory Visit: Payer: Self-pay | Admitting: Internal Medicine

## 2021-12-18 NOTE — Telephone Encounter (Signed)
Please refill as per office routine med refill policy (all routine meds to be refilled for 3 mo or monthly (per pt preference) up to one year from last visit, then month to month grace period for 3 mo, then further med refills will have to be denied) ? ?

## 2022-01-12 DIAGNOSIS — H52203 Unspecified astigmatism, bilateral: Secondary | ICD-10-CM | POA: Diagnosis not present

## 2022-01-12 DIAGNOSIS — Z961 Presence of intraocular lens: Secondary | ICD-10-CM | POA: Diagnosis not present

## 2022-01-12 DIAGNOSIS — H02831 Dermatochalasis of right upper eyelid: Secondary | ICD-10-CM | POA: Diagnosis not present

## 2022-01-12 DIAGNOSIS — H43813 Vitreous degeneration, bilateral: Secondary | ICD-10-CM | POA: Diagnosis not present

## 2022-02-25 ENCOUNTER — Ambulatory Visit: Payer: Medicare HMO

## 2022-02-28 ENCOUNTER — Ambulatory Visit (INDEPENDENT_AMBULATORY_CARE_PROVIDER_SITE_OTHER): Payer: Medicare HMO | Admitting: Internal Medicine

## 2022-02-28 VITALS — BP 132/78 | HR 62 | Temp 97.6°F | Ht 72.0 in | Wt 245.0 lb

## 2022-02-28 DIAGNOSIS — I10 Essential (primary) hypertension: Secondary | ICD-10-CM | POA: Diagnosis not present

## 2022-02-28 DIAGNOSIS — R7302 Impaired glucose tolerance (oral): Secondary | ICD-10-CM | POA: Diagnosis not present

## 2022-02-28 DIAGNOSIS — E781 Pure hyperglyceridemia: Secondary | ICD-10-CM | POA: Diagnosis not present

## 2022-02-28 MED ORDER — RYBELSUS 3 MG PO TABS: 3.0000 mg | ORAL_TABLET | Freq: Every day | ORAL | 3 refills | Status: DC

## 2022-02-28 NOTE — Progress Notes (Signed)
Patient ID: Robert [name], male   DOB: 13-Feb-1946, 76 y.o.   MRN: [id number]        Chief Complaint: follow up HTN, HLD and hyperglycemia, obesity       HPI:  Robert [name] is a 76 y.o. male here overall doing ok, has hd some success in wt loss, but unable to lose further with diet and activity.  Pt denies chest pain, increased sob or doe, wheezing, orthopnea, PND, increased LE swelling, palpitations, dizziness or syncope.   Pt denies polydipsia, polyuria, or new focal neuro s/s.    Pt denies fever, wt loss, night sweats, loss of appetite, or other constitutional symptoms  Wt Readings from Last 3 Encounters:  02/28/22 245 lb (111.1 kg)  11/19/21 253 lb (114.8 kg)  08/30/21 252 lb 6.4 oz (114.5 kg)   BP Readings from Last 3 Encounters:  02/28/22 132/78  11/19/21 116/72  08/30/21 126/78         Past Medical History:  Diagnosis Date   ALLERGIC RHINITIS 08/20/2007   Anemia    Aortic atherosclerosis ([place])    CT in 08/2021   Arthritis    "back; knees; shoulders"  (06/08/2017)   Asthma    "very slight"    Barrett esophagus    BENIGN PROSTATIC HYPERTROPHY 08/20/2007   Coronary artery disease involving native heart with angina pectoris ([place])    a. s/p CABG x 3 in 1989;  b. MI in 1995 with BMS to VG->RCA;  c. 07/2000 PCI/BMS to native LCX;  d. Known CTO of VG->Diag - multiple caths since then. e. PCI/DES to LCx 08/2020.   ELBOW PAIN, LEFT 04/15/2008   ERECTILE DYSFUNCTION 08/20/2007   Gallbladder polyp 08/27/2015   Noted on [place] Abd   GERD (gastroesophageal reflux disease)    Hepatic cyst 09/06/2015   Large simple right hepatic lobe, noted on [place] Abd   History of cardiomegaly 05/15/2013   Noted on CXR   History of meningitis    2nd grade, 8th grade, 9th grade, age 36   Hyperlipidemia LDL goal <70    HYPERTENSION    "been on RX since CABG but never a problem" (06/08/2017)   INSOMNIA-SLEEP DISORDER-UNSPEC 03/17/2008   Microcytosis    Mild dilation of ascending aorta ([place])     Echo 12/22: EF 55-60, no RWMA, GR 1 DD, normal RVSF, severe LAE, mild RAE, mild MR aortic root 41, ascending aorta 42 // Chest/Aorta CTA 12/22: Ascending Aorta 41 mm; aortic atherosclerosis   Myocardial infarction ([place]) 02/1994   OSA on CPAP    PREMATURE VENTRICULAR CONTRACTIONS 08/20/2007   SCROTAL MASS 10/09/2008   URINARY INCONTINENCE, MALE 10/09/2008   UTI 04/24/2009   "just this once" (05/15/2013)   Past Surgical History:  Procedure Laterality Date   ANKLE FRACTURE SURGERY Right [place]  X 2   CARDIAC CATHETERIZATION N/A 04/01/2016   Procedure: Left Heart Cath and Cors/Grafts Angiography;  Surgeon: Leonie Man, MD;  Location: [facility];  Service: Cardiovascular;  Laterality: N/A;   CARDIAC CATHETERIZATION N/A 04/01/2016   Procedure: Intravascular Pressure Wire/FFR Study;  Surgeon: Leonie Man, MD;  Location: [facility];  Service: Cardiovascular;  Laterality: N/A;   CARDIAC CATHETERIZATION N/A 04/01/2016   Procedure: Coronary Stent Intervention;  Surgeon: Leonie Man, MD;  Location: [facility];  Service: Cardiovascular;  Laterality: N/A;   CARDIAC CATHETERIZATION  06/08/2017   COLONOSCOPY  08/31/2011  COLONOSCOPY W/ BIOPSIES AND POLYPECTOMY  11/20/2003   adenomatous polyp, internal and external hemorrhoids   CORONARY ANGIOPLASTY  95, 98, 2000, 2001   [place]- 04/01/2016   "total of 4 stents counting today" (04/01/2016)   CORONARY ARTERY BYPASS GRAFT  1989   triple bypass MUSC   CYSTOSCOPY  08/07/2009   and right hydrocelectomy   FOOT TENDON SURGERY Left 2012   "donor tendon"    HEMI-MICRODISCECTOMY LUMBAR LAMINECTOMY LEVEL 1 Right 09/18/2013   Procedure: CENTRAL DECOMPRESSION @ L4-5 FOR SPINAL STENOSIS. EXCISION OF SYNOVIAL CYST @ L4-5 RIGHT, MICRODISCECTOMY '@L4'$ -5 RIGHT ;  Surgeon: Tobi Bastos, MD;  [facility];  Service: Orthopedics;  Laterality: Right;    INGUINAL HERNIA REPAIR Bilateral 1970's   "2 on one side; 1 on the other"    KNEE ARTHROSCOPY WITH MEDIAL MENISECTOMY Left 09/18/2018   Procedure: DIAGNOSTIC KNEE ARTHROSCOPY WITH MEDIAL MENISECTOMY;  Surgeon: Latanya Maudlin, MD;  [facility];  Service: Orthopedics;  Laterality: Left;  44mn   LEFT HEART CATH AND CORS/GRAFTS ANGIOGRAPHY N/A 06/08/2017   Procedure: LEFT HEART CATH AND CORS/GRAFTS ANGIOGRAPHY;  Surgeon: BLorretta Harp MD;  [facility];  Service: Cardiovascular;  Laterality: N/A;   LEFT HEART CATH AND CORS/GRAFTS ANGIOGRAPHY N/A 08/28/2020   Procedure: LEFT HEART CATH AND CORS/GRAFTS ANGIOGRAPHY;  Surgeon: CSherren Mocha MD;  [facility];  Service: Cardiovascular;  Laterality: N/A;   LEFT HEART CATHETERIZATION WITH CORONARY ANGIOGRAM Bilateral 05/15/2013   Procedure: LEFT HEART CATHETERIZATION WITH CORONARY ANGIOGRAM;  Surgeon: MWellington Hampshire MD;  [facility];  Service: Cardiovascular;  Laterality: Bilateral;   SHOULDER OPEN ROTATOR CUFF REPAIR Right 2009   Gioffre   SURGERY SCROTAL / TESTICULAR Right 2012   "removed the sac" (05/15/2013)   TONSILLECTOMY AND ADENOIDECTOMY  ~1961   TOTAL KNEE ARTHROPLASTY Left 07/01/2019   Procedure: LEFT TOTAL KNEE ARTHROPLASTY;  Surgeon: AGaynelle Arabian MD;  [facility];  Service: Orthopedics;  Laterality: Left;  541m   TRANSURETHRAL RESECTION OF PROSTATE  10/02/2009   Gyrus transurethral resection    UPPER GI ENDOSCOPY  09/06/2011    reports that he quit smoking about 27 years ago. His smoking use included cigarettes. He has a 45.00 pack-year smoking history. He has never used smokeless tobacco. He reports that he does not currently use alcohol. He reports that he does not use drugs. family history includes Coronary artery disease in his father and mother; Esophageal cancer in his brother; Heart disease in his brother. Allergies  Allergen Reactions   Metoprolol Other (See Comments)     severe bradycardia   Lipitor [Atorvastatin] Other (See Comments)    Worsening reflux   Adhesive [Tape] Rash   Current Outpatient Medications on File Prior to Visit  Medication Sig Dispense Refill   albuterol (VENTOLIN HFA) 108 (90 Base) MCG/ACT inhaler Inhale 1-2 puffs into the lungs every 6 (six) hours as needed for wheezing or shortness of breath.     amLODipine (NORVASC) 5 MG tablet Take 1 tablet (5 mg total) by mouth daily. 90 tablet 3   aspirin EC 81 MG EC tablet Take 1 tablet (81 mg total) by mouth daily. Swallow whole. 30 tablet 11   atenolol (TENORMIN) 25 MG tablet TAKE 1 TABLET (25 MG TOTAL) BY MOUTH AT BEDTIME. 90 tablet 3   cholecalciferol (VITAMIN D3) 25 MCG (1000 UNIT) tablet Take 1,000 Units by mouth daily.     clopidogrel (  PLAVIX) 75 MG tablet Take 1 tablet (75 mg total) by mouth daily. 90 tablet 3   isosorbide mononitrate (IMDUR) 30 MG 24 hr tablet Take 1 tablet (30 mg total) by mouth 2 (two) times daily. 180 tablet 3   lisinopril (ZESTRIL) 10 MG tablet Take 1 tablet (10 mg total) by mouth daily. 90 tablet 3   nitroGLYCERIN (NITROSTAT) 0.4 MG SL tablet PLACE 1 TABLET (0.4 MG TOTAL) UNDER THE TONGUE EVERY 5 (FIVE) MINUTES AS NEEDED FOR CHEST PAIN. 25 tablet 0   omeprazole (PRILOSEC) 20 MG capsule Take 1 capsule (20 mg total) by mouth daily. 90 capsule 3   sennosides-docusate sodium (SENOKOT-S) 8.6-50 MG tablet Take 1 tablet by mouth at bedtime.     simvastatin (ZOCOR) 40 MG tablet Take 1 tablet (40 mg total) by mouth at bedtime. 90 tablet 3   sulindac (CLINORIL) 200 MG tablet Take 200 mg by mouth 2 (two) times daily.     temazepam (RESTORIL) 30 MG capsule 1 tab by mouth at bedtime as needed 90 capsule 1   vitamin B-12 (CYANOCOBALAMIN) 500 MCG tablet TAKE ONE TABLET BY MOUTH DAILY FOR B12     No current facility-administered medications on file prior to visit.        ROS:  All others reviewed and negative.  Objective        PE:  BP 132/78 (BP Location: Right Arm,  Patient Position: Sitting, Cuff Size: Large)   Pulse 62   Temp 97.6 F (36.4 C) (Oral)   Ht 6' (1.829 m)   Wt 245 lb (111.1 kg)   SpO2 95%   BMI 33.23 kg/m                 Constitutional: Pt appears in NAD               HENT: Head: NCAT.                Right Ear: External ear normal.                 Left Ear: External ear normal.                Eyes: . Pupils are equal, round, and reactive to light. Conjunctivae and EOM are normal               Nose: without d/c or deformity               Neck: Neck supple. Gross normal ROM               Cardiovascular: Normal rate and regular rhythm.                 Pulmonary/Chest: Effort normal and breath sounds without rales or wheezing.                Abd:  Soft, NT, ND, + BS, no organomegaly               Neurological: Pt is alert. At baseline orientation, motor grossly intact               Skin: Skin is warm. No rashes, no other new lesions, LE edema - none               Psychiatric: Pt behavior is normal without agitation   Micro: none  Cardiac tracings I have personally interpreted today:  none  Pertinent Radiological findings (summarize): none   Lab Results  Component Value  Date   WBC 6.8 12/08/2020   HGB 13.6 12/08/2020   HCT 41.8 12/08/2020   PLT 236.0 12/08/2020   GLUCOSE 108 (H) 09/07/2021   CHOL 118 05/05/2021   TRIG 71 05/05/2021   HDL 34 (L) 05/05/2021   LDLDIRECT 71.0 12/08/2020   LDLCALC 69 05/05/2021   ALT 21 05/05/2021   AST 26 05/05/2021   NA 140 09/07/2021   K 4.9 09/07/2021   CL 104 09/07/2021   CREATININE 0.90 09/07/2021   BUN 26 09/07/2021   CO2 22 09/07/2021   TSH 1.81 12/08/2020   PSA 0.91 12/08/2020   INR 1.1 06/24/2019   HGBA1C 6.3 12/08/2020   Assessment/Plan:  Robert [name] is a 76 y.o. White or Caucasian [1] male with  has a past medical history of ALLERGIC RHINITIS (08/20/2007), Anemia, Aortic atherosclerosis ([place]), Arthritis, Asthma, Barrett esophagus, BENIGN PROSTATIC HYPERTROPHY  (08/20/2007), Coronary artery disease involving native heart with angina pectoris ([place]), ELBOW PAIN, LEFT (04/15/2008), ERECTILE DYSFUNCTION (08/20/2007), Gallbladder polyp (08/27/2015), GERD (gastroesophageal reflux disease), Hepatic cyst (09/06/2015), History of cardiomegaly (05/15/2013), History of meningitis, Hyperlipidemia LDL goal <70, HYPERTENSION, INSOMNIA-SLEEP DISORDER-UNSPEC (03/17/2008), Microcytosis, Mild dilation of ascending aorta ([place]), Myocardial infarction ([place]) (02/1994), OSA on CPAP, PREMATURE VENTRICULAR CONTRACTIONS (08/20/2007), SCROTAL MASS (10/09/2008), URINARY INCONTINENCE, MALE (10/09/2008), and UTI (04/24/2009).  Impaired glucose tolerance Lab Results  Component Value Date   HGBA1C 6.3 12/08/2020   Stable, pt to start rybelsus for sugar control and wt control   Hyperlipidemia type IV Lab Results  Component Value Date   LDLCALC 69 05/05/2021   Stable, pt to continue current statin zocor 40 mg qd   Essential hypertension BP Readings from Last 3 Encounters:  02/28/22 132/78  11/19/21 116/72  08/30/21 126/78   Stable, pt to continue medical treatment norvasc 5 mg qd, tenormin 25 mg qd, lisinopril 10 mg qd   Morbid obesity Some improved recenlty but now platuead - for rybelsus 3 mg qd,  to f/u any worsening symptoms or concerns  Followup: No follow-ups on file.  Cathlean Cower, MD 02/28/2022 9:41 PM [facility]

## 2022-02-28 NOTE — Assessment & Plan Note (Signed)
BP Readings from Last 3 Encounters:  02/28/22 132/78  11/19/21 116/72  08/30/21 126/78   Stable, pt to continue medical treatment norvasc 5 mg qd, tenormin 25 mg qd, lisinopril 10 mg qd

## 2022-02-28 NOTE — Patient Instructions (Signed)
Please take all new medication as prescribed - the rybelsus 3 mg for sugar and wt loss (but if too expensive at [facility], let me know so I can send it to the [facility] in [place])  Please continue all other medications as before, and refills have been done if requested.  Please have the pharmacy call with any other refills you may need.  Please continue your efforts at being more active, low cholesterol diet, and weight control.  You are otherwise up to date with prevention measures today.  Please keep your appointments with your specialists as you may have planned  No other lab work needed today  Please make an Appointment to return in 6 months, or sooner if needed

## 2022-02-28 NOTE — Assessment & Plan Note (Signed)
Some improved recenlty but now platuead - for rybelsus 3 mg qd,  to f/u any worsening symptoms or concerns

## 2022-02-28 NOTE — Assessment & Plan Note (Signed)
Lab Results  Component Value Date   HGBA1C 6.3 12/08/2020   Stable, pt to start rybelsus for sugar control and wt control

## 2022-02-28 NOTE — Assessment & Plan Note (Signed)
Lab Results  Component Value Date   LDLCALC 69 05/05/2021   Stable, pt to continue current statin zocor 40 mg qd

## 2022-03-03 ENCOUNTER — Other Ambulatory Visit: Payer: Self-pay | Admitting: Physician Assistant

## 2022-04-05 NOTE — Progress Notes (Signed)
Office Visit    Patient Name: Robert [name] Date of Encounter: 04/07/2022  Primary Care Provider:  Biagio Borg, MD Primary Cardiologist:  Sherren Mocha, MD Primary Electrophysiologist: None  Chief Complaint    SHAYA ALTAMURA is a 76 y.o. male with PMH of CAD s/p CABG in 1989 and multiple PCI's most recent 12/21, HTN, HLD, PVCs, aortic dilation, GERD who presents today for 25-monthfollow-up of CAD  Past Medical History    Past Medical History:  Diagnosis Date   ALLERGIC RHINITIS 08/20/2007   Anemia    Aortic atherosclerosis ([place]    CT in 08/2021   Arthritis    "back; knees; shoulders"  (06/08/2017)   Asthma    "very slight"    Barrett esophagus    BENIGN PROSTATIC HYPERTROPHY 08/20/2007   Coronary artery disease involving native heart with angina pectoris ([place]    a. s/p CABG x 3 in 1989;  b. MI in 1995 with BMS to VG->RCA;  c. 07/2000 PCI/BMS to native LCX;  d. Known CTO of VG->Diag - multiple caths since then. e. PCI/DES to LCx 08/2020.   ELBOW PAIN, LEFT 04/15/2008   ERECTILE DYSFUNCTION 08/20/2007   Gallbladder polyp 08/27/2015   Noted on [place]Abd   GERD (gastroesophageal reflux disease)    Hepatic cyst 09/06/2015   Large simple right hepatic lobe, noted on [place]d   History of cardiomegaly 05/15/2013   Noted on CXR   History of meningitis    2nd grade, 8th grade, 9th grade, age 89  Hyperlipidemia LDL goal <70    HYPERTENSION    "been on RX since CABG but never a problem" (06/08/2017)   INSOMNIA-SLEEP DISORDER-UNSPEC 03/17/2008   Microcytosis    Mild dilation of ascending aorta ([place]    Echo 12/22: EF 55-60, no RWMA, GR 1 DD, normal RVSF, severe LAE, mild RAE, mild MR aortic root 41, ascending aorta 42 // Chest/Aorta CTA 12/22: Ascending Aorta 41 mm; aortic atherosclerosis   Myocardial infarction ([place] 02[place]1994   OSA on CPAP    PREMATURE VENTRICULAR CONTRACTIONS 08/20/2007   SCROTAL MASS 10/09/2008   URINARY INCONTINENCE, MALE 10/09/2008   UTI  04/24/2009   "just this once" (05/15/2013)   Past Surgical History:  Procedure Laterality Date   ANKLE FRACTURE SURGERY Right [place] X 2   CARDIAC CATHETERIZATION N/A 04/01/2016   Procedure: Left Heart Cath and Cors/Grafts Angiography;  Surgeon: DLeonie Man MD;  [facility];  Service: Cardiovascular;  Laterality: N/A;   CARDIAC CATHETERIZATION N/A 04/01/2016   Procedure: Intravascular Pressure Wire/FFR Study;  Surgeon: DLeonie Man MD;  [facility];  Service: Cardiovascular;  Laterality: N/A;   CARDIAC CATHETERIZATION N/A 04/01/2016   Procedure: Coronary Stent Intervention;  Surgeon: DLeonie Man MD;  [facility];  Service: Cardiovascular;  Laterality: N/A;   CARDIAC CATHETERIZATION  06/08/2017   COLONOSCOPY  08/31/2011   COLONOSCOPY W/ BIOPSIES AND POLYPECTOMY  11/20/2003   adenomatous polyp, internal and external hemorrhoids   CORONARY ANGIOPLASTY  95, 98, 2000, [place] 04/01/2016   "total of 4 stents counting today" (04/01/2016)   CORONARY ARTERY BYPASS GRAFT  1989   triple bypass MUSC   CYSTOSCOPY  08/07/2009   and right hydrocelectomy   FOOT TENDON SURGERY Left 2012   "donor tendon"    HEMI-MICRODISCECTOMY LUMBAR  LAMINECTOMY LEVEL 1 Right 09/18/2013   Procedure: CENTRAL DECOMPRESSION @ L4-5 FOR SPINAL STENOSIS. EXCISION OF SYNOVIAL CYST @ L4-5 RIGHT, MICRODISCECTOMY '@L4'$ -5 RIGHT ;  Surgeon: Tobi Bastos, MD;  [facility];  Service: Orthopedics;  Laterality: Right;   INGUINAL HERNIA REPAIR Bilateral 1970's   "2 on one side; 1 on the other"    KNEE ARTHROSCOPY WITH MEDIAL MENISECTOMY Left 09/18/2018   Procedure: DIAGNOSTIC KNEE ARTHROSCOPY WITH MEDIAL MENISECTOMY;  Surgeon: Latanya Maudlin, MD;  [facility];  Service: Orthopedics;  Laterality: Left;  52mn   LEFT HEART CATH AND CORS/GRAFTS ANGIOGRAPHY N/A 06/08/2017    Procedure: LEFT HEART CATH AND CORS/GRAFTS ANGIOGRAPHY;  Surgeon: BLorretta Harp MD;  [facility];  Service: Cardiovascular;  Laterality: N/A;   LEFT HEART CATH AND CORS/GRAFTS ANGIOGRAPHY N/A 08/28/2020   Procedure: LEFT HEART CATH AND CORS/GRAFTS ANGIOGRAPHY;  Surgeon: CSherren Mocha MD;  [facility];  Service: Cardiovascular;  Laterality: N/A;   LEFT HEART CATHETERIZATION WITH CORONARY ANGIOGRAM Bilateral 05/15/2013   Procedure: LEFT HEART CATHETERIZATION WITH CORONARY ANGIOGRAM;  Surgeon: MWellington Hampshire MD;  [facility];  Service: Cardiovascular;  Laterality: Bilateral;   SHOULDER OPEN ROTATOR CUFF REPAIR Right 2009   Gioffre   SURGERY SCROTAL / TESTICULAR Right 2012   "removed the sac" (05/15/2013)   TONSILLECTOMY AND ADENOIDECTOMY  ~1961   TOTAL KNEE ARTHROPLASTY Left 07/01/2019   Procedure: LEFT TOTAL KNEE ARTHROPLASTY;  Surgeon: AGaynelle Arabian MD;  [facility];  Service: Orthopedics;  Laterality: Left;  573m   TRANSURETHRAL RESECTION OF PROSTATE  10/02/2009   Gyrus transurethral resection    UPPER GI ENDOSCOPY  09/06/2011    Allergies  Allergies  Allergen Reactions   Metoprolol Other (See Comments)    severe bradycardia   Lipitor [Atorvastatin] Other (See Comments)    Worsening reflux   Adhesive [Tape] Rash    History of Present Illness    MaADELARD SANONs a [age]ear old male with the above-mentioned past medical history who presents today for 6-68-monthllow-up of CAD.  He has a history of CAD dating back to 1989 with CABG x 3. He had MI in 1995 treated with BMS to SVG-RCA, 07/2000 had PCI with BMS to native LCx and known CTO of SVG-Diagonal. He had multiple heart catheterizations with stable anatomy.He was last seen by Dr. CooBurt Knack 08/2021 and was reportedly doing good with no reoccurring chest pain or pressure.  He was reportedly exercising at the gym 3 days a week.  He has an extensive CAD history dating back to  1989.  He was admitted to the ED in 08/2020 for NSTEMI that was treated with DES to pLCx with L-LAD patent, S-PDA patent with 50 ISR, mLCx stent patent.  Patient was noted to have ascending aorta measuring 41 mm dilation and was evaluated further with CT angiogram.  CTA findings confirmed 41 mm diameter dilation and annual CTA or MRA was recommended at that time.  Since last being seen in the office patient reports that he has been doing well and has no new cardiac complaints at this time.  He has lost a total of 10 pounds that is intentional and the result of watching his diet.  He is remaining active and is exercising at the gym 3 days a week with cardiovascular and free weight exercises.  Patient denies chest pain, palpitations, dyspnea, PND, orthopnea, nausea, vomiting, dizziness, syncope, edema, weight gain, or early satiety.  Home  Medications    Current Outpatient Medications  Medication Sig Dispense Refill   albuterol (VENTOLIN HFA) 108 (90 Base) MCG/ACT inhaler Inhale 1-2 puffs into the lungs every 6 (six) hours as needed for wheezing or shortness of breath.     amLODipine (NORVASC) 5 MG tablet Take 1 tablet (5 mg total) by mouth daily. 90 tablet 3   aspirin EC 81 MG EC tablet Take 1 tablet (81 mg total) by mouth daily. Swallow whole. 30 tablet 11   atenolol (TENORMIN) 25 MG tablet TAKE 1 TABLET (25 MG TOTAL) BY MOUTH AT BEDTIME. 90 tablet 3   cholecalciferol (VITAMIN D3) 25 MCG (1000 UNIT) tablet Take 1,000 Units by mouth daily.     clopidogrel (PLAVIX) 75 MG tablet Take 1 tablet (75 mg total) by mouth daily. 90 tablet 3   isosorbide mononitrate (IMDUR) 30 MG 24 hr tablet Take 1 tablet (30 mg total) by mouth 2 (two) times daily. 180 tablet 3   lisinopril (ZESTRIL) 10 MG tablet Take 1 tablet (10 mg total) by mouth daily. 90 tablet 3   nitroGLYCERIN (NITROSTAT) 0.4 MG SL tablet PLACE 1 TABLET (0.4 MG TOTAL) UNDER THE TONGUE EVERY 5 (FIVE) MINUTES AS NEEDED FOR CHEST PAIN. 25 tablet 0    omeprazole (PRILOSEC) 20 MG capsule Take 1 capsule (20 mg total) by mouth daily. 90 capsule 3   sennosides-docusate sodium (SENOKOT-S) 8.6-50 MG tablet Take 1 tablet by mouth at bedtime.     simvastatin (ZOCOR) 40 MG tablet Take 1 tablet (40 mg total) by mouth at bedtime. 90 tablet 3   sulindac (CLINORIL) 200 MG tablet Take 200 mg by mouth 2 (two) times daily.     temazepam (RESTORIL) 30 MG capsule 1 tab by mouth at bedtime as needed 90 capsule 1   vitamin B-12 (CYANOCOBALAMIN) 500 MCG tablet TAKE ONE TABLET BY MOUTH DAILY FOR B12     ezetimibe (ZETIA) 10 MG tablet Take 10 mg by mouth daily.     Semaglutide (RYBELSUS) 3 MG TABS Take 3 mg by mouth daily. (Patient not taking: Reported on 04/07/2022) 90 tablet 3   No current facility-administered medications for this visit.     Review of Systems  Please see the history of present illness.    All other systems reviewed and are otherwise negative except as noted above.  Physical Exam    Wt Readings from Last 3 Encounters:  04/07/22 242 lb 3.2 oz (109.9 kg)  02/28/22 245 lb (111.1 kg)  11/19/21 253 lb (114.8 kg)   VS: Vitals:   04/07/22 0922  BP: 108/80  Pulse: (!) 59  SpO2: 92%  ,Body mass index is 32.85 kg/m.  Constitutional:      Appearance: Healthy appearance. Not in distress.  Neck:     Vascular: JVD normal.  Pulmonary:     Effort: Pulmonary effort is normal.     Breath sounds: No wheezing. No rales. Diminished in the bases Cardiovascular:     Normal rate. Regular rhythm. Normal S1. Normal S2.      Murmurs: There is no murmur.  Edema:    Peripheral edema absent.  Abdominal:     Palpations: Abdomen is soft non tender. There is no hepatomegaly.  Skin:    General: Skin is warm and dry.  Neurological:     General: No focal deficit present.     Mental Status: Alert and oriented to person, place and time.     Cranial Nerves: Cranial nerves are intact.  EKG/LABS/Other Studies  Reviewed    ECG personally reviewed by me  today -none completed today  Lab Results  Component Value Date   WBC 6.8 12/08/2020   HGB 13.6 12/08/2020   HCT 41.8 12/08/2020   MCV 72.3 (L) 12/08/2020   PLT 236.0 12/08/2020   Lab Results  Component Value Date   CREATININE 0.90 09/07/2021   BUN 26 09/07/2021   NA 140 09/07/2021   K 4.9 09/07/2021   CL 104 09/07/2021   CO2 22 09/07/2021   Lab Results  Component Value Date   ALT 21 05/05/2021   AST 26 05/05/2021   ALKPHOS 92 05/05/2021   BILITOT 0.4 05/05/2021   Lab Results  Component Value Date   CHOL 118 05/05/2021   HDL 34 (L) 05/05/2021   LDLCALC 69 05/05/2021   LDLDIRECT 71.0 12/08/2020   TRIG 71 05/05/2021   CHOLHDL 3.5 05/05/2021    Lab Results  Component Value Date   HGBA1C 6.3 12/08/2020    Assessment & Plan    1.  Coronary artery disease: -He has a history of CAD dating back to 1989 with CABG x 3 with most recent PCI 08/2020 -Patient reports today that he has not had any complaints of angina or shortness of breath -Continue atenolol, simvastatin, ezetimibe, Plavix and aspirin -Continue Imdur 30 mg and amlodipine  2.  Essential hypertension: -Blood pressure today was well controlled at 108/80 -Continue lisinopril 10 mg, and Norvasc 5 mg daily  3.  Mild dilation of ascending aorta: -Annual CTA recommended for surveillance and has been ordered -Continue blood pressure and statin therapy as noted above to prevent progression of aortic dilation  4.  Hyperlipidemia: -Patient's last LDL was 47 -Continue statin therapy as noted above and heart healthy diet -Patient advised to continue the good work with his exercise routine and diet management  Disposition: Follow-up with Sherren Mocha, MD or APP in 6 months   Medication Adjustments/Labs and Tests Ordered: Current medicines are reviewed at length with the patient today.  Concerns regarding medicines are outlined above.   Signed, Mable Fill, Marissa Nestle, NP 04/07/2022, 10:38 AM [place]

## 2022-04-07 ENCOUNTER — Ambulatory Visit: Payer: Medicare HMO | Admitting: Nurse Practitioner

## 2022-04-07 ENCOUNTER — Encounter: Payer: Self-pay | Admitting: Nurse Practitioner

## 2022-04-07 VITALS — BP 108/80 | HR 59 | Ht 72.0 in | Wt 242.2 lb

## 2022-04-07 DIAGNOSIS — I10 Essential (primary) hypertension: Secondary | ICD-10-CM

## 2022-04-07 DIAGNOSIS — I25119 Atherosclerotic heart disease of native coronary artery with unspecified angina pectoris: Secondary | ICD-10-CM

## 2022-04-07 DIAGNOSIS — I77810 Thoracic aortic ectasia: Secondary | ICD-10-CM

## 2022-04-07 DIAGNOSIS — E785 Hyperlipidemia, unspecified: Secondary | ICD-10-CM

## 2022-04-07 NOTE — Patient Instructions (Signed)
Medication Instructions:  Your physician recommends that you continue on your current medications as directed. Please refer to the Current Medication list given to you today.  *If you need a refill on your cardiac medications before your next appointment, please call your pharmacy*  Follow-Up: At [facility], you and your health needs are our priority.  As part of our continuing mission to provide you with exceptional heart care, we have created designated Provider Care Teams.  These Care Teams include your primary Cardiologist (physician) and Advanced Practice Providers (APPs -  Physician Assistants and Nurse Practitioners) who all work together to provide you with the care you need, when you need it.  We recommend signing up for the patient portal called "MyChart".  Sign up information is provided on this After Visit Summary.  MyChart is used to connect with patients for Virtual Visits (Telemedicine).  Patients are able to view lab/test results, encounter notes, upcoming appointments, etc.  Non-urgent messages can be sent to your provider as well.   To learn more about what you can do with MyChart, go to [email or web address].    Your next appointment:   6 month(s)  The format for your next appointment:   In Person  Provider:   Sherren Mocha, MD {  : Important Information About Sugar

## 2022-04-27 ENCOUNTER — Telehealth: Payer: Self-pay

## 2022-04-27 MED ORDER — TEMAZEPAM 30 MG PO CAPS: ORAL_CAPSULE | ORAL | 1 refills | Status: DC

## 2022-04-27 NOTE — Telephone Encounter (Signed)
Pt has stated he is on his last bottle of temazepam (RESTORIL) 30 MG capsule  and is in need of refills for the rest of the year.  LOV: 02/28/2022

## 2022-04-27 NOTE — Telephone Encounter (Signed)
Done erx 

## 2022-07-07 ENCOUNTER — Other Ambulatory Visit: Payer: Self-pay | Admitting: Internal Medicine

## 2022-07-19 ENCOUNTER — Encounter: Payer: Self-pay | Admitting: Emergency Medicine

## 2022-07-19 ENCOUNTER — Ambulatory Visit (INDEPENDENT_AMBULATORY_CARE_PROVIDER_SITE_OTHER): Payer: Medicare HMO | Admitting: Emergency Medicine

## 2022-07-19 ENCOUNTER — Ambulatory Visit: Payer: Medicare HMO | Admitting: Emergency Medicine

## 2022-07-19 VITALS — BP 122/66 | HR 64 | Temp 98.1°F | Ht 72.0 in | Wt 235.4 lb

## 2022-07-19 DIAGNOSIS — Z8669 Personal history of other diseases of the nervous system and sense organs: Secondary | ICD-10-CM

## 2022-07-19 DIAGNOSIS — J22 Unspecified acute lower respiratory infection: Secondary | ICD-10-CM

## 2022-07-19 DIAGNOSIS — I10 Essential (primary) hypertension: Secondary | ICD-10-CM

## 2022-07-19 DIAGNOSIS — R051 Acute cough: Secondary | ICD-10-CM | POA: Diagnosis not present

## 2022-07-19 MED ORDER — BENZONATATE 200 MG PO CAPS: 200.0000 mg | ORAL_CAPSULE | Freq: Two times a day (BID) | ORAL | 0 refills | Status: DC | PRN

## 2022-07-19 MED ORDER — AZITHROMYCIN 250 MG PO TABS: ORAL_TABLET | ORAL | 0 refills | Status: DC

## 2022-07-19 NOTE — Assessment & Plan Note (Signed)
Viral upper respiratory infection now with secondary bacterial infection. May benefit from daily azithromycin for 5 days.

## 2022-07-19 NOTE — Assessment & Plan Note (Addendum)
Continue over-the-counter Mucinex DM and cough drops. Start Tessalon 200 mg 3 times a day as needed. Advised to rest and stay well-hydrated. Advised to contact the office if no better or worse during the next several days.

## 2022-07-19 NOTE — Progress Notes (Signed)
Robert [name] 76 y.o.   Chief Complaint  Patient presents with   Cough    X 1 week, covid test neg   Sore Throat    HISTORY OF PRESENT ILLNESS: Acute problem visit today.  Patient of Dr. Cathlean Cower. This is a 76 y.o. male complaining of productive cough since last week. Mild sore throat as well.  Denies difficulty breathing denies chest pain. No other associated symptoms. No other complaints or medical concerns today.  Cough Pertinent negatives include no chest pain, chills, fever, headaches, rash, sore throat or shortness of breath.  Sore Throat  Associated symptoms include coughing. Pertinent negatives include no abdominal pain, congestion, headaches, shortness of breath or vomiting.     Prior to Admission medications   Medication Sig Start Date End Date Taking? Authorizing Provider  albuterol (VENTOLIN HFA) 108 (90 Base) MCG/ACT inhaler Inhale 1-2 puffs into the lungs every 6 (six) hours as needed for wheezing or shortness of breath.   Yes [provider]  amLODipine (NORVASC) 5 MG tablet Take 1 tablet (5 mg total) by mouth daily. 11/19/21  Yes Biagio Borg, MD  atenolol (TENORMIN) 25 MG tablet TAKE 1 TABLET (25 MG TOTAL) BY MOUTH AT BEDTIME. 12/20/21  Yes Biagio Borg, MD  cholecalciferol (VITAMIN D3) 25 MCG (1000 UNIT) tablet Take 1,000 Units by mouth daily.   Yes [provider]  clopidogrel (PLAVIX) 75 MG tablet Take 1 tablet (75 mg total) by mouth daily. 11/19/21  Yes Biagio Borg, MD  ezetimibe (ZETIA) 10 MG tablet Take 10 mg by mouth daily. 12/24/21  Yes [provider]  isosorbide mononitrate (IMDUR) 30 MG 24 hr tablet Take 1 tablet (30 mg total) by mouth 2 (two) times daily. 11/19/21  Yes Biagio Borg, MD  lisinopril (ZESTRIL) 10 MG tablet Take 1 tablet (10 mg total) by mouth daily. 11/19/21  Yes Biagio Borg, MD  nitroGLYCERIN (NITROSTAT) 0.4 MG SL tablet PLACE 1 TABLET (0.4 MG TOTAL) UNDER THE TONGUE EVERY 5 (FIVE) MINUTES AS NEEDED FOR CHEST  PAIN. 12/03/21  Yes Biagio Borg, MD  omeprazole (PRILOSEC) 20 MG capsule Take 1 capsule (20 mg total) by mouth daily. 11/19/21  Yes Biagio Borg, MD  sennosides-docusate sodium (SENOKOT-S) 8.6-50 MG tablet Take 1 tablet by mouth at bedtime.   Yes [provider]  simvastatin (ZOCOR) 40 MG tablet Take 1 tablet (40 mg total) by mouth at bedtime. 11/19/21  Yes Biagio Borg, MD  sulindac (CLINORIL) 200 MG tablet Take 200 mg by mouth 2 (two) times daily.   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[place]) 09/16/2021   Aortic atherosclerosis ([place]) 09/16/2021   NSTEMI (non-ST elevated myocardial infarction) ([place])    OA (osteoarthritis) of knee 07/01/2019   Acute hearing loss, right 09/21/2018   Finger laceration 10/09/2017   Cough 10/17/2016  Right-sided chest pain 10/11/2016   Corn of foot 06/10/2016   Chest pain 04/01/2016   Coronary artery disease involving native heart with angina pectoris (HCC)    S/P CABG x 3    Hypertensive heart disease with heart failure (HCC)    Angina pectoris ([place])    Hearing loss of both ears 05/21/2015   Rash 05/21/2015   Rash and nonspecific skin eruption 09/24/2014   Spinal stenosis, lumbar region, with neurogenic claudication 09/18/2013   OSA (obstructive sleep apnea) 05/16/2013   Morbid obesity ([place]) 05/16/2013   Barrett's esophagus 09/12/2011   Iron deficiency anemia,  unspecified 08/24/2011   Long term current use of Plavix and aspirin due to coronary artery disease 08/19/2011   Personal history of adenomatous colonic polyps 04/30/2011   Impaired glucose tolerance 04/29/2011   Microcytosis 04/29/2011   Preventative health care 04/29/2011   SCROTAL MASS 10/09/2008   URINARY INCONTINENCE, MALE 10/09/2008   HYPERLIPIDEMIA TYPE I / IV 09/03/2008   ELBOW PAIN, LEFT 04/15/2008   INSOMNIA-SLEEP DISORDER-UNSPEC 03/17/2008   Hyperlipidemia type IV 08/20/2007   ERECTILE DYSFUNCTION 08/20/2007   Essential hypertension 08/20/2007   PREMATURE VENTRICULAR CONTRACTIONS 08/20/2007   ALLERGIC RHINITIS 08/20/2007   BENIGN PROSTATIC HYPERTROPHY 08/20/2007   LOW BACK PAIN 08/20/2007    Past Medical History:  Diagnosis Date   ALLERGIC RHINITIS 08/20/2007   Anemia    Aortic atherosclerosis ([place])    CT in 08/2021   Arthritis    "back; knees; shoulders"  (06/08/2017)   Asthma    "very slight"    Barrett esophagus    BENIGN PROSTATIC HYPERTROPHY 08/20/2007   Coronary artery disease involving native heart with angina pectoris ([place])    a. s/p CABG x 3 in 1989;  b. MI in 1995 with BMS to VG->RCA;  c. 07/2000 PCI/BMS to native LCX;  d. Known CTO of VG->Diag - multiple caths since then. e. PCI/DES to LCx 08/2020.   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[place])    Echo 12/22: EF 55-60, no RWMA, GR 1 DD, normal RVSF, severe LAE, mild RAE, mild MR aortic root 41,  ascending aorta 42 // Chest/Aorta CTA 12/22: Ascending Aorta 41 mm; aortic atherosclerosis   Myocardial infarction ([place]) 02/1994   OSA on CPAP    PREMATURE VENTRICULAR CONTRACTIONS 08/20/2007   SCROTAL MASS 10/09/2008   URINARY INCONTINENCE, MALE 10/09/2008   UTI 04/24/2009   "just this once" (05/15/2013)    Past Surgical History:  Procedure Laterality Date   ANKLE FRACTURE SURGERY Right [place]  X 2   CARDIAC CATHETERIZATION N/A 04/01/2016   Procedure: Left Heart Cath and Cors/Grafts Angiography;  Surgeon: Leonie Man, MD;  Location: [facility];  Service: Cardiovascular;  Laterality: N/A;   CARDIAC CATHETERIZATION N/A 04/01/2016   Procedure: Intravascular Pressure Wire/FFR Study;  Surgeon: Leonie Man,  MD;  Location: [facility];  Service: Cardiovascular;  Laterality: N/A;   CARDIAC CATHETERIZATION N/A 04/01/2016   Procedure: Coronary Stent Intervention;  Surgeon: Leonie Man, MD;  Location: [facility];  Service: Cardiovascular;  Laterality: N/A;   CARDIAC CATHETERIZATION  06/08/2017   COLONOSCOPY  08/31/2011   COLONOSCOPY W/ BIOPSIES AND POLYPECTOMY  11/20/2003   adenomatous polyp, internal and external hemorrhoids   CORONARY ANGIOPLASTY  95, 98, 2000, [place]- 04/01/2016   "total of 4 stents counting today" (04/01/2016)   CORONARY ARTERY BYPASS GRAFT  1989   triple bypass MUSC   CYSTOSCOPY  08/07/2009   and right hydrocelectomy   FOOT TENDON SURGERY Left 2012   "donor tendon"    HEMI-MICRODISCECTOMY LUMBAR LAMINECTOMY LEVEL 1 Right 09/18/2013   Procedure: CENTRAL DECOMPRESSION @ L4-5 FOR SPINAL STENOSIS. EXCISION OF SYNOVIAL CYST @ L4-5 RIGHT, MICRODISCECTOMY '@L4'$ -5 RIGHT ;  Surgeon: Tobi Bastos, MD;  Location: [facility];  Service: Orthopedics;  Laterality: Right;   INGUINAL HERNIA REPAIR Bilateral 1970's   "2 on one side; 1 on the other"    KNEE ARTHROSCOPY WITH  MEDIAL MENISECTOMY Left 09/18/2018   Procedure: DIAGNOSTIC KNEE ARTHROSCOPY WITH MEDIAL MENISECTOMY;  Surgeon: Latanya Maudlin, MD;  Location: [facility];  Service: Orthopedics;  Laterality: Left;  59mn   LEFT HEART CATH AND CORS/GRAFTS ANGIOGRAPHY N/A 06/08/2017   Procedure: LEFT HEART CATH AND CORS/GRAFTS ANGIOGRAPHY;  Surgeon: BLorretta Harp MD;  [facility];  Service: Cardiovascular;  Laterality: N/A;   LEFT HEART CATH AND CORS/GRAFTS ANGIOGRAPHY N/A 08/28/2020   Procedure: LEFT HEART CATH AND CORS/GRAFTS ANGIOGRAPHY;  Surgeon: CSherren Mocha MD;  [facility];  Service: Cardiovascular;  Laterality: N/A;   LEFT HEART CATHETERIZATION WITH CORONARY ANGIOGRAM Bilateral 05/15/2013   Procedure: LEFT HEART CATHETERIZATION WITH CORONARY ANGIOGRAM;  Surgeon: MWellington Hampshire MD;  [facility];  Service: Cardiovascular;  Laterality: Bilateral;   SHOULDER OPEN ROTATOR CUFF REPAIR Right 2009   [place]   SURGERY SCROTAL / TESTICULAR Right 2012   "removed the sac" (05/15/2013)   TONSILLECTOMY AND ADENOIDECTOMY  ~1961   TOTAL KNEE ARTHROPLASTY Left 07/01/2019   Procedure: LEFT TOTAL KNEE ARTHROPLASTY;  Surgeon: AGaynelle Arabian MD;  Location: [facility];  Service: Orthopedics;  Laterality: Left;  527m   TRANSURETHRAL RESECTION OF PROSTATE  10/02/2009   [place] transurethral resection    UPPER GI ENDOSCOPY  09/06/2011    Social History   Socioeconomic History   Marital status: Married    Spouse name: Not on file   Number of children: 1   Years of education: Not on file   Highest education level: Not on file  Occupational History   Occupation: retired [employer]    Comment: part time golf course  Tobacco Use   Smoking status: Former    Packs/day: 1.00    Years: 45.00    Total pack years: 45.00    Types: Cigarettes    Quit date: 04/23/1994    Years since quitting: 28.2   Smokeless tobacco: Never  Vaping Use   Vaping Use: Never used   Substance and Sexual Activity   Alcohol use: Not Currently    Comment:  "I've  quit drinking in 1989"   Drug use: No   Sexual activity: Yes  Other Topics Concern   Not on file  Social History Narrative   Not on file  Social Determinants of Health   Financial Resource Strain: Not on file  Food Insecurity: Not on file  Transportation Needs: Not on file  Physical Activity: Not on file  Stress: Not on file  Social Connections: Not on file  Intimate Partner Violence: Not on file    Family History  Problem Relation Age of Onset   Coronary artery disease Father    Coronary artery disease Mother        pacemaker   Heart disease Brother    Esophageal cancer Brother      Review of Systems  Constitutional: Negative.  Negative for chills and fever.  HENT: Negative.  Negative for congestion and sore throat.   Respiratory:  Positive for cough and sputum production. Negative for shortness of breath.   Cardiovascular: Negative.  Negative for chest pain and palpitations.  Gastrointestinal: Negative.  Negative for abdominal pain, nausea and vomiting.  Skin: Negative.  Negative for rash.  Neurological: Negative.  Negative for dizziness and headaches.  All other systems reviewed and are negative.  Today's Vitals   07/19/22 1453  BP: 122/66  Pulse: 64  Temp: 98.1 F (36.7 C)  TempSrc: Oral  SpO2: 97%  Weight: 235 lb 6 oz (106.8 kg)  Height: 6' (1.829 m)   Body mass index is 31.92 kg/m.   Physical Exam Vitals reviewed.  Constitutional:      Appearance: Normal appearance. He is well-developed.  HENT:     Head: Normocephalic.     Right Ear: Tympanic membrane, ear canal and external ear normal.     Left Ear: Tympanic membrane, ear canal and external ear normal.     Mouth/Throat:     Mouth: Mucous membranes are moist.     Pharynx: Oropharynx is clear.  Eyes:     Extraocular Movements: Extraocular movements intact.     Conjunctiva/sclera: Conjunctivae normal.     Pupils:  Pupils are equal, round, and reactive to light.  Cardiovascular:     Rate and Rhythm: Normal rate and regular rhythm.     Pulses: Normal pulses.     Heart sounds: Normal heart sounds.  Pulmonary:     Effort: Pulmonary effort is normal.     Breath sounds: Normal breath sounds.  Abdominal:     Palpations: Abdomen is soft.     Tenderness: There is no abdominal tenderness.  Musculoskeletal:     Cervical back: No tenderness.  Lymphadenopathy:     Cervical: No cervical adenopathy.  Skin:    General: Skin is warm and dry.  Neurological:     General: No focal deficit present.     Mental Status: He is alert and oriented to person, place, and time.  Psychiatric:        Mood and Affect: Mood normal.        Behavior: Behavior normal.      ASSESSMENT & PLAN: A total of 42 minutes was spent with the patient and counseling/coordination of care regarding preparing for this visit, review of most recent office visit notes and available medical records, review of multiple chronic medical problems and their management, review of all medications, diagnosis of lower respiratory tract infection and need for antibiotics and cough medication, cough management, prognosis, documentation, and need for follow-up if no better or worse during the next several days.  Problem List Items Addressed This Visit       Cardiovascular and Mediastinum   Essential hypertension (Chronic)    Well-controlled.  Continue lisinopril 10 mg and amlodipine  5 mg daily. BP Readings from Last 3 Encounters:  07/19/22 122/66  04/07/22 108/80  02/28/22 132/78           Respiratory   Lower respiratory infection    Viral upper respiratory infection now with secondary bacterial infection. May benefit from daily azithromycin for 5 days.      Relevant Medications   azithromycin (ZITHROMAX) 250 MG tablet     Other   Cough - Primary    Continue over-the-counter Mucinex DM and cough drops. Start Tessalon 200 mg 3 times a  day as needed. Advised to rest and stay well-hydrated. Advised to contact the office if no better or worse during the next several days.      Relevant Medications   benzonatate (TESSALON) 200 MG capsule   History of obstructive sleep apnea   Patient Instructions  Acute Bronchitis, Adult  Acute bronchitis is when air tubes in the lungs (bronchi) suddenly get swollen. The condition can make it hard for you to breathe. In adults, acute bronchitis usually goes away within 2 weeks. A cough caused by bronchitis may last up to 3 weeks. Smoking, allergies, and asthma can make the condition worse. What are the causes? Germs that cause cold and flu (viruses). The most common cause of this condition is the virus that causes the common cold. Bacteria. Substances that bother (irritate) the lungs, including: Smoke from cigarettes and other types of tobacco. Dust and pollen. Fumes from chemicals, gases, or burned fuel. Indoor or outdoor air pollution. What increases the risk? A weak body's defense system. This is also called the immune system. Any condition that affects your lungs and breathing, such as asthma. What are the signs or symptoms? A cough. Coughing up clear, yellow, or green mucus. Making high-pitched whistling sounds when you breathe, most often when you breathe out (wheezing). Runny or stuffy nose. Having too much mucus in your lungs (chest congestion). Shortness of breath. Body aches. A sore throat. How is this treated? Acute bronchitis may go away over time without treatment. Your doctor may tell you to: Drink more fluids. This will help thin your mucus so it is easier to cough up. Use a device that gets medicine into your lungs (inhaler). Use a vaporizer or a humidifier. These are machines that add water to the air. This helps with coughing and poor breathing. Take a medicine that thins mucus and helps clear it from your lungs. Take a medicine that prevents or stops  coughing. It is not common to take an antibiotic medicine for this condition. Follow these instructions at home:  Take over-the-counter and prescription medicines only as told by your doctor. Use an inhaler, vaporizer, or humidifier as told by your doctor. Take two teaspoons (10 mL) of honey at bedtime. This helps lessen your coughing at night. Drink enough fluid to keep your pee (urine) pale yellow. Do not smoke or use any products that contain nicotine or tobacco. If you need help quitting, ask your doctor. Get a lot of rest. Return to your normal activities when your doctor says that it is safe. Keep all follow-up visits. How is this prevented?  Wash your hands often with soap and water for at least 20 seconds. If you cannot use soap and water, use hand sanitizer. Avoid contact with people who have cold symptoms. Try not to touch your mouth, nose, or eyes with your hands. Avoid breathing in smoke or chemical fumes. Make sure to get the flu shot every year. Contact a  doctor if: Your symptoms do not get better in 2 weeks. You have trouble coughing up the mucus. Your cough keeps you awake at night. You have a fever. Get help right away if: You cough up blood. You have chest pain. You have very bad shortness of breath. You faint or keep feeling like you are going to faint. You have a very bad headache. Your fever or chills get worse. These symptoms may be an emergency. Get help right away. Call your local emergency services (911 in the [place] Do not wait to see if the symptoms will go away. Do not drive yourself to the hospital. Summary Acute bronchitis is when air tubes in the lungs (bronchi) suddenly get swollen. In adults, acute bronchitis usually goes away within 2 weeks. Drink more fluids. This will help thin your mucus so it is easier to cough up. Take over-the-counter and prescription medicines only as told by your doctor. Contact a doctor if your symptoms do not improve  after 2 weeks of treatment. This information is not intended to replace advice given to you by your health care provider. Make sure you discuss any questions you have with your health care provider. Document Revised: 01/06/2021 Document Reviewed: 01/06/2021 Elsevier Patient Education  [place], MD [facility]

## 2022-07-19 NOTE — Patient Instructions (Signed)
  Acute Bronchitis, Adult  Acute bronchitis is when air tubes in the lungs (bronchi) suddenly get swollen. The condition can make it hard for you to breathe. In adults, acute bronchitis usually goes away within 2 weeks. A cough caused by bronchitis may last up to 3 weeks. Smoking, allergies, and asthma can make the condition worse. What are the causes? Germs that cause cold and flu (viruses). The most common cause of this condition is the virus that causes the common cold. Bacteria. Substances that bother (irritate) the lungs, including: Smoke from cigarettes and other types of tobacco. Dust and pollen. Fumes from chemicals, gases, or burned fuel. Indoor or outdoor air pollution. What increases the risk? A weak body's defense system. This is also called the immune system. Any condition that affects your lungs and breathing, such as asthma. What are the signs or symptoms? A cough. Coughing up clear, yellow, or green mucus. Making high-pitched whistling sounds when you breathe, most often when you breathe out (wheezing). Runny or stuffy nose. Having too much mucus in your lungs (chest congestion). Shortness of breath. Body aches. A sore throat. How is this treated? Acute bronchitis may go away over time without treatment. Your doctor may tell you to: Drink more fluids. This will help thin your mucus so it is easier to cough up. Use a device that gets medicine into your lungs (inhaler). Use a vaporizer or a humidifier. These are machines that add water to the air. This helps with coughing and poor breathing. Take a medicine that thins mucus and helps clear it from your lungs. Take a medicine that prevents or stops coughing. It is not common to take an antibiotic medicine for this condition. Follow these instructions at home:  Take over-the-counter and prescription medicines only as told by your doctor. Use an inhaler, vaporizer, or humidifier as told by your doctor. Take two  teaspoons (10 mL) of honey at bedtime. This helps lessen your coughing at night. Drink enough fluid to keep your pee (urine) pale yellow. Do not smoke or use any products that contain nicotine or tobacco. If you need help quitting, ask your doctor. Get a lot of rest. Return to your normal activities when your doctor says that it is safe. Keep all follow-up visits. How is this prevented?  Wash your hands often with soap and water for at least 20 seconds. If you cannot use soap and water, use hand sanitizer. Avoid contact with people who have cold symptoms. Try not to touch your mouth, nose, or eyes with your hands. Avoid breathing in smoke or chemical fumes. Make sure to get the flu shot every year. Contact a doctor if: Your symptoms do not get better in 2 weeks. You have trouble coughing up the mucus. Your cough keeps you awake at night. You have a fever. Get help right away if: You cough up blood. You have chest pain. You have very bad shortness of breath. You faint or keep feeling like you are going to faint. You have a very bad headache. Your fever or chills get worse. These symptoms may be an emergency. Get help right away. Call your local emergency services (911 in the [place] Do not wait to see if the symptoms will go away. Do not drive yourself to the hospital. Summary Acute bronchitis is when air tubes in the lungs (bronchi) suddenly get swollen. In adults, acute bronchitis usually goes away within 2 weeks. Drink more fluids. This will help thin your mucus so it   is easier to cough up. Take over-the-counter and prescription medicines only as told by your doctor. Contact a doctor if your symptoms do not improve after 2 weeks of treatment. This information is not intended to replace advice given to you by your health care provider. Make sure you discuss any questions you have with your health care provider. Document Revised: 01/06/2021 Document Reviewed: 01/06/2021 Elsevier  Patient Education  2023 Elsevier Inc.  

## 2022-07-19 NOTE — Assessment & Plan Note (Signed)
Well-controlled.  Continue lisinopril 10 mg and amlodipine 5 mg daily. BP Readings from Last 3 Encounters:  07/19/22 122/66  04/07/22 108/80  02/28/22 132/78

## 2022-07-20 ENCOUNTER — Ambulatory Visit: Payer: Medicare HMO | Admitting: Emergency Medicine

## 2022-08-16 ENCOUNTER — Ambulatory Visit: Payer: Medicare HMO

## 2022-09-05 ENCOUNTER — Other Ambulatory Visit: Payer: Self-pay | Admitting: Internal Medicine

## 2022-09-05 NOTE — Telephone Encounter (Signed)
Please refill as per office routine med refill policy (all routine meds to be refilled for 3 mo or monthly (per pt preference) up to one year from last visit, then month to month grace period for 3 mo, then further med refills will have to be denied) ? ?

## 2022-09-16 ENCOUNTER — Inpatient Hospital Stay: Admission: RE | Admit: 2022-09-16 | Payer: Medicare HMO | Source: Ambulatory Visit

## 2022-10-08 ENCOUNTER — Other Ambulatory Visit: Payer: Self-pay | Admitting: Internal Medicine

## 2022-10-09 NOTE — Telephone Encounter (Signed)
Please refill as per office routine med refill policy (all routine meds to be refilled for 3 mo or monthly (per pt preference) up to one year from last visit, then month to month grace period for 3 mo, then further med refills will have to be denied)

## 2022-10-27 ENCOUNTER — Other Ambulatory Visit: Payer: Self-pay | Admitting: Internal Medicine

## 2022-11-08 NOTE — Progress Notes (Unsigned)
Office Visit    Patient Name: Robert [name] Date of Encounter: 11/10/2022  Primary Care Provider:  Biagio Borg, MD Primary Cardiologist:  Robert Mocha, MD Primary Electrophysiologist: None  Chief Complaint    Robert [name] is a 77 y.o. male with PMH of CAD s/p CABG in 1989 and multiple PCI's most recent 12/21, HTN, HLD, PVCs, aortic dilation, GERD who presents today for 36-monthfollow-up of CAD   Past Medical History    Past Medical History:  Diagnosis Date   ALLERGIC RHINITIS 08/20/2007   Anemia    Aortic atherosclerosis ([facility]    CT in 08/2021   Arthritis    "back; knees; shoulders"  (06/08/2017)   Asthma    "very slight"    Barrett esophagus    BENIGN PROSTATIC HYPERTROPHY 08/20/2007   Coronary artery disease involving native heart with angina pectoris ([place]    a. s/p CABG x 3 in 1989;  b. MI in 1995 with BMS to VG->RCA;  c. 07/2000 PCI/BMS to native LCX;  d. Known CTO of VG->Diag - multiple caths since then. e. PCI/DES to LCx 08/2020.   ELBOW PAIN, LEFT 04/15/2008   ERECTILE DYSFUNCTION 08/20/2007   Gallbladder polyp 08/27/2015   Noted on [place]   GERD (gastroesophageal reflux disease)    Hepatic cyst 09/06/2015   Large simple right hepatic lobe, noted on [place]d   History of cardiomegaly 05/15/2013   Noted on CXR   History of meningitis    2nd grade, 8th grade, 9th grade, age 77  Hyperlipidemia LDL goal <70    HYPERTENSION    "been on RX since CABG but never a problem" (06/08/2017)   INSOMNIA-SLEEP DISORDER-UNSPEC 03/17/2008   Microcytosis    Mild dilation of ascending aorta ([place]    Echo 12/22: EF 55-60, no RWMA, GR 1 DD, normal RVSF, severe LAE, mild RAE, mild MR aortic root 41, ascending aorta 42 // Chest/Aorta CTA 12/22: Ascending Aorta 41 mm; aortic atherosclerosis   [place]ocardial infarction ([place] 02/1994   OSA on CPAP    PREMATURE VENTRICULAR CONTRACTIONS 08/20/2007   SCROTAL MASS 10/09/2008   URINARY INCONTINENCE, MALE 10/09/2008   UTI  04/24/2009   "just this once" (05/15/2013)   Past Surgical History:  Procedure Laterality Date   ANKLE FRACTURE SURGERY Right [place] X 2   CARDIAC CATHETERIZATION N/A 04/01/2016   Procedure: Left Heart Cath and Cors/Grafts Angiography;  Surgeon: Robert Man MD;  [facility];  Service: Cardiovascular;  Laterality: N/A;   CARDIAC CATHETERIZATION N/A 04/01/2016   Procedure: Intravascular Pressure Wire/FFR Study;  Surgeon: Robert Man MD;  [facility];  Service: Cardiovascular;  Laterality: N/A;   CARDIAC CATHETERIZATION N/A 04/01/2016   Procedure: Coronary Stent Intervention;  Surgeon: Robert Man MD;  [facility];  Service: Cardiovascular;  Laterality: N/A;   CARDIAC CATHETERIZATION  06/08/2017   COLONOSCOPY  08/31/2011   COLONOSCOPY W/ BIOPSIES AND POLYPECTOMY  11/20/2003   adenomatous polyp, internal and external hemorrhoids   CORONARY ANGIOPLASTY  95, 98, 2000, [place] 04/01/2016   "total of 4 stents counting today" (04/01/2016)   CORONARY ARTERY BYPASS GRAFT  1989   triple bypass MUSC   CYSTOSCOPY  08/07/2009   and right hydrocelectomy   FOOT TENDON SURGERY Left 2012   "donor tendon"    HEMI-MICRODISCECTOMY  LUMBAR LAMINECTOMY LEVEL 1 Right 09/18/2013   Procedure: CENTRAL DECOMPRESSION @ L4-5 FOR SPINAL STENOSIS. EXCISION OF SYNOVIAL CYST @ L4-5 RIGHT, MICRODISCECTOMY @L4$ -5 RIGHT ;  Surgeon: Robert Bastos, MD;  Location: [facility];  Service: Orthopedics;  Laterality: Right;   INGUINAL HERNIA REPAIR Bilateral 1970's   "2 on one side; 1 on the other"    KNEE ARTHROSCOPY WITH MEDIAL MENISECTOMY Left 09/18/2018   Procedure: DIAGNOSTIC KNEE ARTHROSCOPY WITH MEDIAL MENISECTOMY;  Surgeon: Robert Maudlin, MD;  Location: [facility];  Service: Orthopedics;  Laterality: Left;  81mn   LEFT HEART CATH AND CORS/GRAFTS ANGIOGRAPHY N/A 06/08/2017    Procedure: LEFT HEART CATH AND CORS/GRAFTS ANGIOGRAPHY;  Surgeon: BLorretta Harp MD;  [facility];  Service: Cardiovascular;  Laterality: N/A;   LEFT HEART CATH AND CORS/GRAFTS ANGIOGRAPHY N/A 08/28/2020   Procedure: LEFT HEART CATH AND CORS/GRAFTS ANGIOGRAPHY;  Surgeon: Robert Mocha MD;  [facility];  Service: Cardiovascular;  Laterality: N/A;   LEFT HEART CATHETERIZATION WITH CORONARY ANGIOGRAM Bilateral 05/15/2013   Procedure: LEFT HEART CATHETERIZATION WITH CORONARY ANGIOGRAM;  Surgeon: MWellington Hampshire MD;  [facility];  Service: Cardiovascular;  Laterality: Bilateral;   SHOULDER OPEN ROTATOR CUFF REPAIR Right 2009   Robert [name]   SURGERY SCROTAL / TESTICULAR Right 2012   "removed the sac" (05/15/2013)   TONSILLECTOMY AND ADENOIDECTOMY  ~1961   TOTAL KNEE ARTHROPLASTY Left 07/01/2019   Procedure: LEFT TOTAL KNEE ARTHROPLASTY;  Surgeon: AGaynelle Arabian MD;  [facility];  Service: Orthopedics;  Laterality: Left;  537m   TRANSURETHRAL RESECTION OF PROSTATE  10/02/2009   [place] transurethral resection    UPPER GI ENDOSCOPY  09/06/2011    Allergies  Allergies  Allergen Reactions   Metoprolol Other (See Comments)    severe bradycardia   Adhesive [Tape] Rash   Lipitor [Atorvastatin] Other (See Comments)    Worsening reflux    History of Present Illness    Robert MENDOSAs a [age]ear old male with the above-mentioned past medical history who presents today for 6-86-monthllow-up of CAD.  He has a history of CAD dating back to 1989 with CABG x 3. He had MI in 1995 treated with BMS to SVG-RCA, 07/2000 had PCI with BMS to native LCx and known CTO of SVG-Diagonal. He had multiple heart catheterizations with stable anatomy.He was last seen by Dr. CooBurt [name] 08/2021 and was reportedly doing good with no reoccurring chest pain or pressure.  He was reportedly exercising at the gym 3 days a week.  He has an extensive CAD history dating back to  1989.  He was admitted to the ED in 08/2020 for NSTEMI that was treated with DES to pLCx with L-LAD patent, S-PDA patent with 50 ISR, mLCx stent patent.  Patient was noted to have ascending aorta measuring 41 mm dilation and was evaluated further with CT angiogram.  CTA findings confirmed 41 mm diameter dilation and annual CTA or MRA was recommended at that time.  During patient's follow-up he reports losing an intentional 10 pounds of weight due to watching his diet and increasing physical activity.  He was exercise at the gym 3 days/week.  Patient had no complaints of shortness of breath or angina.   Robert [name] today via virtual visit for 6-m5-monthlow-up.  Patient's identity was verified via 2 identifiers and medications and vital signs were verified by CMA.  Since last being seen in the office patient reports that he has been  doing well and has lost an additional 5 pounds intentionally with adjustments to his diet and exercise.  He reports no cardiac complaints since his previous visit.  He denies any chest discomfort and is compliant with his current medication regimen.  His blood pressures have been well managed and are currently 128/76 and heart rate was 60 bpm.  Patient denies chest pain, palpitations, dyspnea, PND, orthopnea, nausea, vomiting, dizziness, syncope, edema, weight gain, or early satiety.  Home Medications    Current Outpatient Medications  Medication Sig Dispense Refill   albuterol (VENTOLIN HFA) 108 (90 Base) MCG/ACT inhaler Inhale 1-2 puffs into the lungs every 6 (six) hours as needed for wheezing or shortness of breath.     amLODipine (NORVASC) 5 MG tablet Take 1 tablet (5 mg total) by mouth daily. 90 tablet 3   amoxicillin (AMOXIL) 500 MG tablet Take 2,000 mg by mouth as directed. For dental procedures     atenolol (TENORMIN) 25 MG tablet TAKE 1 TABLET (25 MG TOTAL) BY MOUTH AT BEDTIME. 90 tablet 3   benzonatate (TESSALON) 200 MG capsule Take 1 capsule (200 mg  total) by mouth 2 (two) times daily as needed for cough. 20 capsule 0   clopidogrel (PLAVIX) 75 MG tablet Take 1 tablet (75 mg total) by mouth daily. 90 tablet 3   ezetimibe (ZETIA) 10 MG tablet Take 10 mg by mouth daily.     isosorbide mononitrate (IMDUR) 30 MG 24 hr tablet Take 1 tablet (30 mg total) by mouth 2 (two) times daily. 180 tablet 3   lisinopril (ZESTRIL) 10 MG tablet Take 1 tablet (10 mg total) by mouth daily. 90 tablet 3   nitroGLYCERIN (NITROSTAT) 0.4 MG SL tablet PLACE 1 TABLET (0.4 MG TOTAL) UNDER THE TONGUE EVERY 5 (FIVE) MINUTES AS NEEDED FOR CHEST PAIN. 25 tablet 0   omeprazole (PRILOSEC) 20 MG capsule TAKE 1 CAPSULE EVERY DAY 90 capsule 3   sennosides-docusate sodium (SENOKOT-S) 8.6-50 MG tablet Take 1 tablet by mouth at bedtime.     simvastatin (ZOCOR) 40 MG tablet Take 1 tablet (40 mg total) by mouth at bedtime. 90 tablet 3   sulindac (CLINORIL) 200 MG tablet Take 200 mg by mouth 2 (two) times daily.     temazepam (RESTORIL) 30 MG capsule TAKE 1 CAPSULE BY MOUTH AT BEDTIME AS NEEDED 30 capsule 4   vitamin B-12 (CYANOCOBALAMIN) 500 MCG tablet TAKE ONE TABLET BY MOUTH DAILY FOR B12     No current facility-administered medications for this visit.     Review of Systems  Please see the history of present illness.     All other systems reviewed and are otherwise negative except as noted above.  Physical Exam    Wt Readings from Last 3 Encounters:  11/10/22 231 lb (104.8 kg)  07/19/22 235 lb 6 oz (106.8 kg)  04/07/22 242 lb 3.2 oz (109.9 kg)   VS: Vitals:   11/10/22 1105 11/10/22 1106  BP: 128/76 117/68  Pulse: 60 60  ,Body mass index is 31.33 kg/m.  Constitutional:      Appearance: Healthy appearance. Not in distress.  Neck:     Vascular: JVD normal.  Pulmonary:     Effort: Pulmonary effort is normal.     Breath sounds: No wheezing. No rales. Diminished in the bases Cardiovascular:     Normal rate. Regular rhythm. Normal S1. Normal S2.      Murmurs: There  is no murmur.  Edema:    Peripheral edema absent.  Abdominal:     Palpations: Abdomen is soft non tender. There is no hepatomegaly.  Skin:    General: Skin is warm and dry.  Neurological:     General: No focal deficit present.     Mental Status: Alert and oriented to person, place and time.     Cranial Nerves: Cranial nerves are intact.  EKG/LABS/Other Studies Reviewed    ECG personally reviewed by me today -none completed today  Lab Results  Component Value Date   WBC 6.8 12/08/2020   HGB 13.6 12/08/2020   HCT 41.8 12/08/2020   MCV 72.3 (L) 12/08/2020   PLT 236.0 12/08/2020   Lab Results  Component Value Date   CREATININE 0.90 09/07/2021   BUN 26 09/07/2021   NA 140 09/07/2021   K 4.9 09/07/2021   CL 104 09/07/2021   CO2 22 09/07/2021   Lab Results  Component Value Date   ALT 21 05/05/2021   AST 26 05/05/2021   ALKPHOS 92 05/05/2021   BILITOT 0.4 05/05/2021   Lab Results  Component Value Date   CHOL 118 05/05/2021   HDL 34 (L) 05/05/2021   LDLCALC 69 05/05/2021   LDLDIRECT 71.0 12/08/2020   TRIG 71 05/05/2021   CHOLHDL 3.5 05/05/2021    Lab Results  Component Value Date   HGBA1C 6.3 12/08/2020    Assessment & Plan    1.  Coronary artery disease: -He has a history of CAD dating back to 1989 with CABG x 3 with most recent PCI 08/2020 -Patient reports today that he has not had any complaints of angina or shortness of breath -Continue atenolol, simvastatin, ezetimibe, Plavix and aspirin -Continue Imdur 30 mg and amlodipine -He is currently exercising 3 times a week and reports a total intentional weight loss of 15 pounds.   2.  Essential hypertension: -Blood pressure today was well controlled at 1128/76 -Continue lisinopril 10 mg, and Norvasc 5 mg daily   3.  Mild dilation of ascending aorta: -Annual chest CTA recommended for surveillance and has been ordered -Patient will need BMET prior to repeating -Continue blood pressure and statin therapy as  noted above to prevent progression of aortic dilation   4.  Hyperlipidemia: -Patient's last LDL was 47 -Continue statin therapy as noted above and heart healthy diet -Patient advised to continue the good work with his exercise routine and diet management -We will repeat LFTs and lipids  Disposition: Follow-up with Robert Mocha, MD or APP in 6-8 months months   Medication Adjustments/Labs and Tests Ordered: Current medicines are reviewed at length with the patient today.  Concerns regarding medicines are outlined above.   Signed, Mable Fill, Marissa Nestle, NP 11/10/2022, 11:48 AM [facility]  Note:  This document was prepared using Dragon voice recognition software and may include unintentional dictation errors.

## 2022-11-09 ENCOUNTER — Telehealth: Payer: Self-pay | Admitting: Cardiovascular Disease

## 2022-11-09 NOTE — Telephone Encounter (Signed)
Patient notified and advised to check his weight and blood pressure before his appointment.   Patient agreeable and voiced understanding.

## 2022-11-09 NOTE — Telephone Encounter (Signed)
Good morning, It would be fine to schedule his visit as a televisit due to his transportation concern.  Ambrose Pancoast, NP

## 2022-11-09 NOTE — Telephone Encounter (Signed)
Patient wants to have tele-visit on 2/22 at 11:20 am instead of in-office visit.

## 2022-11-10 ENCOUNTER — Ambulatory Visit: Payer: Medicare HMO | Attending: Nurse Practitioner | Admitting: Nurse Practitioner

## 2022-11-10 ENCOUNTER — Encounter: Payer: Self-pay | Admitting: Nurse Practitioner

## 2022-11-10 ENCOUNTER — Telehealth: Payer: Self-pay

## 2022-11-10 VITALS — BP 117/68 | HR 60 | Ht 72.0 in | Wt 231.0 lb

## 2022-11-10 DIAGNOSIS — E785 Hyperlipidemia, unspecified: Secondary | ICD-10-CM | POA: Diagnosis not present

## 2022-11-10 DIAGNOSIS — I10 Essential (primary) hypertension: Secondary | ICD-10-CM | POA: Diagnosis not present

## 2022-11-10 DIAGNOSIS — I25119 Atherosclerotic heart disease of native coronary artery with unspecified angina pectoris: Secondary | ICD-10-CM

## 2022-11-10 DIAGNOSIS — I77810 Thoracic aortic ectasia: Secondary | ICD-10-CM | POA: Diagnosis not present

## 2022-11-10 NOTE — Telephone Encounter (Signed)
Contacted the patient and went over AVS instuctions, scheduled lab appointment and follow up appointment.   AVS printed and mailed to patient.   Patient agreeable and voiced understanding.

## 2022-11-10 NOTE — Patient Instructions (Addendum)
Medication Instructions:  Your physician recommends that you continue on your current medications as directed. Please refer to the Current Medication list given to you today. *If you need a refill on your cardiac medications before your next appointment, please call your pharmacy*   Lab Work: At your convenience CMET, LFT, & LIPIDS If you have labs (blood work) drawn today and your tests are completely normal, you will receive your results only by: [place] (if you have MyChart) OR A paper copy in the mail If you have any lab test that is abnormal or we need to change your treatment, we will call you to review the results.   Testing/Procedures: CT ANGIO   Follow-Up: At [facility], you and your health needs are our priority.  As part of our continuing mission to provide you with exceptional heart care, we have created designated Provider Care Teams.  These Care Teams include your primary Cardiologist (physician) and Advanced Practice Providers (APPs -  Physician Assistants and Nurse Practitioners) who all work together to provide you with the care you need, when you need it.  We recommend signing up for the patient portal called "MyChart".  Sign up information is provided on this After Visit Summary.  MyChart is used to connect with patients for Virtual Visits (Telemedicine).  Patients are able to view lab/test results, encounter notes, upcoming appointments, etc.  Non-urgent messages can be sent to your provider as well.   To learn more about what you can do with MyChart, go to [email or web address].    Your next appointment:   FIRST AVAILABLE    Provider:   Sherren Mocha, MD     Other Instructions

## 2022-11-10 NOTE — Addendum Note (Signed)
Addended by: Whitman Hero on: 11/10/2022 01:41 PM   Modules accepted: Orders

## 2022-11-15 ENCOUNTER — Ambulatory Visit: Payer: Medicare HMO | Attending: Nurse Practitioner

## 2022-11-15 DIAGNOSIS — E785 Hyperlipidemia, unspecified: Secondary | ICD-10-CM

## 2022-11-15 DIAGNOSIS — I10 Essential (primary) hypertension: Secondary | ICD-10-CM | POA: Diagnosis not present

## 2022-11-15 DIAGNOSIS — I25119 Atherosclerotic heart disease of native coronary artery with unspecified angina pectoris: Secondary | ICD-10-CM

## 2022-11-15 DIAGNOSIS — I77810 Thoracic aortic ectasia: Secondary | ICD-10-CM

## 2022-11-16 ENCOUNTER — Telehealth: Payer: Self-pay | Admitting: Cardiovascular Disease

## 2022-11-16 LAB — COMPREHENSIVE METABOLIC PANEL
ALT: 14 IU/L (ref 0–44)
AST: 17 IU/L (ref 0–40)
Albumin/Globulin Ratio: 1.6 (ref 1.2–2.2)
Albumin: 4.3 g/dL (ref 3.8–4.8)
Alkaline Phosphatase: 97 IU/L (ref 44–121)
BUN/Creatinine Ratio: 19 (ref 10–24)
BUN: 16 mg/dL (ref 8–27)
Bilirubin Total: 0.4 mg/dL (ref 0.0–1.2)
CO2: 25 mmol/L (ref 20–29)
Calcium: 9 mg/dL (ref 8.6–10.2)
Chloride: 105 mmol/L (ref 96–106)
Creatinine, Ser: 0.85 mg/dL (ref 0.76–1.27)
Globulin, Total: 2.7 g/dL (ref 1.5–4.5)
Glucose: 95 mg/dL (ref 70–99)
Potassium: 4.9 mmol/L (ref 3.5–5.2)
Sodium: 142 mmol/L (ref 134–144)
Total Protein: 7 g/dL (ref 6.0–8.5)
eGFR: 90 mL/min/{1.73_m2} (ref >59)

## 2022-11-16 LAB — HEPATIC FUNCTION PANEL: Bilirubin, Direct: 0.14 mg/dL (ref 0.00–0.40)

## 2022-11-16 LAB — LIPID PANEL
Chol/HDL Ratio: 3.3 ratio (ref 0.0–5.0)
Cholesterol, Total: 129 mg/dL (ref 100–199)
HDL: 39 mg/dL — ABNORMAL LOW (ref >39)
LDL Chol Calc (NIH): 73 mg/dL (ref 0–99)
Triglycerides: 88 mg/dL (ref 0–149)
VLDL Cholesterol Cal: 17 mg/dL (ref 5–40)

## 2022-11-16 NOTE — Telephone Encounter (Signed)
Spoke with the patient who has a couple of question about his last CT. He wants to know if he has an aneurysm and he has some general questions about his condition.

## 2022-11-16 NOTE — Telephone Encounter (Signed)
Patient has some questions he would like to ask regarding the last CT that he took. Please call back to discuss

## 2022-12-12 ENCOUNTER — Telehealth: Payer: Self-pay

## 2022-12-12 ENCOUNTER — Telehealth: Payer: Self-pay | Admitting: Cardiovascular Disease

## 2022-12-12 NOTE — Telephone Encounter (Signed)
Contacted Robert [name] to schedule their annual wellness visit. Appointment made for 12/14/22.  Norton Blizzard, [place] ([facility])  [facility] [phone]

## 2022-12-12 NOTE — Telephone Encounter (Signed)
Pt states he is establishing with a new dentist and states he needs a letter from Dr. Burt Knack stating if he needs to premedicate before cleanings or not.

## 2022-12-14 ENCOUNTER — Ambulatory Visit: Payer: Medicare HMO

## 2022-12-14 ENCOUNTER — Encounter: Payer: Self-pay | Admitting: Cardiovascular Disease

## 2022-12-14 NOTE — Telephone Encounter (Signed)
Attempted to reach patient, left voicemail informing him that his requested letter would be sent to his MyChart account and to call back if any questions or concerns.   Letter sent stating below information:  Marylu Lund., NP  You2 days ago   I agree, he does not have a cardiac indication for premedication.  It appears that his dentist prescribed amoxicillin in the past.  He will need to follow-up with his dentist for premedication prescription.  Ambrose Pancoast, NP   You  Barbarann Ehlers Junius Creamer., NP2 days ago

## 2022-12-15 ENCOUNTER — Ambulatory Visit (HOSPITAL_BASED_OUTPATIENT_CLINIC_OR_DEPARTMENT_OTHER): Payer: Medicare HMO

## 2022-12-16 ENCOUNTER — Other Ambulatory Visit (HOSPITAL_BASED_OUTPATIENT_CLINIC_OR_DEPARTMENT_OTHER): Payer: Medicare HMO

## 2022-12-20 ENCOUNTER — Ambulatory Visit (HOSPITAL_BASED_OUTPATIENT_CLINIC_OR_DEPARTMENT_OTHER)
Admission: RE | Admit: 2022-12-20 | Discharge: 2022-12-20 | Disposition: A | Discharge status: Home / Self Care | Payer: Medicare HMO | Source: Ambulatory Visit | Attending: Nurse Practitioner | Admitting: Nurse Practitioner

## 2022-12-20 DIAGNOSIS — I77810 Thoracic aortic ectasia: Secondary | ICD-10-CM

## 2022-12-20 DIAGNOSIS — I700 Atherosclerosis of aorta: Secondary | ICD-10-CM | POA: Diagnosis not present

## 2022-12-20 DIAGNOSIS — I10 Essential (primary) hypertension: Secondary | ICD-10-CM | POA: Diagnosis not present

## 2022-12-20 DIAGNOSIS — I25119 Atherosclerotic heart disease of native coronary artery with unspecified angina pectoris: Secondary | ICD-10-CM | POA: Insufficient documentation

## 2022-12-20 DIAGNOSIS — E785 Hyperlipidemia, unspecified: Secondary | ICD-10-CM | POA: Insufficient documentation

## 2022-12-20 LAB — POCT I-STAT CREATININE: Creatinine, Ser: 1.1 mg/dL (ref 0.61–1.24)

## 2022-12-20 MED ORDER — IOHEXOL 350 MG/ML SOLN
100.0000 mL | Freq: Once | INTRAVENOUS | Status: AC | PRN
  Administered 2022-12-20: 75 mL via INTRAVENOUS

## 2023-02-18 ENCOUNTER — Other Ambulatory Visit: Payer: Self-pay | Admitting: Internal Medicine

## 2023-02-20 ENCOUNTER — Other Ambulatory Visit: Payer: Self-pay

## 2023-02-21 ENCOUNTER — Other Ambulatory Visit: Payer: Self-pay | Admitting: Internal Medicine

## 2023-03-22 ENCOUNTER — Other Ambulatory Visit: Payer: Self-pay | Admitting: Internal Medicine

## 2023-04-10 ENCOUNTER — Ambulatory Visit: Payer: Medicare HMO | Admitting: Cardiovascular Disease

## 2023-04-10 ENCOUNTER — Encounter: Payer: Self-pay | Admitting: Cardiovascular Disease

## 2023-04-10 VITALS — BP 120/74 | HR 58 | Ht 72.0 in | Wt 239.8 lb

## 2023-04-10 DIAGNOSIS — I10 Essential (primary) hypertension: Secondary | ICD-10-CM

## 2023-04-10 DIAGNOSIS — E785 Hyperlipidemia, unspecified: Secondary | ICD-10-CM

## 2023-04-10 DIAGNOSIS — I25119 Atherosclerotic heart disease of native coronary artery with unspecified angina pectoris: Secondary | ICD-10-CM | POA: Diagnosis not present

## 2023-04-10 DIAGNOSIS — I77810 Thoracic aortic ectasia: Secondary | ICD-10-CM | POA: Diagnosis not present

## 2023-04-10 MED ORDER — ROSUVASTATIN CALCIUM 40 MG PO TABS: 40.0000 mg | ORAL_TABLET | Freq: Every day | ORAL | 3 refills | Status: DC

## 2023-04-10 NOTE — Progress Notes (Signed)
Cardiology Office Note:    Date:  04/10/2023   ID:  JADER DESAI, DOB [birth year], MRN [id number]  PCP:  Corwin Levins, MD   [facility] Providers Cardiologist:  Tonny Bollman, MD     Referring MD: Corwin Levins, MD   Chief Complaint  Patient presents with   Coronary Artery Disease    History of Present Illness:    Robert [name] is a 77 y.o. male with a hx of: Coronary artery disease  S/p CABG in 1989 S/p multiple PCIs since CABG Cath 9/18: OM2 stent patent, S-RCA stent patent, L-LAD patent, S-Dx 61 (old), EF 50-55 NSTEMI 08/2020 s/p DES to pLCx Residual at cath: L-LAD patent, S-PDA patent with 50 ISR, mLCx stent patent Hypertension  Hyperlipidemia  PVCs GERD Ascending aorta and SoV (42 mm) dilation (Echocardiogram 12/21)  The patient is here alone today.  He is doing pretty well and reports no cardiovascular symptoms.  He specifically denies exertional chest pain, chest pressure, or shortness of breath.  He has no leg edema, orthopnea, PND, or heart palpitations.  He reports no recent change in his medications.  His wife had a stroke last year and continues to have difficulty with mobility.    Past Medical History:  Diagnosis Date   ALLERGIC RHINITIS 08/20/2007   Anemia    Aortic atherosclerosis (HCC)    CT in 08/2021   Arthritis    "back; knees; shoulders"  (06/08/2017)   Asthma    "very slight"    Barrett esophagus    BENIGN PROSTATIC HYPERTROPHY 08/20/2007   Coronary artery disease involving native heart with angina pectoris (HCC)    a. s/p CABG x 3 in 1989;  b. MI in 1995 with BMS to VG->RCA;  c. 07/2000 PCI/BMS to native LCX;  d. Known CTO of VG->Diag - multiple caths since then. e. PCI/DES to LCx 08/2020.   ELBOW PAIN, LEFT 04/15/2008   ERECTILE DYSFUNCTION 08/20/2007   Gallbladder polyp 08/27/2015   Noted on [place] Abd   GERD (gastroesophageal reflux disease)    Hepatic cyst 09/06/2015   Large simple right hepatic lobe, noted on [place] Abd    History of cardiomegaly 05/15/2013   Noted on CXR   History of meningitis    2nd grade, 8th grade, 9th grade, age 89   Hyperlipidemia LDL goal <70    HYPERTENSION    "been on RX since CABG but never a problem" (06/08/2017)   INSOMNIA-SLEEP DISORDER-UNSPEC 03/17/2008   Microcytosis    Mild dilation of ascending aorta (HCC)    Echo 12/22: EF 55-60, no RWMA, GR 1 DD, normal RVSF, severe LAE, mild RAE, mild MR aortic root 41, ascending aorta 42 // Chest/Aorta CTA 12/22: Ascending Aorta 41 mm; aortic atherosclerosis   Myocardial infarction (HCC) 02/1994   OSA on CPAP    PREMATURE VENTRICULAR CONTRACTIONS 08/20/2007   SCROTAL MASS 10/09/2008   URINARY INCONTINENCE, MALE 10/09/2008   UTI 04/24/2009   "just this once" (05/15/2013)    Past Surgical History:  Procedure Laterality Date   ANKLE FRACTURE SURGERY Right 1984   BACK SURGERY     CARDIAC CATHETERIZATION  X 2   CARDIAC CATHETERIZATION N/A 04/01/2016   Procedure: Left Heart Cath and Cors/Grafts Angiography;  Surgeon: Marykay Lex, MD;  Location: [facility] INVASIVE CV LAB;  Service: Cardiovascular;  Laterality: N/A;   CARDIAC CATHETERIZATION N/A 04/01/2016   Procedure: Intravascular Pressure Wire/FFR Study;  Surgeon: Marykay Lex, MD;  Location: [facility] INVASIVE CV  LAB;  Service: Cardiovascular;  Laterality: N/A;   CARDIAC CATHETERIZATION N/A 04/01/2016   Procedure: Coronary Stent Intervention;  Surgeon: Marykay Lex, MD;  Location: [facility] INVASIVE CV LAB;  Service: Cardiovascular;  Laterality: N/A;   CARDIAC CATHETERIZATION  06/08/2017   COLONOSCOPY  08/31/2011   COLONOSCOPY W/ BIOPSIES AND POLYPECTOMY  11/20/2003   adenomatous polyp, internal and external hemorrhoids   CORONARY ANGIOPLASTY  95, 98, 2000, 2001   CORONARY ANGIOPLASTY WITH STENT PLACEMENT  1995- 04/01/2016   "total of 4 stents counting today" (04/01/2016)   CORONARY ARTERY BYPASS GRAFT  1989   triple bypass MUSC   CYSTOSCOPY  08/07/2009   and right hydrocelectomy   FOOT TENDON  SURGERY Left 2012   "donor tendon"    HEMI-MICRODISCECTOMY LUMBAR LAMINECTOMY LEVEL 1 Right 09/18/2013   Procedure: CENTRAL DECOMPRESSION @ L4-5 FOR SPINAL STENOSIS. EXCISION OF SYNOVIAL CYST @ L4-5 RIGHT, MICRODISCECTOMY @L4 -5 RIGHT ;  Surgeon: Jacki Cones, MD;  Location: [facility] ORS;  Service: Orthopedics;  Laterality: Right;   INGUINAL HERNIA REPAIR Bilateral 1970's   "2 on one side; 1 on the other"    KNEE ARTHROSCOPY WITH MEDIAL MENISECTOMY Left 09/18/2018   Procedure: DIAGNOSTIC KNEE ARTHROSCOPY WITH MEDIAL MENISECTOMY;  Surgeon: Ranee Gosselin, MD;  Location: [facility] ORS;  Service: Orthopedics;  Laterality: Left;    LEFT HEART CATH AND CORS/GRAFTS ANGIOGRAPHY N/A 06/08/2017   Procedure: LEFT HEART CATH AND CORS/GRAFTS ANGIOGRAPHY;  Surgeon: Runell Gess, MD;  Location: [facility] INVASIVE CV LAB;  Service: Cardiovascular;  Laterality: N/A;   LEFT HEART CATH AND CORS/GRAFTS ANGIOGRAPHY N/A 08/28/2020   Procedure: LEFT HEART CATH AND CORS/GRAFTS ANGIOGRAPHY;  Surgeon: Tonny Bollman, MD;  Location: [facility] INVASIVE CV LAB;  Service: Cardiovascular;  Laterality: N/A;   LEFT HEART CATHETERIZATION WITH CORONARY ANGIOGRAM Bilateral 05/15/2013   Procedure: LEFT HEART CATHETERIZATION WITH CORONARY ANGIOGRAM;  Surgeon: Iran Ouch, MD;  Location: [facility] CATH LAB;  Service: Cardiovascular;  Laterality: Bilateral;   SHOULDER OPEN ROTATOR CUFF REPAIR Right 2009   Gioffre   SURGERY SCROTAL / TESTICULAR Right 2012   "removed the sac" (05/15/2013)   TONSILLECTOMY AND ADENOIDECTOMY  ~1961   TOTAL KNEE ARTHROPLASTY Left 07/01/2019   Procedure: LEFT TOTAL KNEE ARTHROPLASTY;  Surgeon: Ollen Gross, MD;  Location: [facility] ORS;  Service: Orthopedics;  Laterality: Left;    TRANSURETHRAL RESECTION OF PROSTATE  10/02/2009   Gyrus transurethral resection    UPPER GI ENDOSCOPY  09/06/2011    Current Medications: Current Meds  Medication Sig   albuterol (VENTOLIN HFA) 108 (90 Base) MCG/ACT inhaler Inhale 1-2  puffs into the lungs every 6 (six) hours as needed for wheezing or shortness of breath.   amLODipine (NORVASC) 5 MG tablet Take 1 tablet (5 mg total) by mouth daily.   amoxicillin (AMOXIL) 500 MG tablet Take 2,000 mg by mouth as directed. For dental procedures   atenolol (TENORMIN) 25 MG tablet TAKE 1 TABLET (25 MG TOTAL) BY MOUTH AT BEDTIME.   clopidogrel (PLAVIX) 75 MG tablet Take 1 tablet (75 mg total) by mouth daily.   ezetimibe (ZETIA) 10 MG tablet Take 10 mg by mouth daily.   isosorbide mononitrate (IMDUR) 30 MG 24 hr tablet Take 1 tablet (30 mg total) by mouth 2 (two) times daily.   lisinopril (ZESTRIL) 10 MG tablet TAKE 1 TABLET EVERY DAY   nitroGLYCERIN (NITROSTAT) 0.4 MG SL tablet PLACE 1 TABLET (0.4 MG TOTAL) UNDER THE TONGUE EVERY 5 (FIVE) MINUTES AS NEEDED FOR CHEST PAIN.  omeprazole (PRILOSEC) 20 MG capsule TAKE 1 CAPSULE EVERY DAY   sennosides-docusate sodium (SENOKOT-S) 8.6-50 MG tablet Take 1 tablet by mouth at bedtime.   simvastatin (ZOCOR) 40 MG tablet Take 1 tablet (40 mg total) by mouth at bedtime.   sulindac (CLINORIL) 200 MG tablet Take 200 mg by mouth 2 (two) times daily.   temazepam (RESTORIL) 30 MG capsule TAKE 1 CAPSULE BY MOUTH AT BEDTIME AS NEEDED   [DISCONTINUED] benzonatate (TESSALON) 200 MG capsule Take 1 capsule (200 mg total) by mouth 2 (two) times daily as needed for cough.   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 [employer]    Comment: part time golf course  Tobacco Use   Smoking status: Former    Current packs/day: 0.00    Average packs/day: 1 pack/day for 45.0 years  (45.0 ttl pk-yrs)    Types: Cigarettes    Start date: 04/23/1949    Quit date: 04/23/1994    Years since quitting: 28.9   Smokeless tobacco: Never  Vaping Use   Vaping status: Never Used  Substance and Sexual Activity   Alcohol use: Not Currently    Comment:  "I've  quit drinking in 1989"   Drug use: No   Sexual activity: Yes  Other Topics Concern   Not on file  Social History Narrative   Not on file   Social Determinants of Health   Financial Resource Strain: Not on file  Food Insecurity: Not on file  Transportation Needs: Not on file  Physical Activity: Not on file  Stress: Not on file  Social Connections: Not on file     Family History: The patient's family history includes Coronary artery disease in his father and mother; Esophageal cancer in his brother; Heart disease in his brother.  ROS:   Please see the history of present illness.    All other systems reviewed and are negative.  EKGs/Labs/Other Studies Reviewed:    The following studies were reviewed today: EKG Interpretation Date/Time:  Monday April 10 2023 08:19:04 EDT Ventricular Rate:  58 PR Interval:  226 QRS Duration:  86 QT Interval:  426 QTC Calculation: 418 R Axis:   52  Text Interpretation: Sinus bradycardia with 1st degree A-V block When compared with ECG of 29-Aug-2020 05:47, No significant change was found Confirmed by Tonny Bollman [phone]) on 04/10/2023 8:35:12 AM    Recent Labs: 11/15/2022: ALT 14; BUN 16; Potassium 4.9; Sodium 142 12/20/2022: Creatinine, Ser 1.10  Recent Lipid Panel    Component Value Date/Time   CHOL 129 11/15/2022 0724   TRIG 88 11/15/2022 0724   HDL 39 (L) 11/15/2022 0724   CHOLHDL 3.3 11/15/2022 0724   CHOLHDL 4 12/08/2020 1331   VLDL 42.0 (H) 12/08/2020 1331   LDLCALC 73 11/15/2022 0724   LDLDIRECT 71.0 12/08/2020 1331     Risk Assessment/Calculations:                Physical Exam:    VS:  BP 120/74   Pulse (!) 58   Ht 6' (1.829 m)   Wt 239  lb 12.8 oz  (108.8 kg)   SpO2 96%   BMI 32.52 kg/m     Wt Readings from Last 3 Encounters:  04/10/23 239 lb 12.8 oz (108.8 kg)  11/10/22 231 lb (104.8 kg)  07/19/22 235 lb 6 oz (106.8 kg)     GEN:  Well nourished, well developed in no acute distress HEENT: Normal NECK: No JVD; No carotid bruits LYMPHATICS: No lymphadenopathy CARDIAC: RRR, no murmurs, rubs, gallops RESPIRATORY:  Clear to auscultation without rales, wheezing or rhonchi  ABDOMEN: Soft, non-tender, non-distended MUSCULOSKELETAL:  No edema; No deformity  SKIN: Warm and dry NEUROLOGIC:  Alert and oriented x 3 PSYCHIATRIC:  Normal affect   ASSESSMENT:    1. Essential hypertension   2. Hyperlipidemia LDL goal <70   3. Coronary artery disease involving native coronary artery of native heart with angina pectoris (HCC)   4. Mild dilation of ascending aorta (HCC)    PLAN:    In order of problems listed above:  Well-controlled on amlodipine, atenolol, and lisinopril.  Continue same therapy. LDL cholesterol is 73.  Patient is treated with ezetimibe and simvastatin 40 mg.  He is also followed through the [place] system.  I think we should get him on high intensity statin drug, especially with the simvastatin/calcium channel blocker interaction.  Will discontinue simvastatin and start him on rosuvastatin 40 mg daily.  He will have follow-up lipids and LFTs in 3 to 4 months either here or through the [place] system. We reviewed his cardiac cath films from August 28, 2020.  At that time he underwent stenting of severe stenosis in the proximal circumflex.  He has done well since that time and has no angina on a combination of amlodipine, isosorbide, and atenolol.  Continue same therapy.  Continue antiplatelet therapy with clopidogrel. CTA study from April 2024 reviewed with maximum diameter of the ascending aorta 4.1 cm.     Medication Adjustments/Labs and Tests Ordered: Current medicines are reviewed at length with the patient today.  Concerns  regarding medicines are outlined above.  Orders Placed This Encounter  Procedures   EKG 12-Lead   No orders of the defined types were placed in this encounter.   There are no Patient Instructions on file for this visit.   Signed, Tonny Bollman, MD  04/10/2023 8:40 AM    [facility]

## 2023-04-10 NOTE — Patient Instructions (Signed)
Medication Instructions:  STOP Simvastatin/Zocor START Rosuvastatin/Crestor 40mg  daily *If you need a refill on your cardiac medications before your next appointment, please call your pharmacy*   Lab Work: NONE (should have repeat labs in 3 months) If you have labs (blood work) drawn today and your tests are completely normal, you will receive your results only by: MyChart Message (if you have MyChart) OR A paper copy in the mail If you have any lab test that is abnormal or we need to change your treatment, we will call you to review the results.   Testing/Procedures: NONE   Follow-Up: At [facility], you and your health needs are our priority.  As part of our continuing mission to provide you with exceptional heart care, we have created designated Provider Care Teams.  These Care Teams include your primary Cardiologist (physician) and Advanced Practice Providers (APPs -  Physician Assistants and Nurse Practitioners) who all work together to provide you with the care you need, when you need it.  We recommend signing up for the patient portal called "MyChart".  Sign up information is provided on this After Visit Summary.  MyChart is used to connect with patients for Virtual Visits (Telemedicine).  Patients are able to view lab/test results, encounter notes, upcoming appointments, etc.  Non-urgent messages can be sent to your provider as well.   To learn more about what you can do with MyChart, go to [email or web address].    Your next appointment:   1 year(s)  Provider:   Tonny Bollman, MD

## 2023-04-24 ENCOUNTER — Other Ambulatory Visit: Payer: Self-pay | Admitting: Internal Medicine

## 2023-04-24 DIAGNOSIS — I2510 Atherosclerotic heart disease of native coronary artery without angina pectoris: Secondary | ICD-10-CM

## 2023-04-24 MED ORDER — TEMAZEPAM 30 MG PO CAPS: 30.0000 mg | ORAL_CAPSULE | Freq: Every evening | ORAL | 0 refills | Status: DC | PRN

## 2023-05-01 ENCOUNTER — Telehealth: Payer: Self-pay | Admitting: Internal Medicine

## 2023-05-01 ENCOUNTER — Telehealth: Payer: Self-pay | Admitting: Cardiovascular Disease

## 2023-05-01 ENCOUNTER — Encounter: Payer: Medicare HMO | Admitting: Internal Medicine

## 2023-05-01 NOTE — Telephone Encounter (Signed)
Amber from [employer] is calling in regards to the patient having clearance on an upcoming procedure. Amber stated we requested additional information and she would like to verify we received it. Please advise.

## 2023-05-01 NOTE — Telephone Encounter (Signed)
I s/w with [facility] at [facility]. I stated that I did not see a clearance request come over. She stated she faxed one about 1 week ago. I stated I was on vacation and I am not not sure what happened , but if could re-fax I will be sure to get it to the pre op APP. Amber thanked me for the help and will re-fax.

## 2023-05-01 NOTE — Telephone Encounter (Signed)
Prescription Request  05/01/2023  LOV: Visit date not found  What is the name of the medication or equipment?  amLODipine (NORVASC) 5 MG tablet  rosuvastatin (CRESTOR) 40 MG tablet  isosorbide mononitrate (IMDUR) 30 MG 24 hr tablet  clopidogrel (PLAVIX) 75 MG tablet  sulindac (CLINORIL) 200 MG tablet   Have you contacted your pharmacy to request a refill? No   Which pharmacy would you like this sent to?    [facility] - [place], [place] - [address] [place] [address] Phone: [phone] Fax: [phone]  Patient notified that their request is being sent to the clinical staff for review and that they should receive a response within 2 business days.   Please advise at Mobile [phone] (mobile)

## 2023-05-02 NOTE — Telephone Encounter (Signed)
Pt is due for appt. Last saw MD 11/2021 need ov for refills.Marland KitchenRaechel Chute

## 2023-05-03 ENCOUNTER — Other Ambulatory Visit: Payer: Self-pay

## 2023-05-03 MED ORDER — SULINDAC 200 MG PO TABS: 200.0000 mg | ORAL_TABLET | Freq: Two times a day (BID) | ORAL | 0 refills | Status: DC

## 2023-05-03 MED ORDER — SENNA-DOCUSATE SODIUM 8.6-50 MG PO TABS: 1.0000 | ORAL_TABLET | Freq: Every day | ORAL | 0 refills | Status: DC

## 2023-05-03 MED ORDER — CLOPIDOGREL BISULFATE 75 MG PO TABS: 75.0000 mg | ORAL_TABLET | Freq: Every day | ORAL | 3 refills | Status: DC

## 2023-05-03 MED ORDER — AMLODIPINE BESYLATE 5 MG PO TABS: 5.0000 mg | ORAL_TABLET | Freq: Every day | ORAL | 3 refills | Status: DC

## 2023-05-03 MED ORDER — ISOSORBIDE MONONITRATE ER 30 MG PO TB24: 30.0000 mg | ORAL_TABLET | Freq: Two times a day (BID) | ORAL | 3 refills | Status: DC

## 2023-05-03 NOTE — Telephone Encounter (Signed)
   Pre-operative Risk Assessment    Patient Name: Robert [name]  DOB: [birth year] MRN: [id number]     Request for Surgical Clearance    Procedure:  Dental Extraction - Amount of Teeth to be Pulled:  Extraction of 2 teeth,2 implants to be placed, 2 bridges and 2 crowns and fillings  Date of Surgery:  Clearance TBD                                 Surgeon:  Not Listed Surgeon's Group or Practice Name:  [facility] Phone number:  [phone] Fax number:  [phone]   Type of Clearance Requested:   - Medical  - Pharmacy:  Hold Clopidogrel (Plavix)     Type of Anesthesia:  Local    Additional requests/questions:    [name]   05/03/2023, 9:23 AM

## 2023-05-03 NOTE — Telephone Encounter (Signed)
Pt at low cardiac risk of dental procedure - ok to proceed. OK to hold plavix x 5 days if needed. thx

## 2023-05-03 NOTE — Telephone Encounter (Signed)
   Patient Name: Robert [name]  DOB: 1946/06/24 MRN: [id number]  Primary Cardiologist: Tonny Bollman, MD  Chart reviewed as part of pre-operative protocol coverage. Pre-op clearance already addressed by colleagues in earlier phone notes. To summarize recommendations:  - Pt at low cardiac risk of dental procedure - ok to proceed. OK to hold plavix x 5 days if needed. thx  -Dr. Excell Seltzer  Will route this bundled recommendation to requesting provider via Epic fax function and remove from pre-op pool. Please call with questions.  Sharlene Dory, PA-C 05/03/2023, 3:56 PM

## 2023-05-04 ENCOUNTER — Ambulatory Visit: Payer: Medicare HMO | Admitting: Internal Medicine

## 2023-05-04 ENCOUNTER — Encounter: Payer: Self-pay | Admitting: Internal Medicine

## 2023-05-04 VITALS — BP 124/82 | HR 55 | Temp 98.1°F | Ht 72.0 in | Wt 243.0 lb

## 2023-05-04 DIAGNOSIS — J309 Allergic rhinitis, unspecified: Secondary | ICD-10-CM

## 2023-05-04 DIAGNOSIS — R7302 Impaired glucose tolerance (oral): Secondary | ICD-10-CM

## 2023-05-04 DIAGNOSIS — E559 Vitamin D deficiency, unspecified: Secondary | ICD-10-CM | POA: Diagnosis not present

## 2023-05-04 DIAGNOSIS — E538 Deficiency of other specified B group vitamins: Secondary | ICD-10-CM | POA: Diagnosis not present

## 2023-05-04 DIAGNOSIS — E783 Hyperchylomicronemia: Secondary | ICD-10-CM | POA: Diagnosis not present

## 2023-05-04 DIAGNOSIS — Z0000 Encounter for general adult medical examination without abnormal findings: Secondary | ICD-10-CM | POA: Diagnosis not present

## 2023-05-04 DIAGNOSIS — I10 Essential (primary) hypertension: Secondary | ICD-10-CM

## 2023-05-04 DIAGNOSIS — I252 Old myocardial infarction: Secondary | ICD-10-CM | POA: Insufficient documentation

## 2023-05-04 DIAGNOSIS — I2510 Atherosclerotic heart disease of native coronary artery without angina pectoris: Secondary | ICD-10-CM | POA: Diagnosis not present

## 2023-05-04 DIAGNOSIS — Z125 Encounter for screening for malignant neoplasm of prostate: Secondary | ICD-10-CM | POA: Diagnosis not present

## 2023-05-04 DIAGNOSIS — H903 Sensorineural hearing loss, bilateral: Secondary | ICD-10-CM | POA: Insufficient documentation

## 2023-05-04 DIAGNOSIS — Z0001 Encounter for general adult medical examination with abnormal findings: Secondary | ICD-10-CM

## 2023-05-04 DIAGNOSIS — Z461 Encounter for fitting and adjustment of hearing aid: Secondary | ICD-10-CM | POA: Insufficient documentation

## 2023-05-04 LAB — CBC WITH DIFFERENTIAL/PLATELET
Basophils Absolute: 0.1 10*3/uL (ref 0.0–0.1)
Basophils Relative: 1 % (ref 0.0–3.0)
Eosinophils Absolute: 0.3 10*3/uL (ref 0.0–0.7)
Eosinophils Relative: 3.7 % (ref 0.0–5.0)
HCT: 45.6 % (ref 39.0–52.0)
Hemoglobin: 14.5 g/dL (ref 13.0–17.0)
Lymphocytes Relative: 29.8 % (ref 12.0–46.0)
Lymphs Abs: 2 10*3/uL (ref 0.7–4.0)
MCHC: 31.7 g/dL (ref 30.0–36.0)
MCV: 80.1 fl (ref 78.0–100.0)
Monocytes Absolute: 0.8 10*3/uL (ref 0.1–1.0)
Monocytes Relative: 11.2 % (ref 3.0–12.0)
Neutro Abs: 3.7 10*3/uL (ref 1.4–7.7)
Neutrophils Relative %: 54.3 % (ref 43.0–77.0)
Platelets: 215 10*3/uL (ref 150.0–400.0)
RBC: 5.7 Mil/uL (ref 4.22–5.81)
RDW: 15.4 % (ref 11.5–15.5)
WBC: 6.7 10*3/uL (ref 4.0–10.5)

## 2023-05-04 LAB — HEPATIC FUNCTION PANEL
ALT: 14 U/L (ref 0–53)
AST: 15 U/L (ref 0–37)
Albumin: 4.2 g/dL (ref 3.5–5.2)
Alkaline Phosphatase: 74 U/L (ref 39–117)
Bilirubin, Direct: 0.2 mg/dL (ref 0.0–0.3)
Total Bilirubin: 0.7 mg/dL (ref 0.2–1.2)
Total Protein: 6.8 g/dL (ref 6.0–8.3)

## 2023-05-04 LAB — VITAMIN B12: Vitamin B-12: 165 pg/mL — ABNORMAL LOW (ref 211–911)

## 2023-05-04 LAB — TSH: TSH: 1.22 u[IU]/mL (ref 0.35–5.50)

## 2023-05-04 LAB — URINALYSIS, ROUTINE W REFLEX MICROSCOPIC
Bilirubin Urine: NEGATIVE
Ketones, ur: NEGATIVE
Leukocytes,Ua: NEGATIVE
Nitrite: NEGATIVE
Specific Gravity, Urine: 1.015 (ref 1.000–1.030)
Total Protein, Urine: NEGATIVE
Urine Glucose: NEGATIVE
Urobilinogen, UA: 2 — AB (ref 0.0–1.0)
pH: 6 (ref 5.0–8.0)

## 2023-05-04 LAB — BASIC METABOLIC PANEL
BUN: 23 mg/dL (ref 6–23)
CO2: 26 mEq/L (ref 19–32)
Calcium: 9.1 mg/dL (ref 8.4–10.5)
Chloride: 102 mEq/L (ref 96–112)
Creatinine, Ser: 0.95 mg/dL (ref 0.40–1.50)
GFR: 77.63 mL/min (ref >60.00)
Glucose, Bld: 86 mg/dL (ref 70–99)
Potassium: 4.5 mEq/L (ref 3.5–5.1)
Sodium: 136 mEq/L (ref 135–145)

## 2023-05-04 LAB — HEMOGLOBIN A1C: Hgb A1c MFr Bld: 6 % (ref 4.6–6.5)

## 2023-05-04 LAB — MICROALBUMIN / CREATININE URINE RATIO
Creatinine,U: 88.7 mg/dL
Microalb Creat Ratio: 0.8 mg/g (ref 0.0–30.0)
Microalb, Ur: <0.7 mg/dL (ref 0.0–1.9)

## 2023-05-04 LAB — LIPID PANEL
Cholesterol: 117 mg/dL (ref 0–200)
HDL: 33.4 mg/dL — ABNORMAL LOW (ref >39.00)
LDL Cholesterol: 64 mg/dL (ref 0–99)
NonHDL: 83.35
Total CHOL/HDL Ratio: 3
Triglycerides: 96 mg/dL (ref 0.0–149.0)
VLDL: 19.2 mg/dL (ref 0.0–40.0)

## 2023-05-04 LAB — PSA: PSA: 0.94 ng/mL (ref 0.10–4.00)

## 2023-05-04 LAB — VITAMIN D 25 HYDROXY (VIT D DEFICIENCY, FRACTURES): VITD: 43.85 ng/mL (ref 30.00–100.00)

## 2023-05-04 MED ORDER — AMLODIPINE BESYLATE 5 MG PO TABS: 5.0000 mg | ORAL_TABLET | Freq: Every day | ORAL | 3 refills | Status: DC

## 2023-05-04 MED ORDER — ATENOLOL 25 MG PO TABS: 25.0000 mg | ORAL_TABLET | Freq: Every day | ORAL | 3 refills | Status: DC

## 2023-05-04 MED ORDER — ROSUVASTATIN CALCIUM 40 MG PO TABS: 40.0000 mg | ORAL_TABLET | Freq: Every day | ORAL | 3 refills | Status: DC

## 2023-05-04 MED ORDER — NITROGLYCERIN 0.4 MG SL SUBL
0.4000 mg | SUBLINGUAL_TABLET | SUBLINGUAL | 11 refills | Status: DC | PRN
Start: 2023-05-04 — End: 2024-05-30

## 2023-05-04 MED ORDER — EZETIMIBE 10 MG PO TABS: 10.0000 mg | ORAL_TABLET | Freq: Every day | ORAL | 3 refills | Status: DC

## 2023-05-04 MED ORDER — LISINOPRIL 10 MG PO TABS: 10.0000 mg | ORAL_TABLET | Freq: Every day | ORAL | 3 refills | Status: DC

## 2023-05-04 MED ORDER — ISOSORBIDE MONONITRATE ER 30 MG PO TB24: 30.0000 mg | ORAL_TABLET | Freq: Two times a day (BID) | ORAL | 3 refills | Status: DC

## 2023-05-04 MED ORDER — OMEPRAZOLE 20 MG PO CPDR: 20.0000 mg | DELAYED_RELEASE_CAPSULE | Freq: Every day | ORAL | 3 refills | Status: DC

## 2023-05-04 NOTE — Progress Notes (Signed)
Patient ID: Robert [name], male   DOB: Oct 01, 1945, 77 y.o.   MRN: [id number]         Chief Complaint:: wellness exam and hld, htn, hyperglycemia,        HPI:  ABDOULAYE [name] is a 77 y.o. male here for wellness exam; decliens covid booster, for flu shot at pharmacy, declines colonoscopy, o/w up to date                        Also Pt denies chest pain, increased sob or doe, wheezing, orthopnea, PND, increased LE swelling, palpitations, dizziness or syncope.  Pt denies polydipsia, polyuria, or new focal neuro s/s.    Pt denies fever, wt loss, night sweats, loss of appetite, or other constitutional symptoms Does have several wks ongoing nasal allergy symptoms with clearish congestion, itch and sneezing, without fever, pain, ST, cough, swelling or wheezing.    Wt Readings from Last 3 Encounters:  05/04/23 243 lb (110.2 kg)  04/10/23 239 lb 12.8 oz (108.8 kg)  11/10/22 231 lb (104.8 kg)   BP Readings from Last 3 Encounters:  05/04/23 124/82  04/10/23 120/74  11/10/22 117/68   Immunization History  Administered Date(s) Administered   Covid-19, Mrna,Vaccine(Spikevax)20yrs and older 07/21/2022   Fluad Quad(high Dose 65+) 08/13/2019, 06/19/2020   H1N1 10/09/2008, 10/22/2008   Influenza Whole 08/20/2007, 07/21/2008   Influenza, High Dose Seasonal PF 08/04/2015, 09/08/2016   Influenza, Seasonal, Injecte, Preservative Fre 10/28/2009, 10/13/2010, 11/16/2011, 05/31/2012, 08/08/2013, 07/17/2014   Influenza,inj,Quad PF,6+ Mos 10/04/2021   Influenza-Unspecified 06/21/2000, 06/19/2001, 06/19/2002, 08/20/2003, 07/20/2004, 07/20/2005, 06/19/2006, 07/21/2007, 08/12/2008, 07/20/2013, 08/04/2015, 09/19/2017, 10/04/2021   PFIZER Comirnaty(Gray Top)Covid-19 Tri-Sucrose Vaccine 04/09/2021   PFIZER(Purple Top)SARS-COV-2 Vaccination 11/10/2019, 12/02/2019, 06/29/2020   PNEUMOCOCCAL CONJUGATE-20 05/28/2021   Pfizer Covid-19 Vaccine Bivalent Booster 45yrs & up 07/01/2021, 07/20/2021   Pneumococcal  Conjugate-13 06/24/2014, 07/17/2014   Pneumococcal Polysaccharide-23 09/28/2000, 05/31/2012, 06/24/2013   Pneumococcal-Unspecified 05/20/2010   Rsv, Bivalent, Protein Subunit Rsvpref,pf Verdis Frederickson) 01/17/2023   Td 10/09/2008   Td (Adult), 2 Lf Tetanus Toxid, Preservative Free 10/09/2008   Tdap 11/16/2011, 05/20/2017   Zoster Recombinant(Shingrix) 11/14/2017, 05/15/2018   Zoster, Live 01/15/2014   Health Maintenance Due  Topic Date Due   Medicare Annual Wellness (AWV)  Never done   COVID-19 Vaccine (8 - 2023-24 season) 11/19/2022   INFLUENZA VACCINE  04/20/2023      Past Medical History:  Diagnosis Date   ALLERGIC RHINITIS 08/20/2007   Anemia    Aortic atherosclerosis (HCC)    CT in 08/2021   Arthritis    "back; knees; shoulders"  (06/08/2017)   Asthma    "very slight"    Barrett esophagus    BENIGN PROSTATIC HYPERTROPHY 08/20/2007   Coronary artery disease involving native heart with angina pectoris (HCC)    a. s/p CABG x 3 in 1989;  b. MI in 1995 with BMS to VG->RCA;  c. 07/2000 PCI/BMS to native LCX;  d. Known CTO of VG->Diag - multiple caths since then. e. PCI/DES to LCx 08/2020.   ELBOW PAIN, LEFT 04/15/2008   ERECTILE DYSFUNCTION 08/20/2007   Gallbladder polyp 08/27/2015   Noted on [place] Abd   GERD (gastroesophageal reflux disease)    Hepatic cyst 09/06/2015   Large simple right hepatic lobe, noted on [place] Abd   History of cardiomegaly 05/15/2013   Noted on CXR   History of meningitis    2nd grade, 8th grade, 9th grade, age 14   Hyperlipidemia LDL goal <70  HYPERTENSION    "been on RX since CABG but never a problem" (06/08/2017)   INSOMNIA-SLEEP DISORDER-UNSPEC 03/17/2008   Microcytosis    Mild dilation of ascending aorta (HCC)    Echo 12/22: EF 55-60, no RWMA, GR 1 DD, normal RVSF, severe LAE, mild RAE, mild MR aortic root 41, ascending aorta 42 // Chest/Aorta CTA 12/22: Ascending Aorta 41 mm; aortic atherosclerosis   Myocardial infarction (HCC) 02/1994   OSA on CPAP     PREMATURE VENTRICULAR CONTRACTIONS 08/20/2007   SCROTAL MASS 10/09/2008   URINARY INCONTINENCE, MALE 10/09/2008   UTI 04/24/2009   "just this once" (05/15/2013)   Past Surgical History:  Procedure Laterality Date   ANKLE FRACTURE SURGERY Right 1984   BACK SURGERY     CARDIAC CATHETERIZATION  X 2   CARDIAC CATHETERIZATION N/A 04/01/2016   Procedure: Left Heart Cath and Cors/Grafts Angiography;  Surgeon: Marykay Lex, MD;  Location: [facility] INVASIVE CV LAB;  Service: Cardiovascular;  Laterality: N/A;   CARDIAC CATHETERIZATION N/A 04/01/2016   Procedure: Intravascular Pressure Wire/FFR Study;  Surgeon: Marykay Lex, MD;  Location: [facility] INVASIVE CV LAB;  Service: Cardiovascular;  Laterality: N/A;   CARDIAC CATHETERIZATION N/A 04/01/2016   Procedure: Coronary Stent Intervention;  Surgeon: Marykay Lex, MD;  Location: [facility] INVASIVE CV LAB;  Service: Cardiovascular;  Laterality: N/A;   CARDIAC CATHETERIZATION  06/08/2017   COLONOSCOPY  08/31/2011   COLONOSCOPY W/ BIOPSIES AND POLYPECTOMY  11/20/2003   adenomatous polyp, internal and external hemorrhoids   CORONARY ANGIOPLASTY  95, 98, 2000, 2001   CORONARY ANGIOPLASTY WITH STENT PLACEMENT  1995- 04/01/2016   "total of 4 stents counting today" (04/01/2016)   CORONARY ARTERY BYPASS GRAFT  1989   triple bypass MUSC   CYSTOSCOPY  08/07/2009   and right hydrocelectomy   FOOT TENDON SURGERY Left 2012   "donor tendon"    HEMI-MICRODISCECTOMY LUMBAR LAMINECTOMY LEVEL 1 Right 09/18/2013   Procedure: CENTRAL DECOMPRESSION @ L4-5 FOR SPINAL STENOSIS. EXCISION OF SYNOVIAL CYST @ L4-5 RIGHT, MICRODISCECTOMY @L4 -5 RIGHT ;  Surgeon: Jacki Cones, MD;  Location: [facility] ORS;  Service: Orthopedics;  Laterality: Right;   INGUINAL HERNIA REPAIR Bilateral 1970's   "2 on one side; 1 on the other"    KNEE ARTHROSCOPY WITH MEDIAL MENISECTOMY Left 09/18/2018   Procedure: DIAGNOSTIC KNEE ARTHROSCOPY WITH MEDIAL MENISECTOMY;  Surgeon: Ranee Gosselin, MD;  Location: [facility]  ORS;  Service: Orthopedics;  Laterality: Left;    LEFT HEART CATH AND CORS/GRAFTS ANGIOGRAPHY N/A 06/08/2017   Procedure: LEFT HEART CATH AND CORS/GRAFTS ANGIOGRAPHY;  Surgeon: Runell Gess, MD;  Location: [facility] INVASIVE CV LAB;  Service: Cardiovascular;  Laterality: N/A;   LEFT HEART CATH AND CORS/GRAFTS ANGIOGRAPHY N/A 08/28/2020   Procedure: LEFT HEART CATH AND CORS/GRAFTS ANGIOGRAPHY;  Surgeon: Tonny Bollman, MD;  Location: [facility] INVASIVE CV LAB;  Service: Cardiovascular;  Laterality: N/A;   LEFT HEART CATHETERIZATION WITH CORONARY ANGIOGRAM Bilateral 05/15/2013   Procedure: LEFT HEART CATHETERIZATION WITH CORONARY ANGIOGRAM;  Surgeon: Iran Ouch, MD;  Location: [facility] CATH LAB;  Service: Cardiovascular;  Laterality: Bilateral;   SHOULDER OPEN ROTATOR CUFF REPAIR Right 2009   Gioffre   SURGERY SCROTAL / TESTICULAR Right 2012   "removed the sac" (05/15/2013)   TONSILLECTOMY AND ADENOIDECTOMY  ~1961   TOTAL KNEE ARTHROPLASTY Left 07/01/2019   Procedure: LEFT TOTAL KNEE ARTHROPLASTY;  Surgeon: Ollen Gross, MD;  Location: [facility] ORS;  Service: Orthopedics;  Laterality: Left;    TRANSURETHRAL RESECTION OF PROSTATE  10/02/2009   Gyrus transurethral resection    UPPER GI ENDOSCOPY  09/06/2011    reports that he quit smoking about 29 years ago. His smoking use included cigarettes. He started smoking about 74 years ago. He has a 45 pack-year smoking history. He has never used smokeless tobacco. He reports that he does not currently use alcohol. He reports that he does not use drugs. family history includes Coronary artery disease in his father and mother; Esophageal cancer in his brother; Heart disease in his brother. Allergies  Allergen Reactions   Metoprolol Other (See Comments)    severe bradycardia   Adhesive [Tape] Rash   Lipitor [Atorvastatin] Other (See Comments)    Worsening reflux   Current Outpatient Medications on File Prior to Visit  Medication Sig Dispense Refill    albuterol (VENTOLIN HFA) 108 (90 Base) MCG/ACT inhaler Inhale 1-2 puffs into the lungs every 6 (six) hours as needed for wheezing or shortness of breath.     amoxicillin (AMOXIL) 500 MG tablet Take 2,000 mg by mouth as directed. For dental procedures     clopidogrel (PLAVIX) 75 MG tablet Take 1 tablet (75 mg total) by mouth daily. 90 tablet 3   sennosides-docusate sodium (SENOKOT-S) 8.6-50 MG tablet Take 1 tablet by mouth at bedtime. 90 tablet 0   sulindac (CLINORIL) 200 MG tablet Take 1 tablet (200 mg total) by mouth 2 (two) times daily. 180 tablet 0   temazepam (RESTORIL) 30 MG capsule TAKE 1 CAPSULE BY MOUTH AT BEDTIME AS NEEDED 30 capsule 0   temazepam (RESTORIL) 30 MG capsule Take 1 capsule (30 mg total) by mouth at bedtime as needed for sleep. 30 capsule 0   No current facility-administered medications on file prior to visit.        ROS:  All others reviewed and negative.  Objective        PE:  BP 124/82 (BP Location: Right Arm, Patient Position: Sitting, Cuff Size: Normal)   Pulse (!) 55   Temp 98.1 F (36.7 C) (Oral)   Ht 6' (1.829 m)   Wt 243 lb (110.2 kg)   SpO2 97%   BMI 32.96 kg/m                 Constitutional: Pt appears in NAD               HENT: Head: NCAT.                Right Ear: External ear normal.                 Left Ear: External ear normal.                Eyes: . Pupils are equal, round, and reactive to light. Conjunctivae and EOM are normal               Nose: without d/c or deformity               Neck: Neck supple. Gross normal ROM               Cardiovascular: Normal rate and regular rhythm.                 Pulmonary/Chest: Effort normal and breath sounds without rales or wheezing.                Abd:  Soft, NT, ND, + BS, no organomegaly  Neurological: Pt is alert. At baseline orientation, motor grossly intact               Skin: Skin is warm. No rashes, no other new lesions, LE edema - none               Psychiatric: Pt behavior is normal  without agitation   Micro: none  Cardiac tracings I have personally interpreted today:  none  Pertinent Radiological findings (summarize): none   Lab Results  Component Value Date   WBC 6.7 05/04/2023   HGB 14.5 05/04/2023   HCT 45.6 05/04/2023   PLT 215.0 05/04/2023   GLUCOSE 86 05/04/2023   CHOL 117 05/04/2023   TRIG 96.0 05/04/2023   HDL 33.40 (L) 05/04/2023   LDLDIRECT 71.0 12/08/2020   LDLCALC 64 05/04/2023   ALT 14 05/04/2023   AST 15 05/04/2023   NA 136 05/04/2023   K 4.5 05/04/2023   CL 102 05/04/2023   CREATININE 0.95 05/04/2023   BUN 23 05/04/2023   CO2 26 05/04/2023   TSH 1.22 05/04/2023   PSA 0.94 05/04/2023   INR 1.1 06/24/2019   HGBA1C 6.0 05/04/2023   MICROALBUR <0.7 05/04/2023   Assessment/Plan:  JAIMY HABECKER is a 77 y.o. White or Caucasian [1] male with  has a past medical history of ALLERGIC RHINITIS (08/20/2007), Anemia, Aortic atherosclerosis (HCC), Arthritis, Asthma, Barrett esophagus, BENIGN PROSTATIC HYPERTROPHY (08/20/2007), Coronary artery disease involving native heart with angina pectoris (HCC), ELBOW PAIN, LEFT (04/15/2008), ERECTILE DYSFUNCTION (08/20/2007), Gallbladder polyp (08/27/2015), GERD (gastroesophageal reflux disease), Hepatic cyst (09/06/2015), History of cardiomegaly (05/15/2013), History of meningitis, Hyperlipidemia LDL goal <70, HYPERTENSION, INSOMNIA-SLEEP DISORDER-UNSPEC (03/17/2008), Microcytosis, Mild dilation of ascending aorta (HCC), Myocardial infarction (HCC) (02/1994), OSA on CPAP, PREMATURE VENTRICULAR CONTRACTIONS (08/20/2007), SCROTAL MASS (10/09/2008), URINARY INCONTINENCE, MALE (10/09/2008), and UTI (04/24/2009).  Encounter for well adult exam with abnormal findings Age and sex appropriate education and counseling updated with regular exercise and diet Referrals for preventative services - none needed Immunizations addressed - declines covid booster Smoking counseling  - none needed Evidence for depression or  other mood disorder - none significant Most recent labs reviewed. I have personally reviewed and have noted: 1) the patient's medical and social history 2) The patient's current medications and supplements 3) The patient's height, weight, and BMI have been recorded in the chart   HYPERLIPIDEMIA TYPE I / IV Lab Results  Component Value Date   LDLCALC 64 05/04/2023   Stable, pt to continue current statin zetia 10 mg every day, crestor 40 qd   Essential hypertension BP Readings from Last 3 Encounters:  05/04/23 124/82  04/10/23 120/74  11/10/22 117/68   Stable, pt to continue medical treatment norvasc 5 every day, tenormin 25 every day, lisinopril 10 qd   Impaired glucose tolerance Lab Results  Component Value Date   HGBA1C 6.0 05/04/2023   Stable, pt to continue current medical treatment  - diet, wt control   Allergic rhinitis Mild to mod, for otc nasacort asd,  to f/u any worsening symptoms or concerns  B12 deficiency Lab Results  Component Value Date   VITAMINB12 165 (L) 05/04/2023   Low, to start oral replacement - b12 1000 mcg qd  Followup: Return in about 6 months (around 11/04/2023).  Oliver Barre, MD 05/07/2023 7:53 PM [facility]

## 2023-05-04 NOTE — Patient Instructions (Signed)
Please continue all other medications as before, and refills have been done if requested.  Please have the pharmacy call with any other refills you may need.  Please continue your efforts at being more active, low cholesterol diet, and weight control.  You are otherwise up to date with prevention measures today.  Please keep your appointments with your specialists as you may have planned  Please go to the LAB at the blood drawing area for the tests to be done  You will be contacted by phone if any changes need to be made immediately.  Otherwise, you will receive a letter about your results with an explanation, but please check with MyChart first.  Please remember to sign up for MyChart if you have not done so, as this will be important to you in the future with finding out test results, communicating by private email, and scheduling acute appointments online when needed.  Please make an Appointment to return in 6 months, or sooner if needed 

## 2023-05-07 ENCOUNTER — Encounter: Payer: Self-pay | Admitting: Internal Medicine

## 2023-05-07 DIAGNOSIS — E538 Deficiency of other specified B group vitamins: Secondary | ICD-10-CM | POA: Insufficient documentation

## 2023-05-07 NOTE — Assessment & Plan Note (Signed)

## 2023-05-07 NOTE — Assessment & Plan Note (Signed)
Lab Results  Component Value Date   LDLCALC 64 05/04/2023   Stable, pt to continue current statin zetia 10 mg every day, crestor 40 qd

## 2023-05-07 NOTE — Assessment & Plan Note (Signed)
BP Readings from Last 3 Encounters:  05/04/23 124/82  04/10/23 120/74  11/10/22 117/68   Stable, pt to continue medical treatment norvasc 5 every day, tenormin 25 every day, lisinopril 10 qd

## 2023-05-07 NOTE — Assessment & Plan Note (Signed)
Lab Results  Component Value Date   VITAMINB12 165 (L) 05/04/2023   Low, to start oral replacement - b12 1000 mcg qd

## 2023-05-07 NOTE — Assessment & Plan Note (Signed)
Lab Results  Component Value Date   HGBA1C 6.0 05/04/2023   Stable, pt to continue current medical treatment  - diet, wt control

## 2023-05-07 NOTE — Assessment & Plan Note (Signed)
Mild to mod, for otc nasacort asd, to f/u any worsening symptoms or concerns ?

## 2023-06-19 DIAGNOSIS — M25561 Pain in right knee: Secondary | ICD-10-CM | POA: Diagnosis not present

## 2023-06-22 ENCOUNTER — Other Ambulatory Visit: Payer: Self-pay | Admitting: Internal Medicine

## 2023-07-12 ENCOUNTER — Other Ambulatory Visit: Payer: Self-pay

## 2023-07-12 ENCOUNTER — Other Ambulatory Visit: Payer: Self-pay | Admitting: Internal Medicine

## 2023-07-19 ENCOUNTER — Other Ambulatory Visit: Payer: Self-pay | Admitting: Internal Medicine

## 2023-10-06 ENCOUNTER — Ambulatory Visit: Payer: Self-pay | Admitting: Internal Medicine

## 2023-10-06 NOTE — Telephone Encounter (Signed)
  Chief Complaint: blood in urine Symptoms: passed bloody urine Frequency: started 45 minutes ago and patient endorses 2 episodes Pertinent Negatives: Patient denies pain, fever Disposition: [] ED /[x] Urgent Care (no appt availability in office) / [] Appointment(In office/virtual)/ []  [facility]/ [] Home Care/ [] Refused Recommended Disposition /[] [facility]/ []  Follow-up with PCP Additional Notes: patient called with concerns for blood in urine. Patient states he has had 2 episodes of blood in his urine-1st time urine was quite bloody, 2nd time urine was less bloody. Patient also endorses urinary frequency but states he drinks a lot of coffee and tea. Per protocol, patient is recommended to be seen in the next 24 hours. No appointment in office until Tuesday 10/10/2023. Patient is recommended to Urgent Care for evaluation. Patient is educated on the importance of getting evaluated. Patient verbalized understanding of plan and states he will be going to Urgent Care to be checked out. Patient requests for an acute appointment for 10/10/2023 for 11:00 am to be evaluated by PCP which was done. All questions answered.    Copied from CRM [id number]. Topic: Clinical - Red Word Triage >> Oct 06, 2023 10:13 AM Eunice Blase wrote: Red Word that prompted transfer to Nurse Triage: Pt has blood in urine. Reason for Disposition  Blood in urine  (Exception: Could be normal menstrual bleeding.)  Answer Assessment - Initial Assessment Questions 1. COLOR of URINE: "Describe the color of the urine."  (e.g., tea-colored, pink, red, bloody) "Do you have blood clots in your urine?" (e.g., none, pea, grape, small coin)     Bloody with first urination and then blood became lighter 2. ONSET: "When did the bleeding start?"      45 minutes ago 3. EPISODES: "How many times has there been blood in the urine?" or "How many times today?"     2 episodes 4. PAIN with URINATION: "Is there any pain with passing  your urine?" If Yes, ask: "How bad is the pain?"  (Scale 1-10; or mild, moderate, severe)    - MILD: Complains slightly about urination hurting.    - MODERATE: Interferes with normal activities.      - SEVERE: Excruciating, unwilling or unable to urinate because of the pain.      No 5. FEVER: "Do you have a fever?" If Yes, ask: "What is your temperature, how was it measured, and when did it start?"     No 6. ASSOCIATED SYMPTOMS: "Are you passing urine more frequently than usual?"     States he has been urinating for frequently but patient endorses drinking a lot of tea and coffee 7. OTHER SYMPTOMS: "Do you have any other symptoms?" (e.g., back/flank pain, abdomen pain, vomiting)     No  Protocols used: Urine - Blood In-A-AH

## 2023-10-10 ENCOUNTER — Ambulatory Visit: Payer: Medicare HMO | Admitting: Family Medicine

## 2023-10-19 ENCOUNTER — Other Ambulatory Visit: Payer: Self-pay | Admitting: Internal Medicine

## 2023-10-19 NOTE — Telephone Encounter (Signed)
Copied from CRM [id number]. Topic: Clinical - Medication Refill >> Oct 19, 2023 11:33 AM Adele Barthel wrote: Most Recent Primary Care Visit:  Provider: Corwin Levins  Department: [facility] [place]  Visit Type: PHYSICAL  Date: 05/04/2023  Medication: temazepam (RESTORIL) 30 MG capsule  Has the patient contacted their pharmacy? Yes (Agent: If no, request that the patient contact the pharmacy for the refill. If patient does not wish to contact the pharmacy document the reason why and proceed with request.) (Agent: If yes, when and what did the pharmacy advise?)  Is this the correct pharmacy for this prescription? Yes If no, delete pharmacy and type the correct one.  This is the patient's preferred pharmacy:  [facility] [address] [place] - [address] [place] [address] Phone: [phone] Fax: [phone]  [facility] - [place], [place] - [address] [place] [address] Phone: [phone] Fax: [phone]   Has the prescription been filled recently? Yes  Is the patient out of the medication? No, but has 2 tablets left  Has the patient been seen for an appointment in the last year OR does the patient have an upcoming appointment? Yes  Can we respond through MyChart? Yes  Agent: Please be advised that Rx refills may take up to 3 business days. We ask that you follow-up with your pharmacy.

## 2023-10-23 DIAGNOSIS — S42202A Unspecified fracture of upper end of left humerus, initial encounter for closed fracture: Secondary | ICD-10-CM | POA: Insufficient documentation

## 2023-10-23 DIAGNOSIS — S42255A Nondisplaced fracture of greater tuberosity of left humerus, initial encounter for closed fracture: Secondary | ICD-10-CM | POA: Diagnosis not present

## 2023-10-23 DIAGNOSIS — M25512 Pain in left shoulder: Secondary | ICD-10-CM | POA: Diagnosis not present

## 2023-10-30 DIAGNOSIS — M25512 Pain in left shoulder: Secondary | ICD-10-CM | POA: Diagnosis not present

## 2023-11-14 DIAGNOSIS — S42255A Nondisplaced fracture of greater tuberosity of left humerus, initial encounter for closed fracture: Secondary | ICD-10-CM | POA: Diagnosis not present

## 2023-11-28 DIAGNOSIS — M25512 Pain in left shoulder: Secondary | ICD-10-CM | POA: Diagnosis not present

## 2024-01-11 ENCOUNTER — Ambulatory Visit

## 2024-01-11 VITALS — Ht 72.0 in | Wt 233.0 lb

## 2024-01-11 DIAGNOSIS — Z0000 Encounter for general adult medical examination without abnormal findings: Secondary | ICD-10-CM

## 2024-01-11 NOTE — Patient Instructions (Addendum)
 Mr. Robert [name] , Thank you for taking time to come for your Medicare Wellness Visit. I appreciate your ongoing commitment to your health goals. Please review the following plan we discussed and let me know if I can assist you in the future.   Referrals/Orders/Follow-Ups/Clinician Recommendations: Aim for 30 minutes of exercise or brisk walking, 6-8 glasses of water , and 5 servings of fruits and vegetables each day. Discussed a repeat Colonoscopy (was due in 2019).  Educated and advised on the COVID vaccine.  This is a list of the screening recommended for you and due dates:  Health Maintenance  Topic Date Due   COVID-19 Vaccine (8 - 2024-25 season) 05/21/2023   Colon Cancer Screening  05/03/2024*   Flu Shot  04/19/2024   Medicare Annual Wellness Visit  01/10/2025   DTaP/Tdap/Td vaccine (5 - Td or Tdap) 05/21/2027   Pneumonia Vaccine  Completed   Hepatitis C Screening  Completed   Zoster (Shingles) Vaccine  Completed   HPV Vaccine  Aged Out   Meningitis B Vaccine  Aged Out  *Topic was postponed. The date shown is not the original due date.    Advanced directives: (Declined) Advance directive discussed with you today. Even though you declined this today, please call our office should you change your mind, and we can give you the proper paperwork for you to fill out.  Next Medicare Annual Wellness Visit scheduled for next year: Yes

## 2024-01-11 NOTE — Progress Notes (Addendum)
 Subjective:   Robert [name] is a 78 y.o. who presents for a Medicare Wellness preventive visit.  Visit Complete: Virtual I connected with  Robert [name] on 01/11/24 by a audio enabled telemedicine application and verified that I am speaking with the correct person using two identifiers.  Patient Location: Home  Provider Location: Office/Clinic  I discussed the limitations of evaluation and management by telemedicine. The patient expressed understanding and agreed to proceed.  Vital Signs: Because this visit was a virtual/telehealth visit, some criteria may be missing or patient reported. Any vitals not documented were not able to be obtained and vitals that have been documented are patient reported.  VideoDeclined- This patient declined Librarian, academic. Therefore the visit was completed with audio only.  Persons Participating in Visit: Patient.  AWV Questionnaire: No: Patient Medicare AWV questionnaire was not completed prior to this visit.  Cardiac Risk Factors include: advanced age (>86men, >6 women);hypertension;dyslipidemia;male gender;obesity (BMI >30kg/m2)     Objective:    Today's Vitals   01/11/24 0810  Weight: 233 lb (105.7 kg)  Height: 6' (1.829 m)   Body mass index is 31.6 kg/m.     01/11/2024    8:09 AM 08/29/2020    6:35 AM 07/01/2019    3:30 PM 06/24/2019    9:17 AM 09/18/2018   11:21 AM 09/07/2018    2:10 PM 06/08/2017    8:24 PM  Advanced Directives  Does Patient Have a Medical Advance Directive? No No No No No No No  Would patient like information on creating a medical advance directive? No - Patient declined No - Patient declined No - Patient declined  No - Patient declined  Yes (Inpatient - patient defers creating a medical advance directive at this time)    Current Medications (verified) Outpatient Encounter Medications as of 01/11/2024  Medication Sig   albuterol  (VENTOLIN  HFA) 108 (90 Base) MCG/ACT inhaler  Inhale 1-2 puffs into the lungs every 6 (six) hours as needed for wheezing or shortness of breath.   amLODipine  (NORVASC ) 5 MG tablet Take 1 tablet (5 mg total) by mouth daily.   amoxicillin (AMOXIL) 500 MG tablet Take 2,000 mg by mouth as directed. For dental procedures   atenolol  (TENORMIN ) 25 MG tablet Take 1 tablet (25 mg total) by mouth at bedtime.   clopidogrel  (PLAVIX ) 75 MG tablet Take 1 tablet (75 mg total) by mouth daily.   ezetimibe  (ZETIA ) 10 MG tablet Take 1 tablet (10 mg total) by mouth daily.   isosorbide  mononitrate (IMDUR ) 30 MG 24 hr tablet Take 1 tablet (30 mg total) by mouth 2 (two) times daily.   lisinopril  (ZESTRIL ) 10 MG tablet Take 1 tablet (10 mg total) by mouth daily.   nitroGLYCERIN  (NITROSTAT ) 0.4 MG SL tablet Place 1 tablet (0.4 mg total) under the tongue every 5 (five) minutes as needed for chest pain.   omeprazole  (PRILOSEC) 20 MG capsule Take 1 capsule (20 mg total) by mouth daily.   rosuvastatin  (CRESTOR ) 40 MG tablet Take 1 tablet (40 mg total) by mouth daily.   STIMULANT LAXATIVE 8.6-50 MG tablet TAKE 1 TABLET AT BEDTIME   sulindac  (CLINORIL ) 200 MG tablet TAKE 1 TABLET TWICE DAILY   temazepam  (RESTORIL ) 30 MG capsule TAKE 1 CAPSULE BY MOUTH AT BEDTIME AS NEEDED FOR SLEEP   [DISCONTINUED] temazepam  (RESTORIL ) 30 MG capsule TAKE 1 CAPSULE BY MOUTH AT BEDTIME AS NEEDED   No facility-administered encounter medications on file as of 01/11/2024.  Allergies (verified) Metoprolol, Adhesive [tape], and Lipitor [atorvastatin ]   History: Past Medical History:  Diagnosis Date   ALLERGIC RHINITIS 08/20/2007   Anemia    Aortic atherosclerosis (HCC)    CT in 08/2021   Arthritis    "back; knees; shoulders"  (06/08/2017)   Asthma    "very slight"    Barrett esophagus    BENIGN PROSTATIC HYPERTROPHY 08/20/2007   Coronary artery disease involving native heart with angina pectoris (HCC)    a. s/p CABG x 3 in 1989;  b. MI in 1995 with BMS to VG->RCA;  c. 07/2000  PCI/BMS to native LCX;  d. Known CTO of VG->Diag - multiple caths since then. e. PCI/DES to LCx 08/2020.   ELBOW PAIN, LEFT 04/15/2008   ERECTILE DYSFUNCTION 08/20/2007   Gallbladder polyp 08/27/2015   Noted on US  Abd   GERD (gastroesophageal reflux disease)    Hepatic cyst 09/06/2015   Large simple right hepatic lobe, noted on US  Abd   History of cardiomegaly 05/15/2013   Noted on CXR   History of meningitis    2nd grade, 8th grade, 9th grade, age 66   Hyperlipidemia LDL goal <70    HYPERTENSION    "been on RX since CABG but never a problem" (06/08/2017)   INSOMNIA-SLEEP DISORDER-UNSPEC 03/17/2008   Microcytosis    Mild dilation of ascending aorta (HCC)    Echo 12/22: EF 55-60, no RWMA, GR 1 DD, normal RVSF, severe LAE, mild RAE, mild MR aortic root 41, ascending aorta 42 // Chest/Aorta CTA 12/22: Ascending Aorta 41 mm; aortic atherosclerosis   Myocardial infarction (HCC) 02/1994   OSA on CPAP    PREMATURE VENTRICULAR CONTRACTIONS 08/20/2007   SCROTAL MASS 10/09/2008   URINARY INCONTINENCE, MALE 10/09/2008   UTI 04/24/2009   "just this once" (05/15/2013)   Past Surgical History:  Procedure Laterality Date   ANKLE FRACTURE SURGERY Right 1984   BACK SURGERY     CARDIAC CATHETERIZATION  X 2   CARDIAC CATHETERIZATION N/A 04/01/2016   Procedure: Left Heart Cath and Cors/Grafts Angiography;  Surgeon: Arleen Lacer, MD;  Location: [facility] INVASIVE CV LAB;  Service: Cardiovascular;  Laterality: N/A;   CARDIAC CATHETERIZATION N/A 04/01/2016   Procedure: Intravascular Pressure Wire/FFR Study;  Surgeon: Arleen Lacer, MD;  Location: [facility]VAS[facility] CV LAB;  Service: Cardiovascular;  Laterality: N/A;   CARDIAC CATHETERIZATION N/A 04/01/2016   Procedure: Coronary Stent Intervention;  Surgeon: Arleen Lacer, MD;  Location: [facility] INVASIVE CV LAB;  Service: Cardiovascular;  Laterality: N/A;   CARDIAC CATHETERIZATION  06/08/2017   COLONOSCOPY  08/31/2011   COLONOSCOPY W/ BIOPSIES AND POLYPECTOMY   11/20/2003   adenomatous polyp, internal and external hemorrhoids   CORONARY ANGIOPLASTY  95, 98, 2000, 2001   CORONARY ANGIOPLASTY WITH STENT PLACEMENT  1995- 04/01/2016   "total of 4 stents counting today" (04/01/2016)   CORONARY ARTERY BYPASS GRAFT  1989   triple bypass MUSC   CYSTOSCOPY  08/07/2009   and right hydrocelectomy   FOOT TENDON SURGERY Left 2012   "donor tendon"    HEMI-MICRODISCECTOMY LUMBAR LAMINECTOMY LEVEL 1 Right 09/18/2013   Procedure: CENTRAL DECOMPRESSION @ L4-5 FOR SPINAL STENOSIS. EXCISION OF SYNOVIAL CYST @ L4-5 RIGHT, MICRODISCECTOMY @L4 -5 RIGHT ;  Surgeon: Florencia Hunter, MD;  Location: [facility];  Service: Orthopedics;  Laterality: Right;   INGUINAL HERNIA REPAIR Bilateral 1970's   "2 on one side; 1 on the other"    KNEE ARTHROSCOPY WITH MEDIAL MENISECTOMY Left 09/18/2018  Procedure: DIAGNOSTIC KNEE ARTHROSCOPY WITH MEDIAL MENISECTOMY;  Surgeon: Hazle Lites, MD;  Location: [facility];  Service: Orthopedics;  Laterality: Left;    LEFT HEART CATH AND CORS/GRAFTS ANGIOGRAPHY N/A 06/08/2017   Procedure: LEFT HEART CATH AND CORS/GRAFTS ANGIOGRAPHY;  Surgeon: Avanell Leigh, MD;  Location: [facility] INVASIVE CV LAB;  Service: Cardiovascular;  Laterality: N/A;   LEFT HEART CATH AND CORS/GRAFTS ANGIOGRAPHY N/A 08/28/2020   Procedure: LEFT HEART CATH AND CORS/GRAFTS ANGIOGRAPHY;  Surgeon: Arnoldo Lapping, MD;  Location: [facility] INVASIVE CV LAB;  Service: Cardiovascular;  Laterality: N/A;   LEFT HEART CATHETERIZATION WITH CORONARY ANGIOGRAM Bilateral 05/15/2013   Procedure: LEFT HEART CATHETERIZATION WITH CORONARY ANGIOGRAM;  Surgeon: Wenona Hamilton, MD;  Location: [facility] CATH LAB;  Service: Cardiovascular;  Laterality: Bilateral;   SHOULDER OPEN ROTATOR CUFF REPAIR Right 2009   Gioffre   SURGERY SCROTAL / TESTICULAR Right 2012   "removed the sac" (05/15/2013)   TONSILLECTOMY AND ADENOIDECTOMY  ~1961   TOTAL KNEE ARTHROPLASTY Left 07/01/2019   Procedure: LEFT TOTAL KNEE  ARTHROPLASTY;  Surgeon: Liliane Rei, MD;  Location: [facility];  Service: Orthopedics;  Laterality: Left;    TRANSURETHRAL RESECTION OF PROSTATE  10/02/2009   Gyrus transurethral resection    UPPER GI ENDOSCOPY  09/06/2011   Family History  Problem Relation Age of Onset   Coronary artery disease Father    Coronary artery disease Mother        pacemaker   Heart disease Brother    Esophageal cancer Brother    Social History   Socioeconomic History   Marital status: Married    Spouse name: Not on file   Number of children: 1   Years of education: Not on file   Highest education level: Not on file  Occupational History   Occupation: retired Radiographer, 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 [employer]    Comment: part time golf course  Tobacco Use   Smoking status: Former    Current packs/day: 0.00    Average packs/day: 1 pack/day for 45.0 years (45.0 ttl pk-yrs)    Types: Cigarettes    Start date: 04/23/1949    Quit date: 04/23/1994    Years since quitting: 29.7    Passive exposure: Past   Smokeless tobacco: Never  Vaping Use   Vaping status: Never Used  Substance and Sexual Activity   Alcohol use: Not Currently    Comment:  "I've  quit drinking in 1989"   Drug use: No   Sexual activity: Yes  Other Topics Concern   Not on file  Social History Narrative   Married   Social Drivers of Health   Financial Resource Strain: Low Risk  (01/11/2024)   Overall Financial Resource Strain (CARDIA)    Difficulty of Paying Living Expenses: Not hard at all  Food Insecurity: No Food Insecurity (01/11/2024)   Hunger Vital Sign    Worried About Running Out of Food in the Last Year: Never true    Ran Out of Food in the Last Year: Never true  Transportation Needs: No Transportation Needs (01/11/2024)   PRAPARE - Administrator, Civil Service (Medical): No    Lack of Transportation (Non-Medical): No  Physical Activity: Insufficiently Active (01/11/2024)   Exercise Vital Sign    Days of Exercise per Week: 3 days     Minutes of Exercise per Session: 40 min  Stress: No Stress Concern Present (01/11/2024)   [employer] of Occupational Health - Occupational Stress Questionnaire    Feeling of  Stress : Not at all  Social Connections: Socially Integrated (01/11/2024)   Social Connection and Isolation Panel [NHANES]    Frequency of Communication with Friends and Family: More than three times a week    Frequency of Social Gatherings with Friends and Family: More than three times a week    Attends Religious Services: More than 4 times per year    Active Member of [employer] or Organizations: Yes    Attends Engineer, structural: More than 4 times per year    Marital Status: Married    Tobacco Counseling Counseling given: No    Clinical Intake:  Pre-visit preparation completed: Yes  Pain : No/denies pain     BMI - recorded: 31.6 Nutritional Status: BMI > 30  Obese Nutritional Risks: None Diabetes: No  Lab Results  Component Value Date   HGBA1C 6.0 05/04/2023   HGBA1C 6.3 12/08/2020   HGBA1C 5.5 08/29/2019     How often do you need to have someone help you when you read instructions, pamphlets, or other written materials from your doctor or pharmacy?: 1 - Never  Interpreter Needed?: No  Information entered by :: Kandy Orris, CMA   Activities of Daily Living     01/11/2024    8:12 AM  In your present state of health, do you have any difficulty performing the following activities:  Hearing? 0  Vision? 0  Difficulty concentrating or making decisions? 0  Walking or climbing stairs? 0  Dressing or bathing? 0  Doing errands, shopping? 0  Preparing Food and eating ? N  Using the Toilet? N  In the past six months, have you accidently leaked urine? N  Do you have problems with loss of bowel control? N  Managing your Medications? N  Managing your Finances? N  Housekeeping or managing your Housekeeping? N    Patient Care Team: Roslyn Coombe, MD as PCP - Jan Mcgill, MD as PCP - Cardiology (Cardiology)  Indicate any recent Medical Services you may have received from other than [facility] providers in the past year (date may be approximate).     Assessment:   This is a routine wellness examination for Brownie.  Hearing/Vision screen Hearing Screening - Comments:: Denies hearing difficulties   Vision Screening - Comments:: Wears rx glasses - sees an Opthalmologist   Goals Addressed               This Visit's Progress     Patient Stated (pt-stated)        Patient stated he plans to restart exercising at the gym.       Depression Screen     01/11/2024    8:16 AM 05/04/2023    1:12 PM 07/19/2022    2:54 PM 02/28/2022   10:06 AM 11/19/2021   10:23 AM 12/07/2020    4:24 PM 12/07/2020    3:43 PM  PHQ 2/9 Scores  PHQ - 2 Score 0 0 0 0 0 0 0  PHQ- 9 Score 0   0       Fall Risk     01/11/2024    8:17 AM 05/04/2023    1:12 PM 07/19/2022    2:53 PM 02/28/2022   10:06 AM 11/19/2021   10:23 AM  Fall Risk   Falls in the past year? 1 0 0 0 0  Number falls in past yr: 0 0 0  0  Comment 1 - fractured collarbone      Injury with Fall?  1 0 0  0  Risk for fall due to :  No Fall Risks No Fall Risks    Follow up Falls prevention discussed;Falls evaluation completed Falls evaluation completed Falls evaluation completed      MEDICARE RISK AT HOME:  Medicare Risk at Home Any stairs in or around the home?: No If so, are there any without handrails?: No Home free of loose throw rugs in walkways, pet beds, electrical cords, etc?: Yes Adequate lighting in your home to reduce risk of falls?: Yes Life alert?: No Use of a cane, walker or w/c?: No Grab bars in the bathroom?: No Shower chair or bench in shower?: No Elevated toilet seat or a handicapped toilet?: Yes  TIMED UP AND GO:  Was the test performed?  No  Cognitive Function: 6CIT completed        01/11/2024    8:17 AM  6CIT Screen  What Year? 0 points  What month? 0 points  What time? 0  points  Count back from 20 0 points  Months in reverse 0 points  Repeat phrase 0 points  Total Score 0 points    Immunizations Immunization History  Administered Date(s) Administered   Fluad Quad(high Dose 65+) 08/13/2019, 06/19/2020   H1N1 10/09/2008, 10/22/2008   Influenza Whole 08/20/2007, 07/21/2008   Influenza, High Dose Seasonal PF 08/04/2015, 09/08/2016   Influenza, Seasonal, Injecte, Preservative Fre 10/28/2009, 10/13/2010, 11/16/2011, 05/31/2012, 08/08/2013, 07/17/2014   Influenza,inj,Quad PF,6+ Mos 10/04/2021   Influenza-Unspecified 06/21/2000, 06/19/2001, 06/19/2002, 08/20/2003, 07/20/2004, 07/20/2005, 06/19/2006, 07/21/2007, 08/12/2008, 07/20/2013, 08/04/2015, 09/19/2017, 10/04/2021   Moderna Covid-19 Fall Seasonal Vaccine 53yrs & older 07/21/2022   PFIZER Comirnaty(Gray Top)Covid-19 Tri-Sucrose Vaccine 04/09/2021   PFIZER(Purple Top)SARS-COV-2 Vaccination 11/10/2019, 12/02/2019, 06/29/2020   PNEUMOCOCCAL CONJUGATE-20 05/28/2021   Pfizer Covid-19 Vaccine Bivalent Booster 55yrs & up 07/01/2021, 07/20/2021   Pneumococcal Conjugate-13 06/24/2014, 07/17/2014   Pneumococcal Polysaccharide-23 09/28/2000, 05/31/2012, 06/24/2013   Pneumococcal-Unspecified 05/20/2010   Rsv, Bivalent, Protein Subunit Rsvpref,pf (Abrysvo) 01/17/2023   Td 10/09/2008   Td (Adult), 2 Lf Tetanus Toxid, Preservative Free 10/09/2008   Tdap 11/16/2011, 05/20/2017   Zoster Recombinant(Shingrix) 11/14/2017, 05/15/2018   Zoster, Live 01/15/2014    Screening Tests Health Maintenance  Topic Date Due   COVID-19 Vaccine (8 - 2024-25 season) 05/21/2023   Colonoscopy  05/03/2024 (Originally 09/05/2018)   INFLUENZA VACCINE  04/19/2024   Medicare Annual Wellness (AWV)  01/10/2025   DTaP/Tdap/Td (5 - Td or Tdap) 05/21/2027   Pneumonia Vaccine 94+ Years old  Completed   Hepatitis C Screening  Completed   Zoster Vaccines- Shingrix  Completed   HPV VACCINES  Aged Out   Meningococcal B Vaccine  Aged Out     Health Maintenance  Health Maintenance Due  Topic Date Due   COVID-19 Vaccine (8 - 2024-25 season) 05/21/2023   Health Maintenance Items Addressed:  Colonoscopy status: Completed 08/2011 - repeat in 7 years.  Pt declines a repeat Colonoscopy.  Additional Screening:  Vision Screening: Recommended annual ophthalmology exams for early detection of glaucoma and other disorders of the eye.  Pt stated he does see an Ophthalmologist and plans to schedule an eye exam in 2025.  Pt is unable to recall the name of the Ophthalmologist at this time.  Dental Screening: Recommended annual dental exams for proper oral hygiene  Community Resource Referral / Chronic Care Management: CRR required this visit?  No   CCM required this visit?  No     Plan:     I have personally reviewed and  noted the following in the patient's chart:   Medical and social history Use of alcohol, tobacco or illicit drugs  Current medications and supplements including opioid prescriptions. Patient is not currently taking opioid prescriptions. Functional ability and status Nutritional status Physical activity Advanced directives List of other physicians Hospitalizations, surgeries, and ER visits in previous 12 months Vitals Screenings to include cognitive, depression, and falls Referrals and appointments  In addition, I have reviewed and discussed with patient certain preventive protocols, quality metrics, and best practice recommendations. A written personalized care plan for preventive services as well as general preventive health recommendations were provided to patient.     Patria Bookbinder, CMA   01/11/2024   After Visit Summary: (MyChart) Due to this being a telephonic visit, the after visit summary with patients personalized plan was offered to patient via MyChart   Notes: Please refer to Routing Comments.

## 2024-02-16 ENCOUNTER — Other Ambulatory Visit: Payer: Self-pay | Admitting: Internal Medicine

## 2024-02-23 ENCOUNTER — Other Ambulatory Visit: Payer: Self-pay | Admitting: Internal Medicine

## 2024-02-26 DIAGNOSIS — R3121 Asymptomatic microscopic hematuria: Secondary | ICD-10-CM | POA: Diagnosis not present

## 2024-02-26 DIAGNOSIS — R351 Nocturia: Secondary | ICD-10-CM | POA: Diagnosis not present

## 2024-02-26 DIAGNOSIS — N3941 Urge incontinence: Secondary | ICD-10-CM | POA: Diagnosis not present

## 2024-02-26 DIAGNOSIS — N401 Enlarged prostate with lower urinary tract symptoms: Secondary | ICD-10-CM | POA: Diagnosis not present

## 2024-03-19 ENCOUNTER — Ambulatory Visit: Admitting: Internal Medicine

## 2024-03-19 ENCOUNTER — Encounter: Payer: Self-pay | Admitting: Internal Medicine

## 2024-03-19 VITALS — BP 116/74 | HR 65 | Temp 98.8°F | Ht 72.0 in | Wt 244.0 lb

## 2024-03-19 DIAGNOSIS — E538 Deficiency of other specified B group vitamins: Secondary | ICD-10-CM

## 2024-03-19 DIAGNOSIS — H903 Sensorineural hearing loss, bilateral: Secondary | ICD-10-CM

## 2024-03-19 DIAGNOSIS — Z961 Presence of intraocular lens: Secondary | ICD-10-CM | POA: Insufficient documentation

## 2024-03-19 DIAGNOSIS — H6122 Impacted cerumen, left ear: Secondary | ICD-10-CM | POA: Insufficient documentation

## 2024-03-19 NOTE — Assessment & Plan Note (Signed)
 Lab Results  Component Value Date   VITAMINB12 165 (L) 05/04/2023   Low, reminded to start oral replacement - b12 1000 mcg qd

## 2024-03-19 NOTE — Progress Notes (Signed)
 Patient ID: Robert [name], male   DOB: 1946/07/31, 78 y.o.   MRN: [id number]        Chief Complaint: follow up left hearing loss despite left hearing aid       HPI:  Robert [name] is a 78 y.o. male here with c/o above, no pain or fever, No vertigo or falls.   Pt denies chest pain, increased sob or doe, wheezing, orthopnea, PND, increased LE swelling, palpitations, dizziness or syncope.   Pt denies polydipsia, polyuria, or new focal neuro s/s.    Pt denies fever, wt loss, night sweats, loss of appetite, or other constitutional symptoms         Wt Readings from Last 3 Encounters:  03/19/24 244 lb (110.7 kg)  01/11/24 233 lb (105.7 kg)  05/04/23 243 lb (110.2 kg)   BP Readings from Last 3 Encounters:  03/19/24 116/74  05/04/23 124/82  04/10/23 120/74         Past Medical History:  Diagnosis Date   ALLERGIC RHINITIS 08/20/2007   Anemia    Aortic atherosclerosis (HCC)    CT in 08/2021   Arthritis    back; knees; shoulders  (06/08/2017)   Asthma    very slight    Barrett esophagus    BENIGN PROSTATIC HYPERTROPHY 08/20/2007   Coronary artery disease involving native heart with angina pectoris (HCC)    a. s/p CABG x 3 in 1989;  b. MI in 1995 with BMS to VG->RCA;  c. 07/2000 PCI/BMS to native LCX;  d. Known CTO of VG->Diag - multiple caths since then. e. PCI/DES to LCx 08/2020.   ELBOW PAIN, LEFT 04/15/2008   ERECTILE DYSFUNCTION 08/20/2007   Gallbladder polyp 08/27/2015   Noted on US  Abd   GERD (gastroesophageal reflux disease)    Hepatic cyst 09/06/2015   Large simple right hepatic lobe, noted on US  Abd   History of cardiomegaly 05/15/2013   Noted on CXR   History of meningitis    2nd grade, 8th grade, 9th grade, age 69   Hyperlipidemia LDL goal <70    HYPERTENSION    been on RX since CABG but never a problem (06/08/2017)   INSOMNIA-SLEEP DISORDER-UNSPEC 03/17/2008   Microcytosis    Mild dilation of ascending aorta (HCC)    Echo 12/22: EF 55-60, no RWMA, GR 1 DD,  normal RVSF, severe LAE, mild RAE, mild MR aortic root 41, ascending aorta 42 // Chest/Aorta CTA 12/22: Ascending Aorta 41 mm; aortic atherosclerosis   Myocardial infarction (HCC) 02/1994   OSA on CPAP    PREMATURE VENTRICULAR CONTRACTIONS 08/20/2007   SCROTAL MASS 10/09/2008   URINARY INCONTINENCE, MALE 10/09/2008   UTI 04/24/2009   just this once (05/15/2013)   Past Surgical History:  Procedure Laterality Date   ANKLE FRACTURE SURGERY Right 1984   BACK SURGERY     CARDIAC CATHETERIZATION  X 2   CARDIAC CATHETERIZATION N/A 04/01/2016   Procedure: Left Heart Cath and Cors/Grafts Angiography;  Surgeon: Alm LELON Clay, MD;  Location: [facility] INVASIVE CV LAB;  Service: Cardiovascular;  Laterality: N/A;   CARDIAC CATHETERIZATION N/A 04/01/2016   Procedure: Intravascular Pressure Wire/FFR Study;  Surgeon: Alm LELON Clay, MD;  Location: [facility] INVASIVE CV LAB;  Service: Cardiovascular;  Laterality: N/A;   CARDIAC CATHETERIZATION N/A 04/01/2016   Procedure: Coronary Stent Intervention;  Surgeon: Alm LELON Clay, MD;  Location: [facility] INVASIVE CV LAB;  Service: Cardiovascular;  Laterality: N/A;   CARDIAC CATHETERIZATION  06/08/2017   COLONOSCOPY  08/31/2011  COLONOSCOPY W/ BIOPSIES AND POLYPECTOMY  11/20/2003   adenomatous polyp, internal and external hemorrhoids   CORONARY ANGIOPLASTY  95, 98, 2000, 2001   CORONARY ANGIOPLASTY WITH STENT PLACEMENT  1995- 04/01/2016   total of 4 stents counting today (04/01/2016)   CORONARY ARTERY BYPASS GRAFT  1989   triple bypass MUSC   CYSTOSCOPY  08/07/2009   and right hydrocelectomy   FOOT TENDON SURGERY Left 2012   donor tendon    HEMI-MICRODISCECTOMY LUMBAR LAMINECTOMY LEVEL 1 Right 09/18/2013   Procedure: CENTRAL DECOMPRESSION @ L4-5 FOR SPINAL STENOSIS. EXCISION OF SYNOVIAL CYST @ L4-5 RIGHT, MICRODISCECTOMY @L4 -5 RIGHT ;  Surgeon: Tanda DELENA Heading, MD;  Location: [facility] OR[facility];  Service: Orthopedics;  Laterality: Right;   INGUINAL HERNIA REPAIR Bilateral 1970's    2 on one side; 1 on the other    KNEE ARTHROSCOPY WITH MEDIAL MENISECTOMY Left 09/18/2018   Procedure: DIAGNOSTIC KNEE ARTHROSCOPY WITH MEDIAL MENISECTOMY;  Surgeon: Heading Tanda, MD;  Location: [facility];  Service: Orthopedics;  Laterality: Left;    LEFT HEART CATH AND CORS/GRAFTS ANGIOGRAPHY N/A 06/08/2017   Procedure: LEFT HEART CATH AND CORS/GRAFTS ANGIOGRAPHY;  Surgeon: Court Dorn PARAS, MD;  Location: [facility] INVASIVE CV LAB;  Service: Cardiovascular;  Laterality: N/A;   LEFT HEART CATH AND CORS/GRAFTS ANGIOGRAPHY N/A 08/28/2020   Procedure: LEFT HEART CATH AND CORS/GRAFTS ANGIOGRAPHY;  Surgeon: Wonda Sharper, MD;  Location: [facility] INVASIVE CV LAB;  Service: Cardiovascular;  Laterality: N/A;   LEFT HEART CATHETERIZATION WITH CORONARY ANGIOGRAM Bilateral 05/15/2013   Procedure: LEFT HEART CATHETERIZATION WITH CORONARY ANGIOGRAM;  Surgeon: Deatrice DELENA Cage, MD;  Location: [facility] CATH LAB;  Service: Cardiovascular;  Laterality: Bilateral;   SHOULDER OPEN ROTATOR CUFF REPAIR Right 2009   Gioffre   SURGERY SCROTAL / TESTICULAR Right 2012   removed the sac (05/15/2013)   TONSILLECTOMY AND ADENOIDECTOMY  ~1961   TOTAL KNEE ARTHROPLASTY Left 07/01/2019   Procedure: LEFT TOTAL KNEE ARTHROPLASTY;  Surgeon: Melodi Lerner, MD;  Location: [facility];  Service: Orthopedics;  Laterality: Left;    TRANSURETHRAL RESECTION OF PROSTATE  10/02/2009   Gyrus transurethral resection    UPPER GI ENDOSCOPY  09/06/2011    reports that he quit smoking about 29 years ago. His smoking use included cigarettes. He started smoking about 74 years ago. He has a 45 pack-year smoking history. He has been exposed to tobacco smoke. He has never used smokeless tobacco. He reports that he does not currently use alcohol. He reports that he does not use drugs. family history includes Coronary artery disease in his father and mother; Esophageal cancer in his brother; Heart disease in his brother. Allergies  Allergen Reactions    Metoprolol Other (See Comments)    severe bradycardia   Adhesive [Tape] Rash   Lipitor [Atorvastatin ] Other (See Comments)    Worsening reflux   Current Outpatient Medications on File Prior to Visit  Medication Sig Dispense Refill   albuterol  (VENTOLIN  HFA) 108 (90 Base) MCG/ACT inhaler Inhale 1-2 puffs into the lungs every 6 (six) hours as needed for wheezing or shortness of breath.     amLODipine  (NORVASC ) 5 MG tablet Take 1 tablet (5 mg total) by mouth daily. 90 tablet 3   amoxicillin (AMOXIL) 500 MG tablet Take 2,000 mg by mouth as directed. For dental procedures     atenolol  (TENORMIN ) 25 MG tablet Take 1 tablet (25 mg total) by mouth at bedtime. 90 tablet 3   clopidogrel  (PLAVIX ) 75 MG tablet Take 1 tablet (  75 mg total) by mouth daily. 90 tablet 3   ezetimibe  (ZETIA ) 10 MG tablet Take 1 tablet (10 mg total) by mouth daily. 90 tablet 3   isosorbide  mononitrate (IMDUR ) 30 MG 24 hr tablet Take 1 tablet (30 mg total) by mouth 2 (two) times daily. 180 tablet 3   lisinopril  (ZESTRIL ) 10 MG tablet Take 1 tablet (10 mg total) by mouth daily. 90 tablet 3   nitroGLYCERIN  (NITROSTAT ) 0.4 MG SL tablet Place 1 tablet (0.4 mg total) under the tongue every 5 (five) minutes as needed for chest pain. 25 tablet 11   omeprazole  (PRILOSEC) 20 MG capsule Take 1 capsule (20 mg total) by mouth daily. 90 capsule 3   rosuvastatin  (CRESTOR ) 40 MG tablet Take 1 tablet (40 mg total) by mouth daily. 90 tablet 3   STIMULANT LAXATIVE 8.6-50 MG tablet TAKE 1 TABLET AT BEDTIME 90 tablet 3   sulindac  (CLINORIL ) 200 MG tablet TAKE 1 TABLET TWICE DAILY 180 tablet 3   temazepam  (RESTORIL ) 30 MG capsule TAKE 1 CAPSULE BY MOUTH AT BEDTIME AS NEEDED FOR SLEEP 90 capsule 1   No current facility-administered medications on file prior to visit.        ROS:  All others reviewed and negative.  Objective        PE:  BP 116/74 (BP Location: Right Arm, Patient Position: Sitting, Cuff Size: Normal)   Pulse 65   Temp 98.8 F  (37.1 C) (Oral)   Ht 6' (1.829 m)   Wt 244 lb (110.7 kg)   SpO2 97%   BMI 33.09 kg/m                 Constitutional: Pt appears in NAD               HENT: Head: NCAT.                Right Ear: External ear normal.                 Left Ear: External ear normal. Left cerumen impaction resolved               Eyes: . Pupils are equal, round, and reactive to light. Conjunctivae and EOM are normal               Nose: without d/c or deformity               Neck: Neck supple. Gross normal ROM               Cardiovascular: Normal rate and regular rhythm.                 Pulmonary/Chest: Effort normal and breath sounds without rales or wheezing.                Abd:  Soft, NT, ND, + BS, no organomegaly               Neurological: Pt is alert. At baseline orientation, motor grossly intact               Skin: Skin is warm. No rashes, no other new lesions, LE edema - none               Psychiatric: Pt behavior is normal without agitation   Micro: none  Cardiac tracings I have personally interpreted today:  none  Pertinent Radiological findings (summarize): none   Lab Results  Component Value Date   WBC 6.7 05/04/2023  HGB 14.5 05/04/2023   HCT 45.6 05/04/2023   PLT 215.0 05/04/2023   GLUCOSE 86 05/04/2023   CHOL 117 05/04/2023   TRIG 96.0 05/04/2023   HDL 33.40 (L) 05/04/2023   LDLDIRECT 71.0 12/08/2020   LDLCALC 64 05/04/2023   ALT 14 05/04/2023   AST 15 05/04/2023   NA 136 05/04/2023   K 4.5 05/04/2023   CL 102 05/04/2023   CREATININE 0.95 05/04/2023   BUN 23 05/04/2023   CO2 26 05/04/2023   TSH 1.22 05/04/2023   PSA 0.94 05/04/2023   INR 1.1 06/24/2019   HGBA1C 6.0 05/04/2023   Assessment/Plan:  Robert [name] is a 78 y.o. White or Caucasian [1] male with  has a past medical history of ALLERGIC RHINITIS (08/20/2007), Anemia, Aortic atherosclerosis (HCC), Arthritis, Asthma, Barrett esophagus, BENIGN PROSTATIC HYPERTROPHY (08/20/2007), Coronary artery disease involving  native heart with angina pectoris (HCC), ELBOW PAIN, LEFT (04/15/2008), ERECTILE DYSFUNCTION (08/20/2007), Gallbladder polyp (08/27/2015), GERD (gastroesophageal reflux disease), Hepatic cyst (09/06/2015), History of cardiomegaly (05/15/2013), History of meningitis, Hyperlipidemia LDL goal <70, HYPERTENSION, INSOMNIA-SLEEP DISORDER-UNSPEC (03/17/2008), Microcytosis, Mild dilation of ascending aorta (HCC), Myocardial infarction (HCC) (02/1994), OSA on CPAP, PREMATURE VENTRICULAR CONTRACTIONS (08/20/2007), SCROTAL MASS (10/09/2008), URINARY INCONTINENCE, MALE (10/09/2008), and UTI (04/24/2009).  Impacted cerumen of left ear Resolved today,  to f/u any worsening symptoms or concerns  Sensorineural hearing loss, bilateral Stable, cont bilateral hearing aids  B12 deficiency Lab Results  Component Value Date   VITAMINB12 165 (L) 05/04/2023   Low, reminded to start oral replacement - b12 1000 mcg qd  Followup: Return if symptoms worsen or fail to improve.  Lynwood Rush, MD 03/19/2024 8:03 PM [facility]

## 2024-03-19 NOTE — Assessment & Plan Note (Signed)
 Resolved today,  to f/u any worsening symptoms or concerns

## 2024-03-19 NOTE — Assessment & Plan Note (Signed)
 Stable, cont bilateral hearing aids

## 2024-03-19 NOTE — Patient Instructions (Signed)
 Your left ear was cleared today  Please continue all other medications as before, and refills have been done if requested.  Please have the pharmacy call with any other refills you may need.  Please keep your appointments with your specialists as you may have planned

## 2024-03-29 DIAGNOSIS — R3129 Other microscopic hematuria: Secondary | ICD-10-CM | POA: Diagnosis not present

## 2024-03-29 DIAGNOSIS — R3121 Asymptomatic microscopic hematuria: Secondary | ICD-10-CM | POA: Diagnosis not present

## 2024-03-29 DIAGNOSIS — K4090 Unilateral inguinal hernia, without obstruction or gangrene, not specified as recurrent: Secondary | ICD-10-CM | POA: Diagnosis not present

## 2024-03-29 DIAGNOSIS — N400 Enlarged prostate without lower urinary tract symptoms: Secondary | ICD-10-CM | POA: Diagnosis not present

## 2024-03-29 DIAGNOSIS — N200 Calculus of kidney: Secondary | ICD-10-CM | POA: Diagnosis not present

## 2024-04-01 ENCOUNTER — Other Ambulatory Visit: Payer: Self-pay | Admitting: Internal Medicine

## 2024-05-08 ENCOUNTER — Other Ambulatory Visit: Payer: Self-pay | Admitting: Internal Medicine

## 2024-05-29 ENCOUNTER — Other Ambulatory Visit: Payer: Self-pay | Admitting: Internal Medicine

## 2024-05-29 DIAGNOSIS — I2510 Atherosclerotic heart disease of native coronary artery without angina pectoris: Secondary | ICD-10-CM

## 2024-06-20 ENCOUNTER — Other Ambulatory Visit: Payer: Self-pay | Admitting: Internal Medicine

## 2024-06-20 ENCOUNTER — Other Ambulatory Visit: Payer: Self-pay

## 2024-07-13 ENCOUNTER — Other Ambulatory Visit: Payer: Self-pay | Admitting: Internal Medicine

## 2024-07-15 ENCOUNTER — Other Ambulatory Visit: Payer: Self-pay

## 2024-07-17 DIAGNOSIS — R310 Gross hematuria: Secondary | ICD-10-CM | POA: Diagnosis not present

## 2024-07-17 DIAGNOSIS — N3289 Other specified disorders of bladder: Secondary | ICD-10-CM | POA: Diagnosis not present

## 2024-07-17 DIAGNOSIS — N200 Calculus of kidney: Secondary | ICD-10-CM | POA: Diagnosis not present

## 2024-07-17 DIAGNOSIS — R82998 Other abnormal findings in urine: Secondary | ICD-10-CM | POA: Diagnosis not present

## 2024-07-17 DIAGNOSIS — R399 Unspecified symptoms and signs involving the genitourinary system: Secondary | ICD-10-CM | POA: Diagnosis not present

## 2024-07-17 DIAGNOSIS — N401 Enlarged prostate with lower urinary tract symptoms: Secondary | ICD-10-CM | POA: Diagnosis not present

## 2024-08-01 ENCOUNTER — Telehealth: Payer: Self-pay | Admitting: Cardiovascular Disease

## 2024-08-01 ENCOUNTER — Other Ambulatory Visit: Payer: Self-pay | Admitting: Urology

## 2024-08-01 NOTE — Telephone Encounter (Signed)
   Pre-operative Risk Assessment    Patient Name: Robert [name]  DOB: 1946/05/10 MRN: [id number]   Date of last office visit: 04/10/2023 Date of next office visit: TBD    Request for Surgical Clearance    Procedure:  resection of bladder tumor   Date of Surgery:  Clearance 08/27/24                                Surgeon:  Dr. Norleen Seltzer Surgeon's Group or Practice Name:  [facility]  Phone number:  [phone] Fax number:  [phone]   Type of Clearance Requested:   - Medical  - Pharmacy:  Hold Clopidogrel  (Plavix ) defer to cardiologist    Type of Anesthesia:  General    Additional requests/questions:    Signed, Larraine Salt   08/01/2024, 1:17 PM

## 2024-08-02 NOTE — Telephone Encounter (Signed)
 Called patient to schedule an office appointment for a pre-op clearance on 08/20/24 @ 12:30 with Olivia Pavy PA.

## 2024-08-02 NOTE — Telephone Encounter (Signed)
   Name: Robert [name]  DOB: 04-19-1946  MRN: [id number]  Primary Cardiologist: Ozell Fell, MD  Patient has not been seen in our office in >1 year (03/2023). Chart reviewed as part of pre-operative protocol coverage. Because of Kalub Morillo Caine's past medical history and time since last visit, he will require a follow-up in-office visit in order to better assess preoperative cardiovascular risk.  Pre-op covering staff: - Please schedule appointment and call patient to inform them. If patient already had an upcoming appointment within acceptable timeframe, please add pre-op clearance to the appointment notes so provider is aware. - Please contact requesting surgeon's office via preferred method (i.e, phone, fax) to inform them of need for appointment prior to surgery.  This message will also be routed to Dr Fell for input on holding Plavix  as requested below so that this information is available to the clearing provider at time of patient's appointment.   Dr. Fell, patient has a history of CAD s/p remote CABG in 1989 with multiple subsequent PCIs Last PCI was DES to LCX in 08/2020 just prior to previously placed stent. Can you please provide recommendations for holding Plavix  for upcoming procedure.? Please route response back to P CV DIV PREOP. Thank you!  Kore Madlock E Arsh Feutz, PA-C  08/02/2024, 9:26 AM

## 2024-08-02 NOTE — Telephone Encounter (Signed)
 Left message to call back to schedule IN OFFICE PRE OP APPT.

## 2024-08-04 NOTE — Telephone Encounter (Signed)
 Pt at low risk of holding plavix  x 5 days prior to surgery. Start back post-op when cleared by surgeon. thanks

## 2024-08-09 NOTE — Progress Notes (Signed)
 Cardiology Office Note:  .   Date:  08/20/2024  ID:  Robert [name], DOB 07-28-1946, MRN [id number] PCP: Norleen Lynwood ORN, MD  [facility] Providers Cardiologist:  Ozell Fell, MD    History of Present Illness: Robert [name]   Robert [name] is a 78 y.o. male  with history of  CAD s/p remote CABG in 1989 with multiple subsequent PCIs Last PCI was DES to LCX in 08/2020 just prior to previously placed stent, HTN, HLD, Ascending aortic aneurysm, PVC's.  Patient here for preop clearance for bladder tumor removal. Denies chest pain, dyspnea, edema, palpitations.He plays golf a couple days a week. Walks 25 min a day.  ROS:    Studies Reviewed: Robert [name]    EKG Interpretation Date/Time:  Tuesday August 20 2024 12:17:07 EST Ventricular Rate:  61 PR Interval:  212 QRS Duration:  88 QT Interval:  412 QTC Calculation: 414 R Axis:   15  Text Interpretation: Sinus rhythm with 1st degree A-V block with occasional Premature ventricular complexes When compared with ECG of 10-Apr-2023 08:19, Premature ventricular complexes are now Present Confirmed by Parthenia Klinefelter [phone]) on 08/20/2024 12:21:30 PM    Prior CV Studies:    CTA 12/2022 MPRESSION: Relatively unchanged size and configuration of the ascending aorta, estimated 41 mm on the current CT. Recommend annual imaging followup by CTA or MRA. This recommendation follows 2010 ACCF/AHA/AATS/ACR/ASA/SCA/SCAI/SIR/STS/SVM Guidelines for the Diagnosis and Management of Patients with Thoracic Aortic Disease. Circulation. 2010; 121: Z733-z630. Aortic aneurysm NOS (ICD10-I71.9)   Aortic Atherosclerosis (ICD10-I70.0).   Signed,   Ami RAMAN. Alona ROSALEA GRAVER, RPVI   [facility]   [facility]     Electronically Signed   By: Ami Alona D.O.   On: 12/20/2022 15:26    Risk Assessment/Calculations:             Physical Exam:   VS:  BP 110/62 (BP Location: Left Arm, Patient Position: Sitting,  Cuff Size: Large)   Pulse 61   Ht 6' (1.829 m)   Wt 242 lb (109.8 kg)   BMI 32.82 kg/m    Orhtostatics: No data found. Wt Readings from Last 3 Encounters:  08/20/24 242 lb (109.8 kg)  03/19/24 244 lb (110.7 kg)  01/11/24 233 lb (105.7 kg)    GEN: Obese, in no acute distress NECK: No JVD; No carotid bruits CARDIAC:  RRR, no murmurs, rubs, gallops RESPIRATORY:  Clear to auscultation without rales, wheezing or rhonchi  ABDOMEN: Soft, non-tender, non-distended EXTREMITIES:  No edema; No deformity   ASSESSMENT AND PLAN: .    Preop clearance for cyctoscopy with resection of bladder tumor by Dr. Watt 08/27/24 Dr. Fell felt he was low risk to hold plavix  5 days prior to surgery and start back post op when cleared by surgeon. Patient with CAD asymptomatic and is reasonable risk to proceed with above surgery without further cardiac testing.  According to the Revised Cardiac Risk Index (RCRI), his Perioperative Risk of Major Cardiac Event is (%): 0.9  His Functional Capacity in METs is: 5.81 according to the [employer] Activity Status Index (DASI).   Coronary artery disease -no angina. Continue plavix , amlodipine , crestor , zetia , imdur , atenolol  -S/p CABG in 1989 -S/p multiple PCIs since CABG -Cath 9/18: OM2 stent patent, S-RCA stent patent, L-LAD patent, S-Dx 43 (old), EF 50-55 -NSTEMI 08/2020 s/p DES to pLCx Residual at cath: L-LAD patent, S-PDA patent with 50 ISR, mLCx stent patent   Hypertension -well controlled on above meds  Hyperlipidemia -on crestor  and zetia . 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          Dispo: f/u Dr.Cooper 1 yr  Signed, Olivia Pavy, PA-C

## 2024-08-20 ENCOUNTER — Encounter: Payer: Self-pay | Admitting: Physician Assistant

## 2024-08-20 ENCOUNTER — Ambulatory Visit: Attending: Physician Assistant | Admitting: Physician Assistant

## 2024-08-20 VITALS — BP 110/62 | HR 61 | Ht 72.0 in | Wt 242.0 lb

## 2024-08-20 DIAGNOSIS — Z01818 Encounter for other preprocedural examination: Secondary | ICD-10-CM

## 2024-08-20 DIAGNOSIS — I2510 Atherosclerotic heart disease of native coronary artery without angina pectoris: Secondary | ICD-10-CM | POA: Diagnosis not present

## 2024-08-20 DIAGNOSIS — I2583 Coronary atherosclerosis due to lipid rich plaque: Secondary | ICD-10-CM

## 2024-08-20 DIAGNOSIS — I493 Ventricular premature depolarization: Secondary | ICD-10-CM | POA: Diagnosis not present

## 2024-08-20 DIAGNOSIS — I10 Essential (primary) hypertension: Secondary | ICD-10-CM

## 2024-08-20 DIAGNOSIS — I77810 Thoracic aortic ectasia: Secondary | ICD-10-CM

## 2024-08-20 DIAGNOSIS — E785 Hyperlipidemia, unspecified: Secondary | ICD-10-CM | POA: Diagnosis not present

## 2024-08-20 NOTE — Patient Instructions (Addendum)
 Medication Instructions:  NO CHANGES  Lab Work: BMET TO BE DONE TODAY.  Testing/Procedures: Non-Cardiac CT Angiography (CTA), is a special type of CT scan that uses a computer to produce multi-dimensional views of major blood vessels throughout the body. In CT angiography, a contrast material is injected through an IV to help visualize the blood vessels   Follow-Up: At [facility], you and your health needs are our priority.  As part of our continuing mission to provide you with exceptional heart care, our providers are all part of one team.  This team includes your primary Cardiologist (physician) and Advanced Practice Providers or APPs (Physician Assistants and Nurse Practitioners) who all work together to provide you with the care you need, when you need it.  Your next appointment:   1 YEAR  Provider:   Ozell Fell, MD

## 2024-08-21 ENCOUNTER — Encounter (HOSPITAL_COMMUNITY): Payer: Self-pay | Admitting: Urology

## 2024-08-21 ENCOUNTER — Other Ambulatory Visit: Payer: Self-pay

## 2024-08-21 ENCOUNTER — Other Ambulatory Visit: Payer: Self-pay | Admitting: Internal Medicine

## 2024-08-21 ENCOUNTER — Ambulatory Visit: Payer: Self-pay | Admitting: Physician Assistant

## 2024-08-21 LAB — BASIC METABOLIC PANEL WITH GFR
BUN/Creatinine Ratio: 22 (ref 10–24)
BUN: 19 mg/dL (ref 8–27)
CO2: 21 mmol/L (ref 20–29)
Calcium: 9.2 mg/dL (ref 8.6–10.2)
Chloride: 102 mmol/L (ref 96–106)
Creatinine, Ser: 0.86 mg/dL (ref 0.76–1.27)
Glucose: 91 mg/dL (ref 70–99)
Potassium: 4.6 mmol/L (ref 3.5–5.2)
Sodium: 138 mmol/L (ref 134–144)
eGFR: 89 mL/min/1.73 (ref >59)

## 2024-08-21 NOTE — Progress Notes (Signed)
 Cardiac clearance dated 08/20/24 by EMERSON Pavy, PA in Epic.  Spoke w/ via phone for pre-op interview--- Robert [name] needs dos---- BMP per anesthesia.        [name] results------Current EKG in Epic dated 08/20/24. COVID test -----patient states asymptomatic no test needed Arrive at -------0645 NPO after [place] NO Solid Food.   Pre-Surgery Ensure or G2:  Med rec completed Medications to take morning of surgery -----Prilosec Diabetic medication -----  GLP1 agonist last dose: GLP1 instructions:  Patient instructed no nail polish to be worn day of surgery Patient instructed to bring photo id and insurance card day of surgery Patient aware to have Driver (ride ) / caregiver    for 24 hours after surgery - Robert [name]-sister Patient Special Instructions -----Hold Plavix  5 days prior to procedure per Dr Ricardo note in [place]. Pt verbalized understanding. Pre-Op special Instructions -----  Patient verbalized understanding of instructions that were given at this phone interview. Patient denies chest pain, sob, fever, cough at the interview.

## 2024-08-23 DIAGNOSIS — R8289 Other abnormal findings on cytological and histological examination of urine: Secondary | ICD-10-CM | POA: Diagnosis not present

## 2024-08-23 DIAGNOSIS — R310 Gross hematuria: Secondary | ICD-10-CM | POA: Diagnosis not present

## 2024-08-27 ENCOUNTER — Other Ambulatory Visit: Payer: Self-pay

## 2024-08-27 ENCOUNTER — Ambulatory Visit (HOSPITAL_COMMUNITY): Admitting: Anesthesiology

## 2024-08-27 ENCOUNTER — Ambulatory Visit (HOSPITAL_COMMUNITY): Admission: RE | Disposition: A | Payer: Self-pay | Source: Home / Self Care | Attending: Urology

## 2024-08-27 ENCOUNTER — Ambulatory Visit (HOSPITAL_COMMUNITY): Admission: RE | Admit: 2024-08-27 | Discharge: 2024-08-27 | Disposition: A | Attending: Urology | Admitting: Urology

## 2024-08-27 ENCOUNTER — Encounter (HOSPITAL_COMMUNITY): Payer: Self-pay | Admitting: Urology

## 2024-08-27 DIAGNOSIS — R310 Gross hematuria: Secondary | ICD-10-CM | POA: Diagnosis not present

## 2024-08-27 DIAGNOSIS — I10 Essential (primary) hypertension: Secondary | ICD-10-CM

## 2024-08-27 HISTORY — PX: CYSTOSCOPY WITH FULGERATION: SHX6638

## 2024-08-27 LAB — BASIC METABOLIC PANEL WITH GFR
Anion gap: 19 — ABNORMAL HIGH (ref 5–15)
BUN: 21 mg/dL (ref 8–23)
CO2: 17 mmol/L — ABNORMAL LOW (ref 22–32)
Calcium: 8.9 mg/dL (ref 8.9–10.3)
Chloride: 104 mmol/L (ref 98–111)
Creatinine, Ser: 0.81 mg/dL (ref 0.61–1.24)
GFR, Estimated: >60 mL/min (ref >60)
Glucose, Bld: 100 mg/dL — ABNORMAL HIGH (ref 70–99)
Potassium: 4.2 mmol/L (ref 3.5–5.1)
Sodium: 140 mmol/L (ref 135–145)

## 2024-08-27 SURGERY — CYSTOSCOPY, WITH BLADDER FULGURATION
Anesthesia: General | Site: Bladder

## 2024-08-27 MED ORDER — FENTANYL CITRATE (PF) 100 MCG/2ML IJ SOLN: 25.0000 ug | INTRAMUSCULAR | Status: DC | PRN

## 2024-08-27 MED ORDER — DEXAMETHASONE SOD PHOSPHATE PF 10 MG/ML IJ SOLN
INTRAMUSCULAR | Status: DC | PRN
  Administered 2024-08-27: 10 mg via INTRAVENOUS

## 2024-08-27 MED ORDER — CEFAZOLIN SODIUM-DEXTROSE 2-4 GM/100ML-% IV SOLN: 2.0000 g | INTRAVENOUS | Status: DC

## 2024-08-27 MED ORDER — ACETAMINOPHEN 10 MG/ML IV SOLN: 1000.0000 mg | Freq: Once | INTRAVENOUS | Status: DC | PRN

## 2024-08-27 MED ORDER — FENTANYL CITRATE (PF) 100 MCG/2ML IJ SOLN
INTRAMUSCULAR | Status: DC | PRN
  Administered 2024-08-27: 100 ug via INTRAVENOUS

## 2024-08-27 MED ORDER — ACETAMINOPHEN 325 MG PO TABS: 650.0000 mg | ORAL_TABLET | ORAL | Status: DC | PRN

## 2024-08-27 MED ORDER — ORAL CARE MOUTH RINSE: 15.0000 mL | Freq: Once | OROMUCOSAL | Status: AC

## 2024-08-27 MED ORDER — OXYCODONE HCL 5 MG PO TABS: 5.0000 mg | ORAL_TABLET | Freq: Once | ORAL | Status: DC | PRN

## 2024-08-27 MED ORDER — ACETAMINOPHEN 650 MG RE SUPP: 650.0000 mg | RECTAL | Status: DC | PRN

## 2024-08-27 MED ORDER — CEFAZOLIN SODIUM-DEXTROSE 2-4 GM/100ML-% IV SOLN
INTRAVENOUS | Status: DC
  Filled 2024-08-27: qty 100

## 2024-08-27 MED ORDER — ONDANSETRON HCL 4 MG/2ML IJ SOLN
INTRAMUSCULAR | Status: AC
  Filled 2024-08-27: qty 2

## 2024-08-27 MED ORDER — ONDANSETRON HCL 4 MG/2ML IJ SOLN: 4.0000 mg | Freq: Once | INTRAMUSCULAR | Status: DC | PRN

## 2024-08-27 MED ORDER — PHENYLEPHRINE 80 MCG/ML (10ML) SYRINGE FOR IV PUSH (FOR BLOOD PRESSURE SUPPORT)
PREFILLED_SYRINGE | INTRAVENOUS | Status: DC | PRN
  Administered 2024-08-27 (×2): 80 ug via INTRAVENOUS

## 2024-08-27 MED ORDER — MORPHINE SULFATE (PF) 2 MG/ML IV SOLN: 2.0000 mg | INTRAVENOUS | Status: DC | PRN

## 2024-08-27 MED ORDER — ROCURONIUM BROMIDE 100 MG/10ML IV SOLN
INTRAVENOUS | Status: DC | PRN
  Administered 2024-08-27: 60 mg via INTRAVENOUS

## 2024-08-27 MED ORDER — SUGAMMADEX SODIUM 200 MG/2ML IV SOLN
INTRAVENOUS | Status: DC | PRN
  Administered 2024-08-27: 200 mg via INTRAVENOUS

## 2024-08-27 MED ORDER — SODIUM CHLORIDE 0.9% FLUSH: 3.0000 mL | Freq: Two times a day (BID) | INTRAVENOUS | Status: DC

## 2024-08-27 MED ORDER — OXYCODONE HCL 5 MG/5ML PO SOLN: 5.0000 mg | Freq: Once | ORAL | Status: DC | PRN

## 2024-08-27 MED ORDER — 0.9 % SODIUM CHLORIDE (POUR BTL) OPTIME
TOPICAL | Status: DC | PRN
  Administered 2024-08-27: 1000 mL

## 2024-08-27 MED ORDER — LACTATED RINGERS IV SOLN: INTRAVENOUS | Status: DC | PRN

## 2024-08-27 MED ORDER — ONDANSETRON HCL 4 MG/2ML IJ SOLN
INTRAMUSCULAR | Status: DC | PRN
  Administered 2024-08-27: 4 mg via INTRAVENOUS

## 2024-08-27 MED ORDER — LIDOCAINE HCL (CARDIAC) PF 100 MG/5ML IV SOSY
PREFILLED_SYRINGE | INTRAVENOUS | Status: DC | PRN
  Administered 2024-08-27: 100 mg via INTRATRACHEAL

## 2024-08-27 MED ORDER — PROPOFOL 10 MG/ML IV BOLUS
INTRAVENOUS | Status: AC
  Filled 2024-08-27: qty 20

## 2024-08-27 MED ORDER — SODIUM CHLORIDE 0.9 % IR SOLN
Status: DC | PRN
  Administered 2024-08-27: 3000 mL via INTRAVESICAL

## 2024-08-27 MED ORDER — LACTATED RINGERS IV SOLN: INTRAVENOUS | Status: DC

## 2024-08-27 MED ORDER — SODIUM CHLORIDE 0.9% FLUSH: 3.0000 mL | INTRAVENOUS | Status: DC | PRN

## 2024-08-27 MED ORDER — CHLORHEXIDINE GLUCONATE 0.12 % MT SOLN: 15.0000 mL | Freq: Once | OROMUCOSAL | Status: AC

## 2024-08-27 MED ORDER — SODIUM CHLORIDE 0.9 % IV SOLN: 250.0000 mL | INTRAVENOUS | Status: DC | PRN

## 2024-08-27 MED ORDER — PROPOFOL 10 MG/ML IV BOLUS
INTRAVENOUS | Status: DC | PRN
  Administered 2024-08-27: 140 mg via INTRAVENOUS

## 2024-08-27 MED ORDER — OXYCODONE HCL 5 MG PO TABS: 5.0000 mg | ORAL_TABLET | ORAL | Status: DC | PRN

## 2024-08-27 MED ORDER — LIDOCAINE 2% (20 MG/ML) 5 ML SYRINGE
INTRAMUSCULAR | Status: AC
  Filled 2024-08-27: qty 5

## 2024-08-27 MED ORDER — FENTANYL CITRATE (PF) 100 MCG/2ML IJ SOLN
INTRAMUSCULAR | Status: AC
  Filled 2024-08-27: qty 2

## 2024-08-27 MED ORDER — CHLORHEXIDINE GLUCONATE 0.12 % MT SOLN
OROMUCOSAL | Status: AC
  Administered 2024-08-27: 15 mL via OROMUCOSAL
  Filled 2024-08-27: qty 15

## 2024-08-27 SURGICAL SUPPLY — 12 items
BAG DRAIN URO-CYSTO SKYTR STRL (DRAIN) ×1 IMPLANT
ELECTRODE REM PT RTRN 9FT ADLT (ELECTROSURGICAL) IMPLANT
GLOVE BIOGEL PI MICRO STRL 7 (GLOVE) IMPLANT
GLOVE SURG SS PI 8.0 STRL IVOR (GLOVE) ×1 IMPLANT
GOWN STRL REUS W/ TWL LRG LVL3 (GOWN DISPOSABLE) IMPLANT
GOWN STRL REUS W/ TWL XL LVL3 (GOWN DISPOSABLE) ×1 IMPLANT
KIT TURNOVER KIT B (KITS) ×1 IMPLANT
LOOP CUT BIPOLAR 24F LRG (ELECTROSURGICAL) ×1 IMPLANT
MANIFOLD NEPTUNE II (INSTRUMENTS) ×1 IMPLANT
PACK CYSTO (CUSTOM PROCEDURE TRAY) ×1 IMPLANT
SOLN 0.9% NACL POUR BTL 1000ML (IV SOLUTION) ×1 IMPLANT
TUBE CONNECTING 12X1/4 (SUCTIONS) ×1 IMPLANT

## 2024-08-27 NOTE — Anesthesia Procedure Notes (Signed)
 Procedure Name: Intubation Date/Time: 08/27/2024 9:51 AM  Performed by: Obadiah Reyes BROCKS, CRNAPre-anesthesia Checklist: Patient identified, Emergency Drugs available, Suction available and Patient being monitored Patient Re-evaluated:Patient Re-evaluated prior to induction Oxygen Delivery Method: Circle System Utilized Preoxygenation: Pre-oxygenation with 100% oxygen Induction Type: IV induction Ventilation: Mask ventilation without difficulty Laryngoscope Size: Miller and 2 Grade View: Grade I Tube type: Oral Tube size: 7.5 mm Number of attempts: 1 Airway Equipment and Method: Stylet and Oral airway Placement Confirmation: ETT inserted through vocal cords under direct vision, positive ETCO2 and breath sounds checked- equal and bilateral Secured at: 23 cm Tube secured with: Tape Dental Injury: Teeth and Oropharynx as per pre-operative assessment

## 2024-08-27 NOTE — Transfer of Care (Signed)
 Immediate Anesthesia Transfer of Care Note  Patient: Robert [name]  Procedure(s) Performed: CYSTOSCOPY, WITH BLADDER BIOPSY AND FULGURATION (Bladder)  Patient Location: PACU  Anesthesia Type:General  Level of Consciousness: awake, alert , and oriented  Airway & Oxygen Therapy: Patient Spontanous Breathing and Patient connected to nasal cannula oxygen  Post-op Assessment: Report given to RN and Post -op Vital signs reviewed and stable  Post vital signs: Reviewed and stable  Last Vitals:  Vitals Value Taken Time  BP 138/81 08/27/24 10:34  Temp 37.3 C 08/27/24 10:34  Pulse 63 08/27/24 10:40  Resp 12 08/27/24 10:40  SpO2 91 % 08/27/24 10:40  Vitals shown include unfiled device data.  Last Pain:  Vitals:   08/27/24 1034  TempSrc:   PainSc: 0-No pain      Patients Stated Pain Goal: 5 (08/27/24 0737)  Complications: No notable events documented.

## 2024-08-27 NOTE — Op Note (Signed)
 Procedure: 1.  Cystoscopy with bladder biopsy and fulguration of overlapping sites, 2.5 cm.  Pre-op diagnosis: History of hematuria with cytologic atypia and bladder wall lesions.  postop diagnosis: Same  Surgeon: Dr. Norleen Seltzer.  Anesthesia: General.  Specimen: Biopsies from the left trigone left bladder neck and left lateral wall.  Drains: None.  EBL: None.  Complications: None.  Indications: The patient is a 78 year old male was recently seen for gross hematuria in the office and was found to have some erythematous lesions on the left bladder wall that were concerning for carcinoma in situ versus inflammation.  He had cytologic dysplasia so it was felt the biopsy was indicated.  Procedure: He was taken the operating room was given 2 g of Ancef .  A general anesthetic was induced.  He was placed in lithotomy position and fitted with PAS hose.  His perineum and genitalia were prepped with Betadine  solution and draped usual sterile fashion.  Cystoscopy was performed using the 21 French scope and 30 degree lens.  Examination really normal urethra.  The external sphincter was intact.  The prostatic urethra was approximately 3 cm in length with lateral lobe hyperplasia and a moderate middle lobe.  He has a history of a prior TURP but I did not see clear evidence of that.  Examination of bladder revealed mild to moderate trabeculation.  On the left bladder neck and lateral wall surrounding what appeared to be an old scar and just above the left ureteral orifice in the trigone where erythematous areas that had been seen on cystoscopy.  The remainder of the bladder wall was unremarkable.  Ureteral orifices were unremarkable.  A cup biopsy forceps was then used to biopsy the areas on the trigone and the bladder neck and the lateral wall and then the continuous-flow resectoscope sheath was inserted with the aid of visual obturator and then fitted with an Faustine handle with a bipolar loop and 30  degree lens.  Saline remained irrigant.  The biopsy sites along with the surrounding erythematous mucosa were then generously fulgurated with the largest contiguous area approximately 2.5 cm in diameter.  Once all of the biopsy sites had been fulgurated along with surrounding area, final inspection revealed no active bleeding and no bladder wall perforation.  I did fulgurate a couple of oozing vessels on the prostate middle lobe as well.  It was felt a Foley catheter was not indicated.  The bladder was drained and the cystoscope was removed.  He was taken down from lithotomy position, his anesthetic was reversed and he was moved recovery in stable condition.  There were no complications.

## 2024-08-27 NOTE — Anesthesia Preprocedure Evaluation (Addendum)
 Anesthesia Evaluation  Patient identified by MRN, date of birth, ID band Patient awake    Reviewed: Allergy & Precautions, NPO status , Patient's Chart, lab work & pertinent test results, reviewed documented beta blocker date and time   History of Anesthesia Complications Negative for: history of anesthetic complications  Airway Mallampati: IV       Dental  (+) Missing,    Pulmonary asthma , sleep apnea , neg COPD, former smoker   breath sounds clear to auscultation       Cardiovascular hypertension, + angina  + CAD, + Past MI, + Cardiac Stents and + CABG   Rhythm:Regular Rate:Normal  IMPRESSIONS     1. Left ventricular ejection fraction, by estimation, is 55 to 60%. The  left ventricle has normal function. The left ventricle has no regional  wall motion abnormalities. Left ventricular diastolic parameters are  consistent with Grade I diastolic  dysfunction (impaired relaxation).   2. Right ventricular systolic function is normal. The right ventricular  size is normal.   3. Left atrial size was severely dilated.   4. Right atrial size was mildly dilated.   5. The mitral valve is normal in structure. Mild mitral valve  regurgitation. No evidence of mitral stenosis.   6. The aortic valve is tricuspid. Aortic valve regurgitation is not  visualized. No aortic stenosis is present.   7. There is mild dilatation of the aortic root, measuring 41 mm. There is  mild dilatation of the ascending aorta, measuring 42 mm.   8. The inferior vena cava is normal in size with greater than 50%  respiratory variability, suggesting right atrial pressure of 3 mmHg.     Neuro/Psych neg Seizures    GI/Hepatic ,GERD  ,,(+) neg Cirrhosis        Endo/Other    Renal/GU Renal disease     Musculoskeletal  (+) Arthritis , Osteoarthritis,    Abdominal   Peds  Hematology  (+) Blood dyscrasia, anemia   Anesthesia Other Findings    Reproductive/Obstetrics                              Anesthesia Physical Anesthesia Plan  ASA: 3  Anesthesia Plan: General   Post-op Pain Management:    Induction: Intravenous  PONV Risk Score and Plan: 2 and Ondansetron  and Dexamethasone   Airway Management Planned: Oral ETT  Additional Equipment:   Intra-op Plan:   Post-operative Plan: Extubation in OR  Informed Consent: I have reviewed the patients History and Physical, chart, labs and discussed the procedure including the risks, benefits and alternatives for the proposed anesthesia with the patient or authorized representative who has indicated his/her understanding and acceptance.     Dental advisory given  Plan Discussed with: CRNA  Anesthesia Plan Comments:          Anesthesia Quick Evaluation

## 2024-08-27 NOTE — Interval H&P Note (Signed)
 History and Physical Interval Note: No change  08/27/2024 9:04 AM  Robert [name]  has presented today for surgery, with the diagnosis of GROSS HEMATURIA.  The various methods of treatment have been discussed with the patient and family. After consideration of risks, benefits and other options for treatment, the patient has consented to  Procedure(s): CYSTOSCOPY, WITH BLADDER FULGURATION (N/A) TURBT (TRANSURETHRAL RESECTION OF BLADDER TUMOR) (N/A) as a surgical intervention.  The patient's history has been reviewed, patient examined, no change in status, stable for surgery.  I have reviewed the patient's chart and labs.  Questions were answered to the patient's satisfaction.     [name]

## 2024-08-28 ENCOUNTER — Encounter (HOSPITAL_COMMUNITY): Payer: Self-pay | Admitting: Urology

## 2024-08-28 LAB — SURGICAL PATHOLOGY

## 2024-08-28 MED FILL — Cefazolin Sodium-Dextrose IV Solution 2 GM/100ML-4%: INTRAVENOUS | Qty: 100 | Status: AC

## 2024-08-28 NOTE — Anesthesia Postprocedure Evaluation (Signed)
 Anesthesia Post Note  Patient: Robert [name]  Procedure(s) Performed: CYSTOSCOPY, WITH BLADDER BIOPSY AND FULGURATION (Bladder)     Patient location during evaluation: PACU Anesthesia Type: General Level of consciousness: awake and alert Pain management: pain level controlled Vital Signs Assessment: post-procedure vital signs reviewed and stable Respiratory status: spontaneous breathing, nonlabored ventilation, respiratory function stable and patient connected to nasal cannula oxygen Cardiovascular status: blood pressure returned to baseline and stable Postop Assessment: no apparent nausea or vomiting Anesthetic complications: no   No notable events documented.  Last Vitals:  Vitals:   08/27/24 1100 08/27/24 1115  BP: (!) 148/86 132/85  Pulse: 69 63  Resp: 18 16  Temp:  36.9 C  SpO2: 93% 93%    Last Pain:  Vitals:   08/27/24 1100  TempSrc:   PainSc: 0-No pain                 Lynwood MARLA Cornea

## 2024-08-30 ENCOUNTER — Ambulatory Visit (HOSPITAL_COMMUNITY)
Admission: RE | Admit: 2024-08-30 | Discharge: 2024-08-30 | Disposition: A | Source: Ambulatory Visit | Attending: Physician Assistant | Admitting: Physician Assistant

## 2024-08-30 ENCOUNTER — Encounter (HOSPITAL_COMMUNITY): Payer: Self-pay

## 2024-08-30 DIAGNOSIS — I77810 Thoracic aortic ectasia: Secondary | ICD-10-CM

## 2024-08-30 MED ORDER — IOHEXOL 350 MG/ML SOLN
75.0000 mL | Freq: Once | INTRAVENOUS | Status: AC | PRN
  Administered 2024-08-30: 75 mL via INTRAVENOUS

## 2024-08-30 MED ORDER — SODIUM CHLORIDE (PF) 0.9 % IJ SOLN
INTRAMUSCULAR | Status: AC
  Filled 2024-08-30: qty 50

## 2024-10-14 ENCOUNTER — Other Ambulatory Visit: Payer: Self-pay | Admitting: Internal Medicine

## 2024-10-15 ENCOUNTER — Other Ambulatory Visit: Payer: Self-pay

## 2025-01-13 ENCOUNTER — Ambulatory Visit
